# Patient Record
Sex: Male | Born: 1955 | Race: White | Hispanic: No | Marital: Single | Smoking: Never smoker
Health system: Southern US, Community
[De-identification: ages and names within clinical notes are randomized; demographics above are authoritative.]

## PROBLEM LIST (undated history)

## (undated) DIAGNOSIS — F39 Unspecified mood [affective] disorder: Secondary | ICD-10-CM

## (undated) DIAGNOSIS — F32A Depression, unspecified: Secondary | ICD-10-CM

## (undated) DIAGNOSIS — G809 Cerebral palsy, unspecified: Secondary | ICD-10-CM

## (undated) DIAGNOSIS — N319 Neuromuscular dysfunction of bladder, unspecified: Secondary | ICD-10-CM

## (undated) DIAGNOSIS — R32 Unspecified urinary incontinence: Secondary | ICD-10-CM

## (undated) DIAGNOSIS — Z86718 Personal history of other venous thrombosis and embolism: Secondary | ICD-10-CM

## (undated) DIAGNOSIS — F329 Major depressive disorder, single episode, unspecified: Secondary | ICD-10-CM

## (undated) DIAGNOSIS — K219 Gastro-esophageal reflux disease without esophagitis: Secondary | ICD-10-CM

## (undated) HISTORY — DX: Neuromuscular dysfunction of bladder, unspecified: N31.9

## (undated) HISTORY — DX: Depression, unspecified: F32.A

## (undated) HISTORY — DX: Unspecified urinary incontinence: R32

## (undated) HISTORY — DX: Major depressive disorder, single episode, unspecified: F32.9

## (undated) HISTORY — DX: Unspecified mood (affective) disorder: F39

## (undated) HISTORY — DX: Gastro-esophageal reflux disease without esophagitis: K21.9

## (undated) HISTORY — DX: Personal history of other venous thrombosis and embolism: Z86.718

---

## 2004-09-08 ENCOUNTER — Ambulatory Visit: Payer: Self-pay

## 2009-06-17 ENCOUNTER — Inpatient Hospital Stay: Payer: Self-pay | Admitting: Urology

## 2009-10-09 ENCOUNTER — Ambulatory Visit: Payer: Self-pay | Admitting: Urology

## 2010-02-03 ENCOUNTER — Ambulatory Visit: Payer: Self-pay | Admitting: Urology

## 2010-07-20 ENCOUNTER — Emergency Department: Payer: Self-pay | Admitting: Emergency Medicine

## 2010-08-06 ENCOUNTER — Observation Stay: Payer: Self-pay | Admitting: Internal Medicine

## 2010-08-17 ENCOUNTER — Inpatient Hospital Stay: Payer: Self-pay | Admitting: Orthopedic Surgery

## 2010-10-06 ENCOUNTER — Ambulatory Visit: Payer: Self-pay | Admitting: Vascular Surgery

## 2010-10-07 ENCOUNTER — Inpatient Hospital Stay: Payer: Self-pay | Admitting: Orthopedic Surgery

## 2011-05-06 ENCOUNTER — Ambulatory Visit: Payer: Self-pay | Admitting: Vascular Surgery

## 2011-05-20 ENCOUNTER — Emergency Department: Payer: Self-pay | Admitting: Emergency Medicine

## 2011-05-23 ENCOUNTER — Ambulatory Visit: Payer: Self-pay | Admitting: Urology

## 2011-05-23 ENCOUNTER — Inpatient Hospital Stay: Payer: Self-pay | Admitting: Internal Medicine

## 2011-05-26 HISTORY — PX: OTHER SURGICAL HISTORY: SHX169

## 2011-05-26 HISTORY — PX: HIP FRACTURE SURGERY: SHX118

## 2011-05-27 DIAGNOSIS — M542 Cervicalgia: Secondary | ICD-10-CM | POA: Diagnosis not present

## 2011-05-27 DIAGNOSIS — G809 Cerebral palsy, unspecified: Secondary | ICD-10-CM | POA: Diagnosis not present

## 2011-05-27 DIAGNOSIS — M47812 Spondylosis without myelopathy or radiculopathy, cervical region: Secondary | ICD-10-CM | POA: Diagnosis not present

## 2011-05-27 DIAGNOSIS — Z8672 Personal history of thrombophlebitis: Secondary | ICD-10-CM | POA: Diagnosis not present

## 2011-05-27 DIAGNOSIS — L8992 Pressure ulcer of unspecified site, stage 2: Secondary | ICD-10-CM | POA: Diagnosis not present

## 2011-05-27 DIAGNOSIS — L89309 Pressure ulcer of unspecified buttock, unspecified stage: Secondary | ICD-10-CM | POA: Diagnosis not present

## 2011-06-02 DIAGNOSIS — G809 Cerebral palsy, unspecified: Secondary | ICD-10-CM | POA: Diagnosis not present

## 2011-06-02 DIAGNOSIS — M62838 Other muscle spasm: Secondary | ICD-10-CM | POA: Diagnosis not present

## 2011-06-02 DIAGNOSIS — Z8672 Personal history of thrombophlebitis: Secondary | ICD-10-CM | POA: Diagnosis not present

## 2011-06-03 DIAGNOSIS — G809 Cerebral palsy, unspecified: Secondary | ICD-10-CM | POA: Diagnosis not present

## 2011-06-03 DIAGNOSIS — L8992 Pressure ulcer of unspecified site, stage 2: Secondary | ICD-10-CM | POA: Diagnosis not present

## 2011-06-03 DIAGNOSIS — Z8672 Personal history of thrombophlebitis: Secondary | ICD-10-CM | POA: Diagnosis not present

## 2011-06-03 DIAGNOSIS — M542 Cervicalgia: Secondary | ICD-10-CM | POA: Diagnosis not present

## 2011-06-03 DIAGNOSIS — M47812 Spondylosis without myelopathy or radiculopathy, cervical region: Secondary | ICD-10-CM | POA: Diagnosis not present

## 2011-06-03 DIAGNOSIS — L89309 Pressure ulcer of unspecified buttock, unspecified stage: Secondary | ICD-10-CM | POA: Diagnosis not present

## 2011-06-04 DIAGNOSIS — I82409 Acute embolism and thrombosis of unspecified deep veins of unspecified lower extremity: Secondary | ICD-10-CM | POA: Diagnosis not present

## 2011-06-04 DIAGNOSIS — M79609 Pain in unspecified limb: Secondary | ICD-10-CM | POA: Diagnosis not present

## 2011-06-05 DIAGNOSIS — L89309 Pressure ulcer of unspecified buttock, unspecified stage: Secondary | ICD-10-CM | POA: Diagnosis not present

## 2011-06-05 DIAGNOSIS — L8992 Pressure ulcer of unspecified site, stage 2: Secondary | ICD-10-CM | POA: Diagnosis not present

## 2011-06-05 DIAGNOSIS — M542 Cervicalgia: Secondary | ICD-10-CM | POA: Diagnosis not present

## 2011-06-05 DIAGNOSIS — G809 Cerebral palsy, unspecified: Secondary | ICD-10-CM | POA: Diagnosis not present

## 2011-06-05 DIAGNOSIS — M47812 Spondylosis without myelopathy or radiculopathy, cervical region: Secondary | ICD-10-CM | POA: Diagnosis not present

## 2011-06-05 DIAGNOSIS — Z8672 Personal history of thrombophlebitis: Secondary | ICD-10-CM | POA: Diagnosis not present

## 2011-06-09 DIAGNOSIS — M542 Cervicalgia: Secondary | ICD-10-CM | POA: Diagnosis not present

## 2011-06-09 DIAGNOSIS — L89309 Pressure ulcer of unspecified buttock, unspecified stage: Secondary | ICD-10-CM | POA: Diagnosis not present

## 2011-06-09 DIAGNOSIS — Z8672 Personal history of thrombophlebitis: Secondary | ICD-10-CM | POA: Diagnosis not present

## 2011-06-09 DIAGNOSIS — L8992 Pressure ulcer of unspecified site, stage 2: Secondary | ICD-10-CM | POA: Diagnosis not present

## 2011-06-09 DIAGNOSIS — G809 Cerebral palsy, unspecified: Secondary | ICD-10-CM | POA: Diagnosis not present

## 2011-06-09 DIAGNOSIS — M47812 Spondylosis without myelopathy or radiculopathy, cervical region: Secondary | ICD-10-CM | POA: Diagnosis not present

## 2011-06-10 ENCOUNTER — Ambulatory Visit: Payer: Self-pay | Admitting: Vascular Surgery

## 2011-06-10 DIAGNOSIS — M5030 Other cervical disc degeneration, unspecified cervical region: Secondary | ICD-10-CM | POA: Diagnosis not present

## 2011-06-11 DIAGNOSIS — L89309 Pressure ulcer of unspecified buttock, unspecified stage: Secondary | ICD-10-CM | POA: Diagnosis not present

## 2011-06-11 DIAGNOSIS — M542 Cervicalgia: Secondary | ICD-10-CM | POA: Diagnosis not present

## 2011-06-11 DIAGNOSIS — M47812 Spondylosis without myelopathy or radiculopathy, cervical region: Secondary | ICD-10-CM | POA: Diagnosis not present

## 2011-06-11 DIAGNOSIS — Z8672 Personal history of thrombophlebitis: Secondary | ICD-10-CM | POA: Diagnosis not present

## 2011-06-11 DIAGNOSIS — L8992 Pressure ulcer of unspecified site, stage 2: Secondary | ICD-10-CM | POA: Diagnosis not present

## 2011-06-11 DIAGNOSIS — G809 Cerebral palsy, unspecified: Secondary | ICD-10-CM | POA: Diagnosis not present

## 2011-06-15 DIAGNOSIS — M542 Cervicalgia: Secondary | ICD-10-CM | POA: Diagnosis not present

## 2011-06-15 DIAGNOSIS — L89309 Pressure ulcer of unspecified buttock, unspecified stage: Secondary | ICD-10-CM | POA: Diagnosis not present

## 2011-06-15 DIAGNOSIS — L8992 Pressure ulcer of unspecified site, stage 2: Secondary | ICD-10-CM | POA: Diagnosis not present

## 2011-06-15 DIAGNOSIS — M47812 Spondylosis without myelopathy or radiculopathy, cervical region: Secondary | ICD-10-CM | POA: Diagnosis not present

## 2011-06-15 DIAGNOSIS — Z8672 Personal history of thrombophlebitis: Secondary | ICD-10-CM | POA: Diagnosis not present

## 2011-06-15 DIAGNOSIS — G809 Cerebral palsy, unspecified: Secondary | ICD-10-CM | POA: Diagnosis not present

## 2011-06-17 DIAGNOSIS — Z8672 Personal history of thrombophlebitis: Secondary | ICD-10-CM | POA: Diagnosis not present

## 2011-06-17 DIAGNOSIS — M47812 Spondylosis without myelopathy or radiculopathy, cervical region: Secondary | ICD-10-CM | POA: Diagnosis not present

## 2011-06-17 DIAGNOSIS — M542 Cervicalgia: Secondary | ICD-10-CM | POA: Diagnosis not present

## 2011-06-17 DIAGNOSIS — G809 Cerebral palsy, unspecified: Secondary | ICD-10-CM | POA: Diagnosis not present

## 2011-06-17 DIAGNOSIS — L8992 Pressure ulcer of unspecified site, stage 2: Secondary | ICD-10-CM | POA: Diagnosis not present

## 2011-06-17 DIAGNOSIS — L89309 Pressure ulcer of unspecified buttock, unspecified stage: Secondary | ICD-10-CM | POA: Diagnosis not present

## 2011-06-19 DIAGNOSIS — M47812 Spondylosis without myelopathy or radiculopathy, cervical region: Secondary | ICD-10-CM | POA: Diagnosis not present

## 2011-06-19 DIAGNOSIS — G809 Cerebral palsy, unspecified: Secondary | ICD-10-CM | POA: Diagnosis not present

## 2011-06-19 DIAGNOSIS — L89309 Pressure ulcer of unspecified buttock, unspecified stage: Secondary | ICD-10-CM | POA: Diagnosis not present

## 2011-06-19 DIAGNOSIS — Z8672 Personal history of thrombophlebitis: Secondary | ICD-10-CM | POA: Diagnosis not present

## 2011-06-19 DIAGNOSIS — L8992 Pressure ulcer of unspecified site, stage 2: Secondary | ICD-10-CM | POA: Diagnosis not present

## 2011-06-19 DIAGNOSIS — M542 Cervicalgia: Secondary | ICD-10-CM | POA: Diagnosis not present

## 2011-06-23 DIAGNOSIS — Z8672 Personal history of thrombophlebitis: Secondary | ICD-10-CM | POA: Diagnosis not present

## 2011-06-23 DIAGNOSIS — M542 Cervicalgia: Secondary | ICD-10-CM | POA: Diagnosis not present

## 2011-06-23 DIAGNOSIS — L89309 Pressure ulcer of unspecified buttock, unspecified stage: Secondary | ICD-10-CM | POA: Diagnosis not present

## 2011-06-23 DIAGNOSIS — L8992 Pressure ulcer of unspecified site, stage 2: Secondary | ICD-10-CM | POA: Diagnosis not present

## 2011-06-23 DIAGNOSIS — G809 Cerebral palsy, unspecified: Secondary | ICD-10-CM | POA: Diagnosis not present

## 2011-06-23 DIAGNOSIS — M47812 Spondylosis without myelopathy or radiculopathy, cervical region: Secondary | ICD-10-CM | POA: Diagnosis not present

## 2011-06-24 DIAGNOSIS — G809 Cerebral palsy, unspecified: Secondary | ICD-10-CM | POA: Diagnosis not present

## 2011-06-24 DIAGNOSIS — L89309 Pressure ulcer of unspecified buttock, unspecified stage: Secondary | ICD-10-CM | POA: Diagnosis not present

## 2011-06-24 DIAGNOSIS — Z8672 Personal history of thrombophlebitis: Secondary | ICD-10-CM | POA: Diagnosis not present

## 2011-06-24 DIAGNOSIS — M47812 Spondylosis without myelopathy or radiculopathy, cervical region: Secondary | ICD-10-CM | POA: Diagnosis not present

## 2011-06-24 DIAGNOSIS — L8992 Pressure ulcer of unspecified site, stage 2: Secondary | ICD-10-CM | POA: Diagnosis not present

## 2011-06-24 DIAGNOSIS — M542 Cervicalgia: Secondary | ICD-10-CM | POA: Diagnosis not present

## 2011-06-26 DIAGNOSIS — Z8672 Personal history of thrombophlebitis: Secondary | ICD-10-CM | POA: Diagnosis not present

## 2011-06-26 DIAGNOSIS — L89309 Pressure ulcer of unspecified buttock, unspecified stage: Secondary | ICD-10-CM | POA: Diagnosis not present

## 2011-06-26 DIAGNOSIS — L8992 Pressure ulcer of unspecified site, stage 2: Secondary | ICD-10-CM | POA: Diagnosis not present

## 2011-06-26 DIAGNOSIS — G809 Cerebral palsy, unspecified: Secondary | ICD-10-CM | POA: Diagnosis not present

## 2011-06-26 DIAGNOSIS — M47812 Spondylosis without myelopathy or radiculopathy, cervical region: Secondary | ICD-10-CM | POA: Diagnosis not present

## 2011-06-26 DIAGNOSIS — M542 Cervicalgia: Secondary | ICD-10-CM | POA: Diagnosis not present

## 2011-06-29 DIAGNOSIS — Z8672 Personal history of thrombophlebitis: Secondary | ICD-10-CM | POA: Diagnosis not present

## 2011-06-29 DIAGNOSIS — M542 Cervicalgia: Secondary | ICD-10-CM | POA: Diagnosis not present

## 2011-06-29 DIAGNOSIS — M47812 Spondylosis without myelopathy or radiculopathy, cervical region: Secondary | ICD-10-CM | POA: Diagnosis not present

## 2011-06-29 DIAGNOSIS — L89309 Pressure ulcer of unspecified buttock, unspecified stage: Secondary | ICD-10-CM | POA: Diagnosis not present

## 2011-06-29 DIAGNOSIS — L8992 Pressure ulcer of unspecified site, stage 2: Secondary | ICD-10-CM | POA: Diagnosis not present

## 2011-06-29 DIAGNOSIS — G809 Cerebral palsy, unspecified: Secondary | ICD-10-CM | POA: Diagnosis not present

## 2011-06-30 DIAGNOSIS — L89309 Pressure ulcer of unspecified buttock, unspecified stage: Secondary | ICD-10-CM | POA: Diagnosis not present

## 2011-06-30 DIAGNOSIS — G809 Cerebral palsy, unspecified: Secondary | ICD-10-CM | POA: Diagnosis not present

## 2011-06-30 DIAGNOSIS — F39 Unspecified mood [affective] disorder: Secondary | ICD-10-CM | POA: Diagnosis not present

## 2011-06-30 DIAGNOSIS — K5900 Constipation, unspecified: Secondary | ICD-10-CM | POA: Diagnosis not present

## 2011-06-30 DIAGNOSIS — M25519 Pain in unspecified shoulder: Secondary | ICD-10-CM | POA: Diagnosis not present

## 2011-06-30 DIAGNOSIS — Z8672 Personal history of thrombophlebitis: Secondary | ICD-10-CM | POA: Diagnosis not present

## 2011-06-30 DIAGNOSIS — L8992 Pressure ulcer of unspecified site, stage 2: Secondary | ICD-10-CM | POA: Diagnosis not present

## 2011-06-30 DIAGNOSIS — M542 Cervicalgia: Secondary | ICD-10-CM | POA: Diagnosis not present

## 2011-06-30 DIAGNOSIS — M47812 Spondylosis without myelopathy or radiculopathy, cervical region: Secondary | ICD-10-CM | POA: Diagnosis not present

## 2011-07-01 DIAGNOSIS — Z8672 Personal history of thrombophlebitis: Secondary | ICD-10-CM | POA: Diagnosis not present

## 2011-07-01 DIAGNOSIS — L89309 Pressure ulcer of unspecified buttock, unspecified stage: Secondary | ICD-10-CM | POA: Diagnosis not present

## 2011-07-01 DIAGNOSIS — M47812 Spondylosis without myelopathy or radiculopathy, cervical region: Secondary | ICD-10-CM | POA: Diagnosis not present

## 2011-07-01 DIAGNOSIS — L8992 Pressure ulcer of unspecified site, stage 2: Secondary | ICD-10-CM | POA: Diagnosis not present

## 2011-07-01 DIAGNOSIS — G809 Cerebral palsy, unspecified: Secondary | ICD-10-CM | POA: Diagnosis not present

## 2011-07-01 DIAGNOSIS — M542 Cervicalgia: Secondary | ICD-10-CM | POA: Diagnosis not present

## 2011-07-16 DIAGNOSIS — N390 Urinary tract infection, site not specified: Secondary | ICD-10-CM | POA: Diagnosis not present

## 2011-09-29 DIAGNOSIS — K5900 Constipation, unspecified: Secondary | ICD-10-CM | POA: Diagnosis not present

## 2011-09-29 DIAGNOSIS — G809 Cerebral palsy, unspecified: Secondary | ICD-10-CM | POA: Diagnosis not present

## 2011-09-29 DIAGNOSIS — M62838 Other muscle spasm: Secondary | ICD-10-CM | POA: Diagnosis not present

## 2011-09-29 DIAGNOSIS — K029 Dental caries, unspecified: Secondary | ICD-10-CM | POA: Diagnosis not present

## 2011-11-04 DIAGNOSIS — N319 Neuromuscular dysfunction of bladder, unspecified: Secondary | ICD-10-CM | POA: Diagnosis not present

## 2012-01-11 ENCOUNTER — Emergency Department: Payer: Self-pay | Admitting: Emergency Medicine

## 2012-01-11 DIAGNOSIS — R509 Fever, unspecified: Secondary | ICD-10-CM | POA: Diagnosis not present

## 2012-01-11 DIAGNOSIS — N390 Urinary tract infection, site not specified: Secondary | ICD-10-CM | POA: Diagnosis not present

## 2012-01-11 DIAGNOSIS — R6889 Other general symptoms and signs: Secondary | ICD-10-CM | POA: Diagnosis not present

## 2012-01-11 DIAGNOSIS — R000 Tachycardia, unspecified: Secondary | ICD-10-CM | POA: Diagnosis not present

## 2012-01-11 LAB — CBC WITH DIFFERENTIAL/PLATELET
Basophil #: 0 10*3/uL (ref 0.0–0.1)
Basophil %: 0.4 %
Eosinophil #: 0 10*3/uL (ref 0.0–0.7)
HCT: 36.3 % — ABNORMAL LOW (ref 40.0–52.0)
Lymphocyte %: 7.3 %
MCHC: 33.6 g/dL (ref 32.0–36.0)
MCV: 88 fL (ref 80–100)
Monocyte #: 0.4 x10 3/mm (ref 0.2–1.0)
Monocyte %: 4 %
Neutrophil #: 8.9 10*3/uL — ABNORMAL HIGH (ref 1.4–6.5)
Neutrophil %: 88.2 %
RDW: 14 % (ref 11.5–14.5)

## 2012-01-11 LAB — COMPREHENSIVE METABOLIC PANEL
Albumin: 3.4 g/dL (ref 3.4–5.0)
Anion Gap: 8 (ref 7–16)
BUN: 20 mg/dL — ABNORMAL HIGH (ref 7–18)
Calcium, Total: 8.4 mg/dL — ABNORMAL LOW (ref 8.5–10.1)
Chloride: 106 mmol/L (ref 98–107)
Creatinine: 0.62 mg/dL (ref 0.60–1.30)
EGFR (African American): >60
Glucose: 120 mg/dL — ABNORMAL HIGH (ref 65–99)
Osmolality: 285 (ref 275–301)
Potassium: 3.9 mmol/L (ref 3.5–5.1)
Sodium: 141 mmol/L (ref 136–145)
Total Protein: 7 g/dL (ref 6.4–8.2)

## 2012-01-11 LAB — URINALYSIS, COMPLETE
Blood: NEGATIVE
Nitrite: POSITIVE
Ph: 5 (ref 4.5–8.0)
Protein: NEGATIVE
Specific Gravity: 1.017 (ref 1.003–1.030)
Squamous Epithelial: 4
WBC UR: 122 /HPF (ref 0–5)

## 2012-01-11 LAB — LIPASE, BLOOD: Lipase: 99 U/L (ref 73–393)

## 2012-01-13 DIAGNOSIS — Z8672 Personal history of thrombophlebitis: Secondary | ICD-10-CM | POA: Diagnosis not present

## 2012-01-13 DIAGNOSIS — G808 Other cerebral palsy: Secondary | ICD-10-CM | POA: Diagnosis not present

## 2012-01-13 DIAGNOSIS — I82509 Chronic embolism and thrombosis of unspecified deep veins of unspecified lower extremity: Secondary | ICD-10-CM | POA: Diagnosis not present

## 2012-01-13 DIAGNOSIS — F39 Unspecified mood [affective] disorder: Secondary | ICD-10-CM | POA: Diagnosis not present

## 2012-01-13 DIAGNOSIS — N390 Urinary tract infection, site not specified: Secondary | ICD-10-CM | POA: Diagnosis not present

## 2012-01-18 DIAGNOSIS — M24559 Contracture, unspecified hip: Secondary | ICD-10-CM | POA: Diagnosis not present

## 2012-01-18 DIAGNOSIS — M24569 Contracture, unspecified knee: Secondary | ICD-10-CM | POA: Diagnosis not present

## 2012-01-18 DIAGNOSIS — L89309 Pressure ulcer of unspecified buttock, unspecified stage: Secondary | ICD-10-CM | POA: Diagnosis not present

## 2012-01-18 DIAGNOSIS — N390 Urinary tract infection, site not specified: Secondary | ICD-10-CM | POA: Diagnosis not present

## 2012-01-18 DIAGNOSIS — A499 Bacterial infection, unspecified: Secondary | ICD-10-CM | POA: Diagnosis not present

## 2012-01-18 DIAGNOSIS — L8992 Pressure ulcer of unspecified site, stage 2: Secondary | ICD-10-CM | POA: Diagnosis not present

## 2012-01-22 DIAGNOSIS — L89309 Pressure ulcer of unspecified buttock, unspecified stage: Secondary | ICD-10-CM | POA: Diagnosis not present

## 2012-01-22 DIAGNOSIS — L8992 Pressure ulcer of unspecified site, stage 2: Secondary | ICD-10-CM | POA: Diagnosis not present

## 2012-01-22 DIAGNOSIS — N390 Urinary tract infection, site not specified: Secondary | ICD-10-CM | POA: Diagnosis not present

## 2012-01-22 DIAGNOSIS — M24569 Contracture, unspecified knee: Secondary | ICD-10-CM | POA: Diagnosis not present

## 2012-01-22 DIAGNOSIS — A499 Bacterial infection, unspecified: Secondary | ICD-10-CM | POA: Diagnosis not present

## 2012-01-22 DIAGNOSIS — M24559 Contracture, unspecified hip: Secondary | ICD-10-CM | POA: Diagnosis not present

## 2012-01-26 DIAGNOSIS — M24569 Contracture, unspecified knee: Secondary | ICD-10-CM | POA: Diagnosis not present

## 2012-01-26 DIAGNOSIS — L89309 Pressure ulcer of unspecified buttock, unspecified stage: Secondary | ICD-10-CM | POA: Diagnosis not present

## 2012-01-26 DIAGNOSIS — M24559 Contracture, unspecified hip: Secondary | ICD-10-CM | POA: Diagnosis not present

## 2012-01-26 DIAGNOSIS — A499 Bacterial infection, unspecified: Secondary | ICD-10-CM | POA: Diagnosis not present

## 2012-01-26 DIAGNOSIS — N390 Urinary tract infection, site not specified: Secondary | ICD-10-CM | POA: Diagnosis not present

## 2012-01-26 DIAGNOSIS — L8992 Pressure ulcer of unspecified site, stage 2: Secondary | ICD-10-CM | POA: Diagnosis not present

## 2012-02-01 DIAGNOSIS — A499 Bacterial infection, unspecified: Secondary | ICD-10-CM | POA: Diagnosis not present

## 2012-02-01 DIAGNOSIS — M24569 Contracture, unspecified knee: Secondary | ICD-10-CM | POA: Diagnosis not present

## 2012-02-01 DIAGNOSIS — L89309 Pressure ulcer of unspecified buttock, unspecified stage: Secondary | ICD-10-CM | POA: Diagnosis not present

## 2012-02-01 DIAGNOSIS — L8992 Pressure ulcer of unspecified site, stage 2: Secondary | ICD-10-CM | POA: Diagnosis not present

## 2012-02-01 DIAGNOSIS — N390 Urinary tract infection, site not specified: Secondary | ICD-10-CM | POA: Diagnosis not present

## 2012-02-01 DIAGNOSIS — M24559 Contracture, unspecified hip: Secondary | ICD-10-CM | POA: Diagnosis not present

## 2012-02-03 DIAGNOSIS — M24569 Contracture, unspecified knee: Secondary | ICD-10-CM | POA: Diagnosis not present

## 2012-02-03 DIAGNOSIS — L8992 Pressure ulcer of unspecified site, stage 2: Secondary | ICD-10-CM | POA: Diagnosis not present

## 2012-02-03 DIAGNOSIS — A499 Bacterial infection, unspecified: Secondary | ICD-10-CM | POA: Diagnosis not present

## 2012-02-03 DIAGNOSIS — M24559 Contracture, unspecified hip: Secondary | ICD-10-CM | POA: Diagnosis not present

## 2012-02-03 DIAGNOSIS — L89309 Pressure ulcer of unspecified buttock, unspecified stage: Secondary | ICD-10-CM | POA: Diagnosis not present

## 2012-02-03 DIAGNOSIS — N390 Urinary tract infection, site not specified: Secondary | ICD-10-CM | POA: Diagnosis not present

## 2012-02-05 DIAGNOSIS — L89309 Pressure ulcer of unspecified buttock, unspecified stage: Secondary | ICD-10-CM | POA: Diagnosis not present

## 2012-02-05 DIAGNOSIS — A499 Bacterial infection, unspecified: Secondary | ICD-10-CM | POA: Diagnosis not present

## 2012-02-05 DIAGNOSIS — M24559 Contracture, unspecified hip: Secondary | ICD-10-CM | POA: Diagnosis not present

## 2012-02-05 DIAGNOSIS — L8992 Pressure ulcer of unspecified site, stage 2: Secondary | ICD-10-CM | POA: Diagnosis not present

## 2012-02-05 DIAGNOSIS — M24569 Contracture, unspecified knee: Secondary | ICD-10-CM | POA: Diagnosis not present

## 2012-02-05 DIAGNOSIS — N390 Urinary tract infection, site not specified: Secondary | ICD-10-CM | POA: Diagnosis not present

## 2012-02-08 DIAGNOSIS — M24569 Contracture, unspecified knee: Secondary | ICD-10-CM | POA: Diagnosis not present

## 2012-02-08 DIAGNOSIS — A499 Bacterial infection, unspecified: Secondary | ICD-10-CM | POA: Diagnosis not present

## 2012-02-08 DIAGNOSIS — L89309 Pressure ulcer of unspecified buttock, unspecified stage: Secondary | ICD-10-CM | POA: Diagnosis not present

## 2012-02-08 DIAGNOSIS — L8992 Pressure ulcer of unspecified site, stage 2: Secondary | ICD-10-CM | POA: Diagnosis not present

## 2012-02-08 DIAGNOSIS — M24559 Contracture, unspecified hip: Secondary | ICD-10-CM | POA: Diagnosis not present

## 2012-02-08 DIAGNOSIS — N390 Urinary tract infection, site not specified: Secondary | ICD-10-CM | POA: Diagnosis not present

## 2012-02-10 DIAGNOSIS — M24559 Contracture, unspecified hip: Secondary | ICD-10-CM | POA: Diagnosis not present

## 2012-02-10 DIAGNOSIS — A499 Bacterial infection, unspecified: Secondary | ICD-10-CM | POA: Diagnosis not present

## 2012-02-10 DIAGNOSIS — L89309 Pressure ulcer of unspecified buttock, unspecified stage: Secondary | ICD-10-CM | POA: Diagnosis not present

## 2012-02-10 DIAGNOSIS — N390 Urinary tract infection, site not specified: Secondary | ICD-10-CM | POA: Diagnosis not present

## 2012-02-10 DIAGNOSIS — L8992 Pressure ulcer of unspecified site, stage 2: Secondary | ICD-10-CM | POA: Diagnosis not present

## 2012-02-10 DIAGNOSIS — M24569 Contracture, unspecified knee: Secondary | ICD-10-CM | POA: Diagnosis not present

## 2012-02-11 DIAGNOSIS — M24569 Contracture, unspecified knee: Secondary | ICD-10-CM | POA: Diagnosis not present

## 2012-02-11 DIAGNOSIS — L8992 Pressure ulcer of unspecified site, stage 2: Secondary | ICD-10-CM | POA: Diagnosis not present

## 2012-02-11 DIAGNOSIS — A499 Bacterial infection, unspecified: Secondary | ICD-10-CM | POA: Diagnosis not present

## 2012-02-11 DIAGNOSIS — L89309 Pressure ulcer of unspecified buttock, unspecified stage: Secondary | ICD-10-CM | POA: Diagnosis not present

## 2012-02-11 DIAGNOSIS — M24559 Contracture, unspecified hip: Secondary | ICD-10-CM | POA: Diagnosis not present

## 2012-02-11 DIAGNOSIS — N390 Urinary tract infection, site not specified: Secondary | ICD-10-CM | POA: Diagnosis not present

## 2012-02-15 DIAGNOSIS — A499 Bacterial infection, unspecified: Secondary | ICD-10-CM | POA: Diagnosis not present

## 2012-02-15 DIAGNOSIS — N390 Urinary tract infection, site not specified: Secondary | ICD-10-CM | POA: Diagnosis not present

## 2012-02-15 DIAGNOSIS — M24559 Contracture, unspecified hip: Secondary | ICD-10-CM | POA: Diagnosis not present

## 2012-02-15 DIAGNOSIS — M24569 Contracture, unspecified knee: Secondary | ICD-10-CM | POA: Diagnosis not present

## 2012-02-15 DIAGNOSIS — L89309 Pressure ulcer of unspecified buttock, unspecified stage: Secondary | ICD-10-CM | POA: Diagnosis not present

## 2012-02-15 DIAGNOSIS — L8992 Pressure ulcer of unspecified site, stage 2: Secondary | ICD-10-CM | POA: Diagnosis not present

## 2012-02-17 DIAGNOSIS — L8992 Pressure ulcer of unspecified site, stage 2: Secondary | ICD-10-CM | POA: Diagnosis not present

## 2012-02-17 DIAGNOSIS — M24569 Contracture, unspecified knee: Secondary | ICD-10-CM | POA: Diagnosis not present

## 2012-02-17 DIAGNOSIS — M24559 Contracture, unspecified hip: Secondary | ICD-10-CM | POA: Diagnosis not present

## 2012-02-17 DIAGNOSIS — A499 Bacterial infection, unspecified: Secondary | ICD-10-CM | POA: Diagnosis not present

## 2012-02-17 DIAGNOSIS — N390 Urinary tract infection, site not specified: Secondary | ICD-10-CM | POA: Diagnosis not present

## 2012-02-17 DIAGNOSIS — L89309 Pressure ulcer of unspecified buttock, unspecified stage: Secondary | ICD-10-CM | POA: Diagnosis not present

## 2012-02-19 DIAGNOSIS — N390 Urinary tract infection, site not specified: Secondary | ICD-10-CM | POA: Diagnosis not present

## 2012-02-19 DIAGNOSIS — L89309 Pressure ulcer of unspecified buttock, unspecified stage: Secondary | ICD-10-CM | POA: Diagnosis not present

## 2012-02-19 DIAGNOSIS — M24569 Contracture, unspecified knee: Secondary | ICD-10-CM | POA: Diagnosis not present

## 2012-02-19 DIAGNOSIS — A499 Bacterial infection, unspecified: Secondary | ICD-10-CM | POA: Diagnosis not present

## 2012-02-19 DIAGNOSIS — M24559 Contracture, unspecified hip: Secondary | ICD-10-CM | POA: Diagnosis not present

## 2012-02-19 DIAGNOSIS — L8992 Pressure ulcer of unspecified site, stage 2: Secondary | ICD-10-CM | POA: Diagnosis not present

## 2012-02-22 DIAGNOSIS — L89309 Pressure ulcer of unspecified buttock, unspecified stage: Secondary | ICD-10-CM | POA: Diagnosis not present

## 2012-02-22 DIAGNOSIS — M24569 Contracture, unspecified knee: Secondary | ICD-10-CM | POA: Diagnosis not present

## 2012-02-22 DIAGNOSIS — M24559 Contracture, unspecified hip: Secondary | ICD-10-CM | POA: Diagnosis not present

## 2012-02-22 DIAGNOSIS — N390 Urinary tract infection, site not specified: Secondary | ICD-10-CM | POA: Diagnosis not present

## 2012-02-22 DIAGNOSIS — L8992 Pressure ulcer of unspecified site, stage 2: Secondary | ICD-10-CM | POA: Diagnosis not present

## 2012-02-22 DIAGNOSIS — A499 Bacterial infection, unspecified: Secondary | ICD-10-CM | POA: Diagnosis not present

## 2012-02-23 DIAGNOSIS — L89309 Pressure ulcer of unspecified buttock, unspecified stage: Secondary | ICD-10-CM | POA: Diagnosis not present

## 2012-02-23 DIAGNOSIS — N390 Urinary tract infection, site not specified: Secondary | ICD-10-CM | POA: Diagnosis not present

## 2012-02-23 DIAGNOSIS — M24569 Contracture, unspecified knee: Secondary | ICD-10-CM | POA: Diagnosis not present

## 2012-02-23 DIAGNOSIS — M24559 Contracture, unspecified hip: Secondary | ICD-10-CM | POA: Diagnosis not present

## 2012-02-23 DIAGNOSIS — A499 Bacterial infection, unspecified: Secondary | ICD-10-CM | POA: Diagnosis not present

## 2012-02-23 DIAGNOSIS — L8992 Pressure ulcer of unspecified site, stage 2: Secondary | ICD-10-CM | POA: Diagnosis not present

## 2012-02-26 DIAGNOSIS — L8992 Pressure ulcer of unspecified site, stage 2: Secondary | ICD-10-CM | POA: Diagnosis not present

## 2012-02-26 DIAGNOSIS — A499 Bacterial infection, unspecified: Secondary | ICD-10-CM | POA: Diagnosis not present

## 2012-02-26 DIAGNOSIS — L89309 Pressure ulcer of unspecified buttock, unspecified stage: Secondary | ICD-10-CM | POA: Diagnosis not present

## 2012-02-26 DIAGNOSIS — M24569 Contracture, unspecified knee: Secondary | ICD-10-CM | POA: Diagnosis not present

## 2012-02-26 DIAGNOSIS — N390 Urinary tract infection, site not specified: Secondary | ICD-10-CM | POA: Diagnosis not present

## 2012-02-26 DIAGNOSIS — M24559 Contracture, unspecified hip: Secondary | ICD-10-CM | POA: Diagnosis not present

## 2012-04-05 DIAGNOSIS — Z8672 Personal history of thrombophlebitis: Secondary | ICD-10-CM | POA: Diagnosis not present

## 2012-04-05 DIAGNOSIS — Z742 Need for assistance at home and no other household member able to render care: Secondary | ICD-10-CM | POA: Diagnosis not present

## 2012-04-06 DIAGNOSIS — N390 Urinary tract infection, site not specified: Secondary | ICD-10-CM | POA: Diagnosis not present

## 2012-06-02 DIAGNOSIS — F39 Unspecified mood [affective] disorder: Secondary | ICD-10-CM | POA: Diagnosis not present

## 2012-06-02 DIAGNOSIS — Z8672 Personal history of thrombophlebitis: Secondary | ICD-10-CM | POA: Diagnosis not present

## 2012-06-02 DIAGNOSIS — Z7901 Long term (current) use of anticoagulants: Secondary | ICD-10-CM | POA: Diagnosis not present

## 2012-06-02 DIAGNOSIS — Z131 Encounter for screening for diabetes mellitus: Secondary | ICD-10-CM | POA: Diagnosis not present

## 2012-09-05 DIAGNOSIS — N319 Neuromuscular dysfunction of bladder, unspecified: Secondary | ICD-10-CM | POA: Diagnosis not present

## 2012-09-05 DIAGNOSIS — F39 Unspecified mood [affective] disorder: Secondary | ICD-10-CM | POA: Diagnosis not present

## 2012-10-05 DIAGNOSIS — H9210 Otorrhea, unspecified ear: Secondary | ICD-10-CM | POA: Diagnosis not present

## 2012-10-13 DIAGNOSIS — H6120 Impacted cerumen, unspecified ear: Secondary | ICD-10-CM | POA: Diagnosis not present

## 2012-10-13 DIAGNOSIS — H60509 Unspecified acute noninfective otitis externa, unspecified ear: Secondary | ICD-10-CM | POA: Diagnosis not present

## 2012-10-21 DIAGNOSIS — H60509 Unspecified acute noninfective otitis externa, unspecified ear: Secondary | ICD-10-CM | POA: Diagnosis not present

## 2012-10-21 DIAGNOSIS — H6120 Impacted cerumen, unspecified ear: Secondary | ICD-10-CM | POA: Diagnosis not present

## 2012-11-09 DIAGNOSIS — N319 Neuromuscular dysfunction of bladder, unspecified: Secondary | ICD-10-CM | POA: Diagnosis not present

## 2012-11-14 DIAGNOSIS — N390 Urinary tract infection, site not specified: Secondary | ICD-10-CM | POA: Diagnosis not present

## 2012-11-14 DIAGNOSIS — N319 Neuromuscular dysfunction of bladder, unspecified: Secondary | ICD-10-CM | POA: Diagnosis not present

## 2012-12-14 DIAGNOSIS — R51 Headache: Secondary | ICD-10-CM | POA: Diagnosis not present

## 2012-12-14 DIAGNOSIS — F39 Unspecified mood [affective] disorder: Secondary | ICD-10-CM | POA: Diagnosis not present

## 2012-12-14 DIAGNOSIS — G809 Cerebral palsy, unspecified: Secondary | ICD-10-CM | POA: Diagnosis not present

## 2013-03-22 DIAGNOSIS — G809 Cerebral palsy, unspecified: Secondary | ICD-10-CM | POA: Diagnosis not present

## 2013-03-22 DIAGNOSIS — Z8672 Personal history of thrombophlebitis: Secondary | ICD-10-CM | POA: Diagnosis not present

## 2013-03-22 DIAGNOSIS — Z23 Encounter for immunization: Secondary | ICD-10-CM | POA: Diagnosis not present

## 2013-03-22 DIAGNOSIS — F39 Unspecified mood [affective] disorder: Secondary | ICD-10-CM | POA: Diagnosis not present

## 2013-09-20 DIAGNOSIS — F39 Unspecified mood [affective] disorder: Secondary | ICD-10-CM | POA: Diagnosis not present

## 2013-12-27 DIAGNOSIS — R82998 Other abnormal findings in urine: Secondary | ICD-10-CM | POA: Diagnosis not present

## 2013-12-27 DIAGNOSIS — N319 Neuromuscular dysfunction of bladder, unspecified: Secondary | ICD-10-CM | POA: Diagnosis not present

## 2013-12-27 DIAGNOSIS — F39 Unspecified mood [affective] disorder: Secondary | ICD-10-CM | POA: Diagnosis not present

## 2013-12-29 DIAGNOSIS — R82998 Other abnormal findings in urine: Secondary | ICD-10-CM | POA: Diagnosis not present

## 2014-01-25 ENCOUNTER — Emergency Department: Payer: Self-pay | Admitting: Emergency Medicine

## 2014-01-25 DIAGNOSIS — F3289 Other specified depressive episodes: Secondary | ICD-10-CM | POA: Diagnosis not present

## 2014-01-25 DIAGNOSIS — R58 Hemorrhage, not elsewhere classified: Secondary | ICD-10-CM | POA: Diagnosis not present

## 2014-01-25 DIAGNOSIS — F411 Generalized anxiety disorder: Secondary | ICD-10-CM | POA: Diagnosis not present

## 2014-01-25 DIAGNOSIS — R079 Chest pain, unspecified: Secondary | ICD-10-CM | POA: Diagnosis not present

## 2014-01-25 DIAGNOSIS — H7290 Unspecified perforation of tympanic membrane, unspecified ear: Secondary | ICD-10-CM | POA: Diagnosis not present

## 2014-01-25 DIAGNOSIS — H9209 Otalgia, unspecified ear: Secondary | ICD-10-CM | POA: Diagnosis not present

## 2014-01-25 DIAGNOSIS — F329 Major depressive disorder, single episode, unspecified: Secondary | ICD-10-CM | POA: Diagnosis not present

## 2014-01-25 LAB — CBC
HCT: 40.6 % (ref 40.0–52.0)
HGB: 13.4 g/dL (ref 13.0–18.0)
MCH: 29.5 pg (ref 26.0–34.0)
MCHC: 33 g/dL (ref 32.0–36.0)
MCV: 89 fL (ref 80–100)
Platelet: 150 10*3/uL (ref 150–440)
RBC: 4.55 10*6/uL (ref 4.40–5.90)
RDW: 13.7 % (ref 11.5–14.5)
WBC: 7.8 10*3/uL (ref 3.8–10.6)

## 2014-01-25 LAB — COMPREHENSIVE METABOLIC PANEL
ALK PHOS: 92 U/L
ALT: 24 U/L
ANION GAP: 8 (ref 7–16)
Albumin: 3.6 g/dL (ref 3.4–5.0)
BUN: 15 mg/dL (ref 7–18)
Bilirubin,Total: 0.2 mg/dL (ref 0.2–1.0)
CHLORIDE: 109 mmol/L — AB (ref 98–107)
Calcium, Total: 8.1 mg/dL — ABNORMAL LOW (ref 8.5–10.1)
Co2: 25 mmol/L (ref 21–32)
Creatinine: 0.65 mg/dL (ref 0.60–1.30)
EGFR (African American): >60
Glucose: 123 mg/dL — ABNORMAL HIGH (ref 65–99)
OSMOLALITY: 285 (ref 275–301)
POTASSIUM: 3.7 mmol/L (ref 3.5–5.1)
SGOT(AST): 14 U/L — ABNORMAL LOW (ref 15–37)
SODIUM: 142 mmol/L (ref 136–145)
TOTAL PROTEIN: 6.9 g/dL (ref 6.4–8.2)

## 2014-01-25 LAB — CK TOTAL AND CKMB (NOT AT ARMC)
CK, Total: 48 U/L
CK-MB: 1.1 ng/mL (ref 0.5–3.6)

## 2014-01-25 LAB — TROPONIN I: Troponin-I: <0.02 ng/mL

## 2014-01-30 DIAGNOSIS — H6120 Impacted cerumen, unspecified ear: Secondary | ICD-10-CM | POA: Diagnosis not present

## 2014-01-30 DIAGNOSIS — H60509 Unspecified acute noninfective otitis externa, unspecified ear: Secondary | ICD-10-CM | POA: Diagnosis not present

## 2014-01-30 DIAGNOSIS — H9209 Otalgia, unspecified ear: Secondary | ICD-10-CM | POA: Diagnosis not present

## 2014-03-17 DIAGNOSIS — Z23 Encounter for immunization: Secondary | ICD-10-CM | POA: Diagnosis not present

## 2014-06-06 DIAGNOSIS — N390 Urinary tract infection, site not specified: Secondary | ICD-10-CM | POA: Diagnosis not present

## 2014-06-22 DIAGNOSIS — N390 Urinary tract infection, site not specified: Secondary | ICD-10-CM | POA: Diagnosis not present

## 2014-07-17 DIAGNOSIS — Z7901 Long term (current) use of anticoagulants: Secondary | ICD-10-CM | POA: Diagnosis not present

## 2014-08-24 DIAGNOSIS — Z7901 Long term (current) use of anticoagulants: Secondary | ICD-10-CM | POA: Diagnosis not present

## 2014-08-24 DIAGNOSIS — Z87440 Personal history of urinary (tract) infections: Secondary | ICD-10-CM | POA: Diagnosis not present

## 2014-09-16 NOTE — Op Note (Signed)
PATIENT NAME:  Gerald Montgomery, Gerald Montgomery MR#:  [id number] DATE OF BIRTH:  1955-06-07  DATE OF PROCEDURE:  05/06/2011  PREOPERATIVE DIAGNOSES:  1. Previous deep vein thrombosis and femur fracture.  2. Status post IVC filter placement.  3. Cerebral palsy.   POSTOPERATIVE DIAGNOSES:  1. Previous deep vein thrombosis and femur fracture.  2. Status post IVC filter placement.  3. Cerebral palsy.   PROCEDURES PERFORMED: 1. Ultrasound guidance for vascular access, right jugular vein.  2. Placement of catheter into inferior vena cava from right jugular approach.  3. Inferior venacavogram.  4. Retrieval of IVC filter.   SURGEON: Algernon Huxley, M.D.   ANESTHESIA: Local with moderate conscious sedation.   ESTIMATED BLOOD LOSS: Minimal.   FLUOROSCOPY TIME: Approximately one minute.   CONTRAST USED: 15 mL.  INDICATION FOR PROCEDURE: This is a 59 year old male with cerebral palsy who had an IVC filter placed for a femur fracture with previous deep vein thrombosis and high risk for thromboembolic complications. He is back to have this removed. The risks and benefits were discussed and informed consent was obtained.   DESCRIPTION OF PROCEDURE: The patient was brought to the vascular interventional radiology suite. The right neck and chest were sterilely prepped and draped and a sterile surgical field was created. The right jugular vein was visualized with ultrasound and found to widely patent. It was then accessed under direct ultrasound guidance without difficulty with a Seldinger needle. A three J-wire was placed. After skin nick and dilatation the sheath was placed into the inferior vena cava and inferior venacavogram was then performed. This demonstrated a patent vena cava with the filter in normal location. The delivery snare was then used to grasp the hook and stop the filter without difficulty and the filter was collapsed into the sheath and removed in its entirety. The sheath was removed, pressure was  held, and a sterile dressing was placed. The patient tolerated the procedure well. ____________________________ Algernon Huxley, MD jsd:slb D: 05/06/2011 12:09:17 ET T: 05/06/2011 12:50:27 ET JOB#: [id number]  cc: Algernon Huxley, MD, <Dictator> Algernon Huxley MD ELECTRONICALLY SIGNED 06/01/2011 8:57

## 2014-09-16 NOTE — Discharge Summary (Signed)
PATIENT NAME:  Gerald Montgomery, Gerald Montgomery MR#:  [id number] DATE OF BIRTH:  Jul 21, 1955  DATE OF ADMISSION:  05/23/2011 DATE OF DISCHARGE:  05/25/2011  ADMITTING PHYSICIAN: Wilfred Curtis, MD  DISCHARGING PHYSICIAN: Gladstone Lighter, MD  PRIMARY CARE PHYSICIAN: Audley Hose, MD  CONSULTANTS:  Delma Officer, MD - Urology  DISCHARGE DIAGNOSES:  1. Gross hematuria secondary to coagulopathy.  2. 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  3. Acute on chronic anemia, which is stable now. He did not require any transfusion this admission.  4. History of lower extremity deep vein thrombosis. 5. History of hip replacement twice. 6. Cerebral palsy and mental retardation.  7. History of recurrent urinary tract infections. 8. Headache and cervicalgia. 9. Elevated liver function tests during hospitalization, hepatitis panel negative, slowly improving now.   DISCHARGE MEDICATIONS:  1. Zofran 4 mg p.o. every six hours p.r.n.  2. Abilify 10 mg p.o. daily.  3. [name] 50 mg p.o. three times daily. 4. Skelaxin 400 to 800 mg p.o. every six hours p.r.n.  5. Norco 5/325 mg 1 to 2 tablets p.o. every 6 hours p.r.n.  6. Fluoxetine 40 mg p.o. daily.   DISCHARGE DIET: Regular diet.   DISCHARGE ACTIVITY: As tolerated.    FOLLOWUP INSTRUCTIONS:  1. Follow up with primary care physician in 1 to 2 weeks.  2. Resume home health.  3. Urology follow-up in two weeks.   LABS AND IMAGING STUDIES: WBC 8.1, hemoglobin 10.0, hematocrit 29.4, and platelet count 352.   INR prior to discharge 1.3.  Total bilirubin 0.3, alkaline phosphatase 180, ALT 96, and AST 34 as compared to alkaline phosphatase of 278 with ALT of 129 and AST of 51 on admission.  Urine culture is negative.   Urinalysis is negative for infection.   Hepatitis panel is negative.   Sodium 138, potassium 3.8, chloride 99, bicarbonate 27, BUN 16, creatinine 0.48, glucose 116, and calcium 8.6.   CT of the head without contrast done for headaches  is showing no acute intracranial process. 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 No evidence of space-occupying lesion, intracranial hemorrhage, or acute infarction.   Chest x-ray is showing no acute disease of the chest.   Ultrasound of the kidneys bilaterally is showing normal renal ultrasound. Foley catheter present in a decompressed bladder. Kidneys are normal without any hydronephrosis.   Ultrasound of the abdomen is showing no cholelithiasis or sonographic evidence of acute cholecystitis. There is no biliary sludge or intra or extrahepatic ductal dilatation. There is no gallbladder wall thickening.   BRIEF HOSPITAL COURSE: Mr. Snellgrove is a 59 year old male with history of cerebral palsy and mental retardation who lives at home being taken care of by his mom. He was brought to the hospital secondary to ongoing hematuria. He was in the emergency room a couple of days ago for gross hematuria and was discharged back home. He comes back with similar complaints. His INR was as high as 4.6 where as it was greater than 6 when he had his initial ER visit. Also his hemoglobin has dropped from 13 from two days ago during the ER visit to 11 on this admission so he was admitted for acute on chronic anemia and also gross hematuria.  1. Gross hematuria secondary to Coumadin toxicity secondary to being started on Bactrim recently for urinary tract infection: Bactrim has been stopped. He was given vitamin K and Coumadin was obviously stopped. His hematuria has cleared and his Foley was taken out on the day of discharge. The  patient voided after the Foley was taken out. His post void residual was less than 100 mL. He was seen by urologist, Dr. Maudie Mercury, who was initially planning on doing a cystoscopy, but since the patient's hematuria was clearing once the INR was normalized, he advised to followup with the patient's primary urologist, Dr. Maryan Puls, as an outpatient. The patient had a prior cystoscopy last year, in  January 2011, which was which was normal except for pyuria at the time. 2. Acute on chronic anemia: His hemoglobin dropped initially and then his hemoglobin stabilized at 10. He does have history of chronic anemia secondary to chronic illness and his baseline hemoglobin runs anywhere between 8.5 to 10 and his hematocrit stays around 28 to 29. So he did not require any blood transfusion this admission.  3. History of lower extremity deep vein thrombosis, likely from immobilization: He is mostly bedbound. His Coumadin has been off because of the gross hematuria and drop in hemoglobin. He was advised to check back with his PCP whether to restart the Coumadin or not as his mother is totally opposed to the plan of restarting Coumadin. He had a prior IVC filter placed in but that was retrieved earlier this year.  4. 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 CT of the head was negative for any bleed, especially with his high INR. He was started on Norco p.r.n. and Skelaxin which seemed to be helping him in the hospital.  5. Elevated liver function tests, probably acutely elevated with all the bleeding and high INR because ultrasound has remained negative and his hepatitis panel was negative, and they have trended down almost close to normal by the time of discharge.   CODE STATUS: FULL CODE. His overall course has been otherwise uneventful while in the hospital.   DISCHARGE CONDITION: Stable.        DISCHARGE DISPOSITION: Home with home health.   TIME SPENT ON DISCHARGE: 40 minutes.  ____________________________ Gladstone Lighter, MD rk:slb D: 05/25/2011 15:23:06 ET T: 05/28/2011 11:06:12 ET JOB#: [id number]  cc: Gladstone Lighter, MD, <Dictator> Doylene Bode, MD Gladstone Lighter MD ELECTRONICALLY SIGNED 05/31/2011 13:37

## 2014-09-16 NOTE — Consult Note (Signed)
PATIENT NAME:  Gerald Montgomery, Gerald Montgomery MR#:  [id number] DATE OF BIRTH:  05-19-1956  DATE OF CONSULTATION:  05/23/2011  REFERRING PHYSICIAN:  Wilfred Curtis, MD  CONSULTING PHYSICIAN:  Darcella Cheshire, MD  REASON FOR CONSULTATION: Gross hematuria.   The source of the history is from the patient's family and the patient's medical record as the patient has cerebral palsy with mental retardation.   HISTORY OF PRESENT ILLNESS: The patient is a 59 year old white male with a history of cerebral palsy and mental retardation. The patient was diagnosed with a urinary tract infection on 05/11/2011 and was placed on Septra DS by his primary care physician. The patient has a history of frequent urinary tract infections. Most recently, the symptoms of the urinary tract infection were dark and malodorous urine with a low-grade fever. There also apparently was some mild gross hematuria as well. Shortly after starting the Septra, the patient developed progressive gross hematuria culminating in a visit to the [facility] Emergency Department on 05/20/2011 with bleeding from his gums and gross hematuria. At that time, the patient's hematocrit was 39.9%. His INR was 4.6 and his APTT was also elevated at 129. The patient was changed from Septra to [facility] and was instructed to discontinue his Coumadin for two days and was discharged to home. The patient represented to the [facility] Emergency Department on 05/22/2011 with persistent gross hematuria. Evaluation in the Emergency Department revealed a hematocrit of 32.7 and an INR of 2. The patient also had elevated liver transaminases as well. In the Emergency Department, the patient underwent a head CT for complaints of a headache which was within normal limits without evidence for intracranial bleed. The patient also had an abdominal ultrasound which did not show any evidence for cholelithiasis or cholecystitis. The patient also had a renal  ultrasound which was also within normal limits without evidence for hydronephrosis, stones, or renal masses. A 16 French Foley catheter was placed in the Emergency Department which returned with dark grossly bloody urine. The patient was admitted for observation given the drop in the hematocrit with the persistent gross hematuria.   GENITOURINARY HISTORY: The patient is an established patient of Dr. Maryan Puls and review of the hospital records reveals a flexible cystoscopy under anesthesia on 02/12/2010 for gross hematuria which revealed a normal bladder with the exception of a pyocystis. The patient had also been admitted back in January of 2011 for intravenous antibiotics for the treatment of a right epididymal orchitis. The family reports that he is being followed by Dr. Yves Dill for functional urinary incontinence. The patient is on bethanechol. The family does not know if the patient empties his bladder efficiently. The family denies any urinary frequency but does endorse nocturia. The patient voids into a urinal in the daytime and wears a diaper at night.   REVIEW OF SYSTEMS: A 12 system review of systems was negative except for as outlined in the history of present illness and as documented in the patient's admission history and physical examination by Dr. Lenore Manner.   PAST MEDICAL HISTORY: 1. Cerebral palsy. 2. Mental retardation. 3. Deep vein thrombosis status post orthopedic surgery in May 2012. 4. Status post IVC filter placement with subsequent retrieval. 5. Status post left femoral shaft periprosthetic fracture May 2012. 6. History of recurrent urinary tract infections. 7. History of anxiety and depression. 8. History of multiple tendon release surgeries. 9. History of left open reduction and internal fixation.  HABITS: The patient does not smoke  or drink alcohol.   SOCIAL HISTORY: The patient lives at home and is under the care of his mother.   FAMILY HISTORY: Significant for  diabetes and coronary artery disease.   ALLERGIES: Phenergan causes hallucinations. Penicillin causes a skin rash. Sulfa causes a skin rash.  ADMISSION MEDICATIONS: 1. Abilify 10 mg p.o. daily. 2. [name] 50 mg p.o. t.i.d. 3. Fluoxetine 40 mg once a day. 4. Aspirin 81 mg once a day. 5. Coumadin 5 mg once a day; Coumadin held on 05/20/2011.  6. Ondansetron 4 mg p.o. q.6 hours p.r.n. 7. Nitrofurantoin p.o. b.i.d.    PHYSICAL EXAMINATION:  VITAL SIGNS: Temperature 98.2 degrees Fahrenheit, pulse 85 and regular, respirations 18 and unlabored, blood pressure 135/72, oxygen saturation 98% on room air.  GENERAL: The patient is a well developed white male in no apparent distress.  HEENT: Normocephalic, atraumatic. Anicteric.   NECK: No masses. No bruits.  CHEST: Clear to auscultation with normal respiratory effort.  CARDIAC: Regular rate and rhythm without murmurs, gallops, or rubs. 1+ radial and carotid pulses bilaterally.   ABDOMEN: Nontender, nondistended with normoactive bowel sounds. No costovertebral angle tenderness or suprapubic tenderness.   GENITOURINARY: Normal circumcised phallus with a 25 French Foley catheter in place. Bilaterally descended testes. The 20 French Foley catheter is draining clear amber urine. Nontender without masses.   RECTAL: Deferred.  SKIN: No rashes or lesions about the head, face, and neck.  PSYCHIATRIC: The patient is alert, pleasant, responsive, and oriented to person and place but is disoriented with regard to the year and the purpose of his hospitalization.   LABORATORY, DIAGNOSTIC, AND RADIOLOGICAL DATA: Glucose 116, BUN 16, creatinine 0.48, sodium 138, potassium 3.8, bilirubin 0.4, alkaline phosphatase 250, AST 170, ALT 197. Troponin less than 0.02. White blood cell count 7000, hematocrit 32.7, platelet count 386. PT 22. INR 2. Urinalysis revealed turbid red urine with 4220 red cells per high-power field and 6 white blood cells per high-power  field.   ASSESSMENT:  1. Gross hematuria with acute blood loss anemia. The gross hematuria has resolved with holding of the Coumadin. The gross hematuria appears to have developed with a urinary tract infection approximately two weeks ago and was exacerbated by an increase in the patient's INR while on Septra and Coumadin. Upper tract imaging with renal ultrasonography was within normal limits in the Emergency Department and lower tract evaluation with cystoscopy back in September of 2011 with Dr. Maryan Puls was within normal limits. Acutely, there is no indication for urologic intervention. We will defer the decision for more complete genitourinary evaluation for the gross hematuria to his established urologist, Dr. Maryan Puls as an outpatient.  2. Functional urinary incontinence. This is due to the patient's cerebral palsy and mental retardation and is managed by his established urologist, Dr. Maryan Puls. The patient has been on bethanechol chronically per the patient's family.   RECOMMENDATIONS: 1. Continue close monitoring of the patient's urine with Foley catheter. 2. Continue close monitoring of the patient's vital signs and hematocrit. If this remains stable, consider a voiding trial in the morning and confirm adequate voiding efficiency with a bladder scan postvoid residual.   ____________________________ Darcella Cheshire, MD jhk:drc D: 05/23/2011 12:07:00 ET T: 05/23/2011 12:21:32 ET JOB#: [id number]  cc: Darcella Cheshire, MD, <Dictator> Darcella Cheshire MD ELECTRONICALLY SIGNED 07/10/2011 15:55

## 2014-10-08 DIAGNOSIS — R32 Unspecified urinary incontinence: Secondary | ICD-10-CM | POA: Diagnosis not present

## 2014-10-08 DIAGNOSIS — K5900 Constipation, unspecified: Secondary | ICD-10-CM | POA: Diagnosis not present

## 2014-10-08 DIAGNOSIS — F329 Major depressive disorder, single episode, unspecified: Secondary | ICD-10-CM | POA: Diagnosis not present

## 2014-10-08 DIAGNOSIS — Z7901 Long term (current) use of anticoagulants: Secondary | ICD-10-CM | POA: Diagnosis not present

## 2014-10-26 ENCOUNTER — Telehealth: Payer: Self-pay | Admitting: Family Medicine

## 2014-10-26 NOTE — Telephone Encounter (Signed)
Cindy from [facility] is requesting an PRN order for PT/INR and for urine sample. Please return call [phone]

## 2014-10-26 NOTE — Telephone Encounter (Signed)
Spoke with Jenny Reichmann at [facility] and authorized PT/INR and Urinalysis plus urine culture.

## 2014-10-27 DIAGNOSIS — N390 Urinary tract infection, site not specified: Secondary | ICD-10-CM | POA: Diagnosis not present

## 2014-10-27 DIAGNOSIS — Z87440 Personal history of urinary (tract) infections: Secondary | ICD-10-CM | POA: Diagnosis not present

## 2014-11-05 ENCOUNTER — Telehealth: Payer: Self-pay | Admitting: Family Medicine

## 2014-11-05 NOTE — Telephone Encounter (Signed)
Called Gerald Montgomery at [facility] and instructed her to change the dosage of Coumadin back to 5 mg. Patient is to take 5 mg Coumadin every evening until Friday, June 17 and she is going to repeat a PT/INR on June 17.

## 2014-11-05 NOTE — Telephone Encounter (Signed)
Please advise 

## 2014-11-07 NOTE — Telephone Encounter (Signed)
Errengous °

## 2014-11-13 ENCOUNTER — Telehealth: Payer: Self-pay | Admitting: Family Medicine

## 2014-11-13 NOTE — Telephone Encounter (Signed)
Spoke with Gerald Montgomery from home health. Patient's INR was reviewed. It is 2.5 which is at therapeutic level. Patient is to continue on Coumadin 5 mg daily and repeat INR in one month.

## 2014-12-11 ENCOUNTER — Telehealth: Payer: Self-pay | Admitting: Family Medicine

## 2014-12-11 NOTE — Telephone Encounter (Signed)
Routed to Dr. Manuella Ghazi for advise, asking him to call patient.

## 2014-12-11 NOTE — Telephone Encounter (Signed)
PT PT/INR. PT -32 AND THE INR-2.7 AND IS ON 5MG  COUMADIN A DAY. PLEASE ADVISE

## 2014-12-11 NOTE — Telephone Encounter (Signed)
Spoke with patient's mother and explained that INR of 2.7 is at goal. Continue Coumadin 5 mg daily and recheck in 1 month

## 2014-12-25 ENCOUNTER — Telehealth: Payer: Self-pay | Admitting: Family Medicine

## 2014-12-25 NOTE — Telephone Encounter (Signed)
Spoke with staff from [facility] and patient's INR is 3.2 obtained this morning. Patient is on Coumadin 5 mg daily. I have instructed them to have patient hold off Coumadin for for tonight and tomorrow night (8/2 and 8/3) and recheck INR on Thursday, December 27 2014.

## 2014-12-25 NOTE — Telephone Encounter (Signed)
Gerald Montgomery from [facility] is requesting a verbal order for PT/INR. States family realized that patient has dark circles under his eyes

## 2014-12-28 ENCOUNTER — Telehealth: Payer: Self-pay | Admitting: Family Medicine

## 2014-12-28 NOTE — Telephone Encounter (Signed)
Lacretia from [facility] states she called yesterday to give PT/INR report and no one returned her call. His PT was 19.4 INR 1.6. Annie Sable was informed that Dr Manuella Ghazi is out of the office today, so she is requesting that you seek advise from another provider that will cover Dr Manuella Ghazi because this patient is not able to go the weekend without coumodin. please advise. [phone]

## 2014-12-28 NOTE — Telephone Encounter (Signed)
Spoke with Gerald Montgomery and informed her of the new coumadin regimen based upon Dr. Allie Dimmer message listed below. She repeated it back to me and said thanks.

## 2014-12-28 NOTE — Telephone Encounter (Signed)
PCP Dr. Norton Blizzard is out of the office. I reviewed the most readily available information regarding patient's INR and coumadin therapy which was the most recent telephone encounter note (see below). I recommend the following coumadin regimen with repeat INR testing in 2 weeks:  Coumadin 2.5mg  on Mon/Wed/Friday/Sun Coumadin 5mg  on Tu/Thurs/Sat  I called Lacretia from [facility] at [phone] and there was no pick up so I left a message for her to call us back.  Previous telephone note: Spoke with staff from [facility] and patient's INR is 3.2 obtained this morning. Patient is on Coumadin 5 mg daily. I have instructed them to have patient hold off Coumadin for for tonight and tomorrow night (8/2 and 8/3) and recheck INR on Thursday, December 27 2014.

## 2015-01-10 ENCOUNTER — Telehealth: Payer: Self-pay | Admitting: Family Medicine

## 2015-01-10 NOTE — Telephone Encounter (Signed)
Spoke with Annie Sable from [facility] for verbal order for PT/INR for this patient and she is rechecking and will call Dr. Manuella Ghazi with results, she has already spoken with on call provider for after hours on 01/09/2015

## 2015-01-10 NOTE — Telephone Encounter (Signed)
Routed this note to Dr. Manuella Ghazi to review results of PT/INR

## 2015-01-10 NOTE — Telephone Encounter (Signed)
Lacretia from [facility] calling to give PT/INR report:  PT 17.7 INR 1.5  Coumodin Dose: Alternating 5mg  on Tues, Thurs, and Satur and 2.5mg  the other days

## 2015-01-10 NOTE — Telephone Encounter (Signed)
PERSON CALLING FROM [facility] ABOUT NEEDING TO DO A PT/INR ON TODAY INSTEAD OF Friday. PLEASE GIVE HER A CALL AT [phone]

## 2015-01-11 NOTE — Telephone Encounter (Signed)
Spoke with Annie Sable from [facility] and recommended that patient should increase Coumadin to 5 mg daily, which was his previous usual dosage. Recheck INR on Monday, 01/14/2015

## 2015-01-11 NOTE — Telephone Encounter (Signed)
Dr. Manuella Ghazi has spoken with Gerald Montgomery from [facility] and Coumadin has been increased to 5 mg daily and will recheck PT/INR on Monday 01/14/2015

## 2015-01-14 ENCOUNTER — Telehealth: Payer: Self-pay | Admitting: Family Medicine

## 2015-01-14 NOTE — Telephone Encounter (Signed)
Routed to Dr. Shah °

## 2015-01-14 NOTE — Telephone Encounter (Signed)
Spoke with Gerald Montgomery and asked her to draw another INR on Wednesday, 01/16/2015. Patient is to continue on Coumadin 5 mg daily at bedtime.

## 2015-01-14 NOTE — Telephone Encounter (Signed)
Antonietta Breach from [facility] called to let you know that they did PT/INR on pt and the results are, PT 21.8 INR 1.8 and hes on 5mg  of coumadin per day. Call back # [phone]

## 2015-01-15 NOTE — Telephone Encounter (Signed)
Dr. Manuella Ghazi has spoken with Charlann Boxer from [facility] and asked her to draw another INR on Wednesday 01/16/2015, Patient is to continue on Coumadin 5 mg daily at bedtime.

## 2015-01-16 ENCOUNTER — Telehealth: Payer: Self-pay | Admitting: Family Medicine

## 2015-01-16 NOTE — Telephone Encounter (Signed)
[name] from [facility] calling with PT/INR results. Pt 27.7 INR 2.3.  Mg of coumaden per day.

## 2015-01-18 NOTE — Telephone Encounter (Signed)
Routed results to Dr. Manuella Ghazi

## 2015-01-18 NOTE — Telephone Encounter (Signed)
Spoke with Charlann Boxer from [facility].patient's INR is therapeutic and he is to continue on Coumadin 5 mg daily. Recheck INR in one month.

## 2015-01-21 ENCOUNTER — Telehealth: Payer: Self-pay | Admitting: Family Medicine

## 2015-01-21 NOTE — Telephone Encounter (Signed)
Anderson Malta from [facility] is checking status on paperwork that she had faxed on 8-19, 8-22, 8-24, and 8-26. Please return call [phone] Ext [phone]

## 2015-01-21 NOTE — Telephone Encounter (Signed)
Paperwork has been faxed on 01/21/2015

## 2015-01-25 ENCOUNTER — Telehealth: Payer: Self-pay | Admitting: Family Medicine

## 2015-01-25 NOTE — Telephone Encounter (Signed)
Spoke with Charlann Boxer and patient is having chills, and sweating. No fever. Patient has neurogenic bladder and is prone to UTIs. Approved obtaining a urine specimen for urinalysis and culture.

## 2015-01-25 NOTE — Telephone Encounter (Signed)
Routed to Dr. Shah for approval 

## 2015-01-25 NOTE — Telephone Encounter (Signed)
Gerald Montgomery from [facility] states patient is not running fever, however he does have chills and is sweating. Would like to get urine specimen either today or tomorrow. Please return call [phone]

## 2015-01-26 DIAGNOSIS — N390 Urinary tract infection, site not specified: Secondary | ICD-10-CM | POA: Diagnosis not present

## 2015-02-05 ENCOUNTER — Telehealth: Payer: Self-pay | Admitting: Family Medicine

## 2015-02-05 ENCOUNTER — Other Ambulatory Visit: Payer: Self-pay | Admitting: Family Medicine

## 2015-02-05 NOTE — Telephone Encounter (Signed)
Patient's urinalysis and culture report. Urinalysis shows 3+ WBC esterase and 1+ protein. Urine culture shows moderate bacteria identified as viridans streptococcus. According to the lab,  susceptibility studies are not normally performed on this organism in general. Reviewed with Charlann Boxer from [facility]. We will request the lab to identify the most appropriate antibiotic therapy for this organism.

## 2015-02-14 ENCOUNTER — Telehealth: Payer: Self-pay | Admitting: Family Medicine

## 2015-02-14 NOTE — Telephone Encounter (Signed)
Call report PT: 47 INR: 3.8 (a little over) Currently taking 5mg  coumadin daily

## 2015-02-14 NOTE — Telephone Encounter (Signed)
Routed to report results to Dr. Manuella Ghazi

## 2015-02-17 NOTE — Telephone Encounter (Signed)
Spoke with Charlann Boxer from [facility] and asked her to recheck INR in a.m. on 02/18/2015. Patient is to hold Coumadin until results from INR check are reviewed.

## 2015-02-18 ENCOUNTER — Telehealth: Payer: Self-pay | Admitting: Family Medicine

## 2015-02-18 ENCOUNTER — Encounter: Payer: Self-pay | Admitting: Family Medicine

## 2015-02-18 NOTE — Telephone Encounter (Signed)
Gerald Montgomery from [facility] giving call report and requesting a return call, she is only working a half day today [phone] PT: 54.4 INR: 4.5 Coumadin 5 mg per day

## 2015-02-20 NOTE — Telephone Encounter (Signed)
Dr. Manuella Ghazi has spoken with Gerald Montgomery from [facility] concerning these results

## 2015-02-20 NOTE — Telephone Encounter (Signed)
Dr. Manuella Ghazi has spoken with Antonietta Breach from [facility] and have given her instructions for this patient

## 2015-02-28 ENCOUNTER — Telehealth: Payer: Self-pay | Admitting: Family Medicine

## 2015-02-28 NOTE — Telephone Encounter (Signed)
Routed this report of INR Results to Dr. Manuella Ghazi for medication adjustment

## 2015-03-03 NOTE — Telephone Encounter (Signed)
Called and left a voice message for [facility] [employer]

## 2015-03-04 NOTE — Telephone Encounter (Signed)
Dr. Manuella Ghazi has called and left a voice message for [facility]

## 2015-03-05 ENCOUNTER — Telehealth: Payer: Self-pay | Admitting: Family Medicine

## 2015-03-05 NOTE — Telephone Encounter (Signed)
Routed to Dr. Manuella Ghazi for results

## 2015-03-06 NOTE — Telephone Encounter (Signed)
Spoke with Charlann Boxer from [employer]. Patient's INR is 1.1 and he has been restarted on Coumadin 5 mg daily since Tuesday, 03/05/2015. Recommended that he continue with daily Coumadin and recheck INR on Sunday, 03/10/2015.

## 2015-03-06 NOTE — Telephone Encounter (Signed)
Mom called this morning and states that patient seems to be having a hard time with the warfin prescription. She is very concerned and is requesting a return call

## 2015-03-11 ENCOUNTER — Telehealth: Payer: Self-pay | Admitting: Family Medicine

## 2015-03-11 NOTE — Telephone Encounter (Signed)
Routed results to Dr. Shah °

## 2015-03-11 NOTE — Telephone Encounter (Signed)
Returned call and left a voice message. INR is therapeutic at 2.1. Patient is to continue 5 mg Coumadin daily and recheck INR in one month.

## 2015-03-12 NOTE — Telephone Encounter (Signed)
Dr Manuella Ghazi returned call and left a voice message. INR is therapeutic at 2.1. Patient is to continue 5 mg Coumadin daily and recheck INR in one month. Gerald Montgomery returned Dr. Trena Platt call this morning 03/12/2015 and results were reported

## 2015-03-14 ENCOUNTER — Telehealth: Payer: Self-pay

## 2015-03-14 NOTE — Telephone Encounter (Signed)
This patient's social worker (Auden Linens) called wanting to know if there is anything that he could assist with in partnering in the care of this patient since he was making a home visit today. He did state that  His mother had mentioned about getting him started with physical therapy. He went on to say he feels this patient could do more but do to his current fear of falling he limits himself.   Wai, informed me that this patient currently has 26 in home aid hours per week through their CAP budget and that he will verify with his family that they are coming out and doing as they are supposed to based upon the CAP agreement. He asked for [place] to give him a call if there was anything that Dr. Manuella Ghazi wanted him to look into and I asked him to give [place] a call if he sees anything or if he has any concerns after his home visit today. I thanked him for helping [place] and he did the same.

## 2015-03-14 NOTE — Telephone Encounter (Signed)
Agreed with plan. Please have Mr. Topher contact our office if he needs assistance in any manner concerning the care of our mutual patient

## 2015-03-19 ENCOUNTER — Other Ambulatory Visit: Payer: Self-pay | Admitting: Family Medicine

## 2015-03-19 DIAGNOSIS — N319 Neuromuscular dysfunction of bladder, unspecified: Secondary | ICD-10-CM

## 2015-03-19 MED ORDER — BETHANECHOL CHLORIDE 50 MG PO TABS: 50.0000 mg | ORAL_TABLET | Freq: Three times a day (TID) | ORAL | Status: DC

## 2015-03-19 NOTE — Telephone Encounter (Signed)
Would like a refill and has a follow up 04/04/15. Could you please refill and send to [facility] in [place].

## 2015-03-19 NOTE — Telephone Encounter (Signed)
Patient is completely out of [name] 500 mg take one tablet by mouth 3 times daily. Please send to [facility], patient is completely out.

## 2015-03-21 DIAGNOSIS — Z23 Encounter for immunization: Secondary | ICD-10-CM | POA: Diagnosis not present

## 2015-04-10 ENCOUNTER — Telehealth: Payer: Self-pay | Admitting: Family Medicine

## 2015-04-10 NOTE — Telephone Encounter (Signed)
Spoke with Gerald Montgomery from home health. Patient's NR is 4.0. He is on Coumadin 5 mg daily. We will hold Coumadin from 11/16 through 11/18 and recheck on 11/19. Verbalized understanding.

## 2015-04-11 ENCOUNTER — Encounter: Payer: Self-pay | Admitting: Family Medicine

## 2015-04-11 ENCOUNTER — Ambulatory Visit (INDEPENDENT_AMBULATORY_CARE_PROVIDER_SITE_OTHER): Payer: Medicare Other | Admitting: Family Medicine

## 2015-04-11 VITALS — BP 130/82 | HR 76 | Temp 98.6°F | Resp 17 | Ht 65.0 in

## 2015-04-11 DIAGNOSIS — N319 Neuromuscular dysfunction of bladder, unspecified: Secondary | ICD-10-CM | POA: Diagnosis not present

## 2015-04-11 DIAGNOSIS — Z7901 Long term (current) use of anticoagulants: Secondary | ICD-10-CM

## 2015-04-11 DIAGNOSIS — F39 Unspecified mood [affective] disorder: Secondary | ICD-10-CM | POA: Insufficient documentation

## 2015-04-11 DIAGNOSIS — Z5181 Encounter for therapeutic drug level monitoring: Secondary | ICD-10-CM | POA: Diagnosis not present

## 2015-04-11 DIAGNOSIS — Z86718 Personal history of other venous thrombosis and embolism: Secondary | ICD-10-CM | POA: Diagnosis not present

## 2015-04-11 NOTE — Progress Notes (Signed)
Name: DELVONTA ROOTES   MRN: [id number]    DOB: [birth year]   Date:04/11/2015       Progress Note  Subjective  Chief Complaint  Chief Complaint  Patient presents with  . Follow-up    6 mo  . Depression    Depression        This is a chronic problem.  The onset quality is gradual. The problem is unchanged.  Associated symptoms include no fatigue, no hopelessness, does not have insomnia and not sad.  Past treatments include other medications (Abilify 15mg  once daily).  Compliance with treatment is good.  Previous treatment provided significant relief.  Past medical history includes chronic illness.    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 Coumadin has been temporarily discontinued until Friday, November 18 th. No episodes of bleeding.  Neurogenic Bladder Pt. Has history of urinary incontinence, started many years ago. Had work up for the same and was told that he had a lot of 'debris' in the bladder. It was believed that the incontinence is mainly from deconditioning and lack of activity. He is on [name] 50 mg daily which helps relieve his symptoms. No side effects reported.   Past Medical History  Diagnosis Date  . History of DVT of lower extremity   . Urinary incontinence   . Mood disorder ([place])   . Neurogenic bladder     Past Surgical History  Procedure Laterality Date  . Hip fracture surgery      Family History  Problem Relation Age of Onset  . Heart disease Mother   . Atrial fibrillation Mother   . Diabetes Mother     Borderline  . Heart disease Father     Passed away of heart attack    Social History   Social History  . Marital Status: Single    Spouse Name: N/A  . Number of Children: N/A  . Years of Education: N/A   Occupational History  . Not on file.   Social History Main Topics  . Smoking status: Never Smoker   .  Smokeless tobacco: Never Used  . Alcohol Use: No  . Drug Use: No  . Sexual Activity: No   Other Topics Concern  . Not on file   Social History Narrative  . No narrative on file     Current outpatient prescriptions:  .  ARIPiprazole (ABILIFY) 15 MG tablet, , Disp: , Rfl:  .  bethanechol (URECHOLINE) 50 MG tablet, Take 1 tablet (50 mg total) by mouth 3 (three) times daily., Disp: 90 tablet, Rfl: 0 .  warfarin (COUMADIN) 5 MG tablet, , Disp: , Rfl:   No Known Allergies   Review of Systems  Constitutional: Negative for fever, chills, weight loss and fatigue.  Respiratory: Negative for hemoptysis and shortness of breath.   Cardiovascular: Negative for chest pain and leg swelling.  Gastrointestinal: Negative for blood in stool.  Genitourinary: Negative for dysuria, frequency and hematuria.  Psychiatric/Behavioral: Positive for depression. The patient does not have insomnia.   All other systems reviewed and are negative.   Objective  Filed Vitals:   04/11/15 1017  BP: 130/82  Pulse: 76  Temp: 98.6 F (37 C)  TempSrc: Oral  Resp: 17  Height: 5\' 5"  (1.651 m)  SpO2: 99%    Physical Exam  Constitutional: He is well-developed, well-nourished, and in no distress.  Vital signs are normal. He appears not dehydrated. He does not have a sickly appearance. No distress.  Sitting in wheelchair  HENT:  Head: Normocephalic and atraumatic.  Mouth/Throat: No posterior oropharyngeal erythema.  Cardiovascular: Normal rate and regular rhythm.   Pulmonary/Chest: Breath sounds normal. He has no wheezes.  Abdominal: Soft. Normal appearance. There is no tenderness.  Musculoskeletal:  No swelling in Lower extremitites but has tightness/contractures.   Neurological: He is alert.  Psychiatric: Mood and affect normal.  Nursing note and vitals reviewed.    Assessment & Plan  1. History of DVT of lower extremity Coumadin on hold until Saturday, November 19 when his next INR check is due.  Last INR of 4.0, which was supratherapeutic.  2. Mood disorder (HCC) Symptoms stable on Abilify 15 mg daily  3. Neurogenic bladder Unchanged on [name]. We will obtain kidney function today. - Comprehensive Metabolic Panel (CMET)     Jejuan Scala [facility] 04/11/2015 11:33 AM

## 2015-04-12 LAB — COMPREHENSIVE METABOLIC PANEL
ALK PHOS: 93 IU/L (ref 39–117)
ALT: 22 IU/L (ref 0–44)
AST: 17 IU/L (ref 0–40)
Albumin/Globulin Ratio: 1.7 (ref 1.1–2.5)
Albumin: 4.3 g/dL (ref 3.5–5.5)
BILIRUBIN TOTAL: 0.5 mg/dL (ref 0.0–1.2)
BUN/Creatinine Ratio: 34 — ABNORMAL HIGH (ref 9–20)
BUN: 19 mg/dL (ref 6–24)
CHLORIDE: 103 mmol/L (ref 97–106)
CO2: 26 mmol/L (ref 18–29)
Calcium: 9.1 mg/dL (ref 8.7–10.2)
Creatinine, Ser: 0.56 mg/dL — ABNORMAL LOW (ref 0.76–1.27)
GFR calc Af Amer: 132 mL/min/{1.73_m2} (ref >59)
GFR calc non Af Amer: 114 mL/min/{1.73_m2} (ref >59)
GLUCOSE: 87 mg/dL (ref 65–99)
Globulin, Total: 2.6 g/dL (ref 1.5–4.5)
Potassium: 4.2 mmol/L (ref 3.5–5.2)
Sodium: 144 mmol/L (ref 136–144)
Total Protein: 6.9 g/dL (ref 6.0–8.5)

## 2015-04-22 ENCOUNTER — Telehealth: Payer: Self-pay | Admitting: Family Medicine

## 2015-04-22 NOTE — Telephone Encounter (Signed)
Routed PT/INR results from Nurse Antonietta Breach to Dr. Manuella Ghazi

## 2015-04-22 NOTE — Telephone Encounter (Signed)
INR of 2.0, patient to continue on present and usual dosage of Coumadin 5 mg daily. Recheck INR in one month. Plan discussed with Charlann Boxer and verbalized agreement.

## 2015-04-22 NOTE — Telephone Encounter (Signed)
Gerald Montgomery from [place] calling to give PT/INR report PT: 23.8 INR: 2.0 Patient is on 5 mg coumadin daily

## 2015-05-08 ENCOUNTER — Telehealth: Payer: Self-pay | Admitting: Family Medicine

## 2015-05-08 NOTE — Telephone Encounter (Signed)
[name] from [place] needs a call back on behalf of the patient.

## 2015-05-10 ENCOUNTER — Other Ambulatory Visit: Payer: Self-pay | Admitting: Family Medicine

## 2015-05-10 NOTE — Telephone Encounter (Signed)
Routed to Dr. Manuella Ghazi for advice and return call

## 2015-05-11 NOTE — Telephone Encounter (Signed)
Return call and recommend checking INR in one week. Last INR therapeutic and patient is on usual dosage of Coumadin

## 2015-05-14 ENCOUNTER — Telehealth: Payer: Self-pay | Admitting: Family Medicine

## 2015-05-14 DIAGNOSIS — N319 Neuromuscular dysfunction of bladder, unspecified: Secondary | ICD-10-CM

## 2015-05-14 MED ORDER — BETHANECHOL CHLORIDE 50 MG PO TABS: 50.0000 mg | ORAL_TABLET | Freq: Three times a day (TID) | ORAL | Status: DC

## 2015-05-14 NOTE — Telephone Encounter (Signed)
Medication has been refilled and sent to [facility] 

## 2015-05-14 NOTE — Telephone Encounter (Signed)
Pt needs refill on [name] to be called into [facility] in [place].

## 2015-05-15 ENCOUNTER — Telehealth: Payer: Self-pay

## 2015-05-15 NOTE — Telephone Encounter (Signed)
Got a call from Gerald Boxer, Gerald Montgomery, to inform us of his results.  INR: 3.3 PT: 39.6  This patient has 5mg  tablets. He takes one everyday since he has a history of DVT. His goal is 2-3 and he has not been seen in office since 12/2013.

## 2015-05-15 NOTE — Telephone Encounter (Signed)
Please instruct patient to change coumadin regimen to:  Coumadin 5 mg one by mouth every day EXCEPT for Wednesdays take 2.5mg .

## 2015-05-15 NOTE — Telephone Encounter (Signed)
Informed Charlann Boxer, RN of Dr. Allie Dimmer message below. She repeated the instructions back to me and said thanks.

## 2015-05-22 ENCOUNTER — Telehealth: Payer: Self-pay | Admitting: Family Medicine

## 2015-05-22 NOTE — Telephone Encounter (Signed)
[name] from [place]: did PT/INR this morning: PT 44.5 INR  3.7 (high) Right now as of 05/15/2015 patient took 2.5 mg and rest of days he took 5 mg

## 2015-05-22 NOTE — Telephone Encounter (Signed)
Routed to Dr. Shah for review  

## 2015-05-22 NOTE — Telephone Encounter (Signed)
Spoke with Charlann Boxer. Patient to DC Coumadin for 3 days 12/28, 12/29, and 12/30 and repeat INR on 12/31 in a.m.

## 2015-05-28 ENCOUNTER — Telehealth: Payer: Self-pay | Admitting: Family Medicine

## 2015-05-28 NOTE — Telephone Encounter (Signed)
FYIAntonietta Montgomery from [place] called with the PT/INR reading on pt, 14.6 PT, INR 1.2.Pt has been on 2.5 mg of coumadin since Saturday 05/25/15. [name]'s # is [phone]

## 2015-05-30 NOTE — Telephone Encounter (Signed)
Routed results to Dr. Shah °

## 2015-05-31 NOTE — Telephone Encounter (Signed)
Spoke with Charlann Boxer and iIncreased Coumadin to 5mg  at bedtime. Repeat INR on Tuesday 06/04/15.

## 2015-06-04 ENCOUNTER — Telehealth: Payer: Self-pay

## 2015-06-04 NOTE — Telephone Encounter (Signed)
Charlann Boxer called from home health.  Patients PT was 20.1 and INR was 1.7, he is currently on 5 mg per day

## 2015-06-04 NOTE — Telephone Encounter (Signed)
INR is subtherapeutic. Advised to increase Coumadin to 7.5 mg on Wednesday 1/11 and Thursday 1/12 and repeat INR on Friday 1/13. Verbalized understanding

## 2015-06-04 NOTE — Telephone Encounter (Signed)
Charlann Boxer called from home health patients PT :20.1 and INR:1.7 currently on 5mg  of coumadin per day  [facility] # [phone]

## 2015-06-07 ENCOUNTER — Telehealth: Payer: Self-pay | Admitting: Family Medicine

## 2015-06-07 NOTE — Telephone Encounter (Signed)
INR Therapeutic. Decrease Coumadin to 5mg  every night and recheck in 1 month.

## 2015-06-07 NOTE — Telephone Encounter (Signed)
Gerald Montgomery from [place] PT: 25.7 INR: 2.1 Taking 7.5 mg coumodin per day

## 2015-06-07 NOTE — Telephone Encounter (Signed)
Routed Results to Dr. Manuella Ghazi

## 2015-07-04 ENCOUNTER — Telehealth: Payer: Self-pay

## 2015-07-04 DIAGNOSIS — Z7901 Long term (current) use of anticoagulants: Secondary | ICD-10-CM | POA: Diagnosis not present

## 2015-07-04 DIAGNOSIS — Z5181 Encounter for therapeutic drug level monitoring: Secondary | ICD-10-CM | POA: Diagnosis not present

## 2015-07-04 LAB — PROTIME-INR: INR: 4.2 — AB (ref 0.9–1.1)

## 2015-07-04 NOTE — Telephone Encounter (Signed)
PT/INR results reporting from [facility]

## 2015-07-05 NOTE — Telephone Encounter (Signed)
Spoke with Gerald Montgomery, patient's INR was 4.6 on Thursday 07-04-15. Advised to hold Coumadin Thursday, Friday, and Saturday (2/9, 2/10, and 211) and recheck on Sunday 07/07/15.

## 2015-07-10 ENCOUNTER — Other Ambulatory Visit: Payer: Self-pay | Admitting: Family Medicine

## 2015-07-12 ENCOUNTER — Telehealth: Payer: Self-pay

## 2015-07-12 NOTE — Telephone Encounter (Signed)
INR is at goal at 2.2

## 2015-07-12 NOTE — Telephone Encounter (Signed)
[name] from [facility]  called# [phone].  Patients INR was 2.2 and PT 26.  Currently on 5mg  daily

## 2015-07-12 NOTE — Telephone Encounter (Signed)
Tried to call Francia Greaves no response from the other end. Patient may continue on current dose of Coumadin for now and recheck in one month.

## 2015-07-19 ENCOUNTER — Telehealth: Payer: Self-pay | Admitting: Family Medicine

## 2015-07-19 NOTE — Telephone Encounter (Signed)
Gerald Montgomery states patient has bladder infection: burn when urinating along with back pain that began yesterday. Patient has history of bladder infection. Requesting a prescription to be sent to [facility]

## 2015-07-23 NOTE — Telephone Encounter (Signed)
Routed to Dr. Shah for new prescription 

## 2015-07-23 NOTE — Telephone Encounter (Signed)
Please contact patient's nurse, advised her to obtain a urinalysis and contact us back with the results. Otherwise please schedule patient for an appointment to be seen

## 2015-07-30 ENCOUNTER — Other Ambulatory Visit: Payer: Self-pay | Admitting: Family Medicine

## 2015-07-30 ENCOUNTER — Telehealth: Payer: Self-pay

## 2015-07-30 MED ORDER — WARFARIN SODIUM 5 MG PO TABS: 5.0000 mg | ORAL_TABLET | Freq: Every day | ORAL | Status: DC

## 2015-07-30 NOTE — Telephone Encounter (Signed)
Medication has been refilled and sent to [facility] °

## 2015-07-30 NOTE — Telephone Encounter (Signed)
Medication has been refilled and sent to [facility] 

## 2015-08-09 ENCOUNTER — Telehealth: Payer: Self-pay | Admitting: Family Medicine

## 2015-08-09 NOTE — Telephone Encounter (Signed)
Routed results to Dr. Shah °

## 2015-08-09 NOTE — Telephone Encounter (Signed)
Charlann Boxer from [place] calling to give results: PT: 42.1 INR: 3.5 On 5mg  coumdin per day Patient is having a lot of odor in urine and would like to know if they can do an UA and if not able to void could they do a catherization? [phone]

## 2015-08-09 NOTE — Telephone Encounter (Signed)
Spoke with Charlann Boxer from [facility] INR is supratherapeutic, hold Coumadin for 3 days Friday, Saturday, Sunday and repeat on Monday, 08/12/2015. Okay to check urinalysis.

## 2015-08-12 DIAGNOSIS — N390 Urinary tract infection, site not specified: Secondary | ICD-10-CM | POA: Diagnosis not present

## 2015-08-15 ENCOUNTER — Telehealth: Payer: Self-pay | Admitting: Family Medicine

## 2015-08-15 NOTE — Telephone Encounter (Signed)
Gerald Montgomery from [place] giving report: PT: 24.7 INR: 2.1 Doing 5 MG of Coumodin

## 2015-08-15 NOTE — Telephone Encounter (Signed)
INR is now therapeutic, continue on present dose of Coumadin and recheck in 4 weeks

## 2015-08-16 ENCOUNTER — Telehealth: Payer: Self-pay | Admitting: Family Medicine

## 2015-08-16 DIAGNOSIS — N3000 Acute cystitis without hematuria: Secondary | ICD-10-CM

## 2015-08-16 DIAGNOSIS — N390 Urinary tract infection, site not specified: Secondary | ICD-10-CM | POA: Insufficient documentation

## 2015-08-16 MED ORDER — NITROFURANTOIN MONOHYD MACRO 100 MG PO CAPS: 100.0000 mg | ORAL_CAPSULE | Freq: Two times a day (BID) | ORAL | Status: DC

## 2015-08-16 NOTE — Telephone Encounter (Signed)
Urinalysis shows positive WBC esterase, 2+ protein, greater than 30 WBCs. Culture shows mixed urogenital flora greater than 100,000 CFU's per mL. This likely indicates acute cystitis. We'll start on Macrobid 100 mg twice daily 10 days. Prescription sent to pharmacy. Patient's mother verbalized understanding of instructions.

## 2015-08-27 ENCOUNTER — Other Ambulatory Visit: Payer: Self-pay | Admitting: Family Medicine

## 2015-08-27 ENCOUNTER — Telehealth: Payer: Self-pay | Admitting: Family Medicine

## 2015-08-27 MED ORDER — BETHANECHOL CHLORIDE 50 MG PO TABS: ORAL_TABLET | ORAL | Status: DC

## 2015-08-27 NOTE — Telephone Encounter (Signed)
Medication has been refilled and sent to [facility] 

## 2015-08-27 NOTE — Telephone Encounter (Signed)
Requesting refill on bethanechol. Only have one pill left. Please send to [facility]

## 2015-08-28 ENCOUNTER — Telehealth: Payer: Self-pay | Admitting: Family Medicine

## 2015-08-28 NOTE — Telephone Encounter (Signed)
Routed to Dr. Manuella Ghazi for new prescription approval

## 2015-08-28 NOTE — Telephone Encounter (Signed)
Called [name] and left a voice message

## 2015-08-28 NOTE — Telephone Encounter (Signed)
Pt having a lot of moisture in the groin area and wants to know if they can supply him with a cream that would be a help with the moisture. Please call sherry at [phone]

## 2015-08-29 ENCOUNTER — Encounter: Payer: Self-pay | Admitting: Family Medicine

## 2015-09-02 ENCOUNTER — Other Ambulatory Visit: Payer: Self-pay | Admitting: Family Medicine

## 2015-09-12 ENCOUNTER — Telehealth: Payer: Self-pay

## 2015-09-12 DIAGNOSIS — Z7901 Long term (current) use of anticoagulants: Secondary | ICD-10-CM | POA: Diagnosis not present

## 2015-09-12 DIAGNOSIS — Z5181 Encounter for therapeutic drug level monitoring: Secondary | ICD-10-CM | POA: Diagnosis not present

## 2015-09-12 NOTE — Telephone Encounter (Signed)
Home care called and stated that pt PT was 5.1. Manuella Ghazi instructed her to hold coumadin until Monday and then recheck on Monday

## 2015-09-16 ENCOUNTER — Telehealth: Payer: Self-pay | Admitting: Family Medicine

## 2015-09-16 NOTE — Telephone Encounter (Signed)
[name] OF [facility] SAYS THAT PT PT/INR IS  PT 25.2 AND INR 2.1. PT HAS NOT HAD HIS MEDS ON 09-12-15 FOR IT WAS TO HIGH. PLEASE ADVISE

## 2015-09-17 NOTE — Telephone Encounter (Signed)
INR is now therapeutic, patient may continue on his usual dose of Coumadin and recheck in one month.

## 2015-09-17 NOTE — Telephone Encounter (Signed)
Routed to Dr. Manuella Ghazi for advice and call back

## 2015-10-02 ENCOUNTER — Telehealth: Payer: Self-pay | Admitting: Family Medicine

## 2015-10-02 NOTE — Telephone Encounter (Signed)
Patient needs to be seen and evaluated for symptoms in office. Please schedule an appointment.

## 2015-10-02 NOTE — Telephone Encounter (Signed)
Tiffany (caregiver) requesting order because patient has a lot of blood in his urine. Please send order to [facility].

## 2015-10-02 NOTE — Telephone Encounter (Signed)
appt made for 10-07-15. Was offered something sooner but mom had to arrange transportation. Also would like to know is it okay for him to drink cranberry juice while taking warfin?

## 2015-10-03 NOTE — Telephone Encounter (Signed)
[facility] can collect urine and run urinalysis and order a urine culture, also order a CBC with differential and a Comprehensive metabolic panel, call the results to our office as soon as available.

## 2015-10-03 NOTE — Telephone Encounter (Signed)
Please review message

## 2015-10-03 NOTE — Telephone Encounter (Signed)
Tiffany Caregiver to pt called and states that patients urine is very cloudy and pt seems to be running a fever. Caregiver understands he has an appt and due to transportation they can not get him here before 10/07/2015. Pt needs for [facility] to come and collect his urine but they need order to do so. A order has to be called in before they can come. Please advise.

## 2015-10-07 ENCOUNTER — Encounter: Payer: Self-pay | Admitting: Family Medicine

## 2015-10-07 ENCOUNTER — Ambulatory Visit (INDEPENDENT_AMBULATORY_CARE_PROVIDER_SITE_OTHER): Payer: Medicare Other | Admitting: Family Medicine

## 2015-10-07 ENCOUNTER — Telehealth: Payer: Self-pay | Admitting: Family Medicine

## 2015-10-07 VITALS — BP 130/70 | HR 120 | Temp 98.7°F | Resp 16

## 2015-10-07 DIAGNOSIS — R310 Gross hematuria: Secondary | ICD-10-CM

## 2015-10-07 DIAGNOSIS — Z5181 Encounter for therapeutic drug level monitoring: Secondary | ICD-10-CM

## 2015-10-07 DIAGNOSIS — Z7901 Long term (current) use of anticoagulants: Secondary | ICD-10-CM

## 2015-10-07 MED ORDER — NITROFURANTOIN MONOHYD MACRO 100 MG PO CAPS: 100.0000 mg | ORAL_CAPSULE | Freq: Two times a day (BID) | ORAL | Status: DC

## 2015-10-07 NOTE — Progress Notes (Signed)
Name: Gerald Montgomery   MRN: [id number]    DOB: [birth year]   Date:10/07/2015       Progress Note  Subjective  Chief Complaint  Chief Complaint  Patient presents with  . Hematuria    Hematuria This is a new problem. The current episode started in the past 7 days. The problem has been gradually improving since onset. He describes the hematuria as gross hematuria. He is experiencing no pain. He describes his urine color as dark red. Irritative symptoms do not include frequency. Obstructive symptoms do not include dribbling or a weak stream. Associated symptoms include chills. Pertinent negatives include no abdominal pain, dysuria or flank pain.    Past Medical History  Diagnosis Date  . History of DVT of lower extremity   . Urinary incontinence   . Mood disorder ([place])   . Neurogenic bladder     Past Surgical History  Procedure Laterality Date  . Hip fracture surgery      Family History  Problem Relation Age of Onset  . Heart disease Mother   . Atrial fibrillation Mother   . Diabetes Mother     Borderline  . Heart disease Father     Passed away of heart attack    Social History   Social History  . Marital Status: Single    Spouse Name: N/A  . Number of Children: N/A  . Years of Education: N/A   Occupational History  . Not on file.   Social History Main Topics  . Smoking status: Never Smoker   . Smokeless tobacco: Never Used  . Alcohol Use: No  . Drug Use: No  . Sexual Activity: No   Other Topics Concern  . Not on file   Social History Narrative     Current outpatient prescriptions:  .  ARIPiprazole (ABILIFY) 15 MG tablet, Take 1 tablet (15 mg total) by mouth daily., Disp: 90 tablet, Rfl: 0 .  bethanechol (URECHOLINE) 50 MG tablet, TAKE 1 TABLET(50 MG) BY MOUTH THREE TIMES DAILY, Disp: 90 tablet, Rfl: 0 .  nitrofurantoin, macrocrystal-monohydrate, (MACROBID) 100 MG capsule, Take 1 capsule (100 mg total) by mouth 2 (two) times daily., Disp: 20 capsule, Rfl:  0 .  warfarin (COUMADIN) 5 MG tablet, TAKE 1 TABLET(5 MG) BY MOUTH DAILY, Disp: 30 tablet, Rfl: 11  No Known Allergies   Review of Systems  Constitutional: Positive for chills.  Gastrointestinal: Negative for abdominal pain.  Genitourinary: Positive for hematuria. Negative for dysuria, frequency and flank pain.    Objective  Filed Vitals:   10/07/15 1542  BP: 130/70  Pulse: 120  Temp: 98.7 F (37.1 C)  TempSrc: Oral  Resp: 16  SpO2: 94%    Physical Exam  Constitutional: He is well-developed, well-nourished, and in no distress.  Cardiovascular: Normal heart sounds.  Tachycardia present.   Pulmonary/Chest: Breath sounds normal.  Abdominal: Soft. There is no tenderness.  Nursing note and vitals reviewed.     Assessment & Plan  1. Anticoagulation goal of INR 2 to 3 Plan therapeutic INR, advised to hold Coumadin for 4 days (Monday through Thursday) and recheck on Friday. No active bleeding episodes, hematuria has improved.  2. Hematuria, gross Likely UTI given similar history in the past, however would like to rule out other possible etiologies, especially in the setting of supratherapeutic INR. Obtain urinalysis and culture and then start on antibiotic therapy.  - Urinalysis, Routine w reflex microscopic - Urine Culture - CBC with Differential - Comprehensive Metabolic Panel (CMET) -  nitrofurantoin, macrocrystal-monohydrate, (MACROBID) 100 MG capsule; Take 1 capsule (100 mg total) by mouth 2 (two) times daily.  Dispense: 14 capsule; Refill: 0   [facility] 10/07/2015 4:01 PM

## 2015-10-07 NOTE — Telephone Encounter (Signed)
Also nurse wanted to know if he could switch to something other than coumadin if possible

## 2015-10-07 NOTE — Telephone Encounter (Signed)
Gerald Montgomery from [place] calling to give results. Patient does have appointment this afternoon patient PT is 58  INR 4.8 over On 5mg  Coumodin daily

## 2015-10-07 NOTE — Telephone Encounter (Signed)
INR is supratherapeutic at 4.8, patient advised to temporarily discontinue Coumadin for 4 days (Monday, Tuesday, Wednesday, Thursday, 5/15-5/18) and will recheck INR on Friday 5/19. Informed his [facility] nurse Charlann Boxer.

## 2015-10-08 ENCOUNTER — Other Ambulatory Visit (INDEPENDENT_AMBULATORY_CARE_PROVIDER_SITE_OTHER): Payer: Medicare Other | Admitting: Family Medicine

## 2015-10-08 DIAGNOSIS — R310 Gross hematuria: Secondary | ICD-10-CM

## 2015-10-08 LAB — COMPREHENSIVE METABOLIC PANEL
A/G RATIO: 1.6 (ref 1.2–2.2)
ALT: 95 IU/L — ABNORMAL HIGH (ref 0–44)
AST: 72 IU/L — AB (ref 0–40)
Albumin: 4.2 g/dL (ref 3.5–5.5)
Alkaline Phosphatase: 121 IU/L — ABNORMAL HIGH (ref 39–117)
BILIRUBIN TOTAL: 0.2 mg/dL (ref 0.0–1.2)
BUN/Creatinine Ratio: 47 — ABNORMAL HIGH (ref 9–20)
BUN: 22 mg/dL (ref 6–24)
CALCIUM: 8.7 mg/dL (ref 8.7–10.2)
CO2: 21 mmol/L (ref 18–29)
Chloride: 107 mmol/L — ABNORMAL HIGH (ref 96–106)
Creatinine, Ser: 0.47 mg/dL — ABNORMAL LOW (ref 0.76–1.27)
GFR, EST AFRICAN AMERICAN: 142 mL/min/{1.73_m2} (ref >59)
GFR, EST NON AFRICAN AMERICAN: 123 mL/min/{1.73_m2} (ref >59)
GLOBULIN, TOTAL: 2.6 g/dL (ref 1.5–4.5)
Glucose: 91 mg/dL (ref 65–99)
Potassium: 4.1 mmol/L (ref 3.5–5.2)
SODIUM: 145 mmol/L — AB (ref 134–144)
TOTAL PROTEIN: 6.8 g/dL (ref 6.0–8.5)

## 2015-10-08 LAB — CBC WITH DIFFERENTIAL/PLATELET
BASOS: 0 %
Basophils Absolute: 0 10*3/uL (ref 0.0–0.2)
EOS (ABSOLUTE): 0.1 10*3/uL (ref 0.0–0.4)
EOS: 1 %
HEMATOCRIT: 40.9 % (ref 37.5–51.0)
Hemoglobin: 13.9 g/dL (ref 12.6–17.7)
IMMATURE GRANULOCYTES: 0 %
Immature Grans (Abs): 0 10*3/uL (ref 0.0–0.1)
LYMPHS ABS: 1.4 10*3/uL (ref 0.7–3.1)
Lymphs: 18 %
MCH: 29.4 pg (ref 26.6–33.0)
MCHC: 34 g/dL (ref 31.5–35.7)
MCV: 87 fL (ref 79–97)
MONOS ABS: 0.5 10*3/uL (ref 0.1–0.9)
Monocytes: 7 %
NEUTROS ABS: 5.6 10*3/uL (ref 1.4–7.0)
NEUTROS PCT: 74 %
Platelets: 180 10*3/uL (ref 150–379)
RBC: 4.72 x10E6/uL (ref 4.14–5.80)
RDW: 14.5 % (ref 12.3–15.4)
WBC: 7.6 10*3/uL (ref 3.4–10.8)

## 2015-10-08 LAB — POCT URINALYSIS DIPSTICK
BILIRUBIN UA: NEGATIVE
Glucose, UA: NEGATIVE
KETONES UA: POSITIVE
Nitrite, UA: NEGATIVE
PROTEIN UA: POSITIVE
Spec Grav, UA: 1.025
Urobilinogen, UA: NEGATIVE
pH, UA: 7.5

## 2015-10-10 LAB — URINE CULTURE

## 2015-10-11 ENCOUNTER — Telehealth: Payer: Self-pay | Admitting: Family Medicine

## 2015-10-11 DIAGNOSIS — R310 Gross hematuria: Secondary | ICD-10-CM

## 2015-10-11 NOTE — Telephone Encounter (Signed)
INR is subtherapeutic, we will increase Coumadin to 7.5 mg on Friday 5/19, and Saturday 5/20, resume usual dose of Coumadin on Sunday 5/21 and recheck on Monday 5/22. Patient also has persistent hematuria, Urinalysis shows large blood but no evidence of infection. He will be referred to urology for consideration of cystoscopy.

## 2015-10-11 NOTE — Telephone Encounter (Signed)
[name] from [place] called to give PT INR results for pt; PT 20.6 and INR 1.7. Please advise [name] about Warfarin at [phone].

## 2015-10-14 ENCOUNTER — Telehealth: Payer: Self-pay | Admitting: Family Medicine

## 2015-10-14 ENCOUNTER — Other Ambulatory Visit: Payer: Self-pay | Admitting: Family Medicine

## 2015-10-14 NOTE — Telephone Encounter (Signed)
[name] from [place] called with PT INR results.PT 29.8 INR 2.5. Yesterday he took 5mg  of coumadin.[phone]

## 2015-10-14 NOTE — Telephone Encounter (Signed)
Routed results to Dr. Shah °

## 2015-10-15 NOTE — Telephone Encounter (Signed)
INR is therapeutic at 2.5, patient to continue on Coumadin 5 mg daily and recheck INR in one month.

## 2015-10-24 ENCOUNTER — Ambulatory Visit: Payer: Self-pay | Admitting: Urology

## 2015-10-25 ENCOUNTER — Ambulatory Visit (INDEPENDENT_AMBULATORY_CARE_PROVIDER_SITE_OTHER): Payer: Medicare Other | Admitting: Urology

## 2015-10-25 ENCOUNTER — Encounter: Payer: Self-pay | Admitting: Urology

## 2015-10-25 ENCOUNTER — Other Ambulatory Visit: Payer: Self-pay | Admitting: Family Medicine

## 2015-10-25 VITALS — BP 116/80 | HR 90 | Ht 67.0 in | Wt 150.0 lb

## 2015-10-25 DIAGNOSIS — N401 Enlarged prostate with lower urinary tract symptoms: Secondary | ICD-10-CM | POA: Diagnosis not present

## 2015-10-25 DIAGNOSIS — R310 Gross hematuria: Secondary | ICD-10-CM

## 2015-10-25 DIAGNOSIS — N138 Other obstructive and reflux uropathy: Secondary | ICD-10-CM

## 2015-10-25 NOTE — Progress Notes (Signed)
10/25/2015 11:50 AM   Gerald Montgomery [birth year] [id number]  Referring provider: Roselee Nova, MD [address] [place], [place] [address]  Chief Complaint  Patient presents with  . Hematuria    (gross) referred by Dr. Brigitte Montgomery    HPI: Patient is a 60 -year-old Caucasian male who presents today as a referral from their PCP, Dr. Manuella Montgomery, for gross hematuria.  He is a poor historian due to his CP.  His mother and sister are present.  Patient was found to have gross hematuria one month ago.   Urine culture from 3 weeks ago was negative.  Patient does have a prior history of hematuria.    He does have a prior history of recurrent urinary tract infections in the remote past.  He has not had nephrolithiasis, trauma to the genitourinary tract, BPH or malignancies of the genitourinary tract.   He does not have a family medical history of nephrolithiasis, malignancies of the genitourinary tract or hematuria.   Today, he are not having symptoms of frequent urination, urgency, nocturia, incontinence, hesitancy, intermittency, straining to urinate or a weak urinary stream.  He has intermittent dysuria.  His UA today demonstrates 3-10 RBC's/hpf.  She is not experiencing any suprapubic pain, abdominal pain or flank pain. He denies any recent fevers, chills, nausea or vomiting.   He underwent a hematuria work up with Gerald Montgomery in 2011.  No GU malignancies were discovered, but his sister states that Gerald Montgomery went into his bladder and "washed it out."    He is not a smoker.   He is not exposed to secondhand smoke.  He has not worked with Sports [employer].    PMH: Past Medical History  Diagnosis Date  . History of DVT of lower extremity   . Urinary incontinence   . Mood disorder ([place])   . Neurogenic bladder   . Depression     Surgical History: Past Surgical History  Procedure Laterality Date  . Hip fracture surgery  2013  . Filter surgery for blood clot  2013    Home  Medications:    Medication List       This list is accurate as of: 10/25/15 11:50 AM.  Always use your most recent med list.               ARIPiprazole 15 MG tablet  Commonly known as:  ABILIFY  Take 1 tablet (15 mg total) by mouth daily.     bethanechol 50 MG tablet  Commonly known as:  URECHOLINE  TAKE 1 TABLET(50 MG) BY MOUTH THREE TIMES DAILY     nitrofurantoin (macrocrystal-monohydrate) 100 MG capsule  Commonly known as:  MACROBID  Take 1 capsule (100 mg total) by mouth 2 (two) times daily.     warfarin 5 MG tablet  Commonly known as:  COUMADIN  TAKE 1 TABLET(5 MG) BY MOUTH DAILY        Allergies:  Allergies  Allergen Reactions  . Penicillins     Sickness/sweating    Family History: Family History  Problem Relation Age of Onset  . Heart disease Mother   . Atrial fibrillation Mother   . Diabetes Mother     Borderline  . Heart disease Father     Passed away of heart attack  . Kidney disease Neg Hx   . Prostate cancer Neg Hx     Social History:  reports that he has never smoked. He has never used smokeless tobacco.  He reports that he does not drink alcohol or use illicit drugs.  ROS: UROLOGY Frequent Urination?: No Hard to postpone urination?: No Burning/pain with urination?: Yes Get up at night to urinate?: No Leakage of urine?: No Urine stream starts and stops?: No Trouble starting stream?: No Do you have to strain to urinate?: No Blood in urine?: Yes Urinary tract infection?: No Sexually transmitted disease?: No Injury to kidneys or bladder?: No Painful intercourse?: No Weak stream?: No Erection problems?: No Penile pain?: No  Gastrointestinal Nausea?: No Vomiting?: No Indigestion/heartburn?: No Diarrhea?: No Constipation?: No  Constitutional Fever: No Night sweats?: No Weight loss?: No Fatigue?: No  Skin Skin rash/lesions?: No Itching?: No  Eyes Blurred vision?: No Double vision?: No  Ears/Nose/Throat Sore throat?:  No Sinus problems?: No  Hematologic/Lymphatic Swollen glands?: No Easy bruising?: No  Cardiovascular Leg swelling?: No Chest pain?: No  Respiratory Cough?: No Shortness of breath?: No  Endocrine Excessive thirst?: No  Musculoskeletal Back pain?: No Joint pain?: No  Neurological Headaches?: No Dizziness?: No  Psychologic Depression?: No Anxiety?: No  Physical Exam: BP 116/80 mmHg  Montgomery 90  Ht 5\' 7"  (1.702 m)  Wt 150 lb (68.04 kg)  BMI 23.49 kg/m2  Constitutional: Well nourished. Alert and oriented, No acute distress. HEENT: [place] AT, moist mucus membranes. Trachea midline, no masses. Cardiovascular: No clubbing, cyanosis, or edema. Respiratory: Normal respiratory effort, no increased work of breathing. GI: Abdomen is soft, non tender, non distended, no abdominal masses. Liver and spleen not palpable.  No hernias appreciated.  Stool sample for occult testing is not indicated.   GU: No CVA tenderness.  No bladder fullness or masses.  Patient could not ambulate, so could not complete entire GU exam.   Rectal: Deferred.   Skin: No rashes, bruises or suspicious lesions. Lymph: No cervical or inguinal adenopathy. Neurologic: Grossly intact, no focal deficits, moving all 4 extremities. Psychiatric: Normal mood and affect.  Laboratory Data: Lab Results  Component Value Date   WBC 7.6 10/07/2015   HGB 13.4 01/25/2014   HCT 40.9 10/07/2015   MCV 87 10/07/2015   PLT 180 10/07/2015    Lab Results  Component Value Date   CREATININE 0.47* 10/07/2015    Lab Results  Component Value Date   AST 72* 10/07/2015   Lab Results  Component Value Date   ALT 95* 10/07/2015    Urinalysis Results for orders placed or performed in visit on 10/25/15  CULTURE, URINE COMPREHENSIVE  Result Value Ref Range   Urine Culture, Comprehensive Final report (A)    Result 1 Morganella morganii (A)    Result 2 Comment    ANTIMICROBIAL SUSCEPTIBILITY Comment   Microscopic  Examination  Result Value Ref Range   WBC, UA >30 (A) 0 -  5 /hpf   RBC, UA 3-10 (A) 0 -  2 /hpf   Epithelial Cells (non renal) 0-10 0 - 10 /hpf   Bacteria, UA Moderate (A) None seen/Few  Urinalysis, Complete  Result Value Ref Range   Specific Gravity, UA 1.025 1.005 - 1.030   pH, UA 6.5 5.0 - 7.5   Color, UA Yellow Yellow   Appearance Ur Cloudy (A) Clear   Leukocytes, UA 1+ (A) Negative   Protein, UA 1+ (A) Negative/Trace   Glucose, UA Negative Negative   Ketones, UA Negative Negative   RBC, UA Trace (A) Negative   Bilirubin, UA Negative Negative   Urobilinogen, Ur 0.2 0.2 - 1.0 mg/dL   Nitrite, UA Positive (A) Negative  Microscopic Examination See below:   PSA  Result Value Ref Range   Prostate Specific Ag, Serum 1.1 0.0 - 4.0 ng/mL  BUN+Creat  Result Value Ref Range   BUN 22 6 - 24 mg/dL   Creatinine, Ser 0.53 (L) 0.76 - 1.27 mg/dL   GFR calc non Af Amer 117 >59 mL/min/1.73   GFR calc Af Amer 135 >59 mL/min/1.73   BUN/Creatinine Ratio 42 (H) 9 - 20    Pertinent Imaging: PRIOR REPORT IMPORTED FROM THE [facility] WORKFLOW SYSTEM   REASON FOR EXAM: hematuria  COMMENTS:   PROCEDURE: CT - CT ABDOMEN / PELVIS W/WO - Oct 09 2009 4:39PM   RESULT: A triphasic CT scan was performed through the abdomen and  pelvis  at 3 mm intervals and slice thicknesses. Review of multiplanar  reconstructed  images was performed separately on the VIA monitor.   On the noncontrast images the kidneys are normal in density and contour  with  no evidence of obstruction nor of calcified stones. The partially  distended  urinary bladder exhibits wall thickening posteriorly which is demonstrated  to better advantage on post contrast and delayed images.   Following intravenous administration of 100 cc of Isovue 370 the kidneys  enhance well. No parenchymal masses are identified. On delayed images the  renal parenchyma continues to exhibit normal density. A  subcentimeter  hypodensity in the midpole medially on the right is most compatible with a  subcentimeter cyst. It is too small to accurately characterize with  Hounsfield measurements. The urinary bladder is partially distended on the  delayed images. Bilateral ureteral jets are demonstrated. Again  demonstrated  is irregular thickening of the posterior urinary bladder wall.   The liver, gallbladder, pancreas, spleen, and adrenal glands are normal in  appearance. There is a large hiatal hernia-partially intrathoracic  stomach.  The caliber of the abdominal aorta is normal. I see no periaortic or  pericaval lymphadenopathy. There is a moderate amount of stool present in  the rectosigmoid colon. I do not see evidence of bowel obstruction nor  inflammation. The lung bases exhibit mild the atelectasis posteriorly in  the  lower lobes. The lumbar vertebral bodies are preserved in height.   IMPRESSION:  1. There is thickening of the posterior wall of the urinary bladder. This  is  not producing obstruction of the ureterovesical junctions.  2. I do not see suspicious lesions within the kidneys. A subcentimeter  hypodensity medially in the midpole cortex of the right kidney most likely  reflects a cyst. No stones are identified within either kidney and no  perinephric inflammatory change is seen.  3. I do not see evidence of acute hepatobiliary abnormality nor acute  bowel  abnormality. I cannot exclude an element of constipation in the  appropriate  clinical setting however.  4. There is a large hiatal hernia-partially intrathoracic stomach.     Assessment & Plan:    1. Gross hematuria:   I explained to the patient and his family that there are a number of causes that can be associated with blood in the urine, such as stones,  BPH, UTI's, damage to the urinary tract and/or cancer.  At this time, I felt that the patient warranted further urologic  evaluation.   The AUA guidelines state that a CT urogram is the preferred imaging study to evaluate hematuria.  I explained to the patient and his family that a contrast material will be injected into a vein and that in rare instances,  an allergic reaction can result and may even life threatening   The patient denies any allergies to contrast, iodine and/or seafood and is not taking metformin.  The patient and family had the opportunity to ask questions which were answered. Based upon this discussion, the patient is willing to proceed. Therefore, I've ordered: a CT Urogram.  He will return following all of the above for discussion of the results.     - Urinalysis, Complete - CULTURE, URINE COMPREHENSIVE  2. BPH with LUTS:   PSA is drawn today.     Return for CT Urogram report.  These notes generated with voice recognition software. I apologize for typographical errors.  Zara Council, [facility] [address], [place], [place] [address] [phone]

## 2015-10-26 LAB — BUN+CREAT
BUN/Creatinine Ratio: 42 — ABNORMAL HIGH (ref 9–20)
BUN: 22 mg/dL (ref 6–24)
Creatinine, Ser: 0.53 mg/dL — ABNORMAL LOW (ref 0.76–1.27)
GFR calc Af Amer: 135 mL/min/{1.73_m2} (ref >59)
GFR calc non Af Amer: 117 mL/min/{1.73_m2} (ref >59)

## 2015-10-26 LAB — PSA: PROSTATE SPECIFIC AG, SERUM: 1.1 ng/mL (ref 0.0–4.0)

## 2015-10-28 ENCOUNTER — Telehealth: Payer: Self-pay

## 2015-10-28 ENCOUNTER — Telehealth: Payer: Self-pay | Admitting: Urology

## 2015-10-28 DIAGNOSIS — N390 Urinary tract infection, site not specified: Secondary | ICD-10-CM

## 2015-10-28 LAB — CULTURE, URINE COMPREHENSIVE

## 2015-10-28 MED ORDER — SULFAMETHOXAZOLE-TRIMETHOPRIM 800-160 MG PO TABS: 1.0000 | ORAL_TABLET | Freq: Two times a day (BID) | ORAL | Status: DC

## 2015-10-28 MED ORDER — CIPROFLOXACIN HCL 500 MG PO TABS: 500.0000 mg | ORAL_TABLET | Freq: Two times a day (BID) | ORAL | Status: DC

## 2015-10-28 NOTE — Telephone Encounter (Signed)
Patient's daughter called and said that he can not take the septra that you called in because he is allergic to sulfa. This was not documented in his chart. We need to document this and call him in something else. She also said that whatever you call in we need to make sure it doesn't interact with his warfin he is taking.   Thanks, michelle

## 2015-10-28 NOTE — Telephone Encounter (Signed)
Patient's sister was notified of this and Cipro was sent to patient's pharmacy

## 2015-10-28 NOTE — Telephone Encounter (Signed)
-----   Message from Nori Riis, PA-C sent at 10/28/2015 12:02 PM EDT ----- Patient has a +UCx.  They need to start Septra DS,  one tablet twice daily for seven days.

## 2015-10-28 NOTE — Telephone Encounter (Signed)
Spoke with pt mother in reference to +ucx. Mother voiced understanding. Abx were sent to pharmacy.

## 2015-10-28 NOTE — Telephone Encounter (Signed)
-----   Message from Nori Riis, PA-C sent at 10/27/2015  4:39 PM EDT ----- Patient's PSA is normal.  Urine culture is still pending.

## 2015-10-28 NOTE — Telephone Encounter (Signed)
Because of his resistance pattern and allergy to PCN, the only other antibiotics he can take interact with his warfarin.  I suggest contacting his PCP and tell them that we need to prescribe Cipro 500 mg twice daily for seven days and how can he manage his warfarin.

## 2015-10-29 ENCOUNTER — Telehealth: Payer: Self-pay | Admitting: Family Medicine

## 2015-10-29 DIAGNOSIS — Z86718 Personal history of other venous thrombosis and embolism: Secondary | ICD-10-CM

## 2015-10-29 MED ORDER — WARFARIN SODIUM 3 MG PO TABS: 3.0000 mg | ORAL_TABLET | Freq: Every day | ORAL | Status: DC

## 2015-10-29 NOTE — Telephone Encounter (Signed)
Pts mother would like a call back about getting a nurse to come out to monitor the pt while he is on Warfarin. Mom is worried about pts blood getting to thin.

## 2015-10-29 NOTE — Telephone Encounter (Signed)
Patient was prescribed ciprofloxacin for a UTI, mother concerned about ciprofloxacin interacting with Coumadin possibly raising INR. Confirmed that ciprofloxacin can increase his INR, which can consequently increase his risk of bleeding and recommended that he should lower the dose of Coumadin from 5 mg to 3 mg every night for the duration of his therapy with Ciprofloxacin (which is given for 7 days). At end of 7 days, will repeat INR and start on the usual (5 mg) of Coumadin. Patient's mother verbalized agreement

## 2015-11-04 ENCOUNTER — Telehealth: Payer: Self-pay | Admitting: Family Medicine

## 2015-11-04 NOTE — Telephone Encounter (Signed)
Pts mom called and states she needs for [facility] to come out and read pts Warfarin.

## 2015-11-04 NOTE — Telephone Encounter (Signed)
[facility] is scheduled to return on June 13 (7 days after completion of antibiotic therapy).

## 2015-11-07 ENCOUNTER — Telehealth: Payer: Self-pay

## 2015-11-07 ENCOUNTER — Telehealth: Payer: Self-pay | Admitting: Family Medicine

## 2015-11-07 NOTE — Telephone Encounter (Signed)
Antonietta Breach returned your call. Wanted to inform you that she is in [facility] and would like to know if you wanted the PT/INR done today? Her number is [phone]

## 2015-11-07 NOTE — Telephone Encounter (Signed)
Per Dr. Manuella Ghazi I called Antonietta Breach and left a voice message asking to please check patient's PT/INR today and to please contact us with results

## 2015-11-07 NOTE — Telephone Encounter (Signed)
CARE GIVER SAID THAT NO ONE CAME TO CHECK HIS PT/INR LAST WEEK AND STILL HAVE NOT SEEN ANYONE AND SHE WAS AFRAID TO GIVE HIM HIS COUMDIN WITHOUT IT BEING CHECKED. IT IS USUALLY [facility] FROM [facility] AND [facility] SAID THAT SHE WAS NOT DUE FOR 1 MONTH BUT THE PHARM TOLD THEM THAT HIS LEVELS WOULD NEED TO BE CHECKED AND FOLLOWED CLOSELY AND A MESSAGE WAS GIVEN.  DR [facility] CAME UP WHILE TAKING THIS MESSAGE AND WROTE A NOTE FOR HIS NURSE TO CALL [facility] AND GIVE AN ORDER FOR THEM TO GO OUT TODAY 11-07-15 AND DO A PT/ INR . THE CARE GIVER WAS TOLD THIS WHILE HOLDING ON THE PHONE.

## 2015-11-07 NOTE — Telephone Encounter (Signed)
Routed to Dr. Manuella Ghazi with PT/INR results

## 2015-11-07 NOTE — Telephone Encounter (Signed)
INR is therapeutic, patient to resume Coumadin 5 mg at bedtime, recheck INR in 4 weeks. Gerald Montgomery verbalized understanding

## 2015-11-22 ENCOUNTER — Ambulatory Visit: Payer: Self-pay

## 2015-11-22 ENCOUNTER — Ambulatory Visit
Admission: RE | Admit: 2015-11-22 | Discharge: 2015-11-22 | Disposition: A | Discharge status: Home / Self Care | Payer: Medicare Other | Source: Ambulatory Visit | Attending: Urology | Admitting: Urology

## 2015-11-22 DIAGNOSIS — N323 Diverticulum of bladder: Secondary | ICD-10-CM | POA: Diagnosis not present

## 2015-11-22 DIAGNOSIS — R310 Gross hematuria: Secondary | ICD-10-CM | POA: Insufficient documentation

## 2015-11-22 DIAGNOSIS — I700 Atherosclerosis of aorta: Secondary | ICD-10-CM | POA: Insufficient documentation

## 2015-11-22 DIAGNOSIS — N289 Disorder of kidney and ureter, unspecified: Secondary | ICD-10-CM | POA: Insufficient documentation

## 2015-11-22 DIAGNOSIS — K449 Diaphragmatic hernia without obstruction or gangrene: Secondary | ICD-10-CM | POA: Diagnosis not present

## 2015-11-22 MED ORDER — IOPAMIDOL (ISOVUE-370) INJECTION 76%
100.0000 mL | Freq: Once | INTRAVENOUS | Status: AC | PRN
  Administered 2015-11-22: 100 mL via INTRAVENOUS

## 2015-11-25 ENCOUNTER — Other Ambulatory Visit: Payer: Self-pay | Admitting: Family Medicine

## 2015-11-28 ENCOUNTER — Other Ambulatory Visit: Payer: Self-pay | Admitting: Family Medicine

## 2015-11-28 MED ORDER — BETHANECHOL CHLORIDE 50 MG PO TABS: 50.0000 mg | ORAL_TABLET | Freq: Three times a day (TID) | ORAL | Status: DC

## 2015-11-28 NOTE — Telephone Encounter (Signed)
Pt needs refill on his bladder medication.

## 2015-11-29 ENCOUNTER — Ambulatory Visit (INDEPENDENT_AMBULATORY_CARE_PROVIDER_SITE_OTHER): Payer: Medicare Other | Admitting: Urology

## 2015-11-29 ENCOUNTER — Encounter: Payer: Self-pay | Admitting: Urology

## 2015-11-29 VITALS — BP 123/83 | HR 79 | Ht 68.0 in | Wt 140.0 lb

## 2015-11-29 DIAGNOSIS — K5900 Constipation, unspecified: Secondary | ICD-10-CM

## 2015-11-29 DIAGNOSIS — R310 Gross hematuria: Secondary | ICD-10-CM | POA: Diagnosis not present

## 2015-11-29 DIAGNOSIS — N401 Enlarged prostate with lower urinary tract symptoms: Secondary | ICD-10-CM

## 2015-11-29 DIAGNOSIS — N138 Other obstructive and reflux uropathy: Secondary | ICD-10-CM

## 2015-11-29 LAB — MICROSCOPIC EXAMINATION

## 2015-11-29 LAB — URINALYSIS, COMPLETE
BILIRUBIN UA: NEGATIVE
GLUCOSE, UA: NEGATIVE
KETONES UA: NEGATIVE
NITRITE UA: POSITIVE — AB
Protein, UA: NEGATIVE
SPEC GRAV UA: 1.02 (ref 1.005–1.030)
Urobilinogen, Ur: 0.2 mg/dL (ref 0.2–1.0)
pH, UA: 7 (ref 5.0–7.5)

## 2015-11-29 NOTE — Progress Notes (Signed)
11:15 AM   Gerald Montgomery 07-05-1955 [id number]  Referring provider: Roselee Nova, MD [address] [place], [place] [address]  Chief Complaint  Patient presents with  . Results    CT Urogram    HPI: Patient is a 89 WM who presents to discuss CT Urogram results.  Background Patient who presented today as a referral from their PCP, Dr. Manuella Ghazi, for gross hematuria.  He is a poor historian due to his CP.  His mother and sister are present.  Patient was found to have gross hematuria one month ago.   Urine culture from 3 weeks ago was negative.  Patient does have a prior history of hematuria.  He does have a prior history of recurrent urinary tract infections in the remote past.  He has not had nephrolithiasis, trauma to the genitourinary tract, BPH or malignancies of the genitourinary tract.  He does not have a family medical history of nephrolithiasis, malignancies of the genitourinary tract or hematuria.   Today, he are not having symptoms of frequent urination, urgency, nocturia, incontinence, hesitancy, intermittency, straining to urinate or a weak urinary stream.  He has intermittent dysuria.  His UA today demonstrates 3-10 RBC's/hpf.  He is not experiencing any suprapubic pain, abdominal pain or flank pain. He denies any recent fevers, chills, nausea or vomiting.   He underwent a hematuria work up with Dr. Yves Dill in 2011.  No GU malignancies were discovered, but his sister states that Dr. Yves Dill went into his bladder and "washed it out."    He is not a smoker.   He is not exposed to secondhand smoke.  He has not worked with Sports [employer].    PMH: Past Medical History  Diagnosis Date  . History of DVT of lower extremity   . Urinary incontinence   . Mood disorder ([place])   . Neurogenic bladder   . Depression     Surgical History: Past Surgical History  Procedure Laterality Date  . Hip fracture surgery  2013  . Filter surgery for blood clot  2013     Home Medications:    Medication List       This list is accurate as of: 11/29/15 11:15 AM.  Always use your most recent med list.               ARIPiprazole 15 MG tablet  Commonly known as:  ABILIFY  TAKE 1 TABLET(15 MG) BY MOUTH DAILY     bethanechol 50 MG tablet  Commonly known as:  URECHOLINE  Take 1 tablet (50 mg total) by mouth 3 (three) times daily.     ciprofloxacin 500 MG tablet  Commonly known as:  CIPRO  Take 1 tablet (500 mg total) by mouth every 12 (twelve) hours.     nitrofurantoin (macrocrystal-monohydrate) 100 MG capsule  Commonly known as:  MACROBID  Take 1 capsule (100 mg total) by mouth 2 (two) times daily.     sulfamethoxazole-trimethoprim 800-160 MG tablet  Commonly known as:  BACTRIM DS,SEPTRA DS  Reported on 11/29/2015     warfarin 5 MG tablet  Commonly known as:  COUMADIN  TAKE 1 TABLET(5 MG) BY MOUTH DAILY     warfarin 3 MG tablet  Commonly known as:  COUMADIN  Take 1 tablet (3 mg total) by mouth daily at 6 PM.        Allergies:  Allergies  Allergen Reactions  . Penicillins     Sickness/sweating  . Sulfa  Antibiotics Itching    Family History: Family History  Problem Relation Age of Onset  . Heart disease Mother   . Atrial fibrillation Mother   . Diabetes Mother     Borderline  . Heart disease Father     Passed away of heart attack  . Kidney disease Neg Hx   . Prostate cancer Neg Hx     Social History:  reports that he has never smoked. He has never used smokeless tobacco. He reports that he does not drink alcohol or use illicit drugs.  ROS: UROLOGY Frequent Urination?: No Hard to postpone urination?: No Burning/pain with urination?: No Get up at night to urinate?: No Leakage of urine?: No Urine stream starts and stops?: No Trouble starting stream?: No Do you have to strain to urinate?: No Blood in urine?: No Urinary tract infection?: No Sexually transmitted disease?: No Injury to kidneys or bladder?: No Painful  intercourse?: No Weak stream?: No Erection problems?: No Penile pain?: No  Gastrointestinal Nausea?: No Vomiting?: No Indigestion/heartburn?: No Diarrhea?: No Constipation?: No  Constitutional Fever: No Night sweats?: No Weight loss?: No Fatigue?: No  Skin Skin rash/lesions?: No Itching?: No  Eyes Blurred vision?: No Double vision?: No  Ears/Nose/Throat Sore throat?: No Sinus problems?: No  Hematologic/Lymphatic Swollen glands?: No Easy bruising?: Yes  Cardiovascular Leg swelling?: No Chest pain?: No  Respiratory Cough?: No Shortness of breath?: No  Endocrine Excessive thirst?: No  Musculoskeletal Back pain?: No Joint pain?: No  Neurological Headaches?: No Dizziness?: No  Psychologic Depression?: No Anxiety?: Yes  Physical Exam: BP 123/83 mmHg  Pulse 79  Ht 5\' 8"  (1.727 m)  Wt 140 lb (63.504 kg)  BMI 21.29 kg/m2  Constitutional: Well nourished. Alert and oriented, No acute distress. HEENT: Walker AT, moist mucus membranes. Trachea midline, no masses. Cardiovascular: No clubbing, cyanosis, or edema. Respiratory: Normal respiratory effort, no increased work of breathing. GI: Abdomen is soft, non tender, non distended, no abdominal masses. Liver and spleen not palpable.  No hernias appreciated.  Stool sample for occult testing is not indicated.   GU: No CVA tenderness.  No bladder fullness or masses.  Patient could not ambulate, so could not complete entire GU exam.   Rectal: Deferred.   Skin: No rashes, bruises or suspicious lesions. Lymph: No cervical or inguinal adenopathy. Neurologic: Grossly intact, no focal deficits, moving all 4 extremities. Psychiatric: Normal mood and affect.  Laboratory Data: Lab Results  Component Value Date   WBC 7.6 10/07/2015   HGB 13.4 01/25/2014   HCT 40.9 10/07/2015   MCV 87 10/07/2015   PLT 180 10/07/2015    Lab Results  Component Value Date   CREATININE 0.53* 10/25/2015    Lab Results  Component  Value Date   AST 72* 10/07/2015   Lab Results  Component Value Date   ALT 95* 10/07/2015    Urinalysis Results for orders placed or performed in visit on 11/29/15  Microscopic Examination  Result Value Ref Range   WBC, UA >30 (A) 0 -  5 /hpf   RBC, UA 3-10 (A) 0 -  2 /hpf   Epithelial Cells (non renal) 0-10 0 - 10 /hpf   Bacteria, UA Few None seen/Few  Urinalysis, Complete  Result Value Ref Range   Specific Gravity, UA 1.020 1.005 - 1.030   pH, UA 7.0 5.0 - 7.5   Color, UA Yellow Yellow   Appearance Ur Cloudy (A) Clear   Leukocytes, UA 3+ (A) Negative   Protein, UA Negative  Negative/Trace   Glucose, UA Negative Negative   Ketones, UA Negative Negative   RBC, UA Trace (A) Negative   Bilirubin, UA Negative Negative   Urobilinogen, Ur 0.2 0.2 - 1.0 mg/dL   Nitrite, UA Positive (A) Negative   Microscopic Examination See below:     Pertinent Imaging: CLINICAL DATA: Gross hematuria. Frequent urinary tract infections. History of neurogenic bladder.  EXAM: CT ABDOMEN AND PELVIS WITHOUT AND WITH CONTRAST  TECHNIQUE: Multidetector CT imaging of the abdomen and pelvis was performed following the standard protocol before and following the bolus administration of intravenous contrast.  CONTRAST: 100 ml Isovue 370.  COMPARISON: CT 10/09/2009.  FINDINGS: Lower chest: Stable mild dependent atelectasis at both lung bases. There is a moderate size hiatal hernia. No significant pleural or pericardial effusion.  Hepatobiliary: The liver appears stable without suspicious findings. No evidence of gallstones, gallbladder wall thickening or biliary dilatation.  Pancreas: Unremarkable. No pancreatic ductal dilatation or surrounding inflammatory changes.  Spleen: Normal in size without focal abnormality.  Adrenals/Urinary Tract: Both adrenal glands appear normal. Pre-contrast images demonstrate no renal, ureteral or bladder calculi. Post-contrast, both kidneys  enhance normally. There is no evidence of enhancing renal mass. There are sub cm low-density lesions in both kidneys which are too small to optimally characterize, but similar to the prior study and likely all cysts. Delayed images result in segmental visualization of the ureters. No urothelial abnormalities are identified. The bladder is distended with mild wall thickening. There is a 1.3 cm bladder diverticulum on the left. No focal mucosal abnormalities are seen.  Stomach/Bowel: As above, there is a moderate size hiatal hernia. The stomach and small bowel otherwise appear normal. There is large amount of stool throughout the colon, especially within the rectum which is markedly distended. There is some perirectal soft tissue stranding without focal fluid collection or significant wall thickening.  Vascular/Lymphatic: There are no enlarged abdominal or pelvic lymph nodes. Aortic and branch vessel atherosclerosis noted.  Reproductive: The prostate gland and seminal vesicles demonstrate no significant findings.  Other: The anterior abdominal wall appears stable. No ascites.  Musculoskeletal: No acute or significant osseous findings. There is posttraumatic deformity of the proximal left femur status post interval dynamic screw and plate ORIF. There are bilateral L5 pars defects with mild biforaminal narrowing at L5-S1.  IMPRESSION: 1. No evidence of urinary tract calculus or hydronephrosis. 2. No evidence of complicated urinary tract infection. The bladder is distended with wall thickening and small diverticula, consistent with neurogenic bladder. 3. No suspicious renal findings. Stable small low density renal lesions bilaterally, likely cysts. 4. Moderate size hiatal hernia. Probable fecal impaction of the rectum with perirectal soft tissue stranding. 5. Aortic atherosclerosis. 6. Bilateral L5 pars defects.   Electronically Signed  By: Richardean Sale M.D.  On:  11/22/2015 12:48       Assessment & Plan:    1. Gross hematuria:   Patient has completed the first step of his hematuria work up with the CT Urogram.  There was no opacification of the left ureter in his entirety.  We did discuss undergoing cystoscopy with retrogrades in the OR vs a cystoscopy in the office.  His mother and sister understand that there is a small risk an urological tumor may be in the left ureter, but they would not like to risk the general anesthesia.  They want to pursue a cystoscopy in the office.   Following the imaging study,  I've recommended a cystoscopy. I described how this is performed,  typically in an office setting with a flexible cystoscope. We described the risks, benefits, and possible side effects, the most common of which is a minor amount of blood in the urine and/or burning which usually resolves in 24 to 48 hours.   Urine is sent for culture to assist in prophylactic choice of antibiotic prior to cystoscopy.    - Urinalysis, Complete - CULTURE, URINE COMPREHENSIVE  2. BPH with LUTS:   PSA was 1.1 ng/mL on 10/25/2015.    3. Constipation:   Advised patient and his family members to use [employer] everyday.  He should speak with his PCP for further management.      Return for cystoscopy for hematuria.  These notes generated with voice recognition software. I apologize for typographical errors.  Zara Council, [facility] [address] [place], [place] [address] [phone]

## 2015-11-29 NOTE — Patient Instructions (Signed)
Cystoscopy  Cystoscopy is a procedure that is used to help your caregiver diagnose and sometimes treat conditions that affect your lower urinary tract. Your lower urinary tract includes your bladder and the tube through which urine passes from your bladder out of your body (urethra). Cystoscopy is performed with a thin, tube-shaped instrument (cystoscope). The cystoscope has lenses and a light at the end so that your caregiver can see inside your bladder. The cystoscope is inserted at the entrance of your urethra. Your caregiver guides it through your urethra and into your bladder. There are two main types of cystoscopy:  · Flexible cystoscopy (with a flexible cystoscope).  · Rigid cystoscopy (with a rigid cystoscope).  Cystoscopy may be recommended for many conditions, including:  · Urinary tract infections.  · Blood in your urine (hematuria).  · Loss of bladder control (urinary incontinence) or overactive bladder.  · Unusual cells found in a urine sample.  · Urinary blockage.  · Painful urination.  Cystoscopy may also be done to remove a sample of your tissue to be checked under a microscope (biopsy). It may also be done to remove or destroy bladder stones.  LET YOUR CAREGIVER KNOW ABOUT:  · Allergies to food or medicine.  · Medicines taken, including vitamins, herbs, eyedrops, over-the-counter medicines, and creams.  · Use of steroids (by mouth or creams).  · Previous problems with anesthetics or numbing medicines.  · History of bleeding problems or blood clots.  · Previous surgery.  · Other health problems, including diabetes and kidney problems.  · Possibility of pregnancy, if this applies.  PROCEDURE  The area around the opening to your urethra will be cleaned. A medicine to numb your urethra (local anesthetic) is used. If a tissue sample or stone is removed during the procedure, you may be given a medicine to make you sleep (general anesthetic).  Your caregiver will gently insert the tip of the cystoscope  into your urethra. The cystoscope will be slowly glided through your urethra and into your bladder. Sterile fluid will flow through the cystoscope and into your bladder. The fluid will expand and stretch your bladder. This gives your caregiver a better view of your bladder walls. The procedure lasts about 15-20 minutes.  AFTER THE PROCEDURE  If a local anesthetic is used, you will be allowed to go home as soon as you are ready. If a general anesthetic is used, you will be taken to a recovery area until you are stable. You may have temporary bleeding and burning on urination.     This information is not intended to replace advice given to you by your health care provider. Make sure you discuss any questions you have with your health care provider.     Document Released: 05/08/2000 Document Revised: 06/01/2014 Document Reviewed: 11/02/2011  Elsevier Interactive Patient Education ©2016 [employer]

## 2015-12-01 LAB — CULTURE, URINE COMPREHENSIVE

## 2015-12-04 ENCOUNTER — Telehealth: Payer: Self-pay | Admitting: Family Medicine

## 2015-12-04 LAB — URINALYSIS, COMPLETE
Bilirubin, UA: NEGATIVE
Glucose, UA: NEGATIVE
Ketones, UA: NEGATIVE
Nitrite, UA: POSITIVE — AB
PH UA: 6.5 (ref 5.0–7.5)
Specific Gravity, UA: 1.025 (ref 1.005–1.030)
Urobilinogen, Ur: 0.2 mg/dL (ref 0.2–1.0)

## 2015-12-04 LAB — MICROSCOPIC EXAMINATION: WBC, UA: >30 /hpf — AB (ref 0–?)

## 2015-12-04 NOTE — Telephone Encounter (Signed)
INR is supratherapeutic, advised to hold Coumadin starting tonight through Saturday night (7/12-7/15) and recheck INR on Sunday, 716.

## 2015-12-04 NOTE — Telephone Encounter (Signed)
[facility] states she will not do a blood draw today patient did not want it.  PT: 51.8 INR: 4.3 (high) He takes 5 mg of coumodin daily.

## 2015-12-05 ENCOUNTER — Telehealth: Payer: Self-pay | Admitting: Family Medicine

## 2015-12-05 DIAGNOSIS — G809 Cerebral palsy, unspecified: Secondary | ICD-10-CM | POA: Insufficient documentation

## 2015-12-05 NOTE — Telephone Encounter (Signed)
Tiffany (caretaker) states caseworker has all the paperwork taken care of on his end for a wheelchair, but he is needing a actual prescription for the wheelchair. [place] mom can come by to pick it up.

## 2015-12-05 NOTE — Telephone Encounter (Signed)
Spoke with Bonnita Nasuti and she will pick it up tomorrow.

## 2015-12-05 NOTE — Telephone Encounter (Signed)
Prescription for wheelchair is ready for pickup 

## 2015-12-09 ENCOUNTER — Telehealth: Payer: Self-pay | Admitting: Urology

## 2015-12-09 NOTE — Telephone Encounter (Signed)
Please advise. 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

## 2015-12-09 NOTE — Telephone Encounter (Signed)
Unless the patient is having fevers, complains of pain with urination, but in the urine or is exhibiting unusual behavior, we do not recommend checking the urine or sending a urine for culture.  I would encourage the patient to crease his fluid intake which would also help with his constipation.

## 2015-12-09 NOTE — Telephone Encounter (Signed)
Pt's home health nurse called and stated pt's urine was cloudy yesterday.  They were wondering if he needs to bring urine in?  [phone] [facility]

## 2015-12-09 NOTE — Telephone Encounter (Signed)
Spoke with pt mother in reference to cloudy urine. Mother denied pt to have fever, dysuria, or mental status change. Encouraged mother to have pt drink more fluids. Made mother aware due to pt not having symptoms [facility] was not going to treat or send urine for cx. Mother voiced understanding.

## 2015-12-10 ENCOUNTER — Telehealth: Payer: Self-pay

## 2015-12-10 NOTE — Telephone Encounter (Signed)
-----   Message from Nori Riis, PA-C sent at 12/09/2015  5:25 AM EDT ----- Please notify the family that his urine culture is negative.  We will see him in August for his cystoscopy.

## 2015-12-10 NOTE — Telephone Encounter (Signed)
Spoke with pt mother in reference ucx results. Mother voiced understanding.

## 2015-12-19 ENCOUNTER — Telehealth: Payer: Self-pay | Admitting: Family Medicine

## 2015-12-19 NOTE — Telephone Encounter (Signed)
INR is therapeutic, patient to continue on Coumadin 5 mg daily. Recheck in one month

## 2016-01-01 ENCOUNTER — Telehealth: Payer: Self-pay | Admitting: Family Medicine

## 2016-01-01 ENCOUNTER — Ambulatory Visit (INDEPENDENT_AMBULATORY_CARE_PROVIDER_SITE_OTHER): Payer: Medicare Other | Admitting: Urology

## 2016-01-01 ENCOUNTER — Encounter: Payer: Self-pay | Admitting: Urology

## 2016-01-01 VITALS — BP 154/83 | HR 97 | Ht 66.0 in

## 2016-01-01 DIAGNOSIS — R310 Gross hematuria: Secondary | ICD-10-CM

## 2016-01-01 DIAGNOSIS — N319 Neuromuscular dysfunction of bladder, unspecified: Secondary | ICD-10-CM | POA: Diagnosis not present

## 2016-01-01 MED ORDER — CIPROFLOXACIN HCL 500 MG PO TABS
500.0000 mg | ORAL_TABLET | Freq: Once | ORAL | Status: AC
  Administered 2016-01-01: 500 mg via ORAL

## 2016-01-01 MED ORDER — LIDOCAINE HCL 2 % EX GEL
1.0000 "application " | Freq: Once | CUTANEOUS | Status: AC
  Administered 2016-01-01: 1 via URETHRAL

## 2016-01-01 NOTE — Addendum Note (Signed)
Addended by: Wilson Singer on: 01/01/2016 04:43 PM   Modules accepted: Orders

## 2016-01-01 NOTE — Progress Notes (Signed)
01/01/2016 9:49 AM   Gerald Montgomery [birth year] [id number]  Referring provider: Roselee Nova, MD [address] [place], [place] [address]  Chief Complaint  Patient presents with  . Cysto    gross hematuria    HPI: Patient who presented today as a referral from their PCP, Dr. Manuella Ghazi, for gross hematuria.  He is a poor historian due to his cerebral palsy.  His mother and sister are present.  Patient was found to have gross hematuria one month ago.   Urine culture from 3 weeks ago was negative.  Patient does have a prior history of hematuria.  He does have a prior history of recurrent urinary tract infections in the remote past.  He has not had nephrolithiasis, trauma to the genitourinary tract, BPH or malignancies of the genitourinary tract.  He does not have a family medical history of nephrolithiasis, malignancies of the genitourinary tract or hematuria.   Today, he are not having symptoms of frequent urination, urgency, nocturia, incontinence, hesitancy, intermittency, straining to urinate or a weak urinary stream.  He has intermittent dysuria.  His UA today demonstrates 3-10 RBC's/hpf.  He is not experiencing any suprapubic pain, abdominal pain or flank pain. He denies any recent fevers, chills, nausea or vomiting.   He underwent a hematuria work up with Dr. Yves Dill in 2011.  No GU malignancies were discovered, but his sister states that Dr. Yves Dill went into his bladder and "washed it out."    He is not a smoker.   He is not exposed to secondhand smoke.  He has not worked with Sports administrator    CT showed no source for gross hematuria.   PMH: Past Medical History:  Diagnosis Date  . Depression   . History of DVT of lower extremity   . Mood disorder ([place])   . Neurogenic bladder   . Urinary incontinence     Surgical History: Past Surgical History:  Procedure Laterality Date  . filter surgery for blood clot  2013  . HIP FRACTURE SURGERY  2013    Home  Medications:    Medication List       Accurate as of 01/01/16  9:49 AM. Always use your most recent med list.          ARIPiprazole 15 MG tablet Commonly known as:  ABILIFY TAKE 1 TABLET(15 MG) BY MOUTH DAILY   bethanechol 50 MG tablet Commonly known as:  URECHOLINE Take 1 tablet (50 mg total) by mouth 3 (three) times daily.   warfarin 5 MG tablet Commonly known as:  COUMADIN TAKE 1 TABLET(5 MG) BY MOUTH DAILY   warfarin 3 MG tablet Commonly known as:  COUMADIN Take 1 tablet (3 mg total) by mouth daily at 6 PM.       Allergies:  Allergies  Allergen Reactions  . Penicillins     Sickness/sweating  . Sulfa Antibiotics Itching    Family History: Family History  Problem Relation Age of Onset  . Heart disease Mother   . Atrial fibrillation Mother   . Diabetes Mother     Borderline  . Heart disease Father     Passed away of heart attack  . Kidney disease Neg Hx   . Prostate cancer Neg Hx     Social History:  reports that he has never smoked. He has never used smokeless tobacco. He reports that he does not drink alcohol or use drugs.  ROS:  Physical Exam: BP (!) 154/83   Pulse 97   Ht 5\' 6"  (1.676 m)   Constitutional:  Alert and oriented, No acute distress. HEENT: [place] AT, moist mucus membranes.  Trachea midline, no masses. Cardiovascular: No clubbing, cyanosis, or edema. Respiratory: Normal respiratory effort, no increased work of breathing. GI: Abdomen is soft, nontender, nondistended, no abdominal masses GU: No CVA tenderness.  Skin: No rashes, bruises or suspicious lesions. Lymph: No cervical or inguinal adenopathy. Neurologic: Grossly intact, no focal deficits, moving all 4 extremities. Psychiatric: Normal mood and affect.  Laboratory Data: Lab Results  Component Value Date   WBC 7.6 10/07/2015   HGB 13.4 01/25/2014   HCT 40.9 10/07/2015   MCV 87 10/07/2015   PLT 180 10/07/2015     Lab Results  Component Value Date   CREATININE 0.53 (L) 10/25/2015    No results found for: PSA  No results found for: TESTOSTERONE  No results found for: HGBA1C  Urinalysis    Component Value Date/Time   COLORURINE Yellow 01/11/2012 0208   APPEARANCEUR Cloudy (A) 11/29/2015 1019   LABSPEC 1.017 01/11/2012 0208   PHURINE 5.0 01/11/2012 0208   GLUCOSEU Negative 11/29/2015 1019   GLUCOSEU Negative 01/11/2012 0208   HGBUR Negative 01/11/2012 0208   BILIRUBINUR Negative 11/29/2015 1019   BILIRUBINUR Negative 01/11/2012 0208   KETONESUR Negative 01/11/2012 0208   PROTEINUR Negative 11/29/2015 1019   PROTEINUR Negative 01/11/2012 0208   UROBILINOGEN negative 10/08/2015 1009   NITRITE Positive (A) 11/29/2015 1019   NITRITE Positive 01/11/2012 0208   LEUKOCYTESUR 3+ (A) 11/29/2015 1019   LEUKOCYTESUR 3+ 01/11/2012 0208    Pertinent Imaging: CLINICAL DATA:  Gross hematuria. Frequent urinary tract infections. History of neurogenic bladder.  EXAM: CT ABDOMEN AND PELVIS WITHOUT AND WITH CONTRAST  TECHNIQUE: Multidetector CT imaging of the abdomen and pelvis was performed following the standard protocol before and following the bolus administration of intravenous contrast.  CONTRAST:  100 ml Isovue 370.  COMPARISON:  CT 10/09/2009.  FINDINGS: Lower chest: Stable mild dependent atelectasis at both lung bases. There is a moderate size hiatal hernia. No significant pleural or pericardial effusion.  Hepatobiliary: The liver appears stable without suspicious findings. No evidence of gallstones, gallbladder wall thickening or biliary dilatation.  Pancreas: Unremarkable. No pancreatic ductal dilatation or surrounding inflammatory changes.  Spleen: Normal in size without focal abnormality.  Adrenals/Urinary Tract: Both adrenal glands appear normal. Pre-contrast images demonstrate no renal, ureteral or bladder calculi. Post-contrast, both kidneys enhance  normally. There is no evidence of enhancing renal mass. There are sub cm low-density lesions in both kidneys which are too small to optimally characterize, but similar to the prior study and likely all cysts. Delayed images result in segmental visualization of the ureters. No urothelial abnormalities are identified. The bladder is distended with mild wall thickening. There is a 1.3 cm bladder diverticulum on the left. No focal mucosal abnormalities are seen.  Stomach/Bowel: As above, there is a moderate size hiatal hernia. The stomach and small bowel otherwise appear normal. There is large amount of stool throughout the colon, especially within the rectum which is markedly distended. There is some perirectal soft tissue stranding without focal fluid collection or significant wall thickening.  Vascular/Lymphatic: There are no enlarged abdominal or pelvic lymph nodes. Aortic and branch vessel atherosclerosis noted.  Reproductive: The prostate gland and seminal vesicles demonstrate no significant findings.  Other: The anterior abdominal wall appears stable.  No ascites.  Musculoskeletal: No acute or significant osseous findings. There  is posttraumatic deformity of the proximal left femur status post interval dynamic screw and plate ORIF. There are bilateral L5 pars defects with mild biforaminal narrowing at L5-S1.  IMPRESSION: 1. No evidence of urinary tract calculus or hydronephrosis. 2. No evidence of complicated urinary tract infection. The bladder is distended with wall thickening and small diverticula, consistent with neurogenic bladder. 3. No suspicious renal findings. Stable small low density renal lesions bilaterally, likely cysts. 4. Moderate size hiatal hernia. Probable fecal impaction of the rectum with perirectal soft tissue stranding. 5. Aortic atherosclerosis. 6. Bilateral L5 pars defects.    Cystoscopy Procedure Note  Patient identification was  confirmed, informed consent was obtained, and patient was prepped using Betadine solution.  Lidocaine jelly was administered per urethral meatus.    Preoperative abx where received prior to procedure.     Pre-Procedure: - Inspection reveals a normal caliber ureteral meatus.  Procedure: The flexible cystoscope was introduced without difficulty - No urethral strictures/lesions are present. - Normal prostate  - Normal bladder neck - Bilateral ureteral orifices identified - Bladder mucosa  reveals no ulcers, tumors, or lesions - No bladder stones - No trabeculation  Retroflexion shows no intravesical lobe   Post-Procedure: - Patient tolerated the procedure well    Assessment & Plan:    1. Gross hematuria -Negative work up. Follow up in one year for repeat urinalysis   Return for repeat urinalysis.  Nickie Retort, MD  [facility] [address], [place], [place] [address] [phone]

## 2016-01-05 ENCOUNTER — Other Ambulatory Visit: Payer: Self-pay | Admitting: Family Medicine

## 2016-01-07 NOTE — Telephone Encounter (Signed)
Completed.

## 2016-01-07 NOTE — Telephone Encounter (Signed)
COMPLETED

## 2016-01-16 ENCOUNTER — Telehealth: Payer: Self-pay | Admitting: Family Medicine

## 2016-01-16 NOTE — Telephone Encounter (Signed)
[name] @ [facility] advised.

## 2016-01-16 NOTE — Telephone Encounter (Signed)
INR is therapeutic, continue on present dose of Coumadin and recheck in 4 weeks

## 2016-01-23 ENCOUNTER — Other Ambulatory Visit: Payer: Self-pay | Admitting: Family Medicine

## 2016-02-04 ENCOUNTER — Telehealth: Payer: Self-pay | Admitting: Family Medicine

## 2016-02-04 NOTE — Telephone Encounter (Signed)
Pt. Had blood in his urine this AM and since then, he has had no more urine output. Advised to hold off on Coumadin tonight and [name] from [facility] will go to their house tomorrow and obtain INR and a urine specimen. Plan explained to pt.'s mother and Antonietta Breach from [employer].

## 2016-02-05 ENCOUNTER — Emergency Department: Payer: Medicare Other

## 2016-02-05 ENCOUNTER — Encounter: Payer: Self-pay | Admitting: *Deleted

## 2016-02-05 ENCOUNTER — Telehealth: Payer: Self-pay | Admitting: Family Medicine

## 2016-02-05 ENCOUNTER — Inpatient Hospital Stay
Admission: EM | Admit: 2016-02-05 | Discharge: 2016-02-07 | DRG: 871 | Disposition: A | Discharge status: Home Health | Payer: Medicare Other | Attending: Internal Medicine | Admitting: Internal Medicine

## 2016-02-05 DIAGNOSIS — Z79899 Other long term (current) drug therapy: Secondary | ICD-10-CM | POA: Diagnosis not present

## 2016-02-05 DIAGNOSIS — J189 Pneumonia, unspecified organism: Secondary | ICD-10-CM | POA: Diagnosis present

## 2016-02-05 DIAGNOSIS — R079 Chest pain, unspecified: Secondary | ICD-10-CM | POA: Diagnosis not present

## 2016-02-05 DIAGNOSIS — R109 Unspecified abdominal pain: Secondary | ICD-10-CM | POA: Diagnosis not present

## 2016-02-05 DIAGNOSIS — N319 Neuromuscular dysfunction of bladder, unspecified: Secondary | ICD-10-CM | POA: Diagnosis present

## 2016-02-05 DIAGNOSIS — R000 Tachycardia, unspecified: Secondary | ICD-10-CM | POA: Diagnosis not present

## 2016-02-05 DIAGNOSIS — A419 Sepsis, unspecified organism: Principal | ICD-10-CM | POA: Diagnosis present

## 2016-02-05 DIAGNOSIS — R112 Nausea with vomiting, unspecified: Secondary | ICD-10-CM | POA: Diagnosis present

## 2016-02-05 DIAGNOSIS — Z880 Allergy status to penicillin: Secondary | ICD-10-CM

## 2016-02-05 DIAGNOSIS — Z8249 Family history of ischemic heart disease and other diseases of the circulatory system: Secondary | ICD-10-CM

## 2016-02-05 DIAGNOSIS — K56609 Unspecified intestinal obstruction, unspecified as to partial versus complete obstruction: Secondary | ICD-10-CM

## 2016-02-05 DIAGNOSIS — F329 Major depressive disorder, single episode, unspecified: Secondary | ICD-10-CM | POA: Diagnosis present

## 2016-02-05 DIAGNOSIS — K449 Diaphragmatic hernia without obstruction or gangrene: Secondary | ICD-10-CM | POA: Diagnosis not present

## 2016-02-05 DIAGNOSIS — Z87440 Personal history of urinary (tract) infections: Secondary | ICD-10-CM | POA: Diagnosis not present

## 2016-02-05 DIAGNOSIS — R110 Nausea: Secondary | ICD-10-CM | POA: Diagnosis not present

## 2016-02-05 DIAGNOSIS — Z7401 Bed confinement status: Secondary | ICD-10-CM

## 2016-02-05 DIAGNOSIS — Z7901 Long term (current) use of anticoagulants: Secondary | ICD-10-CM | POA: Diagnosis not present

## 2016-02-05 DIAGNOSIS — R1110 Vomiting, unspecified: Secondary | ICD-10-CM | POA: Diagnosis not present

## 2016-02-05 DIAGNOSIS — J181 Lobar pneumonia, unspecified organism: Secondary | ICD-10-CM

## 2016-02-05 DIAGNOSIS — R339 Retention of urine, unspecified: Secondary | ICD-10-CM | POA: Diagnosis not present

## 2016-02-05 DIAGNOSIS — Z833 Family history of diabetes mellitus: Secondary | ICD-10-CM

## 2016-02-05 DIAGNOSIS — G809 Cerebral palsy, unspecified: Secondary | ICD-10-CM | POA: Diagnosis present

## 2016-02-05 DIAGNOSIS — Z86718 Personal history of other venous thrombosis and embolism: Secondary | ICD-10-CM | POA: Diagnosis not present

## 2016-02-05 LAB — URINALYSIS COMPLETE WITH MICROSCOPIC (ARMC ONLY)
BACTERIA UA: NONE SEEN
Bilirubin Urine: NEGATIVE
Glucose, UA: NEGATIVE mg/dL
Hgb urine dipstick: NEGATIVE
Nitrite: NEGATIVE
PH: 7 (ref 5.0–8.0)
PROTEIN: NEGATIVE mg/dL
Specific Gravity, Urine: 1.003 — ABNORMAL LOW (ref 1.005–1.030)

## 2016-02-05 LAB — COMPREHENSIVE METABOLIC PANEL
ALBUMIN: 4 g/dL (ref 3.5–5.0)
ALT: 33 U/L (ref 17–63)
AST: 26 U/L (ref 15–41)
Alkaline Phosphatase: 74 U/L (ref 38–126)
Anion gap: 7 (ref 5–15)
BUN: 25 mg/dL — AB (ref 6–20)
CHLORIDE: 104 mmol/L (ref 101–111)
CO2: 24 mmol/L (ref 22–32)
CREATININE: 0.54 mg/dL — AB (ref 0.61–1.24)
Calcium: 8.9 mg/dL (ref 8.9–10.3)
GFR calc Af Amer: >60 mL/min (ref >60)
GFR calc non Af Amer: >60 mL/min (ref >60)
GLUCOSE: 121 mg/dL — AB (ref 65–99)
POTASSIUM: 3.9 mmol/L (ref 3.5–5.1)
Sodium: 135 mmol/L (ref 135–145)
Total Bilirubin: 0.8 mg/dL (ref 0.3–1.2)
Total Protein: 6.8 g/dL (ref 6.5–8.1)

## 2016-02-05 LAB — CBC
HEMATOCRIT: 39 % — AB (ref 40.0–52.0)
Hemoglobin: 13.9 g/dL (ref 13.0–18.0)
MCH: 30.5 pg (ref 26.0–34.0)
MCHC: 35.7 g/dL (ref 32.0–36.0)
MCV: 85.6 fL (ref 80.0–100.0)
Platelets: 158 10*3/uL (ref 150–440)
RBC: 4.56 MIL/uL (ref 4.40–5.90)
RDW: 14 % (ref 11.5–14.5)
WBC: 9.7 10*3/uL (ref 3.8–10.6)

## 2016-02-05 LAB — LIPASE, BLOOD: Lipase: 24 U/L (ref 11–51)

## 2016-02-05 LAB — TROPONIN I: Troponin I: <0.03 ng/mL (ref <0.03)

## 2016-02-05 LAB — PROTIME-INR
INR: 2.73
PROTHROMBIN TIME: 29.5 s — AB (ref 11.4–15.2)

## 2016-02-05 LAB — GLUCOSE, CAPILLARY: Glucose-Capillary: 108 mg/dL — ABNORMAL HIGH (ref 65–99)

## 2016-02-05 LAB — LACTIC ACID, PLASMA: LACTIC ACID, VENOUS: 1.1 mmol/L (ref 0.5–1.9)

## 2016-02-05 MED ORDER — WARFARIN - PHYSICIAN DOSING INPATIENT: Freq: Every day | Status: DC

## 2016-02-05 MED ORDER — SODIUM CHLORIDE 0.9 % IV BOLUS (SEPSIS)
1000.0000 mL | Freq: Once | INTRAVENOUS | Status: AC
  Administered 2016-02-05: 1000 mL via INTRAVENOUS

## 2016-02-05 MED ORDER — SODIUM CHLORIDE 0.9 % IV BOLUS (SEPSIS)
500.0000 mL | Freq: Once | INTRAVENOUS | Status: AC
  Administered 2016-02-05: 500 mL via INTRAVENOUS

## 2016-02-05 MED ORDER — ONDANSETRON HCL 4 MG PO TABS
4.0000 mg | ORAL_TABLET | Freq: Four times a day (QID) | ORAL | Status: DC | PRN
  Administered 2016-02-06 – 2016-02-07 (×2): 4 mg via ORAL
  Filled 2016-02-05 (×2): qty 1

## 2016-02-05 MED ORDER — SODIUM CHLORIDE 0.9% FLUSH
3.0000 mL | Freq: Two times a day (BID) | INTRAVENOUS | Status: DC
  Administered 2016-02-05 – 2016-02-06 (×3): 3 mL via INTRAVENOUS

## 2016-02-05 MED ORDER — DOCUSATE SODIUM 100 MG PO CAPS
100.0000 mg | ORAL_CAPSULE | Freq: Two times a day (BID) | ORAL | Status: DC
  Administered 2016-02-05 – 2016-02-07 (×3): 100 mg via ORAL
  Filled 2016-02-05 (×4): qty 1

## 2016-02-05 MED ORDER — POLYETHYLENE GLYCOL 3350 17 G PO PACK: 17.0000 g | PACK | Freq: Every day | ORAL | Status: DC | PRN

## 2016-02-05 MED ORDER — ARIPIPRAZOLE 5 MG PO TABS
15.0000 mg | ORAL_TABLET | Freq: Every day | ORAL | Status: DC
  Administered 2016-02-05 – 2016-02-07 (×2): 15 mg via ORAL
  Filled 2016-02-05 (×4): qty 3

## 2016-02-05 MED ORDER — BISACODYL 10 MG RE SUPP: 10.0000 mg | Freq: Every day | RECTAL | Status: DC | PRN

## 2016-02-05 MED ORDER — SODIUM CHLORIDE 0.9 % IV SOLN
INTRAVENOUS | Status: DC
  Administered 2016-02-06 (×2): via INTRAVENOUS

## 2016-02-05 MED ORDER — ALUM & MAG HYDROXIDE-SIMETH 200-200-20 MG/5ML PO SUSP
30.0000 mL | Freq: Four times a day (QID) | ORAL | Status: DC | PRN
  Administered 2016-02-05: 30 mL via ORAL

## 2016-02-05 MED ORDER — ALBUTEROL SULFATE (2.5 MG/3ML) 0.083% IN NEBU: 2.5000 mg | INHALATION_SOLUTION | RESPIRATORY_TRACT | Status: DC | PRN

## 2016-02-05 MED ORDER — SODIUM CHLORIDE 0.9% FLUSH: 3.0000 mL | INTRAVENOUS | Status: DC | PRN

## 2016-02-05 MED ORDER — ACETAMINOPHEN 325 MG PO TABS: 650.0000 mg | ORAL_TABLET | Freq: Four times a day (QID) | ORAL | Status: DC | PRN

## 2016-02-05 MED ORDER — LEVOFLOXACIN IN D5W 750 MG/150ML IV SOLN
750.0000 mg | Freq: Once | INTRAVENOUS | Status: AC
  Administered 2016-02-05: 750 mg via INTRAVENOUS
  Filled 2016-02-05: qty 150

## 2016-02-05 MED ORDER — DEXTROSE 5 % IV SOLN
2.0000 g | Freq: Once | INTRAVENOUS | Status: AC
  Administered 2016-02-05: 2 g via INTRAVENOUS
  Filled 2016-02-05: qty 2

## 2016-02-05 MED ORDER — OXYCODONE HCL 5 MG PO TABS
5.0000 mg | ORAL_TABLET | ORAL | Status: DC | PRN
  Administered 2016-02-06 – 2016-02-07 (×2): 5 mg via ORAL
  Filled 2016-02-05 (×2): qty 1

## 2016-02-05 MED ORDER — LEVOFLOXACIN IN D5W 750 MG/150ML IV SOLN: 750.0000 mg | INTRAVENOUS | Status: DC

## 2016-02-05 MED ORDER — LEVOFLOXACIN IN D5W 750 MG/150ML IV SOLN
750.0000 mg | INTRAVENOUS | Status: DC
  Administered 2016-02-06: 750 mg via INTRAVENOUS
  Filled 2016-02-05 (×2): qty 150

## 2016-02-05 MED ORDER — WARFARIN SODIUM 5 MG PO TABS
5.0000 mg | ORAL_TABLET | Freq: Every day | ORAL | Status: DC
  Administered 2016-02-05: 5 mg via ORAL

## 2016-02-05 MED ORDER — BETHANECHOL CHLORIDE 25 MG PO TABS
50.0000 mg | ORAL_TABLET | Freq: Three times a day (TID) | ORAL | Status: DC
  Administered 2016-02-05 – 2016-02-07 (×3): 50 mg via ORAL
  Filled 2016-02-05 (×5): qty 2

## 2016-02-05 MED ORDER — ONDANSETRON HCL 4 MG/2ML IJ SOLN
4.0000 mg | Freq: Four times a day (QID) | INTRAMUSCULAR | Status: DC | PRN
  Administered 2016-02-06 (×2): 4 mg via INTRAVENOUS
  Filled 2016-02-05 (×2): qty 2

## 2016-02-05 MED ORDER — SODIUM CHLORIDE 0.9 % IV SOLN: 250.0000 mL | INTRAVENOUS | Status: DC | PRN

## 2016-02-05 MED ORDER — SODIUM CHLORIDE 0.9 % IV BOLUS (SEPSIS)
250.0000 mL | Freq: Once | INTRAVENOUS | Status: AC
  Administered 2016-02-05: 250 mL via INTRAVENOUS

## 2016-02-05 MED ORDER — ACETAMINOPHEN 650 MG RE SUPP: 650.0000 mg | Freq: Four times a day (QID) | RECTAL | Status: DC | PRN

## 2016-02-05 MED ORDER — ENOXAPARIN SODIUM 40 MG/0.4ML ~~LOC~~ SOLN
40.0000 mg | SUBCUTANEOUS | Status: DC
Start: 2016-02-05 — End: 2016-02-05

## 2016-02-05 MED ORDER — ALUM & MAG HYDROXIDE-SIMETH 200-200-20 MG/5ML PO SUSP
ORAL | Status: AC
  Administered 2016-02-05: 30 mL via ORAL
  Filled 2016-02-05: qty 30

## 2016-02-05 NOTE — ED Notes (Signed)
Family Member to contact with questions regarding patient: [phone]

## 2016-02-05 NOTE — H&P (Signed)
[facility] NAME: Gerald Montgomery    MR#:  [id number]  DATE OF BIRTH:  Mar 12, 1956  DATE OF ADMISSION:  02/05/2016  PRIMARY CARE PHYSICIAN: Keith Rake, MD   REQUESTING/REFERRING PHYSICIAN: Dr. Karma Greaser  CHIEF COMPLAINT:   Chief Complaint  Patient presents with  . Chest Pain    HISTORY OF PRESENT ILLNESS:  Gerald Montgomery  is a 60 y.o. male with a known history of Cerebral palsy, DVT on Coumadin and history of recurrent UTIs presents to the hospital due to vomiting and not feeling well. Patient is a poor historian, he has on and off chest pain and abdominal pain at this vomiting as per family. He did complain of this earlier but presently states he is not in pain. Initial concern was UTI for which the family brought him in a patient does not have urinary tract infection on urinalysis. Chest x-ray shows left lower lobe pneumonia with tachycardia into the 120s and is being admitted to the hospitalist service.  PAST MEDICAL HISTORY:   Past Medical History:  Diagnosis Date  . Depression   . History of DVT of lower extremity   . Mood disorder ([place])   . Neurogenic bladder   . Urinary incontinence     PAST SURGICAL HISTORY:   Past Surgical History:  Procedure Laterality Date  . filter surgery for blood clot  2013  . HIP FRACTURE SURGERY  2013    SOCIAL HISTORY:   Social History  Substance Use Topics  . Smoking status: Never Smoker  . Smokeless tobacco: Never Used  . Alcohol use No    FAMILY HISTORY:   Family History  Problem Relation Age of Onset  . Heart disease Mother   . Atrial fibrillation Mother   . Diabetes Mother     Borderline  . Heart disease Father     Passed away of heart attack  . Kidney disease Neg Hx   . Prostate cancer Neg Hx     DRUG ALLERGIES:   Allergies  Allergen Reactions  . Penicillins     Sickness/sweating  . Sulfa Antibiotics Itching    REVIEW OF SYSTEMS:   Review of Systems   Unable to perform ROS: Medical condition    MEDICATIONS AT HOME:   Prior to Admission medications   Medication Sig Start Date End Date Taking? Authorizing Provider  ARIPiprazole (ABILIFY) 15 MG tablet TAKE 1 TABLET(15 MG) BY MOUTH DAILY 01/23/16  Yes Roselee Nova, MD  bethanechol (URECHOLINE) 50 MG tablet TAKE 1 TABLET(50 MG) BY MOUTH THREE TIMES DAILY 01/06/16  Yes Roselee Nova, MD  warfarin (COUMADIN) 5 MG tablet TAKE 1 TABLET(5 MG) BY MOUTH DAILY 09/02/15  Yes Roselee Nova, MD     VITAL SIGNS:  Blood pressure 113/80, pulse (!) 121, temperature 97.5 F (36.4 C), temperature source Oral, resp. rate 16, weight 56.7 kg (125 lb), SpO2 98 %.  PHYSICAL EXAMINATION:  Physical Exam  GENERAL:  60 y.o.-year-old patient lying in the bed with no acute distress.  EYES: Pupils equal, round, reactive to light and accommodation. No scleral icterus. Extraocular muscles intact.  HEENT: Head atraumatic, normocephalic. Oropharynx and nasopharynx clear. No oropharyngeal erythema, moist oral mucosa  NECK:  Supple, no jugular venous distention. No thyroid enlargement, no tenderness.  LUNGS: Normal breath sounds bilaterally, no wheezing, rales, rhonchi. No use of accessory muscles of respiration.  CARDIOVASCULAR: S1, S2 normal. No murmurs, rubs, or gallops.  ABDOMEN: Soft, nontender, nondistended. Bowel sounds present. No organomegaly or mass.  EXTREMITIES: No pedal edema, cyanosis, or clubbing. + 2 pedal & radial pulses b/l.   NEUROLOGIC: Contractures and muscle wasting PSYCHIATRIC: The patient is alert and awake.Marland Kitchen   LABORATORY PANEL:   CBC  Recent Labs Lab 02/05/16 1450  WBC 9.7  HGB 13.9  HCT 39.0*  PLT 158   ------------------------------------------------------------------------------------------------------------------  Chemistries   Recent Labs Lab 02/05/16 1450  NA 135  K 3.9  CL 104  CO2 24  GLUCOSE 121*  BUN 25*  CREATININE 0.54*  CALCIUM 8.9  AST 26  ALT 33   ALKPHOS 74  BILITOT 0.8   ------------------------------------------------------------------------------------------------------------------  Cardiac Enzymes  Recent Labs Lab 02/05/16 1450  TROPONINI <0.03   ------------------------------------------------------------------------------------------------------------------  RADIOLOGY:  Dg Chest 2 View  Result Date: 02/05/2016 CLINICAL DATA:  Difficulty with urination.  Chest pain. EXAM: CHEST  2 VIEW COMPARISON:  01/25/2014 FINDINGS: The heart size and mediastinal contours are within normal limits. Moderate size hiatal hernia identified. Airspace opacity within the left posterior lung base is identified. IMPRESSION: 1. Left lower lobe pneumonia suspected. Electronically Signed   By: Kerby Moors M.D.   On: 02/05/2016 15:19     IMPRESSION AND PLAN:   * Left lower lobe pneumonia Start IV Levaquin. Send sputum cultures. IV fluids due to tachycardia. Lactic acid has returned normal Oxygen if needed. Nebulizers as needed.  * Vomiting Has improved. Zofran as needed  * History of DVT on Coumadin Pharmacy to dose Coumadin  All the records are reviewed and case discussed with ED provider. Management plans discussed with the patient, family and they are in agreement.  CODE STATUS: FULL CODE  TOTAL TIME TAKING CARE OF THIS PATIENT: 40 minutes.   Hillary Bow R M.D on 02/05/2016 at 6:31 PM  Between 7am to 6pm - Pager - [phone]  After 6pm go to [email or web address] - password [email or web address] [facility]  Office  [phone]  CC: Primary care physician; Keith Rake, MD  Note: This dictation was prepared with Dragon dictation along with smaller phrase technology. Any transcriptional errors that result from this process are unintentional.

## 2016-02-05 NOTE — Progress Notes (Signed)
[facility]  Patient Status: Active  [facility] is providing the following services: SN  If patient discharges after hours, please call [phone].   Gerald Montgomery 02/05/2016, 3:51 PM

## 2016-02-05 NOTE — ED Triage Notes (Signed)
Pt arrived to ED via EMS from home after pt has reportedly been unable to urinate since yesterday morning. Home health RN attempted to cath pt at home and family reports only "puss" came out of pts bladder. Pt also verbalized chest pain but is unable to elaborate on location or type. Pts family verbalized that pt usually verbalizes chest pain after vomiting and pt did vomit and gag throughout the day yesterday. Pt had hx of kidney infections. Hx of Cerebral Palsy, blood clots, and depression. Pt alert upon arrival.

## 2016-02-05 NOTE — Progress Notes (Signed)
Pharmacy Antibiotic Note  Gerald Montgomery is a 60 y.o. male admitted on 02/05/2016 with pneumonia.  Pharmacy has been consulted for Levofloxacin dosing.  Plan: Will continue the patient on Levofloxacin 750mg  daily.  Will continue monitor renal function and adjust dose as needed.   Weight: 125 lb (56.7 kg)  Temp (24hrs), Avg:97.5 F (36.4 C), Min:97.5 F (36.4 C), Max:97.5 F (36.4 C)   Recent Labs Lab 02/05/16 1450  WBC 9.7  CREATININE 0.54*  LATICACIDVEN 1.1    Estimated Creatinine Clearance: 79.7 mL/min (by C-G formula based on SCr of 0.54 mg/dL (L)).    Allergies  Allergen Reactions  . Penicillins     Sickness/sweating  . Sulfa Antibiotics Itching    Antimicrobials this admission: 9/13 levofloxacin >>    Microbiology results: 9/13 BCx: pending 9/13 UCx: pending 9/13 Sputum: pending    Thank you for allowing pharmacy to be a part of this patient's care.  Nancy Fetter, PharmD Clinical Pharmacist 02/05/2016 7:25 PM

## 2016-02-05 NOTE — Progress Notes (Signed)
ANTICOAGULATION CONSULT NOTE - Initial Consult  Pharmacy Consult for warfarin Indication: DVT  Allergies  Allergen Reactions  . Penicillins     Sickness/sweating  . Sulfa Antibiotics Itching    Patient Measurements: Weight: 125 lb (56.7 kg)  Vital Signs: Temp: 97.5 F (36.4 C) (09/13 1424) Temp Source: Oral (09/13 1424) BP: 113/80 (09/13 1812) Pulse Rate: 121 (09/13 1812)  Labs:  Recent Labs  02/05/16 1450  HGB 13.9  HCT 39.0*  PLT 158  LABPROT 29.5*  INR 2.73  CREATININE 0.54*  TROPONINI <0.03    Estimated Creatinine Clearance: 79.7 mL/min (by C-G formula based on SCr of 0.54 mg/dL (L)).   Medical History: Past Medical History:  Diagnosis Date  . Depression   . History of DVT of lower extremity   . Mood disorder ([place])   . Neurogenic bladder   . Urinary incontinence     Assessment: 60 yo male with hx of DVT admitted for treatment of pneumonia. Pharmacy consulted for dosing and monitoring of warfarin. Patient takes Warfarin 5mg  daily at home. Patient is receiving antibiotics (levofloxacin).  9/13INR: 2.73  Goal of Therapy:  INR 2-3 Monitor platelets by anticoagulation protocol: Yes   Plan:  Enoxaparin ordered- will D/Cd due to therapeutic INR.  Will continue patients home dose of warfarin 5mg  daily.  Will monitor INR daily while on antibiotics.    Nancy Fetter, PharmD Clinical Pharmacist 02/05/2016 7:22 PM

## 2016-02-05 NOTE — ED Notes (Signed)
Patient transported to X-ray 

## 2016-02-05 NOTE — ED Provider Notes (Addendum)
[facility] Emergency Department Provider Note  ____________________________________________   First MD Initiated Contact with Patient 02/05/16 1443     (approximate)  I have reviewed the triage vital signs and the nursing notes.   HISTORY  Chief Complaint Chest Pain  The patient's history is provided by family Limited by his cerebral palsy  HPI Gerald Montgomery is a 60 y.o. male with history of neurogenic bladder and multiple urinary tract infections in the past who presents with several days of "upset stomach", vomiting, and some discomfort in his lower abdomen that is consistent with prior urinary tract infections.Reportedly he normally urinates into a jug by himself but has been unable to do so so the home health nurse came out to catheterize him.  When she did so she said that the only thing that came out was "pus".  He was thus brought to the emergency department for further evaluation.  He shakes his head no when asked about fever/chills and difficult breathing.  He reports some pain in the middle of his lower part of his chest but his family reports that he always complains of that when he has been vomiting.  Overall his symptoms are moderate, they seem to be getting worse over time, and nothing is making them better.  He is on warfarin for anticoagulation.   Past Medical History:  Diagnosis Date  . Depression   . History of DVT of lower extremity   . Mood disorder ([place])   . Neurogenic bladder   . Urinary incontinence     Patient Active Problem List   Diagnosis Date Noted  . Cerebral palsy ([place]) 12/05/2015  . Hematuria, gross 10/07/2015  . History of DVT of lower extremity 04/11/2015  . Mood disorder ([place]) 04/11/2015  . Neurogenic bladder 04/11/2015  . Anticoagulation goal of INR 2 to 3 04/11/2015    Past Surgical History:  Procedure Laterality Date  . filter surgery for blood clot  2013  . HIP FRACTURE SURGERY  2013    Prior to  Admission medications   Medication Sig Start Date End Date Taking? Authorizing Provider  ARIPiprazole (ABILIFY) 15 MG tablet TAKE 1 TABLET(15 MG) BY MOUTH DAILY 01/23/16   Roselee Nova, MD  bethanechol (URECHOLINE) 50 MG tablet TAKE 1 TABLET(50 MG) BY MOUTH THREE TIMES DAILY 01/06/16   Roselee Nova, MD  warfarin (COUMADIN) 3 MG tablet Take 1 tablet (3 mg total) by mouth daily at 6 PM. 10/29/15   Roselee Nova, MD  warfarin (COUMADIN) 5 MG tablet TAKE 1 TABLET(5 MG) BY MOUTH DAILY 09/02/15   Roselee Nova, MD    Allergies Penicillins and Sulfa antibiotics  Family History  Problem Relation Age of Onset  . Heart disease Mother   . Atrial fibrillation Mother   . Diabetes Mother     Borderline  . Heart disease Father     Passed away of heart attack  . Kidney disease Neg Hx   . Prostate cancer Neg Hx     Social History Social History  Substance Use Topics  . Smoking status: Never Smoker  . Smokeless tobacco: Never Used  . Alcohol use No    Review of Systems Level V caveat - unable to obtain due to patient's minimal ability to respond at baseline ____________________________________________   PHYSICAL EXAM:  VITAL SIGNS: ED Triage Vitals  Enc Vitals Group     BP 02/05/16 1424 (!) 144/96     Pulse  Rate 02/05/16 1424 (!) 118     Resp 02/05/16 1424 (!) 28     Temp 02/05/16 1424 97.5 F (36.4 C)     Temp Source 02/05/16 1424 Oral     SpO2 02/05/16 1424 94 %     Weight 02/05/16 1425 125 lb (56.7 kg)     Height --      Head Circumference --      Peak Flow --      Pain Score --      Pain Loc --      Pain Edu? --      Excl. in [place]? --     Constitutional: Alert.  No acute distress.  Appears chronically ill. Eyes: Conjunctivae are normal. PERRL. EOMI. Head: Atraumatic. Nose: No congestion/rhinnorhea. Mouth/Throat: Mucous membranes are dry.  Very poor dentition. Neck: No stridor.  No meningeal signs.   Cardiovascular: Tachycardia, regular rhythm. Good peripheral  circulation. Grossly normal heart sounds. Respiratory: Normal respiratory effort.  No retractions. Lungs CTAB. Gastrointestinal: Soft and nontender. No distention.  Musculoskeletal: No lower extremity tenderness nor edema. No gross deformities of extremities. Neurologic:  Normal speech and language. No gross focal neurologic deficits are appreciated.  Skin:  Skin is warm, dry and intact. No rash noted.   ____________________________________________   LABS (all labs ordered are listed, but only abnormal results are displayed)  Labs Reviewed  COMPREHENSIVE METABOLIC PANEL - Abnormal; Notable for the following:       Result Value   Glucose, Bld 121 (*)    BUN 25 (*)    Creatinine, Ser 0.54 (*)    All other components within normal limits  URINALYSIS COMPLETEWITH MICROSCOPIC ([facility] ONLY) - Abnormal; Notable for the following:    Color, Urine YELLOW (*)    APPearance CLEAR (*)    Ketones, ur TRACE (*)    Specific Gravity, Urine 1.003 (*)    Leukocytes, UA 2+ (*)    Squamous Epithelial / LPF 0-5 (*)    All other components within normal limits  PROTIME-INR - Abnormal; Notable for the following:    Prothrombin Time 29.5 (*)    All other components within normal limits  CBC - Abnormal; Notable for the following:    HCT 39.0 (*)    All other components within normal limits  URINE CULTURE  CULTURE, BLOOD (ROUTINE X 2)  CULTURE, BLOOD (ROUTINE X 2)  LIPASE, BLOOD  LACTIC ACID, PLASMA  TROPONIN I   ____________________________________________  EKG  ED ECG REPORT I, Cherylee Rawlinson, the attending physician, personally viewed and interpreted this ECG.  Date: 02/05/2016 EKG Time: 14:19 Rate: 127 Rhythm: Sinus tachycardia QRS Axis: normal Intervals: normal ST/T Wave abnormalities: Inverted T waves in leads V2, V3, and V4, with approximately 1 mm of ST depression in the same leads. Conduction Disturbances: none Narrative Interpretation:  unremarkable  ____________________________________________  RADIOLOGY   Dg Chest 2 View  Result Date: 02/05/2016 CLINICAL DATA:  Difficulty with urination.  Chest pain. EXAM: CHEST  2 VIEW COMPARISON:  01/25/2014 FINDINGS: The heart size and mediastinal contours are within normal limits. Moderate size hiatal hernia identified. Airspace opacity within the left posterior lung base is identified. IMPRESSION: 1. Left lower lobe pneumonia suspected. Electronically Signed   By: Kerby Moors M.D.   On: 02/05/2016 15:19    ____________________________________________   PROCEDURES  Procedure(s) performed:   .Critical Care Performed by: Hinda Kehr Authorized by: Hinda Kehr   Critical care provider statement:    Critical care time (minutes):  45   Critical care time was exclusive of:  Separately billable procedures and treating other patients   Critical care was necessary to treat or prevent imminent or life-threatening deterioration of the following conditions:  Sepsis   Critical care was time spent personally by me on the following activities:  Development of treatment plan with patient or surrogate, discussions with consultants, evaluation of patient's response to treatment, examination of patient, obtaining history from patient or surrogate, ordering and performing treatments and interventions, ordering and review of laboratory studies, ordering and review of radiographic studies, pulse oximetry, re-evaluation of patient's condition and review of old charts       Critical Care performed: Yes, see critical care procedure note(s) ____________________________________________   INITIAL IMPRESSION / [place] / ED COURSE  Pertinent labs & imaging results that were available during my care of the patient were reviewed by me and considered in my medical decision making (see chart for details).  The patient meets sepsis criteria given the report of grossly purulent  material upon attempt at catheterization, I activated could sepsis including empiric antibiotics for penicillin allergy and 30 mL/kg of fluids.  His EKG is somewhat concerning and brings up the possibility of demand ischemia secondary to sepsis versus ACS.  He is hemodynamically stable at this time.   Clinical Course  Comment By Time  The patient is hemodynamically stable.  He is receiving his empiric antibiotics.  Urinalysis is pending.  Lactic acid is also pending.  INR within normal limits.  No leukocytosis. Hinda Kehr, MD 09/13 1619  The patient's urine is unremarkable, but he does have what appears to be a left lower lobe pneumonia.  After 1.5 L of fluid he is still tachycardic at 115.  His blood pressure is stable.  Though his lab workup is generally reassuring he still has unstable vital signs and a source of infection, meets sepsis criteria, and has comorbidities that put him at high risk of worsening before he gets better.  I discussed the case with Dr. Posey Pronto the hospitalist who will admit. Hinda Kehr, MD 09/13 1740    ____________________________________________  FINAL CLINICAL IMPRESSION(S) / ED DIAGNOSES  Final diagnoses:  Left lower lobe pneumonia  Sepsis, due to unspecified organism [facility])     MEDICATIONS GIVEN DURING THIS VISIT:  Medications  levofloxacin (LEVAQUIN) IVPB 750 mg (750 mg Intravenous New Bag/Given 02/05/16 1620)  aztreonam (AZACTAM) 2 g in dextrose 5 % 50 mL IVPB (not administered)  sodium chloride 0.9 % bolus 1,000 mL (0 mLs Intravenous Stopped 02/05/16 1645)    And  sodium chloride 0.9 % bolus 500 mL (500 mLs Intravenous New Bag/Given 02/05/16 1645)    And  sodium chloride 0.9 % bolus 250 mL (not administered)     NEW OUTPATIENT MEDICATIONS STARTED DURING THIS VISIT:  New Prescriptions   No medications on file    Modified Medications   No medications on file    Discontinued Medications   No medications on file     Note:  This document was  prepared using Dragon voice recognition software and may include unintentional dictation errors.    Hinda Kehr, MD 02/05/16 [id number]    Hinda Kehr, MD 02/05/16 [phone]

## 2016-02-05 NOTE — ED Notes (Signed)
Delay in antibiotics is due to two attemptes at in and out catheter and still no success on obtaining urine. Pt unable to urinate on command and urine bag has been  Been applied in attempt to catch urine sample.

## 2016-02-06 ENCOUNTER — Inpatient Hospital Stay: Payer: Medicare Other

## 2016-02-06 ENCOUNTER — Encounter: Payer: Self-pay | Admitting: Radiology

## 2016-02-06 LAB — PROTIME-INR
INR: 2.93
Prothrombin Time: 31.2 seconds — ABNORMAL HIGH (ref 11.4–15.2)

## 2016-02-06 LAB — GLUCOSE, CAPILLARY
GLUCOSE-CAPILLARY: 83 mg/dL (ref 65–99)
Glucose-Capillary: 94 mg/dL (ref 65–99)
Glucose-Capillary: 89 mg/dL (ref 65–99)

## 2016-02-06 LAB — CBC
HCT: 32 % — ABNORMAL LOW (ref 40.0–52.0)
Hemoglobin: 11.3 g/dL — ABNORMAL LOW (ref 13.0–18.0)
MCH: 30.5 pg (ref 26.0–34.0)
MCHC: 35.3 g/dL (ref 32.0–36.0)
MCV: 86.5 fL (ref 80.0–100.0)
Platelets: 109 10*3/uL — ABNORMAL LOW (ref 150–440)
RBC: 3.7 MIL/uL — ABNORMAL LOW (ref 4.40–5.90)
RDW: 13.7 % (ref 11.5–14.5)
WBC: 5.9 10*3/uL (ref 3.8–10.6)

## 2016-02-06 LAB — BASIC METABOLIC PANEL
ANION GAP: 2 — AB (ref 5–15)
BUN: 15 mg/dL (ref 6–20)
CHLORIDE: 114 mmol/L — AB (ref 101–111)
CO2: 23 mmol/L (ref 22–32)
CREATININE: 0.48 mg/dL — AB (ref 0.61–1.24)
Calcium: 7.7 mg/dL — ABNORMAL LOW (ref 8.9–10.3)
GFR calc non Af Amer: >60 mL/min (ref >60)
Glucose, Bld: 102 mg/dL — ABNORMAL HIGH (ref 65–99)
POTASSIUM: 3.8 mmol/L (ref 3.5–5.1)
Sodium: 139 mmol/L (ref 135–145)

## 2016-02-06 MED ORDER — POLYETHYLENE GLYCOL 3350 17 G PO PACK
17.0000 g | PACK | Freq: Every day | ORAL | Status: DC
  Administered 2016-02-06 – 2016-02-07 (×2): 17 g via ORAL
  Filled 2016-02-06 (×3): qty 1

## 2016-02-06 MED ORDER — WARFARIN - PHARMACIST DOSING INPATIENT
Freq: Every day | Status: DC
  Administered 2016-02-06: 18:00:00

## 2016-02-06 MED ORDER — WARFARIN SODIUM 2.5 MG PO TABS
4.5000 mg | ORAL_TABLET | Freq: Every day | ORAL | Status: DC
  Administered 2016-02-06: 4.5 mg via ORAL
  Filled 2016-02-06 (×2): qty 1

## 2016-02-06 MED ORDER — IOPAMIDOL (ISOVUE-300) INJECTION 61%
15.0000 mL | INTRAVENOUS | Status: AC
  Administered 2016-02-06 (×2): 15 mL via ORAL

## 2016-02-06 MED ORDER — IOPAMIDOL (ISOVUE-300) INJECTION 61%
100.0000 mL | Freq: Once | INTRAVENOUS | Status: AC | PRN
  Administered 2016-02-06: 100 mL via INTRAVENOUS

## 2016-02-06 NOTE — Telephone Encounter (Signed)
Patient is currently admitted to the hospital, he is being treated for pneumonia. please schedule for a hospital follow-up appointment after discharge.

## 2016-02-06 NOTE — Progress Notes (Signed)
Patient is A&O x3, HX: CP, total care patient. C/O nausea most of the day, gave Zofran x1. Was able to tolerate CT contrast without issues. Waiting for surgery consult and results of CT, until then pt NPO. Incont of urine. Decreased IV fluids. Tele in place.

## 2016-02-06 NOTE — Progress Notes (Signed)
ANTICOAGULATION CONSULT NOTE - follow up [place] for warfarin Indication: DVT  Allergies  Allergen Reactions  . Penicillins     Sickness/sweating  . Sulfa Antibiotics Itching    Patient Measurements: Weight: 124 lb 8 oz (56.5 kg)  Vital Signs: Temp: 97.7 F (36.5 C) (09/14 0409) Temp Source: Oral (09/14 0409) BP: 121/69 (09/14 0409) Pulse Rate: 77 (09/14 0409)  Labs:  Recent Labs  02/05/16 1450 02/06/16 0458  HGB 13.9 11.3*  HCT 39.0* 32.0*  PLT 158 109*  LABPROT 29.5* 31.2*  INR 2.73 2.93  CREATININE 0.54* 0.48*  TROPONINI <0.03  --     Estimated Creatinine Clearance: 79.5 mL/min (by C-G formula based on SCr of 0.48 mg/dL (L)).   Medical History: Past Medical History:  Diagnosis Date  . Depression   . History of DVT of lower extremity   . Mood disorder ([place])   . Neurogenic bladder   . Urinary incontinence     Assessment: 60 yo male with hx of DVT admitted for treatment of pneumonia. Pharmacy consulted for dosing and monitoring of warfarin. Patient takes Warfarin 5mg  daily at home. Patient is receiving antibiotics (levofloxacin).  9/13 INR: 2.73  Warfarin 5 mg 9/14 INR: 2.93   Goal of Therapy:  INR 2-3 Monitor platelets by anticoagulation protocol: Yes   Plan:  Will slightly decrease Warfarin to 4.5 mg daily (possible effect on INR from ABX) . Will monitor INR daily while on antibiotics. CBC at least weekly.   Chinita Greenland PharmD Clinical Pharmacist 02/06/2016  8:28 AM

## 2016-02-06 NOTE — Progress Notes (Signed)
[place] [facility] [place] NAME: Gerald Montgomery    MR#:  [id number]  DATE OF BIRTH:  Apr 05, 1956  SUBJECTIVE:   Patient is reporting that he is nauseous. He has also complaining of generalized abdominal pain. He reports he has had abdominal pain and nausea for several months now. He is unable to eat.  REVIEW OF SYSTEMS:    Review of Systems  Constitutional: Negative.  Negative for chills, fever and malaise/fatigue.  HENT: Negative.  Negative for ear discharge, ear pain, hearing loss, nosebleeds and sore throat.   Eyes: Negative.  Negative for blurred vision and pain.  Respiratory: Negative.  Negative for cough, hemoptysis, shortness of breath and wheezing.   Cardiovascular: Negative.  Negative for chest pain, palpitations and leg swelling.  Gastrointestinal: Positive for abdominal pain, nausea and vomiting. Negative for blood in stool and diarrhea.  Genitourinary: Negative.  Negative for dysuria.  Musculoskeletal: Negative.  Negative for back pain.  Skin: Negative.   Neurological: Negative for dizziness, tremors, speech change, focal weakness, seizures and headaches.       Cerebral palsy bedbound  Endo/Heme/Allergies: Negative.  Does not bruise/bleed easily.  Psychiatric/Behavioral: Negative.  Negative for depression, hallucinations and suicidal ideas.    Tolerating Diet:No      DRUG ALLERGIES:   Allergies  Allergen Reactions  . Penicillins     Sickness/sweating  . Sulfa Antibiotics Itching    VITALS:  Blood pressure 121/69, pulse 77, temperature 97.7 F (36.5 C), temperature source Oral, resp. rate 18, weight 56.5 kg (124 lb 8 oz), SpO2 94 %.  PHYSICAL EXAMINATION:   Physical Exam    LABORATORY PANEL:   CBC  Recent Labs Lab 02/06/16 0458  WBC 5.9  HGB 11.3*  HCT 32.0*  PLT 109*   ------------------------------------------------------------------------------------------------------------------  Chemistries   Recent  Labs Lab 02/05/16 1450 02/06/16 0458  NA 135 139  K 3.9 3.8  CL 104 114*  CO2 24 23  GLUCOSE 121* 102*  BUN 25* 15  CREATININE 0.54* 0.48*  CALCIUM 8.9 7.7*  AST 26  --   ALT 33  --   ALKPHOS 74  --   BILITOT 0.8  --    ------------------------------------------------------------------------------------------------------------------  Cardiac Enzymes  Recent Labs Lab 02/05/16 1450  TROPONINI <0.03   ------------------------------------------------------------------------------------------------------------------  RADIOLOGY:  Dg Chest 2 View  Result Date: 02/05/2016 CLINICAL DATA:  Difficulty with urination.  Chest pain. EXAM: CHEST  2 VIEW COMPARISON:  01/25/2014 FINDINGS: The heart size and mediastinal contours are within normal limits. Moderate size hiatal hernia identified. Airspace opacity within the left posterior lung base is identified. IMPRESSION: 1. Left lower lobe pneumonia suspected. Electronically Signed   By: Kerby Moors M.D.   On: 02/05/2016 15:19     ASSESSMENT AND PLAN:   60 year old male with cerebral palsy who presented with nausea and found to have community-acquired pneumonia.  1. Left lower lobe community-acquired pneumonia: Continue Levaquin. Follow up on sputum cultures.  2. Abdominal pain with nausea and vomiting: Order KUB and right upper quadrant abdominal ultrasound. Continue supportive care for now.  3. History of DVT and Coumadin: Continue management as per pharmacy.  4. Cervical palsy: Patient bedbound  5. History of depression/mood disorder: Continue Abilify  Management plans discussed with the patient and he is in agreement. Rounded with nursing staff today  CODE STATUS: full  TOTAL TIME TAKING CARE OF THIS PATIENT: 30 minutes.     POSSIBLE D/C tomorrow, DEPENDING ON CLINICAL CONDITION.  Carlito Bogert M.D on 02/06/2016 at 11:08 AM  Between 7am to 6pm - Pager - [phone] After 6pm go to [email or web address] - password [email or web address]  [facility]  Office  [phone]  CC: Primary care physician; Keith Rake, MD  Note: This dictation was prepared with Dragon dictation along with smaller phrase technology. Any transcriptional errors that result from this process are unintentional.

## 2016-02-06 NOTE — Progress Notes (Signed)
KUB Ileus?? Partial SBO CT ABD ordered and surg eval

## 2016-02-07 ENCOUNTER — Telehealth: Payer: Self-pay | Admitting: Family Medicine

## 2016-02-07 LAB — PROTIME-INR
INR: 2.9
PROTHROMBIN TIME: 30.9 s — AB (ref 11.4–15.2)

## 2016-02-07 LAB — GLUCOSE, CAPILLARY
GLUCOSE-CAPILLARY: 91 mg/dL (ref 65–99)
Glucose-Capillary: 90 mg/dL (ref 65–99)

## 2016-02-07 MED ORDER — LEVOFLOXACIN 750 MG PO TABS: 750.0000 mg | ORAL_TABLET | Freq: Every day | ORAL | 0 refills | Status: DC

## 2016-02-07 NOTE — Progress Notes (Signed)
ANTICOAGULATION CONSULT NOTE - follow up [place] for warfarin Indication: DVT  Allergies  Allergen Reactions  . Penicillins     Sickness/sweating  . Sulfa Antibiotics Itching    Patient Measurements: Weight: 127 lb 14.4 oz (58 kg)  Vital Signs: Temp: 98.2 F (36.8 C) (09/15 0732) Temp Source: Oral (09/15 0732) BP: 113/79 (09/15 0732) Pulse Rate: 83 (09/15 0732)  Labs:  Recent Labs  02/05/16 1450 02/06/16 0458 02/07/16 0452  HGB 13.9 11.3*  --   HCT 39.0* 32.0*  --   PLT 158 109*  --   LABPROT 29.5* 31.2* 30.9*  INR 2.73 2.93 2.90  CREATININE 0.54* 0.48*  --   TROPONINI <0.03  --   --     Estimated Creatinine Clearance: 81.6 mL/min (by C-G formula based on SCr of 0.48 mg/dL (L)).   Medical History: Past Medical History:  Diagnosis Date  . Depression   . History of DVT of lower extremity   . Mood disorder ([place])   . Neurogenic bladder   . Urinary incontinence     Assessment: 60 yo male with hx of DVT admitted for treatment of pneumonia. Pharmacy consulted for dosing and monitoring of warfarin. Patient takes Warfarin 5mg  daily at home. Patient is receiving antibiotics (levofloxacin).  9/13 INR: 2.73  Warfarin 5 mg 9/14 INR: 2.93  Warfarin 4.5 mg 9/15 INR: 2.90  Goal of Therapy:  INR 2-3 Monitor platelets by anticoagulation protocol: Yes   Plan:  Will continue Warfarin to 4.5 mg daily. Will monitor INR daily while on antibiotics. CBC at least every 3 days. Drug interactions: Levaquin.   Chinita Greenland PharmD Clinical Pharmacist 02/07/2016  7:38 AM

## 2016-02-07 NOTE — Telephone Encounter (Signed)
[facility] nurse called, family member called about interaction between Gerald Montgomery and coumadin Chart reviewed; just discharged from hospital with pneumonia Please call family, find out [facility] manages his coumadin He'll want to have his INR checked on Monday and report INR to his managing doctor

## 2016-02-07 NOTE — Care Management Important Message (Signed)
Important Message  Patient Details  Name: Gerald Montgomery MRN: [id number] Date of Birth: 14-Jun-1955   Medicare Important Message Given:  N/A - LOS <3 / Initial given by admissions    Alvie Heidelberg, RN 02/07/2016, 9:37 AM

## 2016-02-07 NOTE — Progress Notes (Signed)
Patient discharged home. DC instructions provided and explained. Medications reviewed. Rx given. All questions answered. Levaquin electronically sent to pharmacy. Pt stable at discharge

## 2016-02-07 NOTE — Progress Notes (Signed)
Pharmacy Antibiotic Note  Gerald Montgomery is a 60 y.o. male admitted on 02/05/2016 with pneumonia.  Pharmacy has been consulted for Levofloxacin dosing.  Plan: Will continue the patient on Levofloxacin 750mg  IV daily.  Will continue monitor renal function and adjust dose as needed.   Weight: 127 lb 14.4 oz (58 kg)  Temp (24hrs), Avg:98.1 F (36.7 C), Min:97.6 F (36.4 C), Max:99.1 F (37.3 C)   Recent Labs Lab 02/05/16 1450 02/06/16 0458  WBC 9.7 5.9  CREATININE 0.54* 0.48*  LATICACIDVEN 1.1  --     Estimated Creatinine Clearance: 81.6 mL/min (by C-G formula based on SCr of 0.48 mg/dL (L)).    Allergies  Allergen Reactions  . Penicillins     Sickness/sweating  . Sulfa Antibiotics Itching    Antimicrobials this admission: 9/13 levofloxacin >>    Microbiology results: 9/13 BCx: pending 9/13 UCx: pending 9/13 Sputum: pending    Thank you for allowing pharmacy to be a part of this patient's care.  Nancy Fetter, PharmD Clinical Pharmacist 02/07/2016 8:57 AM

## 2016-02-07 NOTE — Telephone Encounter (Signed)
Pt wife notified and advised

## 2016-02-07 NOTE — Care Management (Signed)
Patient will return home with family, resumption of home health orders placed and signed by attending. [facility] notified of discharge.

## 2016-02-07 NOTE — Discharge Summary (Signed)
[facility] [place] NAME: Gerald Montgomery    MR#:  [id number]  DATE OF BIRTH:  1955-12-09  DATE OF ADMISSION:  02/05/2016 ADMITTING PHYSICIAN: Hillary Bow, MD  DATE OF DISCHARGE: 02/07/16  PRIMARY CARE PHYSICIAN: Keith Rake, MD    ADMISSION DIAGNOSIS:  Left lower lobe pneumonia [J18.9] Sepsis, due to unspecified organism ([place]) [A41.9]  DISCHARGE DIAGNOSIS:  Active Problems:   Pneumonia sepsis present on admission   SECONDARY DIAGNOSIS:   Past Medical History:  Diagnosis Date  . Depression   . History of DVT of lower extremity   . Mood disorder ([place])   . Neurogenic bladder   . Urinary incontinence     HOSPITAL COURSE:  Gerald Montgomery  is a 60 y.o. male admitted 02/05/2016 with chief complaint Chest Pain . Please see H&P performed by Hillary Bow, MD for further information. Patient presented with the above symptoms, found to have pneumonia on imagining. Started on IV antibiotics with improvement. He underwent abdominal imaging for chronic complaints of nausea, no acute findings.  DISCHARGE CONDITIONS:   stable  CONSULTS OBTAINED:  Treatment Team:  Florene Glen, MD  DRUG ALLERGIES:   Allergies  Allergen Reactions  . Penicillins     Sickness/sweating  . Sulfa Antibiotics Itching    DISCHARGE MEDICATIONS:   Current Discharge Medication List    START taking these medications   Details  levofloxacin (LEVAQUIN) 750 MG tablet Take 1 tablet (750 mg total) by mouth daily. Qty: 4 tablet, Refills: 0      CONTINUE these medications which have NOT CHANGED   Details  ARIPiprazole (ABILIFY) 15 MG tablet TAKE 1 TABLET(15 MG) BY MOUTH DAILY Qty: 90 tablet, Refills: 0    bethanechol (URECHOLINE) 50 MG tablet TAKE 1 TABLET(50 MG) BY MOUTH THREE TIMES DAILY Qty: 90 tablet, Refills: 3    warfarin (COUMADIN) 5 MG tablet TAKE 1 TABLET(5 MG) BY MOUTH DAILY Qty: 30 tablet, Refills: 11         DISCHARGE INSTRUCTIONS:      DIET:  Regular diet  DISCHARGE CONDITION:  Stable  ACTIVITY:  Activity as tolerated  OXYGEN:  Home Oxygen: No.   Oxygen Delivery: room air  DISCHARGE LOCATION:  home   If you experience worsening of your admission symptoms, develop shortness of breath, life threatening emergency, suicidal or homicidal thoughts you must seek medical attention immediately by calling 911 or calling your MD immediately  if symptoms less severe.  You Must read complete instructions/literature along with all the possible adverse reactions/side effects for all the Medicines you take and that have been prescribed to you. Take any new Medicines after you have completely understood and accpet all the possible adverse reactions/side effects.   Please note  You were cared for by a hospitalist during your hospital stay. If you have any questions about your discharge medications or the care you received while you were in the hospital after you are discharged, you can call the unit and asked to speak with the hospitalist on call if the hospitalist that took care of you is not available. Once you are discharged, your primary care physician will handle any further medical issues. Please note that NO REFILLS for any discharge medications will be authorized once you are discharged, as it is imperative that you return to your primary care physician (or establish a relationship with a primary care physician if you do not have one) for your aftercare needs so that they  can reassess your need for medications and monitor your lab values.    On the day of Discharge:   VITAL SIGNS:  Blood pressure 113/79, pulse 83, temperature 98.2 F (36.8 C), temperature source Oral, resp. rate 16, weight 58 kg (127 lb 14.4 oz), SpO2 96 %.  I/O:   Intake/Output Summary (Last 24 hours) at 02/07/16 0929 Last data filed at 02/07/16 0857  Gross per 24 hour  Intake                0 ml  Output                0 ml  Net                 0 ml    PHYSICAL EXAMINATION:  GENERAL:  60 y.o.-year-old patient lying in the bed with no acute distress.  EYES: Pupils equal, round, reactive to light and accommodation. No scleral icterus. Extraocular muscles intact.  HEENT: Head atraumatic, normocephalic. Oropharynx and nasopharynx clear.  NECK:  Supple, no jugular venous distention. No thyroid enlargement, no tenderness.  LUNGS: Normal breath sounds bilaterally, no wheezing, rales,rhonchi or crepitation. No use of accessory muscles of respiration.  CARDIOVASCULAR: S1, S2 normal. No murmurs, rubs, or gallops.  ABDOMEN: Soft, non-tender, non-distended. Bowel sounds present. No organomegaly or mass.  EXTREMITIES: contractures and wasting, most prominent in lower extremities, No pedal edema, cyanosis, or clubbing.  NEUROLOGIC: Cranial nerves II through XII are intact.  Sensation intact. Gait not checked.  PSYCHIATRIC: The patient is alert and oriented x 3.  SKIN: No obvious rash, lesion, or ulcer.   DATA REVIEW:   CBC  Recent Labs Lab 02/06/16 0458  WBC 5.9  HGB 11.3*  HCT 32.0*  PLT 109*    Chemistries   Recent Labs Lab 02/05/16 1450 02/06/16 0458  NA 135 139  K 3.9 3.8  CL 104 114*  CO2 24 23  GLUCOSE 121* 102*  BUN 25* 15  CREATININE 0.54* 0.48*  CALCIUM 8.9 7.7*  AST 26  --   ALT 33  --   ALKPHOS 74  --   BILITOT 0.8  --     Cardiac Enzymes  Recent Labs Lab 02/05/16 1450  TROPONINI <0.03    Microbiology Results  Results for orders placed or performed during the hospital encounter of 02/05/16  Blood Culture (routine x 2)     Status: None (Preliminary result)   Collection Time: 02/05/16  3:40 PM  Result Value Ref Range Status   Specimen Description BLOOD RIGHT FORE ARM  Final   Special Requests   Final    BOTTLES DRAWN AEROBIC AND ANAEROBIC AER [place] ANA [place]   Culture NO GROWTH < 12 HOURS  Final   Report Status PENDING  Incomplete  Blood Culture (routine x 2)     Status: None (Preliminary result)    Collection Time: 02/05/16  3:43 PM  Result Value Ref Range Status   Specimen Description BLOOD LEFT FORE ARM  Final   Special Requests   Final    BOTTLES DRAWN AEROBIC AND ANAEROBIC AER [place] ANA 5CC   Culture NO GROWTH < 12 HOURS  Final   Report Status PENDING  Incomplete    RADIOLOGY:  Dg Chest 2 View  Result Date: 02/05/2016 CLINICAL DATA:  Difficulty with urination.  Chest pain. EXAM: CHEST  2 VIEW COMPARISON:  01/25/2014 FINDINGS: The heart size and mediastinal contours are within normal limits. Moderate size hiatal hernia identified. Airspace opacity  within the left posterior lung base is identified. IMPRESSION: 1. Left lower lobe pneumonia suspected. Electronically Signed   By: Kerby Moors M.D.   On: 02/05/2016 15:19   Dg Abd 1 View  Result Date: 02/06/2016 CLINICAL DATA:  Nausea, vomiting EXAM: ABDOMEN - 1 VIEW COMPARISON:  CT of the abdomen of 11/22/2015 FINDINGS: Large and small bowel gas is present without significant distension most likely representing ileus. An early developing small bowel partial obstruction cannot be excluded and clinical correlation is recommended. No opaque calculi are seen. Prior left hip pinning is noted. IMPRESSION: Probable ileus. Difficult to exclude early partial SBO. Consider followup with plain films or CT the abdomen and pelvis. Electronically Signed   By: Ivar Drape M.D.   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 There is no pain over the gallbladder with compression. Common bile duct: Diameter: The common bile duct is normal measuring 4.0 mm in diameter. Liver: The liver is normal in echogenicity. No focal hepatic abnormality is seen. IVC: The IVC is largely obscured by bowel gas.  Pancreas: The pancreas also is completely obscured by bowel gas and cannot be evaluated. Spleen: The spleen is normal measuring 5.6 cm. Right Kidney: Length: 10.6 cm.  No hydronephrosis is seen. Left Kidney: Length: 10.9 cm.  No hydronephrosis is noted. Abdominal aorta: The abdominal aorta is normal in caliber. Other findings: The study is somewhat limited due to patient motion. IMPRESSION: 1. No gallstones.  No ductal dilatation. 2. No hepatic abnormality. 3. The pancreas is completely obscured by bowel gas. 4. No hydronephrosis. Electronically Signed   By: Ivar Drape M.D.   On: 02/06/2016 11:58   Ct Abdomen Pelvis W Contrast  Result Date: 02/06/2016 CLINICAL DATA:  Small bowel obstruction. Nausea and generalized abdominal pain. EXAM: CT ABDOMEN AND PELVIS WITH CONTRAST TECHNIQUE: Multidetector CT imaging of the abdomen and pelvis was performed using the standard protocol following bolus administration of intravenous contrast. CONTRAST:  135mL ISOVUE-300 IOPAMIDOL (ISOVUE-300) INJECTION 61% COMPARISON:  Radiographs and ultrasound earlier this day. CT 11/22/2015 FINDINGS: Lower chest: Small bilateral pleural effusions, left greater than right. Associated airspace opacities in the lung bases are likely atelectasis, however difficult to exclude pneumonia particularly on the left. Hepatobiliary: No focal liver abnormality is seen. No gallstones, gallbladder wall thickening, or biliary dilatation. Pancreas: Mild parenchymal atrophy. No ductal dilatation or inflammation. Spleen: Normal in size without focal abnormality. Adrenals/Urinary Tract: No adrenal nodule. Small bilateral low-density renal lesions, larger representing cysts, all smaller too small to characterize, however unchanged from prior CT. Using decompressed. Bladder is distended and thick walled small bladder diverticulum on the left. Stomach/Bowel: Moderate hiatal hernia. No small bowel obstruction, enteric contrast reaches the transverse colon. There  is a transient jejunal jejunal intussusception in the left upper quadrant of the abdomen spanning 3.6 cm, no associated edema. Enteric contrast passes distal to this. No abnormal small bowel distention. The appendix is normal. Sigmoid colon is tortuous. Decreased rectal stool burden from prior. There is bowel wall thickening from the distal sigmoid colon through the rectum which appears chronic, however is nonspecific. Mild adjacent soft tissue stranding and edema. Vascular/Lymphatic: Aortic atherosclerosis. No enlarged abdominal or pelvic lymph nodes. Reproductive: Prostate is unremarkable. Other: No free air, free fluid, or intra-abdominal  fluid collection. Musculoskeletal: Bilateral L5 pars defects. There are no acute or suspicious osseous abnormalities. Postsurgical change in the left hip, partially included. IMPRESSION: 1. No small bowel obstruction, enteric contrast reaches the transverse colon. 2. Wall thickening from in the sigmoid colon with the rectum, decreased stool burden from prior CT. Mild edema and soft tissue stranding adjacent to wall thickening is likely chronic, however difficult to exclude mild acute colitis. 3. Transient jejunal-jejunal intussusception in the left upper quadrant of the abdomen. In a patient of this age this is considered incidental. 4. Chronic bladder wall thickening and urinary bladder diverticulum consistent with neurogenic bladder. There is mild perivesicular edema which can be seen with acute urinary tract infection. 5. Moderate hiatal hernia. Electronically Signed   By: Jeb Levering M.D.   On: 02/06/2016 19:00     Management plans discussed with the patient, family and they are in agreement.  CODE STATUS:     Code Status Orders        Start     Ordered   02/05/16 1829  Full code  Continuous     02/05/16 1829    Code Status History    Date Active Date Inactive Code Status Order ID Comments User Context   This patient has a current code status but no  historical code status.      TOTAL TIME TAKING CARE OF THIS PATIENT: 33 minutes.    Oline Belk,  Karenann Cai.D on 02/07/2016 at 9:29 AM  Between 7am to 6pm - Pager - [phone]  After 6pm go to [email or web address] - Proofreader  [facility]  Office  [phone]  CC: Primary care physician; Keith Rake, MD

## 2016-02-08 LAB — URINE CULTURE: SPECIAL REQUESTS: NORMAL

## 2016-02-10 LAB — CULTURE, BLOOD (ROUTINE X 2)
CULTURE: NO GROWTH
CULTURE: NO GROWTH

## 2016-02-10 NOTE — Telephone Encounter (Signed)
Patient has appointment for this week

## 2016-02-12 ENCOUNTER — Encounter: Payer: Self-pay | Admitting: Family Medicine

## 2016-02-12 ENCOUNTER — Ambulatory Visit (INDEPENDENT_AMBULATORY_CARE_PROVIDER_SITE_OTHER): Payer: Medicare Other | Admitting: Family Medicine

## 2016-02-12 DIAGNOSIS — H6122 Impacted cerumen, left ear: Secondary | ICD-10-CM

## 2016-02-12 DIAGNOSIS — J181 Lobar pneumonia, unspecified organism: Principal | ICD-10-CM

## 2016-02-12 DIAGNOSIS — H6120 Impacted cerumen, unspecified ear: Secondary | ICD-10-CM | POA: Insufficient documentation

## 2016-02-12 DIAGNOSIS — J189 Pneumonia, unspecified organism: Secondary | ICD-10-CM | POA: Diagnosis not present

## 2016-02-12 NOTE — Progress Notes (Signed)
Name: Gerald Montgomery   MRN: [id number]    DOB: [birth year]   Date:02/12/2016       Progress Note  Subjective  Chief Complaint  Chief Complaint  Patient presents with  . Follow-up    Otalgia   There is pain in the left ear. This is a new problem. The current episode started yesterday. There has been no fever. Pertinent negatives include no coughing, ear discharge, neck pain or sore throat.    Pt. Presents for hospital follow-up. He was admitted to [facility] for 4 days with left lower lobe pneumonia and sepsis. HE was started on IV ABx and transitioned to oral Levofloxacin (pt. has completed course). Blood Cultures were negative. Pt. Feels better, back to oral intake, no fevers, chills, cough, or dyspnea.     Past Medical History:  Diagnosis Date  . Depression   . History of DVT of lower extremity   . Mood disorder ([place])   . Neurogenic bladder   . Urinary incontinence     Past Surgical History:  Procedure Laterality Date  . filter surgery for blood clot  2013  . HIP FRACTURE SURGERY  2013    Family History  Problem Relation Age of Onset  . Heart disease Mother   . Atrial fibrillation Mother   . Diabetes Mother     Borderline  . Heart disease Father     Passed away of heart attack  . Kidney disease Neg Hx   . Prostate cancer Neg Hx     Social History   Social History  . Marital status: Single    Spouse name: N/A  . Number of children: N/A  . Years of education: N/A   Occupational History  . Not on file.   Social History Main Topics  . Smoking status: Never Smoker  . Smokeless tobacco: Never Used  . Alcohol use No  . Drug use: No  . Sexual activity: No   Other Topics Concern  . Not on file   Social History Narrative  . No narrative on file     Current Outpatient Prescriptions:  .  ARIPiprazole (ABILIFY) 15 MG tablet, TAKE 1 TABLET(15 MG) BY MOUTH DAILY, Disp: 90 tablet, Rfl: 0 .  bethanechol (URECHOLINE) 50 MG tablet, TAKE 1 TABLET(50 MG) BY MOUTH THREE  TIMES DAILY, Disp: 90 tablet, Rfl: 3 .  warfarin (COUMADIN) 5 MG tablet, TAKE 1 TABLET(5 MG) BY MOUTH DAILY, Disp: 30 tablet, Rfl: 11 .  levofloxacin (LEVAQUIN) 750 MG tablet, Take 1 tablet (750 mg total) by mouth daily. (Patient not taking: Reported on 02/12/2016), Disp: 4 tablet, Rfl: 0  Allergies  Allergen Reactions  . Penicillins     Sickness/sweating  . Sulfa Antibiotics Itching     Review of Systems  Constitutional: Positive for malaise/fatigue. Negative for chills and fever.  HENT: Positive for ear pain. Negative for ear discharge and sore throat.   Respiratory: Negative for cough and shortness of breath.   Musculoskeletal: Negative for neck pain.    Objective  Vitals:   02/12/16 1002  BP: 114/77  Pulse: 83  Resp: 17  Temp: 98 F (36.7 C)  TempSrc: Oral  SpO2: 96%    Physical Exam  Constitutional: He is well-developed, well-nourished, and in no distress.  HENT:  Right Ear: Tympanic membrane and ear canal normal.  Left ear canal cerumen impaction, unable to visualize TM.  Cardiovascular: Normal rate, regular rhythm, S1 normal, S2 normal and normal heart sounds.   No murmur heard.  Pulmonary/Chest: Effort normal. No respiratory distress. He has no decreased breath sounds. He has no wheezes. He has rales in the left lower field.  Abdominal: Soft. Bowel sounds are normal. There is no tenderness.  Neurological: He is alert.  Psychiatric: Mood and affect normal.  Nursing note and vitals reviewed.   Assessment & Plan  1. LLL pneumonia Improving, clinically looks much better. Finished the course of antibiotics. Obtain repeat chest x-ray to ensure radiographic resolution - DG Chest 2 View; Future  2. Cerumen impaction, left  - Ear Lavage    Vallory Oetken [facility] 02/12/2016 10:19 AM

## 2016-02-13 NOTE — Telephone Encounter (Signed)
Please close chart if this is complete.

## 2016-02-24 ENCOUNTER — Telehealth: Payer: Self-pay | Admitting: Family Medicine

## 2016-02-24 NOTE — Telephone Encounter (Signed)
Antonietta Breach, RN from [employer] called wanting to know if Dr. Manuella Ghazi wanted them to continue to do PT/INR monthly for patient and when does Dr. Manuella Ghazi want the next PT/INR done?  Please call [name] @ [phone]

## 2016-02-24 NOTE — Telephone Encounter (Signed)
Patient's last INR was therapeutic at 2.9, obtained on 02/12/2016, advised to repeat INR on 03/13/2016.

## 2016-03-04 ENCOUNTER — Ambulatory Visit (INDEPENDENT_AMBULATORY_CARE_PROVIDER_SITE_OTHER): Payer: Medicare Other | Admitting: Family Medicine

## 2016-03-04 ENCOUNTER — Ambulatory Visit
Admission: RE | Admit: 2016-03-04 | Discharge: 2016-03-04 | Disposition: A | Discharge status: Home / Self Care | Payer: Medicare Other | Source: Ambulatory Visit | Attending: Family Medicine | Admitting: Family Medicine

## 2016-03-04 ENCOUNTER — Encounter: Payer: Self-pay | Admitting: Family Medicine

## 2016-03-04 VITALS — BP 120/70 | HR 73 | Temp 97.9°F | Resp 16

## 2016-03-04 DIAGNOSIS — I700 Atherosclerosis of aorta: Secondary | ICD-10-CM | POA: Diagnosis not present

## 2016-03-04 DIAGNOSIS — Z8701 Personal history of pneumonia (recurrent): Secondary | ICD-10-CM | POA: Diagnosis not present

## 2016-03-04 DIAGNOSIS — Z13220 Encounter for screening for lipoid disorders: Secondary | ICD-10-CM

## 2016-03-04 DIAGNOSIS — J181 Lobar pneumonia, unspecified organism: Secondary | ICD-10-CM | POA: Diagnosis not present

## 2016-03-04 DIAGNOSIS — R739 Hyperglycemia, unspecified: Secondary | ICD-10-CM

## 2016-03-04 DIAGNOSIS — Z23 Encounter for immunization: Secondary | ICD-10-CM

## 2016-03-04 DIAGNOSIS — J189 Pneumonia, unspecified organism: Secondary | ICD-10-CM

## 2016-03-04 DIAGNOSIS — J449 Chronic obstructive pulmonary disease, unspecified: Secondary | ICD-10-CM | POA: Diagnosis not present

## 2016-03-04 LAB — LIPID PANEL
CHOLESTEROL: 200 mg/dL (ref 125–200)
HDL: 48 mg/dL (ref >=40)
LDL Cholesterol: 130 mg/dL — ABNORMAL HIGH (ref <130)
TRIGLYCERIDES: 109 mg/dL (ref <150)
Total CHOL/HDL Ratio: 4.2 Ratio (ref <=5.0)
VLDL: 22 mg/dL (ref <30)

## 2016-03-04 LAB — COMPLETE METABOLIC PANEL WITH GFR
ALT: 16 U/L (ref 9–46)
AST: 16 U/L (ref 10–35)
Albumin: 4.1 g/dL (ref 3.6–5.1)
Alkaline Phosphatase: 71 U/L (ref 40–115)
BUN: 18 mg/dL (ref 7–25)
CALCIUM: 8.6 mg/dL (ref 8.6–10.3)
CHLORIDE: 107 mmol/L (ref 98–110)
CO2: 24 mmol/L (ref 20–31)
CREATININE: 0.54 mg/dL — AB (ref 0.70–1.33)
GFR, Est African American: >89 mL/min (ref >=60)
GFR, Est Non African American: >89 mL/min (ref >=60)
Glucose, Bld: 85 mg/dL (ref 65–99)
Potassium: 3.8 mmol/L (ref 3.5–5.3)
Sodium: 142 mmol/L (ref 135–146)
Total Bilirubin: 0.4 mg/dL (ref 0.2–1.2)
Total Protein: 6.5 g/dL (ref 6.1–8.1)

## 2016-03-04 LAB — POCT GLYCOSYLATED HEMOGLOBIN (HGB A1C): HEMOGLOBIN A1C: 5.2

## 2016-03-04 NOTE — Progress Notes (Signed)
Name: Gerald Montgomery   MRN: [id number]    DOB: [birth year]   Date:03/04/2016       Progress Note  Subjective  Chief Complaint  Chief Complaint  Patient presents with  . Follow-up    HPI  Pt. Returns for follow up of Pneumonia, he had LLL pneumonia with sepsis, started on IV antibiotics, transitioned to oral antibiotics and just had a CXR this morning as part of his follow up. He has no symptoms of pneumonia including fevers, chills, cough, or dyspnea.    Past Medical History:  Diagnosis Date  . Depression   . History of DVT of lower extremity   . Mood disorder ([place])   . Neurogenic bladder   . Urinary incontinence     Past Surgical History:  Procedure Laterality Date  . filter surgery for blood clot  2013  . HIP FRACTURE SURGERY  2013    Family History  Problem Relation Age of Onset  . Heart disease Mother   . Atrial fibrillation Mother   . Diabetes Mother     Borderline  . Heart disease Father     Passed away of heart attack  . Kidney disease Neg Hx   . Prostate cancer Neg Hx     Social History   Social History  . Marital status: Single    Spouse name: N/A  . Number of children: N/A  . Years of education: N/A   Occupational History  . Not on file.   Social History Main Topics  . Smoking status: Never Smoker  . Smokeless tobacco: Never Used  . Alcohol use No  . Drug use: No  . Sexual activity: No   Other Topics Concern  . Not on file   Social History Narrative  . No narrative on file     Current Outpatient Prescriptions:  .  ARIPiprazole (ABILIFY) 15 MG tablet, TAKE 1 TABLET(15 MG) BY MOUTH DAILY, Disp: 90 tablet, Rfl: 0 .  bethanechol (URECHOLINE) 50 MG tablet, TAKE 1 TABLET(50 MG) BY MOUTH THREE TIMES DAILY, Disp: 90 tablet, Rfl: 3 .  warfarin (COUMADIN) 5 MG tablet, TAKE 1 TABLET(5 MG) BY MOUTH DAILY, Disp: 30 tablet, Rfl: 11  Allergies  Allergen Reactions  . Penicillins     Sickness/sweating  . Sulfa Antibiotics Itching     Review  of Systems  Constitutional: Negative for chills, fever, malaise/fatigue and weight loss.  Respiratory: Negative for cough, sputum production and shortness of breath.   Cardiovascular: Negative for chest pain.  Gastrointestinal: Negative for abdominal pain.  Neurological: Negative for headaches.      Objective  Vitals:   03/04/16 0928  BP: 120/70  Pulse: 73  Resp: 16  Temp: 97.9 F (36.6 C)  SpO2: 97%    Physical Exam  Constitutional: He is oriented to person, place, and time and well-developed, well-nourished, and in no distress.  HENT:  Head: Normocephalic and atraumatic.  Cardiovascular: Normal rate, regular rhythm, S1 normal and S2 normal.   No murmur heard. Pulmonary/Chest: Effort normal and breath sounds normal. He has no wheezes. He has no rhonchi.  Abdominal: Soft. Bowel sounds are normal. There is no tenderness.  Musculoskeletal:       Right ankle: He exhibits no swelling.       Left ankle: He exhibits no swelling.  contractures in lower extremities.  Neurological: He is alert and oriented to person, place, and time.  Psychiatric: Mood, affect and judgment normal.  Nursing note and vitals reviewed.  Assessment & Plan  1. Pneumonia of left lower lobe due to infectious organism [facility]) Chest x-ray shows interval clearing of left lower lobe pneumonia with underlying COPD.  2. Hyperglycemia Hemoglobin A1c is 5.2%, which is considered normal - POCT HgB A1C  3. Screening for lipid disorders Obtain lipid panel and calculate 10 year risk of cardiovascular disease. - COMPLETE METABOLIC PANEL WITH GFR - Lipid Profile  4. Need for influenza vaccination  - Flu Vaccine QUAD 36+ mos PF IM (Fluarix & Fluzone Quad PF)   Maisey Deandrade [facility] 03/04/2016 10:47 AM

## 2016-03-12 ENCOUNTER — Telehealth: Payer: Self-pay | Admitting: Family Medicine

## 2016-03-12 DIAGNOSIS — Z5181 Encounter for therapeutic drug level monitoring: Secondary | ICD-10-CM | POA: Diagnosis not present

## 2016-03-12 DIAGNOSIS — Z7901 Long term (current) use of anticoagulants: Secondary | ICD-10-CM | POA: Diagnosis not present

## 2016-03-12 LAB — PROTIME-INR: INR: 3.9 — AB (ref 0.9–1.1)

## 2016-03-12 NOTE — Telephone Encounter (Signed)
Patient should hold Coumadin for 4 days, October 19th, 20th, 21st, and 22nd and recheck on 03/16/2016.

## 2016-03-12 NOTE — Telephone Encounter (Signed)
Vaughan Basta from [facility] called to give stat lab results. PT: 37.3 INR: 3.9 (did this through blood draw) Would like to know if they should hold the coumodin, change the dosage and when should they recheck it. Please return call [phone]

## 2016-03-16 ENCOUNTER — Telehealth: Payer: Self-pay | Admitting: Emergency Medicine

## 2016-03-16 NOTE — Telephone Encounter (Signed)
Antonietta Breach from [facility] called and stated Gerald Montgomery PT /INR was 15.7 and 1.3 INR. Office had spoken to you on Friday since it was high and medication was stopped. Per Dr. Manuella Ghazi restart medication (Coumadin today at 7.5 mg MON and TUES) then 5 mg a day starting on Wednesday. Recheck on Friday. Homecare is aware. Dr. Manuella Ghazi notified them today by phone

## 2016-03-17 NOTE — Telephone Encounter (Signed)
Gerald Montgomery from [facility] called and stated Gerald Montgomery PT /INR was 15.7 and 1.3 INR. Office had spoken to you on Friday since it was high and medication was stopped. Per Dr. Manuella Ghazi restart medication (Coumadin today at 7.5 mg MON and TUES) then 5 mg a day starting on Wednesday. Recheck on Friday. Homecare is aware. Dr. Manuella Ghazi notified them today by phone

## 2016-03-20 ENCOUNTER — Encounter: Payer: Self-pay | Admitting: Family Medicine

## 2016-03-20 ENCOUNTER — Telehealth: Payer: Self-pay | Admitting: Family Medicine

## 2016-03-20 NOTE — Telephone Encounter (Signed)
Larena Glassman from [facility] called with PT INR #'s, INR [phone] if you need to contact them.

## 2016-03-24 NOTE — Telephone Encounter (Signed)
Returned call and left a voice message. INR is therapeutic and he may continue on Coumadin 5 mg daily and recheck INR in 4 weeks

## 2016-04-13 ENCOUNTER — Telehealth: Payer: Self-pay | Admitting: Family Medicine

## 2016-04-13 DIAGNOSIS — Z7901 Long term (current) use of anticoagulants: Secondary | ICD-10-CM | POA: Diagnosis not present

## 2016-04-13 DIAGNOSIS — Z5181 Encounter for therapeutic drug level monitoring: Secondary | ICD-10-CM | POA: Diagnosis not present

## 2016-04-13 LAB — PROTIME-INR: INR: 4.6 — AB (ref 0.9–1.1)

## 2016-04-13 NOTE — Telephone Encounter (Signed)
INR is 4.6 On 5 mg warfarin daily currently per staff I reviewed chart; had previously been on 7.5 mg two days a week, 5 mg daily; then was high and held, then down to 1.3; sounds difficult to manage I called Venida Jarvis, [phone] No bleeding per family of patient Instructions: NO coumadin today Will leave for Dr. Manuella Ghazi to review upon his return tomorrow to decide about further management -------------- Dr. Manuella Ghazi, please contact [name] on Tuesday Nov 21st with your additional orders regarding this patient's coumadin management Thank you

## 2016-04-14 NOTE — Telephone Encounter (Signed)
Return call and spoke with Charlann Boxer. Patient's INR was 4.6 on Monday, 1120. Asked to hold Coumadin for 3 days Monday, Tuesday, Wednesday from 11/20-11/22,  restart at 5 mg on Thursday 11/23 and check INR again on Friday 11/24. she verbalized understanding

## 2016-04-14 NOTE — Telephone Encounter (Signed)
Gerald Montgomery from [facility] is requesting that you please return her call today pertaining to patient INR. She can be reached at [phone]

## 2016-04-22 ENCOUNTER — Telehealth: Payer: Self-pay | Admitting: Family Medicine

## 2016-04-22 ENCOUNTER — Encounter: Payer: Self-pay | Admitting: Family Medicine

## 2016-04-22 NOTE — Telephone Encounter (Signed)
INR is therapeutic, advised to continue on Coumadin 5 mg daily and recheck INR in one month. Gerald Montgomery verbalized understanding

## 2016-04-22 NOTE — Telephone Encounter (Signed)
Gerald Montgomery with [facility] says that the patients pt/ inr was PT  was 32.6 and INR was 2.7. He is on 5 mg of coumidin a day. Please advise what to do.

## 2016-04-30 ENCOUNTER — Telehealth: Payer: Self-pay | Admitting: Emergency Medicine

## 2016-04-30 NOTE — Telephone Encounter (Signed)
FYIAntonietta Breach from [facility] called stating that she will send you a order for a gel overlay for patients bed to be signed. [name] stated that family called and stated patient had a ulcer on his back. Went out to the home to evaluate and it was contact dermatitis lower back and groin. Will use a cream and barrier. Will contact office after recheck in a couple days if anything is needed.

## 2016-04-30 NOTE — Telephone Encounter (Signed)
Okay, thank you. If patient needs to be seen for the rash on his back, please schedule an appointment

## 2016-05-06 ENCOUNTER — Telehealth: Payer: Self-pay | Admitting: Family Medicine

## 2016-05-06 NOTE — Telephone Encounter (Signed)
Caryl Pina from [facility] called on behalf of pt and is asking for a call back. [phone]

## 2016-05-12 NOTE — Telephone Encounter (Signed)
Spoke with Caryl Pina from [facility] and she will re fax order form for Dr. Manuella Ghazi approval and signature

## 2016-05-19 ENCOUNTER — Other Ambulatory Visit: Payer: Self-pay | Admitting: Family Medicine

## 2016-05-20 ENCOUNTER — Telehealth: Payer: Self-pay | Admitting: Family Medicine

## 2016-05-20 NOTE — Telephone Encounter (Signed)
Spoke with Charlann Boxer, patient has a supratherapeutic INR at 3.4, advised to hold off Coumadin for 3 days 12/27, 12/28, and 12/29 and recheck INR on 12/30. She verbalized agreement

## 2016-05-20 NOTE — Telephone Encounter (Signed)
Gerald Montgomery from [place] giving PT/INR report: PT: 40.5 INR: 3.4 On 5mg  of coumodin daily  [name] can be reached at [phone]

## 2016-05-23 ENCOUNTER — Other Ambulatory Visit: Payer: Self-pay | Admitting: Family Medicine

## 2016-05-27 NOTE — Telephone Encounter (Signed)
Glitch in the system; I was not getting electronic refill requests from Dec 13th until yesterday; addressed with IT; handling refill requests now --------------------- Patient's coumadin was adjusted, 5 mg on six days per week, 4 mg one day per week; Rx for 90 day supply sent as requested

## 2016-06-04 ENCOUNTER — Telehealth: Payer: Self-pay | Admitting: Family Medicine

## 2016-06-04 NOTE — Telephone Encounter (Signed)
Gerald Montgomery from [place] did PT/INR this morning PT: 40.5 INR: 3.4 Order reads for patient to take 4mg  of coumodin on wednesdays and 5mg  rest of days  (P) [phone]

## 2016-06-04 NOTE — Telephone Encounter (Signed)
INR is supratherapeutic at 3.4, advised to hold off on Coumadin for 3 days, 1/11, 1/12, and 1/13 and recheck on 114.

## 2016-06-11 ENCOUNTER — Other Ambulatory Visit: Payer: Self-pay | Admitting: Family Medicine

## 2016-06-12 ENCOUNTER — Telehealth: Payer: Self-pay | Admitting: Family Medicine

## 2016-06-12 NOTE — Telephone Encounter (Signed)
Gerald Montgomery W [facility] SAY PT JAN 17 DID PT.INR AND PT WAS 22.4 AND INR WAS 1.9 AND HE IS ON 5 MG COUMDIN A DAY. [facility] NEEDS A CALL BACK AT  [phone]

## 2016-06-12 NOTE — Telephone Encounter (Signed)
Routed to Dr. Manuella Ghazi for call back, [place] PT JAN 17 DID PT.INR AND PT WAS 22.4 AND INR WAS 1.9 AND HE IS ON 5 MG COUMDIN A DAY. [facility] NEEDS A CALL BACK AT  [phone]

## 2016-06-15 ENCOUNTER — Ambulatory Visit: Payer: Self-pay | Admitting: Family Medicine

## 2016-06-15 NOTE — Telephone Encounter (Signed)
INR is just below therapeutic range at 1.9, advised to check another reading tomorrow, 06/16/2016 and call back with results. [name] verbalized understanding

## 2016-06-16 ENCOUNTER — Telehealth: Payer: Self-pay | Admitting: Family Medicine

## 2016-06-16 NOTE — Telephone Encounter (Signed)
INR is therapeutic, he needs to continue on Coumadin 5 mg daily, recheck INR in 4 weeks

## 2016-06-16 NOTE — Telephone Encounter (Signed)
Gerald Montgomery from [place] giving PT/INR report PT: 33.2 INR: 2.8 He is on 5 mg of Coumadin per day. She can be reached at [phone]

## 2016-06-17 ENCOUNTER — Ambulatory Visit (INDEPENDENT_AMBULATORY_CARE_PROVIDER_SITE_OTHER): Payer: Medicare Other | Admitting: Family Medicine

## 2016-06-17 ENCOUNTER — Encounter: Payer: Self-pay | Admitting: Family Medicine

## 2016-06-17 VITALS — BP 120/66 | HR 92 | Temp 97.7°F | Resp 17

## 2016-06-17 DIAGNOSIS — F39 Unspecified mood [affective] disorder: Secondary | ICD-10-CM

## 2016-06-17 DIAGNOSIS — Z742 Need for assistance at home and no other household member able to render care: Secondary | ICD-10-CM | POA: Diagnosis not present

## 2016-06-17 DIAGNOSIS — N319 Neuromuscular dysfunction of bladder, unspecified: Secondary | ICD-10-CM

## 2016-06-17 MED ORDER — ARIPIPRAZOLE 15 MG PO TABS: ORAL_TABLET | ORAL | 0 refills | Status: DC

## 2016-06-17 NOTE — Progress Notes (Signed)
Name: Gerald Montgomery   MRN: [id number]    DOB: 02-Jul-1955   Date:06/17/2016       Progress Note  Subjective  Chief Complaint  Chief Complaint  Patient presents with  . Follow-up    4 mo    HPI  Pt. Presents for regular follow up, patient has history of Cerebral Palsy, urinary incontinence, history of DVT in legs, he is mainly confined to bed at home. His family would like to change home health agency that currently provides services for him at home.    Past Medical History:  Diagnosis Date  . Depression   . History of DVT of lower extremity   . Mood disorder ([place])   . Neurogenic bladder   . Urinary incontinence     Past Surgical History:  Procedure Laterality Date  . filter surgery for blood clot  2013  . HIP FRACTURE SURGERY  2013    Family History  Problem Relation Age of Onset  . Heart disease Mother   . Atrial fibrillation Mother   . Diabetes Mother     Borderline  . Heart disease Father     Passed away of heart attack  . Kidney disease Neg Hx   . Prostate cancer Neg Hx     Social History   Social History  . Marital status: Single    Spouse name: N/A  . Number of children: N/A  . Years of education: N/A   Occupational History  . Not on file.   Social History Main Topics  . Smoking status: Never Smoker  . Smokeless tobacco: Never Used  . Alcohol use No  . Drug use: No  . Sexual activity: No   Other Topics Concern  . Not on file   Social History Narrative  . No narrative on file     Current Outpatient Prescriptions:  .  ARIPiprazole (ABILIFY) 15 MG tablet, TAKE 1 TABLET(15 MG) BY MOUTH DAILY, Disp: 90 tablet, Rfl: 0 .  bethanechol (URECHOLINE) 50 MG tablet, TAKE 1 TABLET(50 MG) BY MOUTH THREE TIMES DAILY, Disp: 90 tablet, Rfl: 0 .  warfarin (COUMADIN) 4 MG tablet, TAKE 1 TABLET BY MOUTH ONE DAY PER WEEK OR AS DIRECTED., Disp: 15 tablet, Rfl: 0 .  warfarin (COUMADIN) 5 MG tablet, TAKE 1 TABLET(5 MG) BY MOUTH DAILY, Disp: 30 tablet, Rfl:  11  Allergies  Allergen Reactions  . Penicillins     Sickness/sweating  . Sulfa Antibiotics Itching     ROS  Refer to history of present illness for complete discussion of ROS  Objective  Vitals:   06/17/16 1021  BP: 120/66  Pulse: 92  Resp: 17  Temp: 97.7 F (36.5 C)  TempSrc: Oral  SpO2: 97%    Physical Exam  Constitutional: He is well-developed, well-nourished, and in no distress. Vital signs are normal.  Well appearing male in no distress, sitting on wheelchair, cooperative with exam.  Cardiovascular: Normal rate, regular rhythm, S1 normal, S2 normal and normal heart sounds.   No murmur heard. Pulmonary/Chest: Effort normal and breath sounds normal. No respiratory distress. He has no decreased breath sounds. He has no rhonchi.  Abdominal: Soft. Bowel sounds are normal. There is no tenderness.  Musculoskeletal:  Lower extremities with contractures, resists attempts to move his legs.      Assessment & Plan  1. Need for home health care Referral to obtain home health aide from a different agency is authorized. - Ambulatory referral to Home Health  2. Mood disorder (  [place]) Stable, continue on medications as prescribed  3. Neurogenic bladder  - ARIPiprazole (ABILIFY) 15 MG tablet; TAKE 1 TABLET(15 MG) BY MOUTH DAILY  Dispense: 90 tablet; Refill: 0   Kristine Chahal [facility] 06/17/2016 10:43 AM

## 2016-06-18 ENCOUNTER — Other Ambulatory Visit: Payer: Self-pay | Admitting: Family Medicine

## 2016-06-18 DIAGNOSIS — R296 Repeated falls: Secondary | ICD-10-CM

## 2016-06-20 DIAGNOSIS — G808 Other cerebral palsy: Secondary | ICD-10-CM | POA: Diagnosis not present

## 2016-06-20 DIAGNOSIS — F329 Major depressive disorder, single episode, unspecified: Secondary | ICD-10-CM | POA: Diagnosis not present

## 2016-06-20 DIAGNOSIS — N39498 Other specified urinary incontinence: Secondary | ICD-10-CM | POA: Diagnosis not present

## 2016-06-20 DIAGNOSIS — Z7901 Long term (current) use of anticoagulants: Secondary | ICD-10-CM | POA: Diagnosis not present

## 2016-06-20 DIAGNOSIS — N319 Neuromuscular dysfunction of bladder, unspecified: Secondary | ICD-10-CM | POA: Diagnosis not present

## 2016-06-20 DIAGNOSIS — Z86718 Personal history of other venous thrombosis and embolism: Secondary | ICD-10-CM | POA: Diagnosis not present

## 2016-06-24 ENCOUNTER — Telehealth: Payer: Self-pay | Admitting: Family Medicine

## 2016-06-24 DIAGNOSIS — Z7901 Long term (current) use of anticoagulants: Secondary | ICD-10-CM | POA: Diagnosis not present

## 2016-06-24 DIAGNOSIS — N319 Neuromuscular dysfunction of bladder, unspecified: Secondary | ICD-10-CM | POA: Diagnosis not present

## 2016-06-24 DIAGNOSIS — F329 Major depressive disorder, single episode, unspecified: Secondary | ICD-10-CM | POA: Diagnosis not present

## 2016-06-24 DIAGNOSIS — G808 Other cerebral palsy: Secondary | ICD-10-CM | POA: Diagnosis not present

## 2016-06-24 DIAGNOSIS — N39498 Other specified urinary incontinence: Secondary | ICD-10-CM | POA: Diagnosis not present

## 2016-06-24 DIAGNOSIS — Z86718 Personal history of other venous thrombosis and embolism: Secondary | ICD-10-CM | POA: Diagnosis not present

## 2016-06-24 NOTE — Telephone Encounter (Signed)
Gerald Montgomery from [facility] states patient was referred to them for nursing and PT. PT will be providing services however nursing will not. States that the patient does not need nursing and does not want it just need personal care services. A social working will be seeing patient today and they will help patient get the PCA services. Gerald Montgomery can be reached at [phone] if you have any questions

## 2016-06-25 DIAGNOSIS — N39498 Other specified urinary incontinence: Secondary | ICD-10-CM | POA: Diagnosis not present

## 2016-06-25 DIAGNOSIS — Z86718 Personal history of other venous thrombosis and embolism: Secondary | ICD-10-CM | POA: Diagnosis not present

## 2016-06-25 DIAGNOSIS — Z7901 Long term (current) use of anticoagulants: Secondary | ICD-10-CM | POA: Diagnosis not present

## 2016-06-25 DIAGNOSIS — N319 Neuromuscular dysfunction of bladder, unspecified: Secondary | ICD-10-CM | POA: Diagnosis not present

## 2016-06-25 DIAGNOSIS — G808 Other cerebral palsy: Secondary | ICD-10-CM | POA: Diagnosis not present

## 2016-06-25 DIAGNOSIS — F329 Major depressive disorder, single episode, unspecified: Secondary | ICD-10-CM | POA: Diagnosis not present

## 2016-06-25 NOTE — Telephone Encounter (Signed)
Referral was to change the in-home health provider for personal care services including dressing, bathing, grooming, and transferring from bed to chair. These were specifically written in my order and no skilled nursing was ever requested.

## 2016-06-25 NOTE — Telephone Encounter (Signed)
Routed note to Dr. Manuella Ghazi : [name] from [place] states patient was referred to them for nursing and PT. PT will be providing services however nursing will not. States that the patient does not need nursing and does not want it just need personal care services. A social working will be seeing patient today and they will help patient get the PCA services. [name] can be reached at [phone] if you have any questions

## 2016-06-26 ENCOUNTER — Telehealth: Payer: Self-pay | Admitting: Family Medicine

## 2016-06-26 NOTE — Telephone Encounter (Signed)
Gerald Montgomery from [facility] (PT) states patient is sick today. Wife called and asked him not to come. Elta Guadeloupe is requesting a change frequency for PT from 0 times this week due to patient being sick and will resume next week on regular schedule  [phone]

## 2016-06-29 ENCOUNTER — Telehealth: Payer: Self-pay | Admitting: Family Medicine

## 2016-06-29 NOTE — Telephone Encounter (Signed)
Mom states that she is needing someone to come out and do [place] PT/INR levels. [facility] was not able to do it due to her being a part of [place]. She stopped using [facility] but need to know how can she get [facility] to start coming out checking [place] levels. 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

## 2016-06-29 NOTE — Telephone Encounter (Signed)
Agree with above 

## 2016-06-29 NOTE — Telephone Encounter (Signed)
Left voice message informing Elta Guadeloupe

## 2016-06-30 DIAGNOSIS — N319 Neuromuscular dysfunction of bladder, unspecified: Secondary | ICD-10-CM | POA: Diagnosis not present

## 2016-06-30 DIAGNOSIS — Z7901 Long term (current) use of anticoagulants: Secondary | ICD-10-CM | POA: Diagnosis not present

## 2016-06-30 DIAGNOSIS — G808 Other cerebral palsy: Secondary | ICD-10-CM | POA: Diagnosis not present

## 2016-06-30 DIAGNOSIS — Z86718 Personal history of other venous thrombosis and embolism: Secondary | ICD-10-CM | POA: Diagnosis not present

## 2016-06-30 DIAGNOSIS — N39498 Other specified urinary incontinence: Secondary | ICD-10-CM | POA: Diagnosis not present

## 2016-06-30 DIAGNOSIS — F329 Major depressive disorder, single episode, unspecified: Secondary | ICD-10-CM | POA: Diagnosis not present

## 2016-07-01 DIAGNOSIS — G808 Other cerebral palsy: Secondary | ICD-10-CM | POA: Diagnosis not present

## 2016-07-01 DIAGNOSIS — N319 Neuromuscular dysfunction of bladder, unspecified: Secondary | ICD-10-CM | POA: Diagnosis not present

## 2016-07-01 DIAGNOSIS — Z7901 Long term (current) use of anticoagulants: Secondary | ICD-10-CM | POA: Diagnosis not present

## 2016-07-01 DIAGNOSIS — Z86718 Personal history of other venous thrombosis and embolism: Secondary | ICD-10-CM | POA: Diagnosis not present

## 2016-07-01 DIAGNOSIS — F329 Major depressive disorder, single episode, unspecified: Secondary | ICD-10-CM | POA: Diagnosis not present

## 2016-07-01 DIAGNOSIS — N39498 Other specified urinary incontinence: Secondary | ICD-10-CM | POA: Diagnosis not present

## 2016-07-02 DIAGNOSIS — Z86718 Personal history of other venous thrombosis and embolism: Secondary | ICD-10-CM | POA: Diagnosis not present

## 2016-07-02 DIAGNOSIS — N39498 Other specified urinary incontinence: Secondary | ICD-10-CM | POA: Diagnosis not present

## 2016-07-02 DIAGNOSIS — N319 Neuromuscular dysfunction of bladder, unspecified: Secondary | ICD-10-CM | POA: Diagnosis not present

## 2016-07-02 DIAGNOSIS — F329 Major depressive disorder, single episode, unspecified: Secondary | ICD-10-CM | POA: Diagnosis not present

## 2016-07-02 DIAGNOSIS — G808 Other cerebral palsy: Secondary | ICD-10-CM | POA: Diagnosis not present

## 2016-07-02 DIAGNOSIS — Z7901 Long term (current) use of anticoagulants: Secondary | ICD-10-CM | POA: Diagnosis not present

## 2016-07-06 ENCOUNTER — Telehealth: Payer: Self-pay | Admitting: Family Medicine

## 2016-07-06 ENCOUNTER — Encounter: Payer: Self-pay | Admitting: Family Medicine

## 2016-07-06 DIAGNOSIS — Z86718 Personal history of other venous thrombosis and embolism: Secondary | ICD-10-CM | POA: Diagnosis not present

## 2016-07-06 DIAGNOSIS — G808 Other cerebral palsy: Secondary | ICD-10-CM | POA: Diagnosis not present

## 2016-07-06 DIAGNOSIS — N319 Neuromuscular dysfunction of bladder, unspecified: Secondary | ICD-10-CM | POA: Diagnosis not present

## 2016-07-06 DIAGNOSIS — Z7901 Long term (current) use of anticoagulants: Secondary | ICD-10-CM | POA: Diagnosis not present

## 2016-07-06 DIAGNOSIS — N39498 Other specified urinary incontinence: Secondary | ICD-10-CM | POA: Diagnosis not present

## 2016-07-06 DIAGNOSIS — F329 Major depressive disorder, single episode, unspecified: Secondary | ICD-10-CM | POA: Diagnosis not present

## 2016-07-06 NOTE — Telephone Encounter (Signed)
Yves Dill from [facility] states pt has not had PT/INR since January (2nd week) from [place]. Mom is wanting someone to come for nursing to check [phone]

## 2016-07-08 DIAGNOSIS — Z7901 Long term (current) use of anticoagulants: Secondary | ICD-10-CM | POA: Diagnosis not present

## 2016-07-08 DIAGNOSIS — F329 Major depressive disorder, single episode, unspecified: Secondary | ICD-10-CM | POA: Diagnosis not present

## 2016-07-08 DIAGNOSIS — G808 Other cerebral palsy: Secondary | ICD-10-CM | POA: Diagnosis not present

## 2016-07-08 DIAGNOSIS — N39498 Other specified urinary incontinence: Secondary | ICD-10-CM | POA: Diagnosis not present

## 2016-07-08 DIAGNOSIS — Z86718 Personal history of other venous thrombosis and embolism: Secondary | ICD-10-CM | POA: Diagnosis not present

## 2016-07-08 DIAGNOSIS — N319 Neuromuscular dysfunction of bladder, unspecified: Secondary | ICD-10-CM | POA: Diagnosis not present

## 2016-07-09 ENCOUNTER — Telehealth: Payer: Self-pay | Admitting: Family Medicine

## 2016-07-09 NOTE — Telephone Encounter (Signed)
Spoke with patient's mom who reports that patient is overall doing well, sometimes he feels overly cold and that's when she thinks that his INR needs to be checked. Reviewed his chart and his last INR was 2.8 on 06/16/2016. Patient has an appointment scheduled next Thursday the 22nd to discuss changing over from warfarin to a novel anticoagulant. We'll recheck INR during his office visit. Mother was reassured

## 2016-07-09 NOTE — Telephone Encounter (Signed)
Spoke to [place] at [employer]. Will order nursing for PT check

## 2016-07-09 NOTE — Telephone Encounter (Signed)
Mom requesting that we get someone to come out and check his warfine levels. Have not been checked within the last month or so. [facility] comes out but they do not do that. Please call Bonnita Nasuti (mom) at [phone] or his sister Mickel Baas [phone]

## 2016-07-09 NOTE — Telephone Encounter (Signed)
Spoke to [place] at [employer]. Patient do not have nursing. Will go out on 07/10/2016 and check PT and call office back with results

## 2016-07-10 ENCOUNTER — Telehealth: Payer: Self-pay

## 2016-07-10 DIAGNOSIS — G808 Other cerebral palsy: Secondary | ICD-10-CM | POA: Diagnosis not present

## 2016-07-10 DIAGNOSIS — F329 Major depressive disorder, single episode, unspecified: Secondary | ICD-10-CM | POA: Diagnosis not present

## 2016-07-10 DIAGNOSIS — Z7901 Long term (current) use of anticoagulants: Secondary | ICD-10-CM | POA: Diagnosis not present

## 2016-07-10 DIAGNOSIS — N39498 Other specified urinary incontinence: Secondary | ICD-10-CM | POA: Diagnosis not present

## 2016-07-10 DIAGNOSIS — Z86718 Personal history of other venous thrombosis and embolism: Secondary | ICD-10-CM | POA: Diagnosis not present

## 2016-07-10 DIAGNOSIS — N319 Neuromuscular dysfunction of bladder, unspecified: Secondary | ICD-10-CM | POA: Diagnosis not present

## 2016-07-10 NOTE — Telephone Encounter (Signed)
INR is therapeutic, advised to continue Coumadin until patient's office visit appointment with me next week. She verbalizes agreement.

## 2016-07-10 NOTE — Telephone Encounter (Signed)
Routed note to Dr. Manuella Ghazi, results from [facility] PT: 34.1 INR: 2.6 Pt is currently taking 5 mg coumadin daily [name] is asking for return call concerning medication change from coumadin to Xarelto please return call to [facility] at [phone]

## 2016-07-13 DIAGNOSIS — F329 Major depressive disorder, single episode, unspecified: Secondary | ICD-10-CM | POA: Diagnosis not present

## 2016-07-13 DIAGNOSIS — Z7901 Long term (current) use of anticoagulants: Secondary | ICD-10-CM | POA: Diagnosis not present

## 2016-07-13 DIAGNOSIS — N39498 Other specified urinary incontinence: Secondary | ICD-10-CM | POA: Diagnosis not present

## 2016-07-13 DIAGNOSIS — G808 Other cerebral palsy: Secondary | ICD-10-CM | POA: Diagnosis not present

## 2016-07-13 DIAGNOSIS — Z86718 Personal history of other venous thrombosis and embolism: Secondary | ICD-10-CM | POA: Diagnosis not present

## 2016-07-13 DIAGNOSIS — N319 Neuromuscular dysfunction of bladder, unspecified: Secondary | ICD-10-CM | POA: Diagnosis not present

## 2016-07-14 DIAGNOSIS — Z7901 Long term (current) use of anticoagulants: Secondary | ICD-10-CM | POA: Diagnosis not present

## 2016-07-14 DIAGNOSIS — Z86718 Personal history of other venous thrombosis and embolism: Secondary | ICD-10-CM | POA: Diagnosis not present

## 2016-07-14 DIAGNOSIS — G808 Other cerebral palsy: Secondary | ICD-10-CM | POA: Diagnosis not present

## 2016-07-14 DIAGNOSIS — N39498 Other specified urinary incontinence: Secondary | ICD-10-CM | POA: Diagnosis not present

## 2016-07-14 DIAGNOSIS — F329 Major depressive disorder, single episode, unspecified: Secondary | ICD-10-CM | POA: Diagnosis not present

## 2016-07-14 DIAGNOSIS — N319 Neuromuscular dysfunction of bladder, unspecified: Secondary | ICD-10-CM | POA: Diagnosis not present

## 2016-07-15 DIAGNOSIS — Z86718 Personal history of other venous thrombosis and embolism: Secondary | ICD-10-CM | POA: Diagnosis not present

## 2016-07-15 DIAGNOSIS — F329 Major depressive disorder, single episode, unspecified: Secondary | ICD-10-CM | POA: Diagnosis not present

## 2016-07-15 DIAGNOSIS — N39498 Other specified urinary incontinence: Secondary | ICD-10-CM | POA: Diagnosis not present

## 2016-07-15 DIAGNOSIS — G808 Other cerebral palsy: Secondary | ICD-10-CM | POA: Diagnosis not present

## 2016-07-15 DIAGNOSIS — Z7901 Long term (current) use of anticoagulants: Secondary | ICD-10-CM | POA: Diagnosis not present

## 2016-07-15 DIAGNOSIS — N319 Neuromuscular dysfunction of bladder, unspecified: Secondary | ICD-10-CM | POA: Diagnosis not present

## 2016-07-16 ENCOUNTER — Ambulatory Visit (INDEPENDENT_AMBULATORY_CARE_PROVIDER_SITE_OTHER): Payer: Medicare Other | Admitting: Family Medicine

## 2016-07-16 ENCOUNTER — Other Ambulatory Visit: Payer: Self-pay | Admitting: Family Medicine

## 2016-07-16 ENCOUNTER — Encounter: Payer: Self-pay | Admitting: Family Medicine

## 2016-07-16 VITALS — BP 120/60 | HR 97 | Temp 98.2°F | Resp 17

## 2016-07-16 DIAGNOSIS — Z86718 Personal history of other venous thrombosis and embolism: Secondary | ICD-10-CM | POA: Diagnosis not present

## 2016-07-16 DIAGNOSIS — R8290 Unspecified abnormal findings in urine: Secondary | ICD-10-CM | POA: Insufficient documentation

## 2016-07-16 LAB — POCT INR: INR: 5.8

## 2016-07-16 MED ORDER — WARFARIN SODIUM 5 MG PO TABS
ORAL_TABLET | ORAL | 0 refills | Status: DC
Start: 2016-07-16 — End: 2016-07-28

## 2016-07-16 NOTE — Progress Notes (Signed)
Name: Gerald Montgomery   MRN: [id number]    DOB: Nov 29, 1955   Date:07/16/2016       Progress Note  Subjective  Chief Complaint  Chief Complaint  Patient presents with  . 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 He had his IVC filter placed which was subsequently removed. Patient gets monthly INR checks at home by his home health agency, he reports no episodes of bleeding. We have discussed changing over from Coumadin to one of the NOACs. In addition, patient's mother reports a strong urine odor and wishes to check for UTI  Past Medical History:  Diagnosis Date  . Depression   . History of DVT of lower extremity   . Mood disorder ([place])   . Neurogenic bladder   . Urinary incontinence     Past Surgical History:  Procedure Laterality Date  . filter surgery for blood clot  2013  . HIP FRACTURE SURGERY  2013    Family History  Problem Relation Age of Onset  . Heart disease Mother   . Atrial fibrillation Mother   . Diabetes Mother     Borderline  . Heart disease Father     Passed away of heart attack  . Kidney disease Neg Hx   . Prostate cancer Neg Hx     Social History   Social History  . Marital status: Single    Spouse name: N/A  . Number of children: N/A  . Years of education: N/A   Occupational History  . Not on file.   Social History Main Topics  . Smoking status: Never Smoker  . Smokeless tobacco: Never Used  . Alcohol use No  . Drug use: No  . Sexual activity: No   Other Topics Concern  . Not on file   Social History Narrative  . No narrative on file     Current Outpatient Prescriptions:  .  ARIPiprazole (ABILIFY) 15 MG tablet, TAKE 1 TABLET(15 MG) BY MOUTH DAILY, Disp: 90 tablet, Rfl: 0 .  bethanechol (URECHOLINE) 50 MG tablet, TAKE 1 TABLET(50 MG) BY MOUTH THREE TIMES DAILY, Disp: 90 tablet, Rfl: 0 .   warfarin (COUMADIN) 5 MG tablet, TAKE 1 TABLET(5 MG) BY MOUTH DAILY, Disp: 30 tablet, Rfl: 11  Allergies  Allergen Reactions  . Penicillins     Sickness/sweating  . Sulfa Antibiotics Itching     ROS  Please see history of present illness for complete discussion of ROS  Objective  Vitals:   07/16/16 1015  BP: 120/60  Pulse: 97  Resp: 17  Temp: 98.2 F (36.8 C)  TempSrc: Oral  SpO2: 98%    Physical Exam  Constitutional: He is well-developed, well-nourished, and in no distress.  HENT:  Head: Normocephalic and atraumatic.  Cardiovascular: Normal rate, regular rhythm and normal heart sounds.   No murmur heard. Pulmonary/Chest: Effort normal and breath sounds normal. He has no wheezes.  Abdominal: Soft. Bowel sounds are normal.  Neurological: He is alert.  Psychiatric: Mood, memory, affect and judgment normal.  Nursing note and vitals reviewed.       Assessment & Plan  1. History of recurrent deep vein thrombosis (DVT) INR is supratherapeutic at 5.8, therefore will not be making the change from Coumadin to NOAC at this visit. Advised to hold off on Coumadin for next 4  evenings, recheck INR on Monday, 07/20/2014. Patient will return in 10 days to repeat INR and transition over to NOAC - POCT INR - warfarin (COUMADIN) 5 MG tablet; TAKE 1 TABLET(5 MG) BY MOUTH DAILY  Dispense: 10 tablet; Refill: 0  2. Abnormal urine odor He will return with urine sample. - Urinalysis, Routine w reflex microscopic - Urine Culture  Janiaya Ryser [facility] 07/16/2016 10:23 AM

## 2016-07-17 ENCOUNTER — Ambulatory Visit: Payer: Self-pay | Admitting: Family Medicine

## 2016-07-17 DIAGNOSIS — N319 Neuromuscular dysfunction of bladder, unspecified: Secondary | ICD-10-CM | POA: Diagnosis not present

## 2016-07-17 DIAGNOSIS — Z86718 Personal history of other venous thrombosis and embolism: Secondary | ICD-10-CM | POA: Diagnosis not present

## 2016-07-17 DIAGNOSIS — G808 Other cerebral palsy: Secondary | ICD-10-CM | POA: Diagnosis not present

## 2016-07-17 DIAGNOSIS — F329 Major depressive disorder, single episode, unspecified: Secondary | ICD-10-CM | POA: Diagnosis not present

## 2016-07-17 DIAGNOSIS — N39498 Other specified urinary incontinence: Secondary | ICD-10-CM | POA: Diagnosis not present

## 2016-07-17 DIAGNOSIS — Z7901 Long term (current) use of anticoagulants: Secondary | ICD-10-CM | POA: Diagnosis not present

## 2016-07-17 DIAGNOSIS — R8290 Unspecified abnormal findings in urine: Secondary | ICD-10-CM | POA: Diagnosis not present

## 2016-07-18 LAB — URINALYSIS, ROUTINE W REFLEX MICROSCOPIC
Bilirubin Urine: NEGATIVE
Glucose, UA: NEGATIVE
Hgb urine dipstick: NEGATIVE
NITRITE: NEGATIVE
PH: 7.5 (ref 5.0–8.0)
SPECIFIC GRAVITY, URINE: 1.02 (ref 1.001–1.035)

## 2016-07-18 LAB — URINALYSIS, MICROSCOPIC ONLY
Casts: NONE SEEN [LPF]
Yeast: NONE SEEN [HPF]

## 2016-07-18 LAB — URINE CULTURE

## 2016-07-20 ENCOUNTER — Telehealth: Payer: Self-pay | Admitting: Family Medicine

## 2016-07-20 DIAGNOSIS — G808 Other cerebral palsy: Secondary | ICD-10-CM | POA: Diagnosis not present

## 2016-07-20 DIAGNOSIS — N319 Neuromuscular dysfunction of bladder, unspecified: Secondary | ICD-10-CM | POA: Diagnosis not present

## 2016-07-20 DIAGNOSIS — Z7901 Long term (current) use of anticoagulants: Secondary | ICD-10-CM | POA: Diagnosis not present

## 2016-07-20 DIAGNOSIS — Z86718 Personal history of other venous thrombosis and embolism: Secondary | ICD-10-CM | POA: Diagnosis not present

## 2016-07-20 DIAGNOSIS — F329 Major depressive disorder, single episode, unspecified: Secondary | ICD-10-CM | POA: Diagnosis not present

## 2016-07-20 DIAGNOSIS — N39498 Other specified urinary incontinence: Secondary | ICD-10-CM | POA: Diagnosis not present

## 2016-07-20 NOTE — Telephone Encounter (Signed)
Gerald Montgomery from [facility] calling to give results PT: 18.5  INR: 1.5 Coumodin been on hold all weekend because Dr Manuella Ghazi is trying to place him on xarelto. [name] states this is her last vist with the patient. (P) [phone]

## 2016-07-20 NOTE — Telephone Encounter (Signed)
Spoke to [place] and she stated a nurse had showed up to perform PT

## 2016-07-20 NOTE — Telephone Encounter (Signed)
Pt mom states someone was suppose to come out and read [place] PT/INR (warfin levels). So far no one has showed up and is asking what should she do. She states you took him of warfin [phone]

## 2016-07-21 DIAGNOSIS — G808 Other cerebral palsy: Secondary | ICD-10-CM | POA: Diagnosis not present

## 2016-07-21 DIAGNOSIS — Z7901 Long term (current) use of anticoagulants: Secondary | ICD-10-CM | POA: Diagnosis not present

## 2016-07-21 DIAGNOSIS — Z86718 Personal history of other venous thrombosis and embolism: Secondary | ICD-10-CM | POA: Diagnosis not present

## 2016-07-21 DIAGNOSIS — F329 Major depressive disorder, single episode, unspecified: Secondary | ICD-10-CM | POA: Diagnosis not present

## 2016-07-21 DIAGNOSIS — N39498 Other specified urinary incontinence: Secondary | ICD-10-CM | POA: Diagnosis not present

## 2016-07-21 DIAGNOSIS — N319 Neuromuscular dysfunction of bladder, unspecified: Secondary | ICD-10-CM | POA: Diagnosis not present

## 2016-07-21 NOTE — Telephone Encounter (Signed)
Advised patient's mom to restart him on Coumadin 5 mg every day and will recheck INR at the time of his office visit appointment on March 5th.

## 2016-07-21 NOTE — Telephone Encounter (Signed)
Mom is checking status to see if you would like for her to start [place] back on warfin. Please advise

## 2016-07-27 ENCOUNTER — Other Ambulatory Visit: Payer: Self-pay | Admitting: Family Medicine

## 2016-07-27 ENCOUNTER — Ambulatory Visit: Payer: Self-pay | Admitting: Family Medicine

## 2016-07-28 ENCOUNTER — Telehealth: Payer: Self-pay | Admitting: Family Medicine

## 2016-07-28 DIAGNOSIS — Z86718 Personal history of other venous thrombosis and embolism: Secondary | ICD-10-CM

## 2016-07-28 MED ORDER — WARFARIN SODIUM 5 MG PO TABS
ORAL_TABLET | ORAL | 0 refills | Status: DC
Start: 2016-07-28 — End: 2016-08-27

## 2016-07-28 NOTE — Telephone Encounter (Signed)
Spoke with patient's mother and advised that Coumadin 5 mg every evening should be continued until his appointment on Friday, 07/31/2016. We'll check INR at that time. Prescription for Coumadin 5 mg is sent to his pharmacy. She verbalizes agreement.

## 2016-07-28 NOTE — Telephone Encounter (Signed)
Pt had appointment for yesterday but it was cancelled due to mom getting sick with vertigo. The appointment is rescheduled for this coming Friday. [name] (sister) states that he only have one warfin left and would like to know if you want him to stay off until appointment or if you want to call it in to [place] (sister) [phone]

## 2016-07-31 ENCOUNTER — Ambulatory Visit (INDEPENDENT_AMBULATORY_CARE_PROVIDER_SITE_OTHER): Payer: Medicare Other | Admitting: Family Medicine

## 2016-07-31 ENCOUNTER — Encounter: Payer: Self-pay | Admitting: Family Medicine

## 2016-07-31 VITALS — BP 120/72 | HR 71 | Temp 98.1°F | Resp 17

## 2016-07-31 DIAGNOSIS — Z7901 Long term (current) use of anticoagulants: Secondary | ICD-10-CM

## 2016-07-31 DIAGNOSIS — Z5181 Encounter for therapeutic drug level monitoring: Secondary | ICD-10-CM

## 2016-07-31 LAB — POCT INR: INR: 3.9

## 2016-07-31 NOTE — Progress Notes (Signed)
This encounter was created in error - please disregard.

## 2016-08-04 ENCOUNTER — Telehealth: Payer: Self-pay | Admitting: Family Medicine

## 2016-08-04 DIAGNOSIS — Z86718 Personal history of other venous thrombosis and embolism: Secondary | ICD-10-CM | POA: Diagnosis not present

## 2016-08-04 DIAGNOSIS — L89112 Pressure ulcer of right upper back, stage 2: Secondary | ICD-10-CM | POA: Diagnosis not present

## 2016-08-04 DIAGNOSIS — N319 Neuromuscular dysfunction of bladder, unspecified: Secondary | ICD-10-CM | POA: Diagnosis not present

## 2016-08-04 DIAGNOSIS — F329 Major depressive disorder, single episode, unspecified: Secondary | ICD-10-CM | POA: Diagnosis not present

## 2016-08-04 DIAGNOSIS — G808 Other cerebral palsy: Secondary | ICD-10-CM | POA: Diagnosis not present

## 2016-08-04 DIAGNOSIS — Z7901 Long term (current) use of anticoagulants: Secondary | ICD-10-CM | POA: Diagnosis not present

## 2016-08-04 NOTE — Telephone Encounter (Signed)
Spoke with [name] and advised to restart patient back on Coumadin 5 mg, recheck INR on Friday, August 07, 2016.

## 2016-08-04 NOTE — Telephone Encounter (Signed)
[name] with [facility] giving results INR: 1.8 PT: 21.5 Mom want to know what dosage does he need to be on for warfin? It has been on hold for 1 week. [phone]

## 2016-08-07 ENCOUNTER — Telehealth: Payer: Self-pay | Admitting: Family Medicine

## 2016-08-07 DIAGNOSIS — N319 Neuromuscular dysfunction of bladder, unspecified: Secondary | ICD-10-CM | POA: Diagnosis not present

## 2016-08-07 DIAGNOSIS — F329 Major depressive disorder, single episode, unspecified: Secondary | ICD-10-CM | POA: Diagnosis not present

## 2016-08-07 DIAGNOSIS — Z86718 Personal history of other venous thrombosis and embolism: Secondary | ICD-10-CM | POA: Diagnosis not present

## 2016-08-07 DIAGNOSIS — L89112 Pressure ulcer of right upper back, stage 2: Secondary | ICD-10-CM | POA: Diagnosis not present

## 2016-08-07 DIAGNOSIS — Z7901 Long term (current) use of anticoagulants: Secondary | ICD-10-CM | POA: Diagnosis not present

## 2016-08-07 DIAGNOSIS — G808 Other cerebral palsy: Secondary | ICD-10-CM | POA: Diagnosis not present

## 2016-08-07 NOTE — Telephone Encounter (Signed)
Gerald Montgomery from [facility] giving results PT: 29 INR: 2.4 Currently on 5mg  of warfin [phone]

## 2016-08-07 NOTE — Telephone Encounter (Signed)
Reviewed chart; note about switching him over to Xarelto, but then back on coumadin I called and left message with home health nurse Continue current regimen, 5 mg daily I will leave for Dr. Manuella Ghazi to review on Monday to give further orders As a default, I said to check INR in one week unless they get other instructions from Dr. Manuella Ghazi

## 2016-08-10 NOTE — Telephone Encounter (Signed)
Agree with checking INR on Friday, March 23rd, if its therapeutic, we will switch to Xarelto, please inform patient to schedule an appointment on Friday, 08/14/2016

## 2016-08-10 NOTE — Telephone Encounter (Signed)
Patient will have levels checked on Friday, March 23 in office and if therapeutic, will switch to Xarelto.

## 2016-08-10 NOTE — Telephone Encounter (Signed)
Gerald Montgomery was informed of what dr Manuella Ghazi said. I scheduled him an appointment to see Dr Manuella Ghazi for this Friday but was not certain if it was to see Manuella Ghazi or the nurse. Also, Gerald Montgomery stated that due to insurance she is not able to check his coumdin levels on the same day that Dr Manuella Ghazi does but he Dr Manuella Ghazi would like she can check it on Thursday. Please advise.

## 2016-08-13 ENCOUNTER — Telehealth: Payer: Self-pay | Admitting: Emergency Medicine

## 2016-08-13 DIAGNOSIS — G808 Other cerebral palsy: Secondary | ICD-10-CM | POA: Diagnosis not present

## 2016-08-13 DIAGNOSIS — N319 Neuromuscular dysfunction of bladder, unspecified: Secondary | ICD-10-CM | POA: Diagnosis not present

## 2016-08-13 DIAGNOSIS — Z7901 Long term (current) use of anticoagulants: Secondary | ICD-10-CM | POA: Diagnosis not present

## 2016-08-13 DIAGNOSIS — F329 Major depressive disorder, single episode, unspecified: Secondary | ICD-10-CM | POA: Diagnosis not present

## 2016-08-13 DIAGNOSIS — L89112 Pressure ulcer of right upper back, stage 2: Secondary | ICD-10-CM | POA: Diagnosis not present

## 2016-08-13 DIAGNOSIS — Z86718 Personal history of other venous thrombosis and embolism: Secondary | ICD-10-CM | POA: Diagnosis not present

## 2016-08-13 NOTE — Telephone Encounter (Signed)
[name] from [facility] care called and stated that [name] INR was 6.2. Per Dr. Manuella Ghazi who spoke to [name]  patient is to go to ER , hold coumadin for 4 days and recheck on Monday.

## 2016-08-14 ENCOUNTER — Ambulatory Visit: Payer: Medicare Other | Admitting: Family Medicine

## 2016-08-17 ENCOUNTER — Telehealth: Payer: Self-pay | Admitting: Family Medicine

## 2016-08-17 DIAGNOSIS — Z86718 Personal history of other venous thrombosis and embolism: Secondary | ICD-10-CM | POA: Diagnosis not present

## 2016-08-17 DIAGNOSIS — F329 Major depressive disorder, single episode, unspecified: Secondary | ICD-10-CM | POA: Diagnosis not present

## 2016-08-17 DIAGNOSIS — L89112 Pressure ulcer of right upper back, stage 2: Secondary | ICD-10-CM | POA: Diagnosis not present

## 2016-08-17 DIAGNOSIS — N319 Neuromuscular dysfunction of bladder, unspecified: Secondary | ICD-10-CM | POA: Diagnosis not present

## 2016-08-17 DIAGNOSIS — Z7901 Long term (current) use of anticoagulants: Secondary | ICD-10-CM | POA: Diagnosis not present

## 2016-08-17 DIAGNOSIS — G808 Other cerebral palsy: Secondary | ICD-10-CM | POA: Diagnosis not present

## 2016-08-17 NOTE — Telephone Encounter (Signed)
INR: 1.5 PT: 18.2 coumodin was held all weekend. Please return call [phone]

## 2016-08-18 NOTE — Telephone Encounter (Signed)
Spoke with Rea College from [facility] and informed her that Dr. Manuella Ghazi is out of the office today and she reported patient's INR; 1.5 and PT: 18.2  Coumadin was held all weekend and she was notified today to resume to 5 mg daily and recheck in 2 days per Dr. Ancil Boozer

## 2016-08-20 ENCOUNTER — Telehealth: Payer: Self-pay | Admitting: Emergency Medicine

## 2016-08-20 DIAGNOSIS — F329 Major depressive disorder, single episode, unspecified: Secondary | ICD-10-CM | POA: Diagnosis not present

## 2016-08-20 DIAGNOSIS — G808 Other cerebral palsy: Secondary | ICD-10-CM | POA: Diagnosis not present

## 2016-08-20 DIAGNOSIS — Z86718 Personal history of other venous thrombosis and embolism: Secondary | ICD-10-CM | POA: Diagnosis not present

## 2016-08-20 DIAGNOSIS — N319 Neuromuscular dysfunction of bladder, unspecified: Secondary | ICD-10-CM | POA: Diagnosis not present

## 2016-08-20 DIAGNOSIS — Z7901 Long term (current) use of anticoagulants: Secondary | ICD-10-CM | POA: Diagnosis not present

## 2016-08-20 DIAGNOSIS — L89112 Pressure ulcer of right upper back, stage 2: Secondary | ICD-10-CM | POA: Diagnosis not present

## 2016-08-20 NOTE — Telephone Encounter (Signed)
Lynann Bologna, RN for [facility] called regarding Mr. Rion PT/INR  PT 18. 1 INR 1.5  Taking 5 mg qd

## 2016-08-21 NOTE — Telephone Encounter (Signed)
Patient is to continue on Coumadin 5 mg daily and recheck INR on Monday, 08/24/2016, Sherry from [facility] has already been informed of the plan.

## 2016-08-24 ENCOUNTER — Telehealth: Payer: Self-pay

## 2016-08-24 DIAGNOSIS — G808 Other cerebral palsy: Secondary | ICD-10-CM | POA: Diagnosis not present

## 2016-08-24 DIAGNOSIS — Z86718 Personal history of other venous thrombosis and embolism: Secondary | ICD-10-CM | POA: Diagnosis not present

## 2016-08-24 DIAGNOSIS — N319 Neuromuscular dysfunction of bladder, unspecified: Secondary | ICD-10-CM | POA: Diagnosis not present

## 2016-08-24 DIAGNOSIS — F329 Major depressive disorder, single episode, unspecified: Secondary | ICD-10-CM | POA: Diagnosis not present

## 2016-08-24 DIAGNOSIS — Z7901 Long term (current) use of anticoagulants: Secondary | ICD-10-CM | POA: Diagnosis not present

## 2016-08-24 DIAGNOSIS — L89112 Pressure ulcer of right upper back, stage 2: Secondary | ICD-10-CM | POA: Diagnosis not present

## 2016-08-24 NOTE — Telephone Encounter (Signed)
[facility] called with patient results: INR 3.2 PT 38.8 with taking 5 mg daily of Warfarin and trying to change to Xarelto.  Please call [name] back at [phone]

## 2016-08-25 NOTE — Telephone Encounter (Signed)
Dr. Manuella Ghazi spoke to nurse at [facility] on 08/24/16. Nurse was notified to advise patient no coumadin on Tuesday and Wednesday and make appointment to come to office for visit.

## 2016-08-27 ENCOUNTER — Encounter: Payer: Self-pay | Admitting: Family Medicine

## 2016-08-27 ENCOUNTER — Ambulatory Visit (INDEPENDENT_AMBULATORY_CARE_PROVIDER_SITE_OTHER): Payer: Medicare Other | Admitting: Family Medicine

## 2016-08-27 VITALS — BP 124/78 | HR 84 | Temp 97.9°F | Resp 16

## 2016-08-27 DIAGNOSIS — Z86718 Personal history of other venous thrombosis and embolism: Secondary | ICD-10-CM

## 2016-08-27 DIAGNOSIS — L89111 Pressure ulcer of right upper back, stage 1: Secondary | ICD-10-CM

## 2016-08-27 LAB — COMPLETE METABOLIC PANEL WITH GFR
ALK PHOS: 93 U/L (ref 40–115)
ALT: 24 U/L (ref 9–46)
AST: 24 U/L (ref 10–35)
Albumin: 4.3 g/dL (ref 3.6–5.1)
BILIRUBIN TOTAL: 0.5 mg/dL (ref 0.2–1.2)
BUN: 21 mg/dL (ref 7–25)
CO2: 23 mmol/L (ref 20–31)
Calcium: 8.9 mg/dL (ref 8.6–10.3)
Chloride: 106 mmol/L (ref 98–110)
Creat: 0.55 mg/dL — ABNORMAL LOW (ref 0.70–1.33)
Glucose, Bld: 99 mg/dL (ref 65–99)
POTASSIUM: 3.9 mmol/L (ref 3.5–5.3)
SODIUM: 140 mmol/L (ref 135–146)
TOTAL PROTEIN: 6.6 g/dL (ref 6.1–8.1)

## 2016-08-27 LAB — POCT INR: INR: 2.2

## 2016-08-27 MED ORDER — RIVAROXABAN 10 MG PO TABS: 10.0000 mg | ORAL_TABLET | Freq: Every day | ORAL | 0 refills | Status: DC

## 2016-08-27 NOTE — Progress Notes (Signed)
Name: Gerald Montgomery   MRN: [id number]    DOB: 1956/04/07   Date:08/27/2016       Progress Note  Subjective  Chief Complaint  Chief Complaint  Patient presents with  . Follow-up    discuss coumadin changes    HPI  Patient presents for follow up on anticoagulation, has history of DVT of left lower extremity, has been on Warfarin 5 mg daily, INR today is therapeutic at 2.2, will be switched to Xarelto 10 mg daily.  Patient also has a reported stage II ulcer on his right upper back, has been managed by home health with  dressing changes.  Past Medical History:  Diagnosis Date  . Depression   . History of DVT of lower extremity   . Mood disorder ([place])   . Neurogenic bladder   . Urinary incontinence     Past Surgical History:  Procedure Laterality Date  . filter surgery for blood clot  2013  . HIP FRACTURE SURGERY  2013    Family History  Problem Relation Age of Onset  . Heart disease Mother   . Atrial fibrillation Mother   . Diabetes Mother     Borderline  . Heart disease Father     Passed away of heart attack  . Kidney disease Neg Hx   . Prostate cancer Neg Hx     Social History   Social History  . Marital status: Single    Spouse name: N/A  . Number of children: N/A  . Years of education: N/A   Occupational History  . Not on file.   Social History Main Topics  . Smoking status: Never Smoker  . Smokeless tobacco: Never Used  . Alcohol use No  . Drug use: No  . Sexual activity: No   Other Topics Concern  . Not on file   Social History Narrative  . No narrative on file     Current Outpatient Prescriptions:  .  ARIPiprazole (ABILIFY) 15 MG tablet, TAKE 1 TABLET(15 MG) BY MOUTH DAILY, Disp: 90 tablet, Rfl: 0 .  bethanechol (URECHOLINE) 50 MG tablet, TAKE 1 TABLET(50 MG) BY MOUTH THREE TIMES DAILY, Disp: 90 tablet, Rfl: 0 .  warfarin (COUMADIN) 5 MG tablet, TAKE 1 TABLET(5 MG) BY MOUTH DAILY, Disp: 10 tablet, Rfl: 0  Allergies  Allergen Reactions  .  Penicillins     Sickness/sweating  . Sulfa Antibiotics Itching     ROS  Please see history of present illness for complete discussion of ROS  Objective  Vitals:   08/27/16 1419  BP: 124/78  Pulse: 84  Resp: 16  Temp: 97.9 F (36.6 C)  TempSrc: Oral  SpO2: 98%    Physical Exam  Constitutional: He is well-developed, well-nourished, and in no distress.  HENT:  Head: Normocephalic and atraumatic.  Cardiovascular: Normal rate, regular rhythm and normal heart sounds.   No murmur heard. Pulmonary/Chest: Effort normal and breath sounds normal. He has no wheezes.  Abdominal: Soft. Bowel sounds are normal.  Musculoskeletal:       Back:  He is also on the right upper lateral back, now considered a stage I ulcer, mild erythema, no tenderness to palpation.  Neurological: He is alert.  Psychiatric: Mood, memory, affect and judgment normal.  Nursing note and vitals reviewed.      Recent Results (from the past 2160 hour(s))  POCT INR     Status: None   Collection Time: 07/16/16 10:43 AM  Result Value Ref Range   INR  5.8     Comment: PT 69.1  Urinalysis, Routine w reflex microscopic     Status: Abnormal   Collection Time: 07/16/16 11:09 AM  Result Value Ref Range   Color, Urine YELLOW YELLOW   APPearance CLOUDY (A) CLEAR   Specific Gravity, Urine 1.020 1.001 - 1.035   pH 7.5 5.0 - 8.0   Glucose, UA NEGATIVE NEGATIVE   Bilirubin Urine NEGATIVE NEGATIVE   Ketones, ur TRACE (A) NEGATIVE   Hgb urine dipstick NEGATIVE NEGATIVE   Protein, ur 2+ (A) NEGATIVE   Nitrite NEGATIVE NEGATIVE   Leukocytes, UA 2+ (A) NEGATIVE  Urine Microscopic     Status: Abnormal   Collection Time: 07/16/16 11:09 AM  Result Value Ref Range   WBC, UA 40-60 (A) <=5 WBC/HPF   RBC / HPF 0-2 <=2 RBC/HPF   Squamous Epithelial / LPF 0-5 <=5 HPF   Bacteria, UA MANY (A) NONE SEEN HPF   Crystals See Below (A) NONE SEEN HPF    Comment: Triple Phosphate Crystals            FEW       ABN   NONE SEEN      HPF Amorphous Sediment                   MODERATE  ABN   NONE SEEN     HPF    Casts NONE SEEN NONE SEEN LPF   Yeast NONE SEEN NONE SEEN HPF  Urine Culture     Status: None   Collection Time: 07/17/16  8:59 AM  Result Value Ref Range   Organism ID, Bacteria      Multiple organisms present,each less than 10,000 CFU/mL. These organisms,commonly found on external and internal genitalia,are considered colonizers. No further testing performed.   POCT INR     Status: Abnormal   Collection Time: 07/31/16 11:12 AM  Result Value Ref Range   INR 3.9     Comment: no coumadin FRI, Sat, Sun [facility] to check on Monday  POCT INR     Status: None   Collection Time: 08/27/16  3:15 PM  Result Value Ref Range   INR 2.2     Comment: PT 26.7     Assessment & Plan  1. History of DVT of lower extremity Point-of-care INR is 2.2 considered therapeutic. We will DC Coumadin and start on Zetia well to 10 mg daily for DVT prophylaxis. Advised on possible side effects including bleeding.  - POCT INR - rivaroxaban (XARELTO) 10 MG TABS tablet; Take 1 tablet (10 mg total) by mouth daily.  Dispense: 90 tablet; Refill: 0 - COMPLETE METABOLIC PANEL WITH GFR  2. 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 08/27/2016 3:29 PM

## 2016-09-09 DIAGNOSIS — F329 Major depressive disorder, single episode, unspecified: Secondary | ICD-10-CM | POA: Diagnosis not present

## 2016-09-09 DIAGNOSIS — G808 Other cerebral palsy: Secondary | ICD-10-CM | POA: Diagnosis not present

## 2016-09-09 DIAGNOSIS — Z86718 Personal history of other venous thrombosis and embolism: Secondary | ICD-10-CM | POA: Diagnosis not present

## 2016-09-09 DIAGNOSIS — Z7901 Long term (current) use of anticoagulants: Secondary | ICD-10-CM | POA: Diagnosis not present

## 2016-09-09 DIAGNOSIS — N319 Neuromuscular dysfunction of bladder, unspecified: Secondary | ICD-10-CM | POA: Diagnosis not present

## 2016-09-09 DIAGNOSIS — L89112 Pressure ulcer of right upper back, stage 2: Secondary | ICD-10-CM | POA: Diagnosis not present

## 2016-10-12 ENCOUNTER — Other Ambulatory Visit: Payer: Self-pay | Admitting: Family Medicine

## 2016-10-13 ENCOUNTER — Telehealth: Payer: Self-pay | Admitting: Family Medicine

## 2016-10-13 NOTE — Telephone Encounter (Signed)
Mickel Baas (mom) states pt urine is really dark (like grape jelly), is not really eating. Would like an antibiotic sent to [facility]. She realizes that you may want her to bring him in but it will take her 3 days due to transportation.

## 2016-10-13 NOTE — Telephone Encounter (Signed)
Mickel Baas (mom) states pt urine is really dark (like grape jelly), is not really eating. Would like an antibiotic sent to [facility]. She realizes that you may want her to bring him in but it will take her 3 days due to transportation.

## 2016-10-13 NOTE — Telephone Encounter (Signed)
I cannot start an antibiotic until an infection is confirmed with lab testing, please recommend patient's mother to bring him in as soon as possible and we will evaluate patient's symptoms

## 2016-10-14 ENCOUNTER — Telehealth: Payer: Self-pay | Admitting: Urology

## 2016-10-14 NOTE — Telephone Encounter (Signed)
Pt's mom called and states pt has a bladder infection.  She is unable to have transportation bring him in and wants to know if we will call in an antibiotic.  Please give her a call, Mickel Baas, [phone]

## 2016-10-14 NOTE — Telephone Encounter (Signed)
Spoke with pt mother in reference to pt having UTI s/s. Reinforced with pt mom, pt would need to be seen for a nurse visit prior to abx given. Mother voiced understanding. Pt was made a nurse visit on Friday morning.

## 2016-10-15 NOTE — Telephone Encounter (Signed)
Spoke to Gerald Montgomery. Patient was seen at [facility]

## 2016-10-16 ENCOUNTER — Ambulatory Visit (INDEPENDENT_AMBULATORY_CARE_PROVIDER_SITE_OTHER): Payer: Medicare Other

## 2016-10-16 VITALS — BP 121/81 | HR 106 | Resp 16 | Ht 69.0 in

## 2016-10-16 DIAGNOSIS — N390 Urinary tract infection, site not specified: Secondary | ICD-10-CM | POA: Diagnosis not present

## 2016-10-16 LAB — URINALYSIS, COMPLETE
Bilirubin, UA: NEGATIVE
Glucose, UA: NEGATIVE
Ketones, UA: NEGATIVE
NITRITE UA: POSITIVE — AB
PH UA: 7 (ref 5.0–7.5)
Protein, UA: NEGATIVE
Specific Gravity, UA: 1.01 (ref 1.005–1.030)
UUROB: 1 mg/dL (ref 0.2–1.0)

## 2016-10-16 LAB — MICROSCOPIC EXAMINATION
Epithelial Cells (non renal): >10 /hpf — ABNORMAL HIGH (ref 0–10)
RBC MICROSCOPIC, UA: NONE SEEN /HPF (ref 0–?)

## 2016-10-16 NOTE — Progress Notes (Signed)
Pt presents today with c/o urinary frequency, hard to postpone urination, dark colored urine, lower abd pain, night sweats, and chills. A CATH specimen was obtained for u/a and cx.  Blood pressure 121/81, pulse (!) 106, resp. rate 16, height 5\' 9"  (1.753 m).

## 2016-10-18 LAB — CULTURE, URINE COMPREHENSIVE

## 2016-10-21 ENCOUNTER — Telehealth: Payer: Self-pay

## 2016-10-21 MED ORDER — NITROFURANTOIN MONOHYD MACRO 100 MG PO CAPS: 100.0000 mg | ORAL_CAPSULE | Freq: Two times a day (BID) | ORAL | 0 refills | Status: DC

## 2016-10-21 NOTE — Telephone Encounter (Signed)
Spoke to patient's mother. Gave med instructions. Mother verbalized understanding.

## 2016-11-12 ENCOUNTER — Other Ambulatory Visit: Payer: Self-pay | Admitting: Family Medicine

## 2016-11-19 ENCOUNTER — Other Ambulatory Visit: Payer: Self-pay | Admitting: Family Medicine

## 2016-11-19 DIAGNOSIS — Z86718 Personal history of other venous thrombosis and embolism: Secondary | ICD-10-CM

## 2016-11-20 NOTE — Telephone Encounter (Signed)
Pt is asking that you please refill xarelto. The pharmacy gave him a 3 day supply. Thank you

## 2016-11-28 ENCOUNTER — Other Ambulatory Visit: Payer: Self-pay | Admitting: Family Medicine

## 2016-11-28 DIAGNOSIS — N319 Neuromuscular dysfunction of bladder, unspecified: Secondary | ICD-10-CM

## 2016-11-30 ENCOUNTER — Other Ambulatory Visit: Payer: Self-pay | Admitting: Family Medicine

## 2016-11-30 DIAGNOSIS — N319 Neuromuscular dysfunction of bladder, unspecified: Secondary | ICD-10-CM

## 2016-11-30 NOTE — Telephone Encounter (Signed)
Last cmp and A1c reviewed; Rx approved in colleague's absence

## 2016-12-21 ENCOUNTER — Other Ambulatory Visit: Payer: Self-pay | Admitting: Family Medicine

## 2016-12-22 ENCOUNTER — Telehealth: Payer: Self-pay | Admitting: Family Medicine

## 2016-12-22 ENCOUNTER — Other Ambulatory Visit: Payer: Self-pay | Admitting: Family Medicine

## 2016-12-22 DIAGNOSIS — N319 Neuromuscular dysfunction of bladder, unspecified: Secondary | ICD-10-CM

## 2016-12-22 MED ORDER — BETHANECHOL CHLORIDE 50 MG PO TABS: ORAL_TABLET | ORAL | 0 refills | Status: DC

## 2016-12-22 NOTE — Telephone Encounter (Signed)
Pt has an appt on Thursday. Pt is out.

## 2016-12-22 NOTE — Telephone Encounter (Signed)
[name] (sister) is requesting return call. States someone told her that Gerald Montgomery had a referral placed for neurology doctor therefore his medication for his bladder was denied and he is needing this medication. Has been without this medication going on day 3.  [phone]

## 2016-12-22 NOTE — Telephone Encounter (Signed)
Dr. Manuella Ghazi please return patient call concerning referral and medication: [name] (sister) is requesting return call. States someone told her that kam had a referral placed for neurology doctor therefore his medication for his bladder was denied and he is needing this medication. Has been without this medication going on day 3.  [phone]

## 2016-12-22 NOTE — Telephone Encounter (Signed)
Spoke with patient's mother and authorized a refill for bethanechol 50 mg 3 times a day for neurogenic bladder, prescription sent to pharmacy

## 2016-12-24 ENCOUNTER — Ambulatory Visit: Payer: Medicare Other | Admitting: Family Medicine

## 2016-12-30 ENCOUNTER — Telehealth: Payer: Self-pay | Admitting: Family Medicine

## 2016-12-30 DIAGNOSIS — N319 Neuromuscular dysfunction of bladder, unspecified: Secondary | ICD-10-CM

## 2016-12-31 ENCOUNTER — Encounter: Payer: Self-pay | Admitting: Urology

## 2016-12-31 ENCOUNTER — Ambulatory Visit: Payer: Self-pay | Admitting: Urology

## 2016-12-31 NOTE — Telephone Encounter (Signed)
Pt informed and he already had appt for next week. I did ask the mom to keep that appt and she verbalized understanding

## 2017-01-05 ENCOUNTER — Encounter: Payer: Self-pay | Admitting: Family Medicine

## 2017-01-05 ENCOUNTER — Ambulatory Visit (INDEPENDENT_AMBULATORY_CARE_PROVIDER_SITE_OTHER): Payer: Medicare Other | Admitting: Family Medicine

## 2017-01-05 VITALS — BP 123/73 | HR 101 | Temp 97.9°F | Resp 17

## 2017-01-05 DIAGNOSIS — N319 Neuromuscular dysfunction of bladder, unspecified: Secondary | ICD-10-CM | POA: Diagnosis not present

## 2017-01-05 DIAGNOSIS — Z86718 Personal history of other venous thrombosis and embolism: Secondary | ICD-10-CM | POA: Diagnosis not present

## 2017-01-05 DIAGNOSIS — F39 Unspecified mood [affective] disorder: Secondary | ICD-10-CM | POA: Diagnosis not present

## 2017-01-05 MED ORDER — ARIPIPRAZOLE 15 MG PO TABS: ORAL_TABLET | ORAL | 1 refills | Status: DC

## 2017-01-05 MED ORDER — RIVAROXABAN 10 MG PO TABS: ORAL_TABLET | ORAL | 1 refills | Status: DC

## 2017-01-05 MED ORDER — BETHANECHOL CHLORIDE 50 MG PO TABS: ORAL_TABLET | ORAL | 1 refills | Status: DC

## 2017-01-05 NOTE — Progress Notes (Signed)
Name: Gerald Montgomery   MRN: [id number]    DOB: August 02, 1955   Date:01/05/2017       Progress Note  Subjective  Chief Complaint  Chief Complaint  Patient presents with  . Follow-up    HPI  Mood disorder: Patient has long-standing history of mood symptoms, specifically irritability, anxiety, mother reports that he occasionally cries, especially when not on Abilify. He is otherwise at baseline as well as neuropsychiatric history is concerned. Continues to take Abilify as prescribed   DVT of lower extremity: Patient is now successfully transitioned to Xarelto for history of DVT in the lower extremity, taking Xarelto 10 mg daily, no bleeding episodes reported   Neurogenic bladder:  Patient has symptoms consistent with neurogenic bladder, this includes urinary incontinence, he takes bethanechol for symptomatic relief and treatment, requesting refills  Past Medical History:  Diagnosis Date  . Depression   . History of DVT of lower extremity   . Mood disorder ([place])   . Neurogenic bladder   . Urinary incontinence     Past Surgical History:  Procedure Laterality Date  . filter surgery for blood clot  2013  . HIP FRACTURE SURGERY  2013    Family History  Problem Relation Age of Onset  . Heart disease Mother   . Atrial fibrillation Mother   . Diabetes Mother        Borderline  . Heart disease Father        Passed away of heart attack  . Kidney disease Neg Hx   . Prostate cancer Neg Hx     Social History   Social History  . Marital status: Single    Spouse name: N/A  . Number of children: N/A  . Years of education: N/A   Occupational History  . Not on file.   Social History Main Topics  . Smoking status: Never Smoker  . Smokeless tobacco: Never Used  . Alcohol use No  . Drug use: No  . Sexual activity: No   Other Topics Concern  . Not on file   Social History Narrative  . No narrative on file     Current Outpatient Prescriptions:  .  ARIPiprazole  (ABILIFY) 15 MG tablet, TAKE 1 TABLET(15 MG) BY MOUTH DAILY, Disp: 30 tablet, Rfl: 0 .  bethanechol (URECHOLINE) 50 MG tablet, TAKE 1 TABLET(50 MG) BY MOUTH THREE TIMES DAILY, Disp: 270 tablet, Rfl: 0 .  nitrofurantoin, macrocrystal-monohydrate, (MACROBID) 100 MG capsule, Take 1 capsule (100 mg total) by mouth 2 (two) times daily., Disp: 14 capsule, Rfl: 0 .  XARELTO 10 MG TABS tablet, TAKE 1 TABLET(10 MG) BY MOUTH DAILY, Disp: 90 tablet, Rfl: 0  Allergies  Allergen Reactions  . Penicillins     Sickness/sweating  . Sulfa Antibiotics Itching     ROS  Please see history of present illness for complete discussion of ROS  Objective  Vitals:   01/05/17 1118  BP: 123/73  Pulse: (!) 101  Resp: 17  Temp: 97.9 F (36.6 C)  TempSrc: Oral  SpO2: 99%    Physical Exam  Constitutional: He is well-developed, well-nourished, and in no distress.  Cardiovascular: Normal rate, regular rhythm and normal heart sounds.   No murmur heard. Pulmonary/Chest: Effort normal and breath sounds normal. He has no wheezes.  Musculoskeletal: He exhibits no edema.  Left lower extremity contractures.  Psychiatric: Mood, memory, affect and judgment normal.  Nursing note and vitals reviewed.    Assessment & Plan  1. History of DVT of  lower extremity Continue on anticoagulant, refills provided - rivaroxaban (XARELTO) 10 MG TABS tablet; TAKE 1 TABLET(10 MG) BY MOUTH DAILY  Dispense: 90 tablet; Refill: 1  2. Neurogenic bladder Stable, continue on bethanechol - bethanechol (URECHOLINE) 50 MG tablet; TAKE 1 TABLET(50 MG) BY MOUTH THREE TIMES DAILY  Dispense: 270 tablet; Refill: 1  3. Mood disorder (HCC)  - ARIPiprazole (ABILIFY) 15 MG tablet; TAKE 1 TABLET(15 MG) BY MOUTH DAILY  Dispense: 90 tablet; Refill: 1   [name] Asad A. [facility] 01/05/2017 11:26 AM

## 2017-02-04 ENCOUNTER — Telehealth: Payer: Self-pay | Admitting: Family Medicine

## 2017-02-04 NOTE — Telephone Encounter (Signed)
Pt needs refill on Abilify.

## 2017-02-04 NOTE — Telephone Encounter (Signed)
A prescription for Abilify was sent in August 2018 for 90 days with one refill. Patient should contact his pharmacy for refills.

## 2017-02-04 NOTE — Telephone Encounter (Signed)
LMOM to inform

## 2017-02-05 ENCOUNTER — Encounter: Payer: Self-pay | Admitting: Emergency Medicine

## 2017-02-05 ENCOUNTER — Inpatient Hospital Stay
Admission: EM | Admit: 2017-02-05 | Discharge: 2017-02-08 | DRG: 872 | Disposition: A | Discharge status: Home / Self Care | Payer: Medicare Other | Attending: Internal Medicine | Admitting: Internal Medicine

## 2017-02-05 DIAGNOSIS — Z880 Allergy status to penicillin: Secondary | ICD-10-CM | POA: Diagnosis not present

## 2017-02-05 DIAGNOSIS — A419 Sepsis, unspecified organism: Principal | ICD-10-CM | POA: Diagnosis present

## 2017-02-05 DIAGNOSIS — Z993 Dependence on wheelchair: Secondary | ICD-10-CM

## 2017-02-05 DIAGNOSIS — K210 Gastro-esophageal reflux disease with esophagitis: Secondary | ICD-10-CM | POA: Diagnosis present

## 2017-02-05 DIAGNOSIS — D649 Anemia, unspecified: Secondary | ICD-10-CM | POA: Diagnosis not present

## 2017-02-05 DIAGNOSIS — Z7401 Bed confinement status: Secondary | ICD-10-CM | POA: Diagnosis not present

## 2017-02-05 DIAGNOSIS — Z7901 Long term (current) use of anticoagulants: Secondary | ICD-10-CM | POA: Diagnosis not present

## 2017-02-05 DIAGNOSIS — D500 Iron deficiency anemia secondary to blood loss (chronic): Secondary | ICD-10-CM | POA: Diagnosis present

## 2017-02-05 DIAGNOSIS — Z86718 Personal history of other venous thrombosis and embolism: Secondary | ICD-10-CM

## 2017-02-05 DIAGNOSIS — Z23 Encounter for immunization: Secondary | ICD-10-CM

## 2017-02-05 DIAGNOSIS — I82409 Acute embolism and thrombosis of unspecified deep veins of unspecified lower extremity: Secondary | ICD-10-CM | POA: Diagnosis not present

## 2017-02-05 DIAGNOSIS — K920 Hematemesis: Secondary | ICD-10-CM | POA: Diagnosis present

## 2017-02-05 DIAGNOSIS — Z882 Allergy status to sulfonamides status: Secondary | ICD-10-CM

## 2017-02-05 DIAGNOSIS — F329 Major depressive disorder, single episode, unspecified: Secondary | ICD-10-CM | POA: Diagnosis present

## 2017-02-05 DIAGNOSIS — B3781 Candidal esophagitis: Secondary | ICD-10-CM | POA: Diagnosis not present

## 2017-02-05 DIAGNOSIS — R944 Abnormal results of kidney function studies: Secondary | ICD-10-CM | POA: Diagnosis present

## 2017-02-05 DIAGNOSIS — G809 Cerebral palsy, unspecified: Secondary | ICD-10-CM | POA: Diagnosis present

## 2017-02-05 DIAGNOSIS — N390 Urinary tract infection, site not specified: Secondary | ICD-10-CM | POA: Diagnosis present

## 2017-02-05 DIAGNOSIS — Z79899 Other long term (current) drug therapy: Secondary | ICD-10-CM | POA: Diagnosis not present

## 2017-02-05 DIAGNOSIS — K910 Vomiting following gastrointestinal surgery: Secondary | ICD-10-CM | POA: Diagnosis not present

## 2017-02-05 DIAGNOSIS — R350 Frequency of micturition: Secondary | ICD-10-CM | POA: Diagnosis not present

## 2017-02-05 DIAGNOSIS — N319 Neuromuscular dysfunction of bladder, unspecified: Secondary | ICD-10-CM | POA: Diagnosis present

## 2017-02-05 DIAGNOSIS — K922 Gastrointestinal hemorrhage, unspecified: Secondary | ICD-10-CM | POA: Diagnosis not present

## 2017-02-05 DIAGNOSIS — K209 Esophagitis, unspecified: Secondary | ICD-10-CM | POA: Diagnosis not present

## 2017-02-05 DIAGNOSIS — L8990 Pressure ulcer of unspecified site, unspecified stage: Secondary | ICD-10-CM | POA: Insufficient documentation

## 2017-02-05 DIAGNOSIS — N399 Disorder of urinary system, unspecified: Secondary | ICD-10-CM | POA: Diagnosis not present

## 2017-02-05 LAB — CBC WITH DIFFERENTIAL/PLATELET
Basophils Absolute: 0.1 10*3/uL (ref 0–0.1)
Basophils Relative: 0 %
EOS ABS: 0 10*3/uL (ref 0–0.7)
EOS PCT: 0 %
HCT: 36.6 % — ABNORMAL LOW (ref 40.0–52.0)
HEMOGLOBIN: 12.2 g/dL — AB (ref 13.0–18.0)
LYMPHS PCT: 1 %
Lymphs Abs: 0.2 10*3/uL — ABNORMAL LOW (ref 1.0–3.6)
MCH: 27.9 pg (ref 26.0–34.0)
MCHC: 33.3 g/dL (ref 32.0–36.0)
MCV: 83.8 fL (ref 80.0–100.0)
MONOS PCT: 4 %
Monocytes Absolute: 0.6 10*3/uL (ref 0.2–1.0)
Neutro Abs: 16.1 10*3/uL — ABNORMAL HIGH (ref 1.4–6.5)
Neutrophils Relative %: 95 %
PLATELETS: 332 10*3/uL (ref 150–440)
RBC: 4.37 MIL/uL — ABNORMAL LOW (ref 4.40–5.90)
RDW: 14.8 % — ABNORMAL HIGH (ref 11.5–14.5)
WBC: 17 10*3/uL — ABNORMAL HIGH (ref 3.8–10.6)

## 2017-02-05 LAB — COMPREHENSIVE METABOLIC PANEL
ALK PHOS: 68 U/L (ref 38–126)
ALT: 32 U/L (ref 17–63)
ANION GAP: 11 (ref 5–15)
AST: 53 U/L — ABNORMAL HIGH (ref 15–41)
Albumin: 3.3 g/dL — ABNORMAL LOW (ref 3.5–5.0)
BUN: 36 mg/dL — ABNORMAL HIGH (ref 6–20)
CALCIUM: 7.8 mg/dL — AB (ref 8.9–10.3)
CO2: 23 mmol/L (ref 22–32)
CREATININE: 0.64 mg/dL (ref 0.61–1.24)
Chloride: 111 mmol/L (ref 101–111)
Glucose, Bld: 151 mg/dL — ABNORMAL HIGH (ref 65–99)
Potassium: 4.1 mmol/L (ref 3.5–5.1)
SODIUM: 145 mmol/L (ref 135–145)
Total Bilirubin: 0.6 mg/dL (ref 0.3–1.2)
Total Protein: 6.4 g/dL — ABNORMAL LOW (ref 6.5–8.1)

## 2017-02-05 LAB — LACTIC ACID, PLASMA
Lactic Acid, Venous: 4.4 mmol/L (ref 0.5–1.9)
Lactic Acid, Venous: 2.5 mmol/L (ref 0.5–1.9)

## 2017-02-05 LAB — URINALYSIS, COMPLETE (UACMP) WITH MICROSCOPIC
Bilirubin Urine: NEGATIVE
GLUCOSE, UA: NEGATIVE mg/dL
Ketones, ur: 20 mg/dL — AB
NITRITE: NEGATIVE
PH: 5 (ref 5.0–8.0)
PROTEIN: 30 mg/dL — AB
SPECIFIC GRAVITY, URINE: 1.021 (ref 1.005–1.030)

## 2017-02-05 LAB — HEMOGLOBIN AND HEMATOCRIT, BLOOD
HCT: 33.7 % — ABNORMAL LOW (ref 40.0–52.0)
HEMATOCRIT: 31 % — AB (ref 40.0–52.0)
HEMOGLOBIN: 10.4 g/dL — AB (ref 13.0–18.0)
Hemoglobin: 11.4 g/dL — ABNORMAL LOW (ref 13.0–18.0)

## 2017-02-05 LAB — LIPASE, BLOOD: LIPASE: 15 U/L (ref 11–51)

## 2017-02-05 LAB — CK: CK TOTAL: 31 U/L — AB (ref 49–397)

## 2017-02-05 MED ORDER — SODIUM CHLORIDE 0.9 % IV BOLUS (SEPSIS)
500.0000 mL | Freq: Once | INTRAVENOUS | Status: AC
  Administered 2017-02-05: 500 mL via INTRAVENOUS

## 2017-02-05 MED ORDER — METOCLOPRAMIDE HCL 5 MG/ML IJ SOLN
5.0000 mg | Freq: Once | INTRAMUSCULAR | Status: AC
  Administered 2017-02-05: 21:00:00 5 mg via INTRAVENOUS
  Filled 2017-02-05: qty 2

## 2017-02-05 MED ORDER — ONDANSETRON HCL 4 MG PO TABS: 4.0000 mg | ORAL_TABLET | Freq: Four times a day (QID) | ORAL | Status: DC | PRN

## 2017-02-05 MED ORDER — PANTOPRAZOLE SODIUM 40 MG IV SOLR
40.0000 mg | Freq: Two times a day (BID) | INTRAVENOUS | Status: DC
  Administered 2017-02-05 – 2017-02-08 (×5): 40 mg via INTRAVENOUS
  Filled 2017-02-05 (×5): qty 40

## 2017-02-05 MED ORDER — SODIUM CHLORIDE 0.9 % IV BOLUS (SEPSIS)
1000.0000 mL | Freq: Once | INTRAVENOUS | Status: AC
  Administered 2017-02-05: 1000 mL via INTRAVENOUS

## 2017-02-05 MED ORDER — ORAL CARE MOUTH RINSE
15.0000 mL | Freq: Two times a day (BID) | OROMUCOSAL | Status: DC
  Administered 2017-02-05 – 2017-02-08 (×6): 15 mL via OROMUCOSAL

## 2017-02-05 MED ORDER — RIVAROXABAN 10 MG PO TABS
10.0000 mg | ORAL_TABLET | Freq: Every day | ORAL | Status: DC
  Filled 2017-02-05: qty 1

## 2017-02-05 MED ORDER — ARIPIPRAZOLE 5 MG PO TABS
15.0000 mg | ORAL_TABLET | Freq: Every day | ORAL | Status: DC
  Administered 2017-02-08: 15 mg via ORAL
  Filled 2017-02-05 (×4): qty 3

## 2017-02-05 MED ORDER — LEVOFLOXACIN IN D5W 750 MG/150ML IV SOLN
750.0000 mg | Freq: Once | INTRAVENOUS | Status: AC
  Administered 2017-02-05: 750 mg via INTRAVENOUS
  Filled 2017-02-05: qty 150

## 2017-02-05 MED ORDER — SODIUM CHLORIDE 0.9 % IV BOLUS (SEPSIS)
250.0000 mL | Freq: Once | INTRAVENOUS | Status: AC
  Administered 2017-02-05: 250 mL via INTRAVENOUS

## 2017-02-05 MED ORDER — DEXTROSE 5 % IV SOLN
2.0000 g | Freq: Once | INTRAVENOUS | Status: AC
  Administered 2017-02-05: 2 g via INTRAVENOUS
  Filled 2017-02-05: qty 2

## 2017-02-05 MED ORDER — ONDANSETRON HCL 4 MG/2ML IJ SOLN
4.0000 mg | Freq: Four times a day (QID) | INTRAMUSCULAR | Status: DC | PRN
  Administered 2017-02-06 – 2017-02-07 (×2): 4 mg via INTRAVENOUS
  Filled 2017-02-05 (×2): qty 2

## 2017-02-05 MED ORDER — ACETAMINOPHEN 650 MG RE SUPP
650.0000 mg | Freq: Four times a day (QID) | RECTAL | Status: DC | PRN
  Administered 2017-02-06: 02:00:00 650 mg via RECTAL
  Filled 2017-02-05: qty 1

## 2017-02-05 MED ORDER — SODIUM CHLORIDE 0.9 % IV SOLN
1.0000 g | Freq: Three times a day (TID) | INTRAVENOUS | Status: DC
  Administered 2017-02-05 – 2017-02-08 (×8): 1 g via INTRAVENOUS
  Filled 2017-02-05 (×10): qty 1

## 2017-02-05 MED ORDER — INFLUENZA VAC SPLIT QUAD 0.5 ML IM SUSY
0.5000 mL | PREFILLED_SYRINGE | INTRAMUSCULAR | Status: AC
  Administered 2017-02-06: 0.5 mL via INTRAMUSCULAR
  Filled 2017-02-05: qty 0.5

## 2017-02-05 MED ORDER — ACETAMINOPHEN 325 MG PO TABS
650.0000 mg | ORAL_TABLET | Freq: Four times a day (QID) | ORAL | Status: DC | PRN
  Administered 2017-02-06 – 2017-02-07 (×2): 650 mg via ORAL
  Filled 2017-02-05 (×2): qty 2

## 2017-02-05 NOTE — H&P (Addendum)
[place] [facility] [name] NAME: Gerald Montgomery    MR#:  [id number]  DATE OF BIRTH:  05/19/1956  DATE OF ADMISSION:  02/05/2017  PRIMARY CARE PHYSICIAN: Roselee Nova, MD   REQUESTING/REFERRING PHYSICIAN: Orbie Pyo, MD  CHIEF COMPLAINT:  Frequent urination  HISTORY OF PRESENT ILLNESS:  Gerald Montgomery  is a 61 y.o. male with a known history of cerebral palsy, neurogenic bladder and chronic lower extremity DVTs presenting to the ED with a chief complaint of frequent urination. Patient has abnormal urinalysis and tachycardic. White count is elevated. Patient is started on broad-spectrum IV antibiotics and hospitalist team is called to admit the patient. Mom is at bedside. RN has noticed small amount of hematemesis.  PAST MEDICAL HISTORY:   Past Medical History:  Diagnosis Date  . Depression   . History of DVT of lower extremity   . Mood disorder ([place])   . Neurogenic bladder   . Urinary incontinence     PAST SURGICAL HISTOIRY:   Past Surgical History:  Procedure Laterality Date  . filter surgery for blood clot  2013  . HIP FRACTURE SURGERY  2013    SOCIAL HISTORY:   Social History  Substance Use Topics  . Smoking status: Never Smoker  . Smokeless tobacco: Never Used  . Alcohol use No    FAMILY HISTORY:   Family History  Problem Relation Age of Onset  . Heart disease Mother   . Atrial fibrillation Mother   . Diabetes Mother        Borderline  . Heart disease Father        Passed away of heart attack  . Kidney disease Neg Hx   . Prostate cancer Neg Hx     DRUG ALLERGIES:   Allergies  Allergen Reactions  . Penicillins     Sickness/sweating  . Sulfa Antibiotics Itching    REVIEW OF SYSTEMS:  CONSTITUTIONAL: No fever, fatigue or weakness.  EYES: No blurred or double vision.  EARS, NOSE, AND THROAT: No tinnitus or ear pain.  RESPIRATORY: No cough, shortness of breath, wheezing or hemoptysis.   CARDIOVASCULAR: No chest pain, orthopnea, edema.  GASTROINTESTINAL: No nausea, vomiting, diarrhea or abdominal pain.  GENITOURINARY: No dysuria, hematuria.  ENDOCRINE: No polyuria, nocturia,  HEMATOLOGY: No anemia, easy bruising or bleeding SKIN: No rash or lesion. MUSCULOSKELETAL: No joint pain or arthritis.   NEUROLOGIC: No tingling, numbness, has Chronic weakness of the extremities for cerebral palsy PSYCHIATRY: No anxiety or depression.   MEDICATIONS AT HOME:   Prior to Admission medications   Medication Sig Start Date End Date Taking? Authorizing Provider  ARIPiprazole (ABILIFY) 15 MG tablet TAKE 1 TABLET(15 MG) BY MOUTH DAILY 01/05/17  Yes Roselee Nova, MD  rivaroxaban (XARELTO) 10 MG TABS tablet TAKE 1 TABLET(10 MG) BY MOUTH DAILY 01/05/17  Yes Keith Rake Asad A, MD  bethanechol (URECHOLINE) 50 MG tablet TAKE 1 TABLET(50 MG) BY MOUTH THREE TIMES DAILY Patient not taking: Reported on 02/05/2017 01/05/17   Roselee Nova, MD  nitrofurantoin, macrocrystal-monohydrate, (MACROBID) 100 MG capsule Take 1 capsule (100 mg total) by mouth 2 (two) times daily. Patient not taking: Reported on 02/05/2017 10/21/16   Nickie Retort, MD      VITAL SIGNS:  Blood pressure 118/83, pulse (!) 121, temperature 98.6 F (37 C), temperature source Oral, resp. rate (!) 27, height 5\' 8"  (1.727 m), weight 54.4 kg (120 lb), SpO2 98 %.  PHYSICAL EXAMINATION:  GENERAL:  61 y.o.-year-old patient lying in the bed with no acute distress.  EYES: Pupils equal, round, reactive to light and accommodation. No scleral icterus. Extraocular muscles intact.  HEENT: Head atraumatic, normocephalic. Oropharynx and nasopharynx clear.  NECK:  Supple, no jugular venous distention. No thyroid enlargement, no tenderness.  LUNGS: Normal breath sounds bilaterally, no wheezing, rales,rhonchi or crepitation. No use of accessory muscles of respiration.  CARDIOVASCULAR: S1, S2 normal. No murmurs, rubs, or gallops.   ABDOMEN: Soft, nontender, nondistended. Bowel sounds present. No organomegaly or mass.  EXTREMITIES: No pedal edema, cyanosis, or clubbing.  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  SKIN: No obvious rash, lesion, or ulcer.   LABORATORY PANEL:   CBC  Recent Labs Lab 02/05/17 1114  WBC 17.0*  HGB 12.2*  HCT 36.6*  PLT 332   ------------------------------------------------------------------------------------------------------------------  Chemistries   Recent Labs Lab 02/05/17 1114  NA 145  K 4.1  CL 111  CO2 23  GLUCOSE 151*  BUN 36*  CREATININE 0.64  CALCIUM 7.8*  AST 53*  ALT 32  ALKPHOS 68  BILITOT 0.6   ------------------------------------------------------------------------------------------------------------------  Cardiac Enzymes No results for input(s): TROPONINI in the last 168 hours. ------------------------------------------------------------------------------------------------------------------  RADIOLOGY:  No results found.  EKG:   Orders placed or performed during the hospital encounter of 02/05/17  . ED EKG 12-Lead  . ED EKG 12-Lead    IMPRESSION AND PLAN:   Gerald Montgomery  is a 61 y.o. male with a known history of cerebral palsy, neurogenic bladder and chronic lower extremity DVTs presenting to the ED with a chief complaint of frequent urination. Patient has abnormal urinalysis and tachycardic.  # Sepsis - secondary to UTI Admit to MedSurg unit Patient meets septic criteria with leukocytosis, elevated lactic acid and tachycardia Blood cultures and urine cultures were ordered Patient being llergic to penicillin started him on aztreonam Hydrate with IV fluids  # Hematemesis Hold xarelto  Nothing by mouth, IV fluids, PPI, GI consult NG tube as needed Monitor hemoglobin and hematocrit closely and transfuse as  needed  #Chronic history of lower extremity DVT  Holding Xarelto in view of hematemesis  #Chronic history of urinary incontinence and neurogenic bladder Continue [name]   #chronic history of cerebral palsy Patient is wheelchair bound and needs  feeding assistance at his baseline    All the records are reviewed and case discussed with ED provider. Management plans discussed with the patient, family and they are in agreement.  CODE STATUS: fc/mom HCPOA  TOTAL TIME TAKING CARE OF THIS PATIENT: 45  minutes.   Note: This dictation was prepared with Dragon dictation along with smaller phrase technology. Any transcriptional errors that result from this process are unintentional.  Nicholes Mango M.D on 02/05/2017 at 1:39 PM  Between 7am to 6pm - Pager - [phone]  After 6pm go to [email or web address] - password [email or web address] [facility]  [facility]  Office  [phone]  CC: Primary care physician; Roselee Nova, MD

## 2017-02-05 NOTE — ED Triage Notes (Signed)
Pt to ED by EMS with c/o of urinary retention. Per caregiver and pt's mother pt has not voided since yesterday after noon between 12-1 pm. Pt has recurrent UTI's.

## 2017-02-05 NOTE — ED Provider Notes (Signed)
[facility] Emergency Department Provider Note  ____________________________________________   First MD Initiated Contact with Patient 02/05/17 1036     (approximate)  I have reviewed the triage vital signs and the nursing notes.   HISTORY  Chief Complaint Recurrent UTI   HPI Gerald Montgomery is a 61 y.o. male with a history of depression, DVT, urinary incontinence as well as cerebral palsy who is presenting to the emergency department today with decreased urine output. His last no known output was yesterday at about noon. He has been eating and drinking as normal. He is reporting mild abdominal pain. Tachycardic for EMS to the 130s.   Past Medical History:  Diagnosis Date  . Depression   . History of DVT of lower extremity   . Mood disorder ([place])   . Neurogenic bladder   . Urinary incontinence     Patient Active Problem List   Diagnosis Date Noted  . Stage I pressure ulcer of right upper back 08/27/2016  . Abnormal urine odor 07/16/2016  . Need for home health care 06/17/2016  . Hyperglycemia 03/04/2016  . Screening for lipid disorders 03/04/2016  . LLL pneumonia ([place]) 02/12/2016  . Cerumen impaction 02/12/2016  . Pneumonia 02/05/2016  . Cerebral palsy ([place]) 12/05/2015  . Hematuria, gross 10/07/2015  . History of DVT of lower extremity 04/11/2015  . Mood disorder ([place]) 04/11/2015  . Neurogenic bladder 04/11/2015    Past Surgical History:  Procedure Laterality Date  . filter surgery for blood clot  2013  . HIP FRACTURE SURGERY  2013    Prior to Admission medications   Medication Sig Start Date End Date Taking? Authorizing Provider  ARIPiprazole (ABILIFY) 15 MG tablet TAKE 1 TABLET(15 MG) BY MOUTH DAILY 01/05/17   Roselee Nova, MD  bethanechol (URECHOLINE) 50 MG tablet TAKE 1 TABLET(50 MG) BY MOUTH THREE TIMES DAILY 01/05/17   Roselee Nova, MD  nitrofurantoin, macrocrystal-monohydrate, (MACROBID) 100 MG capsule Take 1 capsule  (100 mg total) by mouth 2 (two) times daily. 10/21/16   Nickie Retort, MD  rivaroxaban (XARELTO) 10 MG TABS tablet TAKE 1 TABLET(10 MG) BY MOUTH DAILY 01/05/17   Roselee Nova, MD    Allergies Penicillins and Sulfa antibiotics  Family History  Problem Relation Age of Onset  . Heart disease Mother   . Atrial fibrillation Mother   . Diabetes Mother        Borderline  . Heart disease Father        Passed away of heart attack  . Kidney disease Neg Hx   . Prostate cancer Neg Hx     Social History Social History  Substance Use Topics  . Smoking status: Never Smoker  . Smokeless tobacco: Never Used  . Alcohol use No    Review of Systems  Constitutional: chills Eyes: No visual changes. ENT: No sore throat. Cardiovascular: Denies chest pain. Respiratory: Denies shortness of breath. Gastrointestinal:  No nausea, no vomiting.  No diarrhea.  No constipation. Genitourinary: Negative for dysuria. Musculoskeletal: Negative for back pain. Skin: Negative for rash. Neurological: Negative for headaches, focal weakness or numbness.   ____________________________________________   PHYSICAL EXAM:  VITAL SIGNS: ED Triage Vitals  Enc Vitals Group     BP 02/05/17 1101 (!) 121/94     Pulse Rate 02/05/17 1101 (!) 124     Resp 02/05/17 1101 20     Temp 02/05/17 1101 98.6 F (37 C)     Temp Source  02/05/17 1101 Oral     SpO2 02/05/17 1101 99 %     Weight 02/05/17 1102 120 lb (54.4 kg)     Height 02/05/17 1102 5\' 8"  (1.727 m)     Head Circumference --      Peak Flow --      Pain Score --      Pain Loc --      Pain Edu? --      Excl. in [place]? --     Constitutional: Alert and oriented. Well appearing and in no acute distress. Eyes: Conjunctivae are normal.  Head: Atraumatic. Nose: No congestion/rhinnorhea. Mouth/Throat: Mucous membranes are moist.  Neck: No stridor.   Cardiovascular: tachycardic, regular rhythm. Grossly normal heart sounds.  Respiratory: Normal  respiratory effort.  No retractions. Lungs CTAB. Gastrointestinal: Soft with minimal periumbilical ttp. No distention.  Musculoskeletal: No lower extremity tenderness nor edema.  No joint effusions. Neurologic:  Normal speech and language. Contractures to the exremities Skin:  Skin is warm, dry and intact. No rash noted. Psychiatric: Mood and affect are normal. Speech and behavior are normal.  ____________________________________________   LABS (all labs ordered are listed, but only abnormal results are displayed)  Labs Reviewed  CBC WITH DIFFERENTIAL/PLATELET - Abnormal; Notable for the following:       Result Value   WBC 17.0 (*)    RBC 4.37 (*)    Hemoglobin 12.2 (*)    HCT 36.6 (*)    RDW 14.8 (*)    Neutro Abs 16.1 (*)    Lymphs Abs 0.2 (*)    All other components within normal limits  COMPREHENSIVE METABOLIC PANEL - Abnormal; Notable for the following:    Glucose, Bld 151 (*)    BUN 36 (*)    Calcium 7.8 (*)    Total Protein 6.4 (*)    Albumin 3.3 (*)    AST 53 (*)    All other components within normal limits  URINALYSIS, COMPLETE (UACMP) WITH MICROSCOPIC - Abnormal; Notable for the following:    Color, Urine AMBER (*)    APPearance CLOUDY (*)    Hgb urine dipstick SMALL (*)    Ketones, ur 20 (*)    Protein, ur 30 (*)    Leukocytes, UA MODERATE (*)    Bacteria, UA MANY (*)    Squamous Epithelial / LPF 6-30 (*)    All other components within normal limits  LACTIC ACID, PLASMA - Abnormal; Notable for the following:    Lactic Acid, Venous 4.4 (*)    All other components within normal limits  CULTURE, BLOOD (ROUTINE X 2)  CULTURE, BLOOD (ROUTINE X 2)  URINE CULTURE  LIPASE, BLOOD  LACTIC ACID, PLASMA  CK   ____________________________________________  EKG   ____________________________________________  RADIOLOGY   ____________________________________________   PROCEDURES  Procedure(s) performed:   Procedures  Critical Care performed:    ____________________________________________   INITIAL IMPRESSION / ASSESSMENT AND PLAN / ED COURSE  Pertinent labs & imaging results that were available during my care of the patient were reviewed by me and considered in my medical decision making (see chart for details).  ----------------------------------------- 12:37 PM on 02/05/2017 -----------------------------------------  Patient at this time showing signs of sepsis with an elevated lactic of 4.4.patient admitted to the hospital. Source is likely UTI. Family at bedside and say that this is exactly the patient presents with previous UTIs. Unknown penicillin allergy. We will give panel allergic antibiotic protocol. Signed out to Dr. [name]. Sepsis alert initiated.  ____________________________________________   FINAL CLINICAL IMPRESSION(S) / ED DIAGNOSES  Sepsis. Urinary tract infection.    NEW MEDICATIONS STARTED DURING THIS VISIT:  New Prescriptions   No medications on file     Note:  This document was prepared using Dragon voice recognition software and may include unintentional dictation errors.     Orbie Pyo, MD 02/05/17 1240

## 2017-02-05 NOTE — Consult Note (Signed)
Reason for Consult: Hematemesis Referring Physician: Dr. Hendricks Montgomery is an 61 y.o. male.  HPI: Seen in consultation at the request of Dr. Margaretmary Montgomery. The history is obtained through the patient, his bedside nurse, and review of his electronic medical record. No family was present at the time of my evaluation. He has a history of lower extremity DVT treated with Xarelto, chronic urinary incontinence and neurogenic bladder, and cerebral palsy.    Admitted through the Emergency Room earlier today for symptomatic UTI complicated by sepsis. His nurse noted dark borwn/coffee ground emesis. He had a small brown BM earlier today. He reports some ongoing nausea and diffuse abdominal pain. GI ROS is otherwise normal. The patient is on Xarelto with his last dose this morning. He denies any NSAIDs. He has no prior history of GI bleeding. He has never had an EGD, colonoscopy, or any colon cancer screening.   Past Medical History:  Diagnosis Date  . Depression   . History of DVT of lower extremity   . Mood disorder ([place])   . Neurogenic bladder   . Urinary incontinence     Past Surgical History:  Procedure Laterality Date  . filter surgery for blood clot  2013  . HIP FRACTURE SURGERY  2013    Family History  Problem Relation Age of Onset  . Heart disease Mother   . Atrial fibrillation Mother   . Diabetes Mother        Borderline  . Heart disease Father        Passed away of heart attack  . Kidney disease Neg Hx   . Prostate cancer Neg Hx     Social History:  reports that he has never smoked. He has never used smokeless tobacco. He reports that he does not drink alcohol or use drugs.  Allergies:  Allergies  Allergen Reactions  . Penicillins     Sickness/sweating  . Sulfa Antibiotics Itching    Medications:  I have reviewed the patient's current medications. Prior to Admission:  Prescriptions Prior to Admission  Medication Sig Dispense Refill Last Dose  . ARIPiprazole (ABILIFY)  15 MG tablet TAKE 1 TABLET(15 MG) BY MOUTH DAILY 90 tablet 1 Past Week at Unknown time  . rivaroxaban (XARELTO) 10 MG TABS tablet TAKE 1 TABLET(10 MG) BY MOUTH DAILY 90 tablet 1 02/04/2017 at 1800  . bethanechol (URECHOLINE) 50 MG tablet TAKE 1 TABLET(50 MG) BY MOUTH THREE TIMES DAILY (Patient not taking: Reported on 02/05/2017) 270 tablet 1 Completed Course at Unknown time  . nitrofurantoin, macrocrystal-monohydrate, (MACROBID) 100 MG capsule Take 1 capsule (100 mg total) by mouth 2 (two) times daily. (Patient not taking: Reported on 02/05/2017) 14 capsule 0 Completed Course at Unknown time   Scheduled: . ARIPiprazole  15 mg Oral Daily  . [START ON 02/06/2017] Influenza vac split quadrivalent PF  0.5 mL Intramuscular Tomorrow-1000  . mouth rinse  15 mL Mouth Rinse BID  . metoCLOPramide (REGLAN) injection  5 mg Intravenous Once  . pantoprazole (PROTONIX) IV  40 mg Intravenous Q12H   Continuous: . meropenem (MERREM) IV 1 g (02/05/17 2047)   [id number] **OR** acetaminophen, ondansetron **OR** ondansetron (ZOFRAN) IV  Results for orders placed or performed during the hospital encounter of 02/05/17 (from the past 48 hour(s))  CBC with Differential     Status: Abnormal   Collection Time: 02/05/17 11:14 AM  Result Value Ref Range   WBC 17.0 (H) 3.8 - 10.6 K/uL   RBC 4.37 (L) 4.40 -  5.90 MIL/uL   Hemoglobin 12.2 (L) 13.0 - 18.0 g/dL   HCT 36.6 (L) 40.0 - 52.0 %   MCV 83.8 80.0 - 100.0 fL   MCH 27.9 26.0 - 34.0 pg   MCHC 33.3 32.0 - 36.0 g/dL   RDW 14.8 (H) 11.5 - 14.5 %   Platelets 332 150 - 440 K/uL   Neutrophils Relative % 95 %   Neutro Abs 16.1 (H) 1.4 - 6.5 K/uL   Lymphocytes Relative 1 %   Lymphs Abs 0.2 (L) 1.0 - 3.6 K/uL   Monocytes Relative 4 %   Monocytes Absolute 0.6 0.2 - 1.0 K/uL   Eosinophils Relative 0 %   Eosinophils Absolute 0.0 0 - 0.7 K/uL   Basophils Relative 0 %   Basophils Absolute 0.1 0 - 0.1 K/uL  Comprehensive metabolic panel     Status: Abnormal    Collection Time: 02/05/17 11:14 AM  Result Value Ref Range   Sodium 145 135 - 145 mmol/L   Potassium 4.1 3.5 - 5.1 mmol/L   Chloride 111 101 - 111 mmol/L   CO2 23 22 - 32 mmol/L   Glucose, Bld 151 (H) 65 - 99 mg/dL   BUN 36 (H) 6 - 20 mg/dL   Creatinine, Ser 0.64 0.61 - 1.24 mg/dL   Calcium 7.8 (L) 8.9 - 10.3 mg/dL   Total Protein 6.4 (L) 6.5 - 8.1 g/dL   Albumin 3.3 (L) 3.5 - 5.0 g/dL   AST 53 (H) 15 - 41 U/L   ALT 32 17 - 63 U/L   Alkaline Phosphatase 68 38 - 126 U/L   Total Bilirubin 0.6 0.3 - 1.2 mg/dL   GFR calc non Af Amer >60 >60 mL/min   GFR calc Af Amer >60 >60 mL/min    Comment: (NOTE) The eGFR has been calculated using the CKD EPI equation. This calculation has not been validated in all clinical situations. eGFR's persistently <60 mL/min signify possible Chronic Kidney Disease.    Anion gap 11 5 - 15  Lipase, blood     Status: None   Collection Time: 02/05/17 11:14 AM  Result Value Ref Range   Lipase 15 11 - 51 U/L  Urinalysis, Complete w Microscopic     Status: Abnormal   Collection Time: 02/05/17 11:14 AM  Result Value Ref Range   Color, Urine AMBER (A) YELLOW    Comment: BIOCHEMICALS MAY BE AFFECTED BY COLOR   APPearance CLOUDY (A) CLEAR   Specific Gravity, Urine 1.021 1.005 - 1.030   pH 5.0 5.0 - 8.0   Glucose, UA NEGATIVE NEGATIVE mg/dL   Hgb urine dipstick SMALL (A) NEGATIVE   Bilirubin Urine NEGATIVE NEGATIVE   Ketones, ur 20 (A) NEGATIVE mg/dL   Protein, ur 30 (A) NEGATIVE mg/dL   Nitrite NEGATIVE NEGATIVE   Leukocytes, UA MODERATE (A) NEGATIVE   RBC / HPF 6-30 0 - 5 RBC/hpf   WBC, UA TOO NUMEROUS TO COUNT 0 - 5 WBC/hpf   Bacteria, UA MANY (A) NONE SEEN   Squamous Epithelial / LPF 6-30 (A) NONE SEEN   Mucus PRESENT   CK     Status: Abnormal   Collection Time: 02/05/17 11:14 AM  Result Value Ref Range   Total CK 31 (L) 49 - 397 U/L  Lactic acid, plasma     Status: Abnormal   Collection Time: 02/05/17 11:19 AM  Result Value Ref Range   Lactic  Acid, Venous 4.4 (HH) 0.5 - 1.9 mmol/L    Comment:  CRITICAL RESULT CALLED TO, READ BACK BY AND VERIFIED WITH KIM MAIN AT 1224 02/05/17 DAS   Lactic acid, plasma     Status: Abnormal   Collection Time: 02/05/17  2:46 PM  Result Value Ref Range   Lactic Acid, Venous 2.5 (HH) 0.5 - 1.9 mmol/L    Comment: CRITICAL RESULT CALLED TO, READ BACK BY AND VERIFIED WITH CHRIS BENNETT AT 1542 ON 02/05/2017 JJB   Hemoglobin and hematocrit, blood     Status: Abnormal   Collection Time: 02/05/17  3:18 PM  Result Value Ref Range   Hemoglobin 11.4 (L) 13.0 - 18.0 g/dL   HCT 33.7 (L) 40.0 - 52.0 %    No results found.  Review of Systems  Constitutional: Negative for chills and fever.  HENT: Negative for ear pain and nosebleeds.   Eyes: Negative for blurred vision and photophobia.  Respiratory: Negative for cough and hemoptysis.   Cardiovascular: Negative for chest pain and palpitations.  Gastrointestinal: Positive for nausea and vomiting.  Genitourinary: Positive for frequency. Negative for dysuria.  Musculoskeletal: Negative for myalgias and neck pain.  Skin: Negative for itching and rash.  Neurological: Negative for dizziness and headaches.  Endo/Heme/Allergies: Negative for polydipsia. Does not bruise/bleed easily.  Psychiatric/Behavioral: Negative for hallucinations and substance abuse.   Blood pressure 112/65, pulse (!) 118, temperature 97.8 F (36.6 C), resp. rate 18, height 5' 8"  (1.727 m), weight 120 lb (54.4 kg), SpO2 98 %. Physical Exam  Constitutional: He is oriented to person, place, and time. He appears well-nourished. No distress.  Chronically ill appearing. Sitting with an emesis bag. There is brown, thin emesis in the bag and in his mouth.  HENT:  Head: Normocephalic and atraumatic.  Mouth/Throat: No oropharyngeal exudate.  Eyes: Conjunctivae are normal. No scleral icterus.  Neck: Normal range of motion. Neck supple.  Cardiovascular: Normal rate and regular rhythm.    Respiratory: Effort normal and breath sounds normal.  GI: Soft. Bowel sounds are normal. He exhibits no distension. There is no tenderness. There is no rebound and no guarding.  Musculoskeletal: He exhibits no edema.  Contractions of his extremities  Neurological: He is alert and oriented to person, place, and time.  Skin: Skin is dry. No rash noted. No erythema.  Psychiatric: He has a normal mood and affect. His behavior is normal.    Assessment/Plan: Coffee ground emesis UTI with associated sepsis including tachycardia Elevated BUN at 36 Anemia - hgb 11.4 (Hgb 12.2 02/05/17) Xarelto for history of DVT, last dose 02/05/17 No prior endoscopy or GI bleeding No prior colon cancer screening  Suspected slow upper GI bleed occurring in the setting of Xarelto. He has anemia but his hgb was 11.3 in 2017. A recent baseline is unknown. However, his BUN is elevated.  He has tachycardia, but this is also occurring in the setting of sepsis from a UTI.  His emesis is not black or bright red. There is no obvious blood, but, it does have the appearance of coffee grounds.   I am recommending an EGD after a Xarelto washout. In the meantime, clear liquid diet (no red liquids), serial hgb/hct with transfusion as indicated, and empiric Protonix as has already been ordered. I discussed the risks, benefits, and alternatives to endoscopy with the patient. Will confirm consent with the patient and his family tomorrow.  Thank you for allowing me to participate in Gerald Montgomery care. Please call with any questions or concerns.   Thornton Park 02/05/2017, 8:35 PM

## 2017-02-05 NOTE — ED Notes (Signed)
Bladder scan performed. Pt has 200 ml present.

## 2017-02-06 DIAGNOSIS — L8990 Pressure ulcer of unspecified site, unspecified stage: Secondary | ICD-10-CM | POA: Insufficient documentation

## 2017-02-06 LAB — BLOOD CULTURE ID PANEL (REFLEXED)
Acinetobacter baumannii: NOT DETECTED
CANDIDA ALBICANS: NOT DETECTED
CANDIDA GLABRATA: NOT DETECTED
CANDIDA PARAPSILOSIS: NOT DETECTED
CANDIDA TROPICALIS: NOT DETECTED
Candida krusei: NOT DETECTED
ENTEROBACTERIACEAE SPECIES: NOT DETECTED
Enterobacter cloacae complex: NOT DETECTED
Enterococcus species: NOT DETECTED
Escherichia coli: NOT DETECTED
HAEMOPHILUS INFLUENZAE: NOT DETECTED
KLEBSIELLA OXYTOCA: NOT DETECTED
KLEBSIELLA PNEUMONIAE: NOT DETECTED
Listeria monocytogenes: NOT DETECTED
METHICILLIN RESISTANCE: DETECTED — AB
NEISSERIA MENINGITIDIS: NOT DETECTED
Proteus species: NOT DETECTED
Pseudomonas aeruginosa: NOT DETECTED
SERRATIA MARCESCENS: NOT DETECTED
STAPHYLOCOCCUS AUREUS BCID: NOT DETECTED
Staphylococcus species: DETECTED — AB
Streptococcus agalactiae: NOT DETECTED
Streptococcus pneumoniae: NOT DETECTED
Streptococcus pyogenes: NOT DETECTED
Streptococcus species: NOT DETECTED

## 2017-02-06 LAB — HIV ANTIBODY (ROUTINE TESTING W REFLEX): HIV SCREEN 4TH GENERATION: NONREACTIVE

## 2017-02-06 MED ORDER — ZOLPIDEM TARTRATE 5 MG PO TABS
5.0000 mg | ORAL_TABLET | Freq: Every evening | ORAL | Status: DC | PRN
  Administered 2017-02-06: 5 mg via ORAL
  Filled 2017-02-06: qty 1

## 2017-02-06 MED ORDER — VANCOMYCIN HCL IN DEXTROSE 1-5 GM/200ML-% IV SOLN
1000.0000 mg | Freq: Once | INTRAVENOUS | Status: AC
  Administered 2017-02-06: 1000 mg via INTRAVENOUS
  Filled 2017-02-06: qty 200

## 2017-02-06 MED ORDER — MORPHINE SULFATE (PF) 2 MG/ML IV SOLN: 2.0000 mg | INTRAVENOUS | Status: DC | PRN

## 2017-02-06 MED ORDER — BETHANECHOL CHLORIDE 10 MG PO TABS
50.0000 mg | ORAL_TABLET | Freq: Three times a day (TID) | ORAL | Status: DC
  Administered 2017-02-06 – 2017-02-08 (×4): 50 mg via ORAL
  Filled 2017-02-06 (×7): qty 5
  Filled 2017-02-06: qty 2

## 2017-02-06 MED ORDER — VANCOMYCIN HCL IN DEXTROSE 750-5 MG/150ML-% IV SOLN
750.0000 mg | Freq: Two times a day (BID) | INTRAVENOUS | Status: DC
  Administered 2017-02-06 – 2017-02-07 (×2): 750 mg via INTRAVENOUS
  Filled 2017-02-06 (×4): qty 150

## 2017-02-06 NOTE — Progress Notes (Signed)
Called Pharmacy patients face reddened after vancomycin per pharmacist rate should be slowed

## 2017-02-06 NOTE — Progress Notes (Signed)
[place] [facility] [place] NAME: Gerald Montgomery    MR#:  [id number]  DATE OF BIRTH:  April 16, 1956  SUBJECTIVE:  CHIEF COMPLAINT:   Chief Complaint  Patient presents with  . Recurrent UTI   -patient denies any complaints. Has cerebral palsy and bed bound at baseline. Family is at bedside. -No further coffee-ground emesis since admission to Hospital.  REVIEW OF SYSTEMS:  Review of Systems  Constitutional: Negative for chills, fever and malaise/fatigue.  HENT: Negative for ear discharge and hearing loss.   Eyes: Negative for blurred vision and double vision.  Respiratory: Negative for cough, shortness of breath and wheezing.   Cardiovascular: Negative for chest pain and palpitations.  Gastrointestinal: Negative for abdominal pain, constipation, diarrhea, nausea and vomiting.  Genitourinary: Negative for dysuria.  Neurological: Negative for dizziness, seizures and headaches.  Psychiatric/Behavioral: Negative for depression.    DRUG ALLERGIES:   Allergies  Allergen Reactions  . Penicillins     Sickness/sweating  . Sulfa Antibiotics Itching    VITALS:  Blood pressure 108/68, pulse (!) 103, temperature 98.7 F (37.1 C), temperature source Oral, resp. rate 20, height 5\' 8"  (1.727 m), weight 54.4 kg (120 lb), SpO2 96 %.  PHYSICAL EXAMINATION:  Physical Exam  GENERAL:  61 y.o.-year-old patient lying in the bed with no acute distress.  EYES: Pupils equal, round, reactive to light and accommodation. No scleral icterus. Extraocular muscles intact.  HEENT: Head atraumatic, normocephalic. Oropharynx and nasopharynx clear.  NECK:  Supple, no jugular venous distention. No thyroid enlargement, no tenderness.  LUNGS: Normal breath sounds bilaterally, no wheezing, rales,rhonchi or crepitation. No use of accessory muscles of respiration.  CARDIOVASCULAR: S1, S2 normal. No murmurs, rubs, or gallops.  ABDOMEN: Soft, nontender, nondistended. Bowel sounds  present. No organomegaly or mass.  EXTREMITIES: No pedal edema, cyanosis, or clubbing. Spastic lower extremities NEUROLOGIC: Cranial nerves II through XII are intact. Muscle strength 3/5 in both upper extremities, spastic lower extremities. Sensation intact. Gait not checked.  PSYCHIATRIC: The patient is alert, answering simple questions and following commands.  SKIN: No obvious rash, lesion, or ulcer.    LABORATORY PANEL:   CBC  Recent Labs Lab 02/05/17 1114  02/05/17 2105  WBC 17.0*  --   --   HGB 12.2*  < > 10.4*  HCT 36.6*  < > 31.0*  PLT 332  --   --   < > = values in this interval not displayed. ------------------------------------------------------------------------------------------------------------------  Chemistries   Recent Labs Lab 02/05/17 1114  NA 145  K 4.1  CL 111  CO2 23  GLUCOSE 151*  BUN 36*  CREATININE 0.64  CALCIUM 7.8*  AST 53*  ALT 32  ALKPHOS 68  BILITOT 0.6   ------------------------------------------------------------------------------------------------------------------  Cardiac Enzymes No results for input(s): TROPONINI in the last 168 hours. ------------------------------------------------------------------------------------------------------------------  RADIOLOGY:  No results found.  EKG:   Orders placed or performed during the hospital encounter of 02/05/17  . ED EKG 12-Lead  . 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  #1 sepsis-secondary to urinary tract infection. -Heart rate is much controlled now. WBC was elevated on admission. -Follow up urine cultures. Currently on meropenem. -2 out of 4 blood cultures are positive for staphylococcus at this time. Vancomycin started  until final cultures and studies are available  #2 hematemesis-coffee-ground emesis while on Xarelto. Xarelto is on hold. -Drop in hemoglobin noted. No indication for transfusion at this time. No further coffee ground emesis noted. -Appreciate GI consult. Started on clear liquid diet. If stable, endoscopy on Monday.  -Discussed with family at bedside. Started on Protonix  #3 cerebral palsy with chronic spasms-monitor  #4 depression-on Abilify  #5 neurogenic bladder-on bethanechol  #6 DVT prophylaxis-hold off blood thinners due to GI bleed     All the records are reviewed and case discussed with Care Management/Social Workerr. Management plans discussed with the patient, family and they are in agreement.  CODE STATUS: full code  TOTAL TIME TAKING CARE OF THIS PATIENT: 38 minutes.   POSSIBLE D/C IN 2-3 DAYS, DEPENDING ON CLINICAL CONDITION.   Gladstone Lighter M.D on 02/06/2017 at 11:37 AM  Between 7am to 6pm - Pager - [phone]  After 6pm go to [email or web address] - password [email or web address] [facility]  Office  [phone]  CC: Primary care physician; Roselee Nova, MD

## 2017-02-06 NOTE — Progress Notes (Signed)
Pharmacy Antibiotic Note  Gerald Montgomery is a 61 y.o. male admitted on 02/05/2017 with bacteremia.  Pharmacy has been consulted for Vancomycin dosing.  Plan: BCID resulted as 2 of 4 bottles (aerobic and anaerobic) with staph species that was mecA positive. Results were discussed with Dr. Tressia Miners, and pharmacy was consulted to initiate vancomycin dosing. Patient received one dose of vancomycin 1000mg  in the ED and had Redman Syndrome. Pharmacy advised nursing to give over slower rate. Patient is also on meropenem for suspected UTI.   ke = 0.067  T1/2 = 10.34 hours Vd = 38 Cmin = 16.65 Stack dose ~7 hours  9/14 Received one dose of Vancomycin 1000 mg once. Will start vancomycin 1250 mg Q12H this evening. Pharmacy will continue to monitor and adjust as needed.  Vancomycin trough ordered prior to fourth dose.   Height: 5\' 8"  (172.7 cm) Weight: 120 lb (54.4 kg) IBW/kg (Calculated) : 68.4  Temp (24hrs), Avg:98.1 F (36.7 C), Min:97.5 F (36.4 C), Max:98.7 F (37.1 C)   Recent Labs Lab 02/05/17 1114 02/05/17 1119 02/05/17 1446  WBC 17.0*  --   --   CREATININE 0.64  --   --   LATICACIDVEN  --  4.4* 2.5*    Estimated Creatinine Clearance: 75.6 mL/min (by C-G formula based on SCr of 0.64 mg/dL).    Allergies  Allergen Reactions  . Penicillins     Sickness/sweating  . Sulfa Antibiotics Itching   Results for orders placed or performed during the hospital encounter of 02/05/17  Blood Culture (routine x 2)     Status: None (Preliminary result)   Collection Time: 02/05/17 12:40 PM  Result Value Ref Range Status   Specimen Description BLOOD BLOOD RIGHT HAND  Final   Special Requests   Final    BOTTLES DRAWN AEROBIC AND ANAEROBIC Blood Culture adequate volume   Culture NO GROWTH < 24 HOURS  Final   Report Status PENDING  Incomplete  Blood Culture (routine x 2)     Status: None (Preliminary result)   Collection Time: 02/05/17 12:40 PM  Result Value Ref Range Status   Specimen  Description BLOOD LEFT ANTECUBITAL  Final   Special Requests   Final    BOTTLES DRAWN AEROBIC AND ANAEROBIC Blood Culture adequate volume   Culture  Setup Time   Final    GRAM POSITIVE COCCI IN BOTH AEROBIC AND ANAEROBIC BOTTLES CRITICAL RESULT CALLED TO, READ BACK BY AND VERIFIED WITH: Azhar Yogi ON 02/06/17 AT 1040 QSD    Culture GRAM POSITIVE COCCI  Final   Report Status PENDING  Incomplete  Blood Culture ID Panel (Reflexed)     Status: Abnormal   Collection Time: 02/05/17 12:40 PM  Result Value Ref Range Status   Enterococcus species NOT DETECTED NOT DETECTED Final   Listeria monocytogenes NOT DETECTED NOT DETECTED Final   Staphylococcus species DETECTED (A) NOT DETECTED Final    Comment: Methicillin (oxacillin) resistant coagulase negative staphylococcus. Possible blood culture contaminant (unless isolated from more than one blood culture draw or clinical case suggests pathogenicity). No antibiotic treatment is indicated for blood  culture contaminants. CRITICAL RESULT CALLED TO, READ BACK BY AND VERIFIED WITH: Yana Schorr ON 02/06/17 AT 1040 QSD    Staphylococcus aureus NOT DETECTED NOT DETECTED Final   Methicillin resistance DETECTED (A) NOT DETECTED Final    Comment: CRITICAL RESULT CALLED TO, READ BACK BY AND VERIFIED WITH: Antar Milks ON 02/06/17 AT 1040 QSD    Streptococcus species NOT DETECTED NOT DETECTED  Final   Streptococcus agalactiae NOT DETECTED NOT DETECTED Final   Streptococcus pneumoniae NOT DETECTED NOT DETECTED Final   Streptococcus pyogenes NOT DETECTED NOT DETECTED Final   Acinetobacter baumannii NOT DETECTED NOT DETECTED Final   Enterobacteriaceae species NOT DETECTED NOT DETECTED Final   Enterobacter cloacae complex NOT DETECTED NOT DETECTED Final   Escherichia coli NOT DETECTED NOT DETECTED Final   Klebsiella oxytoca NOT DETECTED NOT DETECTED Final   Klebsiella pneumoniae NOT DETECTED NOT DETECTED Final   Proteus species NOT DETECTED NOT DETECTED  Final   Serratia marcescens NOT DETECTED NOT DETECTED Final   Haemophilus influenzae NOT DETECTED NOT DETECTED Final   Neisseria meningitidis NOT DETECTED NOT DETECTED Final   Pseudomonas aeruginosa NOT DETECTED NOT DETECTED Final   Candida albicans NOT DETECTED NOT DETECTED Final   Candida glabrata NOT DETECTED NOT DETECTED Final   Candida krusei NOT DETECTED NOT DETECTED Final   Candida parapsilosis NOT DETECTED NOT DETECTED Final   Candida tropicalis NOT DETECTED NOT DETECTED Final    Antimicrobials this admission: Meropenem 9/14>>  Vancomycin 9/15>>   Levofloxacin x 1 dose  Dose adjustments this admission:   Microbiology results: BCx: 9/15>> staph species, mecA positive UCx: 9/14>> Sputum: 9/14>>   Thank you for allowing pharmacy to be a part of this patient's care.  Lendon Ka, PharmD Pharmacy Resident 02/06/2017 2:01 PM

## 2017-02-06 NOTE — Progress Notes (Signed)
Subjective: Since I last evaluated the patient he has had no further hematemesis. No significant BM since hospitalized.  GI ROS is negative. His mother and Dr. Tressia Miners were present at the bedside at the time of my evaluation.   Objective: Vital signs in last 24 hours: Temp:  [97.5 F (36.4 C)-98.6 F (37 C)] 97.5 F (36.4 C) (09/15 0543) Pulse Rate:  [103-128] 103 (09/15 0543) Resp:  [18-27] 20 (09/15 0543) BP: (111-126)/(65-94) 114/65 (09/15 0543) SpO2:  [97 %-100 %] 97 % (09/15 0543) Weight:  [120 lb (54.4 kg)] 120 lb (54.4 kg) (09/14 1102) Last BM Date: 02/05/17  Intake/Output from previous day: 09/14 0701 - 09/15 0700 In: 100 [IV Piggyback:100] Out: 700 [Urine:700] Intake/Output this shift: No intake/output data recorded.  Constitutional: He is oriented to person, place, and time. He appears well-nourished. No distress.  Chronically ill appearing. Sitting with an emesis bag. There is brown, thin emesis in the bag and in his mouth.   Cardiovascular: Normal rate and regular rhythm.   Respiratory: Effort normal and breath sounds normal.  GI: Soft. Bowel sounds are normal. He exhibits no distension. There is no tenderness. There is no rebound and no guarding. .  Skin: Skin is dry. No rash noted. No erythema.  Psychiatric: He has a normal mood and affect. His behavior is normal.   Lab Results:  Recent Labs  02/05/17 1114 02/05/17 1518 02/05/17 2105  WBC 17.0*  --   --   HGB 12.2* 11.4* 10.4*  HCT 36.6* 33.7* 31.0*  PLT 332  --   --    BMET  Recent Labs  02/05/17 1114  NA 145  K 4.1  CL 111  CO2 23  GLUCOSE 151*  BUN 36*  CREATININE 0.64  CALCIUM 7.8*   LFT  Recent Labs  02/05/17 1114  PROT 6.4*  ALBUMIN 3.3*  AST 53*  ALT 32  ALKPHOS 68  BILITOT 0.6   PT/INR No results for input(s): LABPROT, INR in the last 72 hours. Hepatitis Panel No results for input(s): HEPBSAG, HCVAB, HEPAIGM, HEPBIGM in the last 72 hours. C-Diff No results for input(s):  CDIFFTOX in the last 72 hours. No results for input(s): CDIFFPCR in the last 72 hours. Fecal Lactopherrin No results for input(s): FECLLACTOFRN in the last 72 hours.  Studies/Results: No results found.  Medications:  I have reviewed the patient's current medications. Prior to Admission:  Prescriptions Prior to Admission  Medication Sig Dispense Refill Last Dose  . ARIPiprazole (ABILIFY) 15 MG tablet TAKE 1 TABLET(15 MG) BY MOUTH DAILY 90 tablet 1 Past Week at Unknown time  . rivaroxaban (XARELTO) 10 MG TABS tablet TAKE 1 TABLET(10 MG) BY MOUTH DAILY 90 tablet 1 02/04/2017 at 1800  . bethanechol (URECHOLINE) 50 MG tablet TAKE 1 TABLET(50 MG) BY MOUTH THREE TIMES DAILY (Patient not taking: Reported on 02/05/2017) 270 tablet 1 Completed Course at Unknown time  . nitrofurantoin, macrocrystal-monohydrate, (MACROBID) 100 MG capsule Take 1 capsule (100 mg total) by mouth 2 (two) times daily. (Patient not taking: Reported on 02/05/2017) 14 capsule 0 Completed Course at Unknown time   Scheduled: . ARIPiprazole  15 mg Oral Daily  . bethanechol  50 mg Oral TID  . Influenza vac split quadrivalent PF  0.5 mL Intramuscular Tomorrow-1000  . mouth rinse  15 mL Mouth Rinse BID  . pantoprazole (PROTONIX) IV  40 mg Intravenous Q12H   Continuous: . meropenem (MERREM) IV Stopped (02/06/17 1211)  . vancomycin 1,000 mg (02/06/17 1209)  [id number] **OR** acetaminophen, morphine injection, ondansetron **OR** ondansetron (ZOFRAN) IV  Assessment/Plan: Coffee ground emesis UTI with associated sepsis including tachycardia Elevated BUN at 36 Anemia - hgb 11.4 (Hgb 12.2 02/05/17) Xarelto for history of DVT, last dose 02/05/17 No prior endoscopy or GI bleeding No prior colon cancer screening Cerebral palsy with chronic spasms Neurogenic bladder Depression  Slight decline in hemoglobin overnight but a transfusion is not indicated. No overt GI blood loss.  Remains slightly tachycardic. Continue PPI BID.  Serial hgb/hct with transfusion if indicated.  Clear liquid diet today (no reds). If he remains stable, endoscopy on Monday.   Outpatient screening colonoscopy was recommended to the patient and his family.   LOS: 1 day   Thornton Park 02/06/2017, 9:31 AM

## 2017-02-07 ENCOUNTER — Encounter: Payer: Self-pay | Admitting: *Deleted

## 2017-02-07 ENCOUNTER — Inpatient Hospital Stay: Payer: Medicare Other | Admitting: Anesthesiology

## 2017-02-07 ENCOUNTER — Encounter
Admission: EM | Disposition: A | Discharge status: Home / Self Care | Payer: Self-pay | Source: Home / Self Care | Attending: Internal Medicine

## 2017-02-07 HISTORY — PX: ESOPHAGOGASTRODUODENOSCOPY: SHX5428

## 2017-02-07 LAB — BASIC METABOLIC PANEL
ANION GAP: 4 — AB (ref 5–15)
BUN: 10 mg/dL (ref 6–20)
CALCIUM: 7.8 mg/dL — AB (ref 8.9–10.3)
CO2: 27 mmol/L (ref 22–32)
Chloride: 109 mmol/L (ref 101–111)
Creatinine, Ser: 0.46 mg/dL — ABNORMAL LOW (ref 0.61–1.24)
GFR calc Af Amer: >60 mL/min (ref >60)
Glucose, Bld: 84 mg/dL (ref 65–99)
POTASSIUM: 3.6 mmol/L (ref 3.5–5.1)
Sodium: 140 mmol/L (ref 135–145)

## 2017-02-07 LAB — CBC
HEMATOCRIT: 25.4 % — AB (ref 40.0–52.0)
HEMOGLOBIN: 8.7 g/dL — AB (ref 13.0–18.0)
MCH: 28.2 pg (ref 26.0–34.0)
MCHC: 34.2 g/dL (ref 32.0–36.0)
MCV: 82.6 fL (ref 80.0–100.0)
PLATELETS: 197 10*3/uL (ref 150–440)
RBC: 3.08 MIL/uL — AB (ref 4.40–5.90)
RDW: 14.3 % (ref 11.5–14.5)
WBC: 4.5 10*3/uL (ref 3.8–10.6)

## 2017-02-07 LAB — VANCOMYCIN, TROUGH: VANCOMYCIN TR: 13 ug/mL — AB (ref 15–20)

## 2017-02-07 SURGERY — EGD (ESOPHAGOGASTRODUODENOSCOPY)
Anesthesia: General | Laterality: Left

## 2017-02-07 MED ORDER — FENTANYL CITRATE (PF) 100 MCG/2ML IJ SOLN
INTRAMUSCULAR | Status: DC | PRN
  Administered 2017-02-07: 25 ug via INTRAVENOUS

## 2017-02-07 MED ORDER — VANCOMYCIN HCL IN DEXTROSE 1-5 GM/200ML-% IV SOLN
1000.0000 mg | Freq: Two times a day (BID) | INTRAVENOUS | Status: DC
  Administered 2017-02-07 – 2017-02-08 (×2): 1000 mg via INTRAVENOUS
  Filled 2017-02-07 (×3): qty 200

## 2017-02-07 MED ORDER — GLYCOPYRROLATE 0.2 MG/ML IJ SOLN
INTRAMUSCULAR | Status: AC
  Filled 2017-02-07: qty 1

## 2017-02-07 MED ORDER — PROPOFOL 500 MG/50ML IV EMUL
INTRAVENOUS | Status: AC
  Filled 2017-02-07: qty 50

## 2017-02-07 MED ORDER — PROPOFOL 500 MG/50ML IV EMUL
INTRAVENOUS | Status: DC | PRN
  Administered 2017-02-07: 140 ug/kg/min via INTRAVENOUS

## 2017-02-07 MED ORDER — FENTANYL CITRATE (PF) 100 MCG/2ML IJ SOLN
INTRAMUSCULAR | Status: AC
  Filled 2017-02-07: qty 2

## 2017-02-07 MED ORDER — LIDOCAINE HCL (CARDIAC) 20 MG/ML IV SOLN
INTRAVENOUS | Status: DC | PRN
  Administered 2017-02-07: 40 mg via INTRAVENOUS

## 2017-02-07 MED ORDER — GLYCOPYRROLATE 0.2 MG/ML IJ SOLN
INTRAMUSCULAR | Status: DC | PRN
  Administered 2017-02-07: .2 mg via INTRAVENOUS

## 2017-02-07 MED ORDER — SODIUM CHLORIDE 0.9% FLUSH
3.0000 mL | INTRAVENOUS | Status: DC | PRN
  Administered 2017-02-07: 3 mL via INTRAVENOUS

## 2017-02-07 MED ORDER — PROPOFOL 10 MG/ML IV BOLUS
INTRAVENOUS | Status: DC | PRN
  Administered 2017-02-07: 60 mg via INTRAVENOUS

## 2017-02-07 MED ORDER — LIDOCAINE HCL (PF) 2 % IJ SOLN
INTRAMUSCULAR | Status: AC
  Filled 2017-02-07: qty 2

## 2017-02-07 MED ORDER — SODIUM CHLORIDE 0.9 % IV SOLN
INTRAVENOUS | Status: DC
  Administered 2017-02-07: 10:00:00 via INTRAVENOUS

## 2017-02-07 NOTE — Progress Notes (Signed)
Pharmacy Antibiotic Note  Gerald Montgomery is a 61 y.o. male admitted on 02/05/2017 with bacteremia.  Pharmacy has been consulted for Vancomycin dosing.  Plan: BCID resulted as 2 of 4 bottles (aerobic and anaerobic) with staph species that was mecA positive. Results were discussed with Dr. Tressia Miners, and pharmacy was consulted to initiate vancomycin dosing. Patient received one dose of vancomycin 1000mg  in the ED and had Redman Syndrome. Pharmacy advised nursing to give over slower rate. Patient is also on meropenem for suspected UTI.   9/14 Received one dose of Vancomycin 1000 mg once. Will start vancomycin 750 mg Q12H this evening. Pharmacy will continue to monitor and adjust as needed.  Vancomycin trough ordered prior to fourth dose.   9/16 Trough level  13. Will increase infusion to Vancomycin 1000mg  IV every 12 hours. Infusion ordered to be given over 120 minutes. Will order trough level prior to 4th dose. Calculated trough at Css is 17.2   Ke: 0.082    T1/2: 8.6     VD: 37.8   Height: 5\' 8"  (172.7 cm) Weight: 120 lb (54.4 kg) IBW/kg (Calculated) : 68.4  Temp (24hrs), Avg:98.1 F (36.7 C), Min:97.5 F (36.4 C), Max:98.9 F (37.2 C)   Recent Labs Lab 02/05/17 1114 02/05/17 1119 02/05/17 1446 02/07/17 0549 02/07/17 1817  WBC 17.0*  --   --  4.5  --   CREATININE 0.64  --   --  0.46*  --   LATICACIDVEN  --  4.4* 2.5*  --   --   VANCOTROUGH  --   --   --   --  13*    Estimated Creatinine Clearance: 75.6 mL/min (A) (by C-G formula based on SCr of 0.46 mg/dL (L)).    Allergies  Allergen Reactions  . Penicillins     Sickness/sweating  . Sulfa Antibiotics Itching   Results for orders placed or performed during the hospital encounter of 02/05/17  Urine culture     Status: Abnormal (Preliminary result)   Collection Time: 02/05/17 11:14 AM  Result Value Ref Range Status   Specimen Description URINE, RANDOM  Final   Special Requests NONE  Final   Culture (A)  Final     >=100,000 COLONIES/mL ESCHERICHIA COLI CULTURE REINCUBATED FOR BETTER GROWTH Performed at [facility], [address] [place], [place] [address]    Report Status PENDING  Incomplete   Organism ID, Bacteria ESCHERICHIA COLI (A)  Final      Susceptibility   Escherichia coli - MIC*    AMPICILLIN 4 SENSITIVE Sensitive     CEFAZOLIN <=4 SENSITIVE Sensitive     CEFTRIAXONE <=1 SENSITIVE Sensitive     CIPROFLOXACIN <=0.25 SENSITIVE Sensitive     GENTAMICIN <=1 SENSITIVE Sensitive     IMIPENEM <=0.25 SENSITIVE Sensitive     NITROFURANTOIN <=16 SENSITIVE Sensitive     TRIMETH/SULFA <=20 SENSITIVE Sensitive     AMPICILLIN/SULBACTAM <=2 SENSITIVE Sensitive     PIP/TAZO <=4 SENSITIVE Sensitive     Extended ESBL NEGATIVE Sensitive     * >=100,000 COLONIES/mL ESCHERICHIA COLI  Blood Culture (routine x 2)     Status: None (Preliminary result)   Collection Time: 02/05/17 12:40 PM  Result Value Ref Range Status   Specimen Description BLOOD BLOOD RIGHT HAND  Final   Special Requests   Final    BOTTLES DRAWN AEROBIC AND ANAEROBIC Blood Culture adequate volume   Culture NO GROWTH 2 DAYS  Final   Report Status PENDING  Incomplete  Blood Culture (routine x 2)     Status: Abnormal (Preliminary result)   Collection Time: 02/05/17 12:40 PM  Result Value Ref Range Status   Specimen Description BLOOD LEFT ANTECUBITAL  Final   Special Requests   Final    BOTTLES DRAWN AEROBIC AND ANAEROBIC Blood Culture adequate volume   Culture  Setup Time   Final    GRAM POSITIVE COCCI IN BOTH AEROBIC AND ANAEROBIC BOTTLES CRITICAL RESULT CALLED TO, READ BACK BY AND VERIFIED WITH: HANNAH LIFSEY ON 02/06/17 AT 1040 QSD    Culture (A)  Final    STAPHYLOCOCCUS SPECIES (COAGULASE NEGATIVE) THE SIGNIFICANCE OF ISOLATING THIS ORGANISM FROM A SINGLE SET OF BLOOD CULTURES WHEN MULTIPLE SETS ARE DRAWN IS UNCERTAIN. PLEASE NOTIFY THE MICROBIOLOGY DEPARTMENT WITHIN ONE WEEK IF SPECIATION AND SENSITIVITIES ARE  REQUIRED. Performed at [facility], [place] [address], [place], [place] [address]    Report Status PENDING  Incomplete  Blood Culture ID Panel (Reflexed)     Status: Abnormal   Collection Time: 02/05/17 12:40 PM  Result Value Ref Range Status   Enterococcus species NOT DETECTED NOT DETECTED Final   Listeria monocytogenes NOT DETECTED NOT DETECTED Final   Staphylococcus species DETECTED (A) NOT DETECTED Final    Comment: Methicillin (oxacillin) resistant coagulase negative staphylococcus. Possible blood culture contaminant (unless isolated from more than one blood culture draw or clinical case suggests pathogenicity). No antibiotic treatment is indicated for blood  culture contaminants. CRITICAL RESULT CALLED TO, READ BACK BY AND VERIFIED WITH: HANNAH LIFSEY ON 02/06/17 AT 1040 QSD    Staphylococcus aureus NOT DETECTED NOT DETECTED Final   Methicillin resistance DETECTED (A) NOT DETECTED Final    Comment: CRITICAL RESULT CALLED TO, READ BACK BY AND VERIFIED WITH: HANNAH LIFSEY ON 02/06/17 AT 1040 QSD    Streptococcus species NOT DETECTED NOT DETECTED Final   Streptococcus agalactiae NOT DETECTED NOT DETECTED Final   Streptococcus pneumoniae NOT DETECTED NOT DETECTED Final   Streptococcus pyogenes NOT DETECTED NOT DETECTED Final   Acinetobacter baumannii NOT DETECTED NOT DETECTED Final   Enterobacteriaceae species NOT DETECTED NOT DETECTED Final   Enterobacter cloacae complex NOT DETECTED NOT DETECTED Final   Escherichia coli NOT DETECTED NOT DETECTED Final   Klebsiella oxytoca NOT DETECTED NOT DETECTED Final   Klebsiella pneumoniae NOT DETECTED NOT DETECTED Final   Proteus species NOT DETECTED NOT DETECTED Final   Serratia marcescens NOT DETECTED NOT DETECTED Final   Haemophilus influenzae NOT DETECTED NOT DETECTED Final   Neisseria meningitidis NOT DETECTED NOT DETECTED Final   Pseudomonas aeruginosa NOT DETECTED NOT DETECTED Final   Candida albicans NOT DETECTED NOT DETECTED  Final   Candida glabrata NOT DETECTED NOT DETECTED Final   Candida krusei NOT DETECTED NOT DETECTED Final   Candida parapsilosis NOT DETECTED NOT DETECTED Final   Candida tropicalis NOT DETECTED NOT DETECTED Final    Antimicrobials this admission: Meropenem 9/14>>  Vancomycin 9/15>>   Levofloxacin x 1 dose  Dose adjustments this admission:   Microbiology results: BCx: 9/15>> staph species, mecA positive UCx: 9/14>> Sputum: 9/14>>   Thank you for allowing pharmacy to be a part of this patient's care.  Pernell Dupre, PharmD, BCPS Clinical Pharmacist 02/07/2017 7:20 PM

## 2017-02-07 NOTE — Progress Notes (Signed)
Subjective: Since I last evaluated the patient he has had progressive anemia. Tolerated clear liquids yesterday.  No overt or occult GI blood loss.  No stool. No family present at the bedside today. He reports being hungry. GI ROS is otherwise negative.  Objective: Vital signs in last 24 hours: Temp:  [97.8 F (36.6 C)-98.9 F (37.2 C)] 98.9 F (37.2 C) (09/16 0441) Pulse Rate:  [86-103] 88 (09/16 0443) Resp:  [18-19] 19 (09/16 0441) BP: (85-110)/(54-68) 90/58 (09/16 0443) SpO2:  [96 %-99 %] 99 % (09/16 0441) Last BM Date: 02/05/17  Intake/Output from previous day: 09/15 0701 - 09/16 0700 In: 1036 [P.O.:780; IV Piggyback:256] Out: -  Intake/Output this shift: Total I/O In: 256 [IV Piggyback:256] Out: -   Constitutional: Well-nourished. No distress.  Chronically ill appearing. No blood in the oropharynx this morning. Cardiovascular: Normal rateand regular rhythm.  Respiratory: Effort normaland breath sounds normal.  GI: Soft. Bowel sounds are normal. He exhibits no distension. There is no tenderness. There is no reboundand no guarding. .  Skin: Skin is dry. No rashnoted. No erythema.  Psychiatric: He has a normal mood and affect. His behavior is normal.   Lab Results:  Recent Labs  02/05/17 1114 02/05/17 1518 02/05/17 2105 02/07/17 0549  WBC 17.0*  --   --  4.5  HGB 12.2* 11.4* 10.4* 8.7*  HCT 36.6* 33.7* 31.0* 25.4*  PLT 332  --   --  197   BMET  Recent Labs  02/05/17 1114  NA 145  K 4.1  CL 111  CO2 23  GLUCOSE 151*  BUN 36*  CREATININE 0.64  CALCIUM 7.8*   LFT  Recent Labs  02/05/17 1114  PROT 6.4*  ALBUMIN 3.3*  AST 53*  ALT 32  ALKPHOS 68  BILITOT 0.6   PT/INR No results for input(s): LABPROT, INR in the last 72 hours. Hepatitis Panel No results for input(s): HEPBSAG, HCVAB, HEPAIGM, HEPBIGM in the last 72 hours. C-Diff No results for input(s): CDIFFTOX in the last 72 hours. No results for input(s): CDIFFPCR in the last 72  hours. Fecal Lactopherrin No results for input(s): FECLLACTOFRN in the last 72 hours.  Studies/Results: No results found.  Medications:  I have reviewed the patient's current medications. Prior to Admission:  Prescriptions Prior to Admission  Medication Sig Dispense Refill Last Dose  . ARIPiprazole (ABILIFY) 15 MG tablet TAKE 1 TABLET(15 MG) BY MOUTH DAILY 90 tablet 1 Past Week at Unknown time  . rivaroxaban (XARELTO) 10 MG TABS tablet TAKE 1 TABLET(10 MG) BY MOUTH DAILY 90 tablet 1 02/04/2017 at 1800  . bethanechol (URECHOLINE) 50 MG tablet TAKE 1 TABLET(50 MG) BY MOUTH THREE TIMES DAILY (Patient not taking: Reported on 02/05/2017) 270 tablet 1 Completed Course at Unknown time  . nitrofurantoin, macrocrystal-monohydrate, (MACROBID) 100 MG capsule Take 1 capsule (100 mg total) by mouth 2 (two) times daily. (Patient not taking: Reported on 02/05/2017) 14 capsule 0 Completed Course at Unknown time   Scheduled: . ARIPiprazole  15 mg Oral Daily  . bethanechol  50 mg Oral TID  . mouth rinse  15 mL Mouth Rinse BID  . pantoprazole (PROTONIX) IV  40 mg Intravenous Q12H   Continuous: . sodium chloride    . meropenem (MERREM) IV Stopped (02/07/17 0603)  . vancomycin 750 mg (02/07/17 0800)   SAY:[id number] **OR** acetaminophen, morphine injection, ondansetron **OR** ondansetron (ZOFRAN) IV, zolpidem  Assessment/Plan: Coffee ground emesis UTI with associated sepsis Persistently elevated BUN at 36 Progressive anemia - hgb now  8.7 Xarelto for history of DVT, last dose 02/05/17 No prior endoscopy or GI bleeding No prior colon cancer screening    - needs to be scheduled as routine study as outpatient Cerebral palsy with chronic spasms Neurogenic bladder Depression  Progressive anemia. No overt GI blood loss. No longer tachycardic. Continue Protonix Serial hgb/hct with transfusion if indicated.    Will proceed with EGD today. He has been off Xarelto since 02/05/17. His mother and sister  were consented at the bedside yesterday after we discussed the risks, benefits, and alternatives to endoscopic evaluation. .    LOS: 2 days   Thornton Park 02/07/2017, 6:47 AM

## 2017-02-07 NOTE — Transfer of Care (Signed)
Immediate Anesthesia Transfer of Care Note  Patient: Gerald Montgomery  Procedure(s) Performed: Procedure(s): ESOPHAGOGASTRODUODENOSCOPY (EGD) (Left)  Patient Location: PACU  Anesthesia Type:General  Level of Consciousness: awake, alert  and oriented  Airway & Oxygen Therapy: Patient Spontanous Breathing and Patient connected to nasal cannula oxygen  Post-op Assessment: Report given to RN and Post -op Vital signs reviewed and stable  Post vital signs: Reviewed and stable  Last Vitals:  Vitals:   02/07/17 0443 02/07/17 1033  BP: (!) 90/58 110/75  Pulse: 88 (!) 110  Resp:  12  Temp:  (!) 36.4 C  SpO2:  99%    Last Pain:  Vitals:   02/07/17 1033  TempSrc: Temporal  PainSc:       Patients Stated Pain Goal: 0 (15/83/09 4076)  Complications: No apparent anesthesia complications

## 2017-02-07 NOTE — Progress Notes (Signed)
Pt back from EGD- only showed gastritis. VSS. Changed & repositioned in bed. Clear liquids tray ordered (will advance as tolerated per order). Will continue to monitor.

## 2017-02-07 NOTE — Anesthesia Preprocedure Evaluation (Signed)
Anesthesia Evaluation  Patient identified by MRN, date of birth, ID band Patient awake    Reviewed: Allergy & Precautions, H&P , NPO status , Patient's Chart, lab work & pertinent test results  History of Anesthesia Complications Negative for: history of anesthetic complications  Airway Mallampati: III  TM Distance: >3 FB Neck ROM: full    Dental  (+) Poor Dentition, Chipped, Missing   Pulmonary neg shortness of breath, pneumonia,           Cardiovascular Exercise Tolerance: Poor      Neuro/Psych PSYCHIATRIC DISORDERS Depression negative neurological ROS  negative psych ROS   GI/Hepatic negative GI ROS, Neg liver ROS, neg GERD  ,  Endo/Other  negative endocrine ROS  Renal/GU negative Renal ROS  negative genitourinary   Musculoskeletal   Abdominal   Peds  Hematology negative hematology ROS (+)   Anesthesia Other Findings 61 y.o. male with a known history of cerebral palsy, neurogenic bladder and chronic lower extremity DVTs   Past Medical History: No date: Depression No date: History of DVT of lower extremity No date: Mood disorder (HCC) No date: Neurogenic bladder No date: Urinary incontinence  Past Surgical History: 2013: filter surgery for blood clot 2013: HIP FRACTURE SURGERY  BMI    Body Mass Index:  18.25 kg/m      Reproductive/Obstetrics negative OB ROS                             Anesthesia Physical Anesthesia Plan  ASA: III  Anesthesia Plan: General   Post-op Pain Management:    Induction: Intravenous  PONV Risk Score and Plan: Propofol infusion  Airway Management Planned: Natural Airway and Nasal Cannula  Additional Equipment:   Intra-op Plan:   Post-operative Plan:   Informed Consent: I have reviewed the patients History and Physical, chart, labs and discussed the procedure including the risks, benefits and alternatives for the proposed anesthesia  with the patient or authorized representative who has indicated his/her understanding and acceptance.   Dental Advisory Given  Plan Discussed with: Anesthesiologist, CRNA and Surgeon  Anesthesia Plan Comments: (Consent via patient and sister  They were consented for risks of anesthesia including but not limited to:  - adverse reactions to medications - risk of intubation if required - damage to teeth, lips or other oral mucosa - sore throat or hoarseness - Damage to heart, brain, lungs or loss of life  They voiced understanding.)        Anesthesia Quick Evaluation

## 2017-02-07 NOTE — Progress Notes (Signed)
Brief EGD procedure note  EGD performed 02/07/17 at 10:30 Endoscopist: Thornton Park, MD Assistant: None  Procedure note and photographs recorded in Provation however I am unable to sign the note this after. Awaiting assistance from IT.  Indications: Hematemesis, GI blood loss anemia Posteroperative Findings: LA Class D reflux esophagitis, otherwise normal exam. No blood present.  Medications: Monitored anesthesia Complications: None Blood loss: <5cc Recommendations: PPI BID, resume diet, reflux lifestyle modifications, resume Xarelto tomorrow

## 2017-02-07 NOTE — Anesthesia Postprocedure Evaluation (Signed)
Anesthesia Post Note  Patient: Gerald Montgomery  Procedure(s) Performed: Procedure(s) (LRB): ESOPHAGOGASTRODUODENOSCOPY (EGD) (Left)  Patient location during evaluation: PACU Anesthesia Type: General Level of consciousness: awake and alert Pain management: pain level controlled Vital Signs Assessment: post-procedure vital signs reviewed and stable Respiratory status: spontaneous breathing, nonlabored ventilation, respiratory function stable and patient connected to nasal cannula oxygen Cardiovascular status: blood pressure returned to baseline and stable Postop Assessment: no apparent nausea or vomiting Anesthetic complications: no     Last Vitals:  Vitals:   02/07/17 1103 02/07/17 1115  BP: 116/77 120/82  Pulse: 97 (!) 104  Resp: 18 20  Temp:    SpO2: 98% 98%    Last Pain:  Vitals:   02/07/17 1033  TempSrc: Temporal  PainSc:                  Precious Haws Piscitello

## 2017-02-07 NOTE — Anesthesia Post-op Follow-up Note (Signed)
Anesthesia QCDR form completed.        

## 2017-02-07 NOTE — Op Note (Addendum)
[facility] Gastroenterology Patient Name: Gerald Montgomery Procedure Date: 02/07/2017 9:36 AM MRN: [id number] Account #: [id number] Date of Birth: Sep 25, 1955 Admit Type: Inpatient Age: 61 Room: [facility] ENDO ROOM 2 Gender: Male Note Status: Finalized Procedure:            Upper GI endoscopy Indications:          Recent gastrointestinal bleeding. Coffee ground emesis                        02/05/17. Progressive GI blood loss anemia since that                        time. No localizing symptoms. Providers:            Thornton Park MD, MD Referring MD:         Inpatient hospitalist team Medicines:            Monitored Anesthesia Care Complications:        No immediate complications. Procedure:            Pre-Anesthesia Assessment:                       - Prior to the procedure, a History and Physical was                        performed, and patient medications and allergies were                        reviewed. The patient is unable to give consent                        secondary to the patient's altered mental status. The                        risks and benefits of the procedure and the sedation                        options and risks were discussed with the patient's                        mother and sister. All questions were answered and                        informed consent was obtained. Patient identification                        and proposed procedure were verified by the physician                        and the nurse in the pre-procedure area. Mental Status                        Examination: alert but confused. Airway Examination:                        Mallampati Class II (the uvula but not tonsillar                        pillars visualized). Respiratory Examination: clear to  auscultation. CV Examination: normal. Prophylactic                        Antibiotics: The patient does not require prophylactic   antibiotics. Prior Anticoagulants: The patient has                        taken Xarelto (rivaroxaban), last dose was 2 days prior                        to procedure. ASA Grade Assessment: III - A patient                        with severe systemic disease. After reviewing the risks                        and benefits, the patient was deemed in satisfactory                        condition to undergo the procedure. The anesthesia plan                        was to use monitored anesthesia care (MAC). Immediately                        prior to administration of medications, the patient was                        re-assessed for adequacy to receive sedatives. The                        heart rate, respiratory rate, oxygen saturations, blood                        pressure, adequacy of pulmonary ventilation, and                        response to care were monitored throughout the                        procedure. The physical status of the patient was                        re-assessed after the procedure.                       After obtaining informed consent, the endoscope was                        passed under direct vision. Throughout the procedure,                        the patient's blood pressure, pulse, and oxygen                        saturations were monitored continuously. The                        Colonoscope was introduced through the mouth, and  advanced to the second part of duodenum. The upper GI                        endoscopy was accomplished without difficulty. The                        patient tolerated the procedure well. Findings:      LA Grade D (one or more mucosal breaks involving at least 75% of       esophageal circumference) esophagitis with no bleeding was found.       Biopsies were taken with a cold forceps for histology.      The entire examined stomach was normal. No mucosal abnormalities. No       blood seen.      The examined  duodenum was normal. No mucosal abnormalities. No blood       seen. Impression:           - LA Grade D reflux esophagitis. Biopsied.                       - Normal stomach.                       - Normal examined duodenum. Recommendation:       - Advance diet as tolerated.                       - Await pathology results.                       - Follow an antireflux regimen and lifestyle                        modifications. Keep the head of the bed elevated.                       - Use Protonix (pantoprazole) 40 mg PO BID for 8 weeks.                       - Perform a colonoscopy (date not yet determined) as an                        outpatient for routine colon cancer screening. He has                        not had any colon cancer screening.                       - Resume Xarelto (rivaroxaban) at prior dose tomorrow. Procedure Code(s):    --- Professional ---                       [phone], Esophagogastroduodenoscopy, flexible, transoral;                        with biopsy, single or multiple CPT copyright 2016 American [employer]. All rights reserved. The codes documented in this report are preliminary and upon coder review may  be revised to meet current compliance requirements. Thornton Park, MD Thornton Park MD, MD 02/07/2017 2:03:35 PM This report has been signed electronically. Number of Addenda: 0 Note Initiated On: 02/07/2017 9:36  AM Estimated Blood Loss: Estimated blood loss was minimal.      [facility]

## 2017-02-07 NOTE — Progress Notes (Signed)
[facility] [place] NAME: Gerald Montgomery    MR#:  [id number]  DATE OF BIRTH:  02-27-1956  SUBJECTIVE:  CHIEF COMPLAINT:   Chief Complaint  Patient presents with  . Recurrent UTI   -admitted for sepsis and also hematemesis. -No further active bleeding here in the hospital, however drop in hemoglobin noted. Going for EGD today. -Developed "red man" syndrome with IV vancomycin in the emergency room, slow infusion of vancomycin advised  REVIEW OF SYSTEMS:  Review of Systems  Constitutional: Negative for chills, fever and malaise/fatigue.  HENT: Negative for ear discharge and hearing loss.   Eyes: Negative for blurred vision and double vision.  Respiratory: Negative for cough, shortness of breath and wheezing.   Cardiovascular: Negative for chest pain and palpitations.  Gastrointestinal: Negative for abdominal pain, constipation, diarrhea, nausea and vomiting.  Genitourinary: Negative for dysuria.  Neurological: Negative for dizziness, seizures and headaches.  Psychiatric/Behavioral: Negative for depression.    DRUG ALLERGIES:   Allergies  Allergen Reactions  . Penicillins     Sickness/sweating  . Sulfa Antibiotics Itching    VITALS:  Blood pressure (!) 90/58, pulse 88, temperature 98.9 F (37.2 C), resp. rate 19, height 5\' 8"  (1.727 m), weight 54.4 kg (120 lb), SpO2 99 %.  PHYSICAL EXAMINATION:  Physical Exam  GENERAL:  61 y.o.-year-old patient lying in the bed with no acute distress.  EYES: Pupils equal, round, reactive to light and accommodation. No scleral icterus. Extraocular muscles intact.  HEENT: Head atraumatic, normocephalic. Oropharynx and nasopharynx clear.  NECK:  Supple, no jugular venous distention. No thyroid enlargement, no tenderness.  LUNGS: Normal breath sounds bilaterally, no wheezing, rales,rhonchi or crepitation. No use of accessory muscles of respiration.  CARDIOVASCULAR: S1, S2 normal. No murmurs, rubs,  or gallops.  ABDOMEN: Soft, nontender, nondistended. Bowel sounds present. No organomegaly or mass.  EXTREMITIES: No pedal edema, cyanosis, or clubbing. Spastic lower extremities NEUROLOGIC: Cranial nerves II through XII are intact. Muscle strength 3/5 in both upper extremities, spastic left hand, spastic lower extremities. Sensation intact. Gait not checked.  PSYCHIATRIC: The patient is alert, answering simple questions and following commands.  SKIN: No obvious rash, lesion, or ulcer.    LABORATORY PANEL:   CBC  Recent Labs Lab 02/07/17 0549  WBC 4.5  HGB 8.7*  HCT 25.4*  PLT 197   ------------------------------------------------------------------------------------------------------------------  Chemistries   Recent Labs Lab 02/05/17 1114 02/07/17 0549  NA 145 140  K 4.1 3.6  CL 111 109  CO2 23 27  GLUCOSE 151* 84  BUN 36* 10  CREATININE 0.64 0.46*  CALCIUM 7.8* 7.8*  AST 53*  --   ALT 32  --   ALKPHOS 68  --   BILITOT 0.6  --    ------------------------------------------------------------------------------------------------------------------  Cardiac Enzymes No results for input(s): TROPONINI in the last 168 hours. ------------------------------------------------------------------------------------------------------------------  RADIOLOGY:  No results found.  EKG:   Orders placed or performed during the hospital encounter of 02/05/17  . ED EKG 12-Lead  . 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  #1 sepsis-secondary to urinary tract infection. - urine cultures are pending, blood cultures growing gram-positive organisms at this time. -Currently on meropenem and vancomycin has been added. -Heart rate is much  controlled now.  WBC was elevated on admission.  #2 hematemesis-coffee-ground emesis while on Xarelto. Xarelto is on hold. -Drop in hemoglobin noted today to 8.7 from 10.4. No indication for transfusion at this time. But monitor if hb drops or hemodynamically unstable- transfuse  -No further coffee ground emesis noted.No dark stools since admission to [facility] consult. On IV Protonix. -Tolerated clear liquids yesterday, however due to drop in hemoglobin, going for upper GI endoscopy today  #3 cerebral palsy with chronic spasms-monitor, continue home meds  #4 depression-on Abilify  #5 neurogenic bladder-on bethanechol  #6 DVT prophylaxis-hold off blood thinners due to GI bleed  Sister at bedside and updated    All the records are reviewed and case discussed with Care Management/Social Workerr. Management plans discussed with the patient, family and they are in agreement.  CODE STATUS: full code  TOTAL TIME TAKING CARE OF THIS PATIENT: 36 minutes.   POSSIBLE D/C IN 1-2  DAYS, DEPENDING ON CLINICAL CONDITION.Gladstone Lighter M.D on 02/07/2017 at 9:32 AM  Between 7am to 6pm - Pager - [phone]  After 6pm go to [email or web address] - password [email or web address] [facility]  Office  [phone]  CC: Primary care physician; Roselee Nova, MD

## 2017-02-07 NOTE — Progress Notes (Signed)
Pt and family (sister at bedside & called pt's mother) updated EGD now scheduled for today. Consent signed by pt's sister. Pt has been NPO since 0700am.

## 2017-02-08 LAB — CBC
HCT: 27.5 % — ABNORMAL LOW (ref 40.0–52.0)
Hemoglobin: 9.6 g/dL — ABNORMAL LOW (ref 13.0–18.0)
MCH: 28.6 pg (ref 26.0–34.0)
MCHC: 34.8 g/dL (ref 32.0–36.0)
MCV: 82.1 fL (ref 80.0–100.0)
PLATELETS: 188 10*3/uL (ref 150–440)
RBC: 3.35 MIL/uL — ABNORMAL LOW (ref 4.40–5.90)
RDW: 14.1 % (ref 11.5–14.5)
WBC: 4.2 10*3/uL (ref 3.8–10.6)

## 2017-02-08 LAB — CULTURE, BLOOD (ROUTINE X 2): Special Requests: ADEQUATE

## 2017-02-08 LAB — URINE CULTURE: Culture: >=100000 — AB

## 2017-02-08 MED ORDER — ACETAMINOPHEN 325 MG PO TABS: 325.0000 mg | ORAL_TABLET | Freq: Four times a day (QID) | ORAL | Status: DC | PRN

## 2017-02-08 MED ORDER — PANTOPRAZOLE SODIUM 40 MG PO TBEC: 40.0000 mg | DELAYED_RELEASE_TABLET | Freq: Two times a day (BID) | ORAL | 0 refills | Status: DC

## 2017-02-08 MED ORDER — LEVOFLOXACIN 500 MG PO TABS: 500.0000 mg | ORAL_TABLET | Freq: Every day | ORAL | 0 refills | Status: DC

## 2017-02-08 NOTE — Care Management Important Message (Signed)
Important Message  Patient Details  Name: Gerald Montgomery MRN: [id number] Date of Birth: 04-06-1956   Medicare Important Message Given:  Yes    Shelbie Ammons, RN 02/08/2017, 8:45 AM

## 2017-02-08 NOTE — Progress Notes (Signed)
Discharge paperwork reviewed with patient's mother who cares for patient- verbalized understanding of new medications and follow up appointments. Pt is stable and ready for discharge. Pt placed in transfer pack, IVs removed, pink foam on sacrum for redness. Pt to be transported by EMS Home. EMS called.

## 2017-02-08 NOTE — Discharge Summary (Signed)
[facility] at [place] NAME: Gerald Montgomery    MR#:  [id number]  DATE OF BIRTH:  [birth year]  DATE OF ADMISSION:  02/05/2017 ADMITTING PHYSICIAN: Nicholes Mango, MD  DATE OF DISCHARGE: 01/1717 PRIMARY CARE PHYSICIAN: Roselee Nova, MD    ADMISSION DIAGNOSIS:  Sepsis, due to unspecified organism ([facility]) [A41.9] Urinary tract infection without hematuria, site unspecified [N39.0]  DISCHARGE DIAGNOSIS:  Active Problems:   Sepsis ([facility])   Pressure injury of skin urinary tract infection Reflux esophagitis Chronic urinary incontinence  SECONDARY DIAGNOSIS:   Past Medical History:  Diagnosis Date  . Depression   . History of DVT of lower extremity   . Mood disorder ([place])   . Neurogenic bladder   . Urinary incontinence     HOSPITAL COURSE:   HPI  Gerald Montgomery  is a 61 y.o. male with a known history of cerebral palsy, neurogenic bladder and chronic lower extremity DVTs presenting to the ED with a chief complaint of frequent urination. Patient has abnormal urinalysis and tachycardic. White count is elevated. Patient is started on broad-spectrum IV antibiotics and hospitalist team is called to admit the patient. Mom is at bedside. RN has noticed small amount of hematemesis.  #1 sepsis-secondary to urinary tract infection. - urine cultures -E coli -pansensitive , blood cultures growing gram-positive organism coag-negative Staphylococcus-from a single set of blood cultures-probably a contaminant -treated with meropenem and vancomycin during the hospital course will discharge patient withby mouth levofloxacin -Heart rate is much controlled now.  -leukocytosis resolved  #2 hematemesis-coffee-ground emesis while on Xarelto.  -Hematemesis resolved -Drop in hemoglobin noted  8.7--9.6 today from 10.4. No indication for transfusion at this time.  -No further coffee ground emesis noted.No dark stools since admission to [facility]  consult. On IV Protonix.recommending PPI twice a day outpatient GI follow-up, Resume Xarelto -Advance diet as tolerated patient is tolerating advanced diet -discharge patient to mom's care via EMS  #3 cerebral palsy with chronic spasms-monitor, continue home meds  #4 depression-on Abilify  #5 neurogenic bladder-on bethanechol  #6 DVT prophylaxis-resume  Xarelto  DISCHARGE CONDITIONS:   fair  CONSULTS OBTAINED:     PROCEDURES EGD has revealed reflux esophagitis-biopsies were taken  DRUG ALLERGIES:   Allergies  Allergen Reactions  . Penicillins     Sickness/sweating  . Sulfa Antibiotics Itching    DISCHARGE MEDICATIONS:   Current Discharge Medication List    START taking these medications   Details  acetaminophen (TYLENOL) 325 MG tablet Take 1 tablet (325 mg total) by mouth every 6 (six) hours as needed for mild pain (or Fever >/= 101).    levofloxacin (LEVAQUIN) 500 MG tablet Take 1 tablet (500 mg total) by mouth daily. Qty: 7 tablet, Refills: 0    pantoprazole (PROTONIX) 40 MG tablet Take 1 tablet (40 mg total) by mouth 2 (two) times daily. Qty: 60 tablet, Refills: 0      CONTINUE these medications which have NOT CHANGED   Details  ARIPiprazole (ABILIFY) 15 MG tablet TAKE 1 TABLET(15 MG) BY MOUTH DAILY Qty: 90 tablet, Refills: 1   Associated Diagnoses: Mood disorder (HCC)    rivaroxaban (XARELTO) 10 MG TABS tablet TAKE 1 TABLET(10 MG) BY MOUTH DAILY Qty: 90 tablet, Refills: 1   Associated Diagnoses: History of DVT of lower extremity    bethanechol (URECHOLINE) 50 MG tablet TAKE 1 TABLET(50 MG) BY MOUTH THREE TIMES DAILY Qty: 270 tablet, Refills: 1   Associated Diagnoses: Neurogenic  bladder      STOP taking these medications     nitrofurantoin, macrocrystal-monohydrate, (MACROBID) 100 MG capsule          DISCHARGE INSTRUCTIONS:   follow-up with primary care physician in a week Follow-up with gastroenterology in the urology in a week  DIET:   Cardiac diet  DISCHARGE CONDITION:  Fair  ACTIVITY:  Wheel chair bound   OXYGEN:  Home Oxygen: No.   Oxygen Delivery: room air  DISCHARGE LOCATION:  home   If you experience worsening of your admission symptoms, develop shortness of breath, life threatening emergency, suicidal or homicidal thoughts you must seek medical attention immediately by calling 911 or calling your MD immediately  if symptoms less severe.  You Must read complete instructions/literature along with all the possible adverse reactions/side effects for all the Medicines you take and that have been prescribed to you. Take any new Medicines after you have completely understood and accpet all the possible adverse reactions/side effects.   Please note  You were cared for by a hospitalist during your hospital stay. If you have any questions about your discharge medications or the care you received while you were in the hospital after you are discharged, you can call the unit and asked to speak with the hospitalist on call if the hospitalist that took care of you is not available. Once you are discharged, your primary care physician will handle any further medical issues. Please note that NO REFILLS for any discharge medications will be authorized once you are discharged, as it is imperative that you return to your primary care physician (or establish a relationship with a primary care physician if you do not have one) for your aftercare needs so that they can reassess your need for medications and monitor your lab values.     Today  Chief Complaint  Patient presents with  . Recurrent UTI   Patient is feeling much better. Denies any complaints. Tolerating diet. No other episodes of hematemesis. Wants to go home. Mom and daughter are at bedside ROS:  CONSTITUTIONAL: Denies fevers, chills. Denies any fatigue, weakness.  EYES: Denies blurry vision, double vision, eye pain. EARS, NOSE, THROAT: Denies tinnitus, ear pain,  hearing loss. RESPIRATORY: Denies cough, wheeze, shortness of breath.  CARDIOVASCULAR: Denies chest pain, palpitations, edema.  GASTROINTESTINAL: Denies nausea, vomiting, diarrhea, abdominal pain. Denies bright red blood per rectum. GENITOURINARY: Denies dysuria, hematuria. ENDOCRINE: Denies nocturia or thyroid problems. HEMATOLOGIC AND LYMPHATIC: Denies easy bruising or bleeding. SKIN: Denies rash or lesion. MUSCULOSKELETAL: congenital deformities from cerebral palsy NEUROLOGIC: congenital deformities from cerebral palsy PSYCHIATRIC: Denies anxiety or depressive symptoms.   VITAL SIGNS:  Blood pressure 99/60, pulse 88, temperature 97.9 F (36.6 C), temperature source Oral, resp. rate 15, height 5\' 8"  (1.727 m), weight 54.4 kg (120 lb), SpO2 96 %.  I/O:    Intake/Output Summary (Last 24 hours) at 02/08/17 1129 Last data filed at 02/08/17 0800  Gross per 24 hour  Intake              540 ml  Output                0 ml  Net              540 ml    PHYSICAL EXAMINATION:  GENERAL:  61 y.o.-year-old patient lying in the bed with no acute distress.  EYES: Pupils equal, round, reactive to light and accommodation. No scleral icterus. Extraocular muscles intact.  HEENT: Head atraumatic,  normocephalic. Oropharynx and nasopharynx clear.  NECK:  Supple, no jugular venous distention. No thyroid enlargement, no tenderness.  LUNGS: Normal breath sounds bilaterally, no wheezing, rales,rhonchi or crepitation. No use of accessory muscles of respiration.  CARDIOVASCULAR: S1, S2 normal. No murmurs, rubs, or gallops.  ABDOMEN: Soft, non-tender, non-distended. Bowel sounds present. No organomegaly or mass.  EXTREMITIES: No pedal edema, cyanosis, or clubbing.  NEUROLOGIC: patient is awake and alert and oriented 3. Spastic left upper extremity and bilateral lower extremities.  PSYCHIATRIC: The patient is alert and oriented x 3.  SKIN: No obvious rash, lesion, or ulcer.   DATA REVIEW:    CBC  Recent Labs Lab 02/08/17 0429  WBC 4.2  HGB 9.6*  HCT 27.5*  PLT 188    Chemistries   Recent Labs Lab 02/05/17 1114 02/07/17 0549  NA 145 140  K 4.1 3.6  CL 111 109  CO2 23 27  GLUCOSE 151* 84  BUN 36* 10  CREATININE 0.64 0.46*  CALCIUM 7.8* 7.8*  AST 53*  --   ALT 32  --   ALKPHOS 68  --   BILITOT 0.6  --     Cardiac Enzymes No results for input(s): TROPONINI in the last 168 hours.  Microbiology Results  Results for orders placed or performed during the hospital encounter of 02/05/17  Urine culture     Status: Abnormal   Collection Time: 02/05/17 11:14 AM  Result Value Ref Range Status   Specimen Description URINE, RANDOM  Final   Special Requests NONE  Final   Culture (A)  Final    >=100,000 COLONIES/mL ESCHERICHIA COLI >=100,000 COLONIES/mL AEROCOCCUS SPECIES Standardized susceptibility testing for this organism is not available. Performed at [facility], [place] [address], [place], [place] [address]    Report Status 02/08/2017 FINAL  Final   Organism ID, Bacteria ESCHERICHIA COLI (A)  Final      Susceptibility   Escherichia coli - MIC*    AMPICILLIN 4 SENSITIVE Sensitive     CEFAZOLIN <=4 SENSITIVE Sensitive     CEFTRIAXONE <=1 SENSITIVE Sensitive     CIPROFLOXACIN <=0.25 SENSITIVE Sensitive     GENTAMICIN <=1 SENSITIVE Sensitive     IMIPENEM <=0.25 SENSITIVE Sensitive     NITROFURANTOIN <=16 SENSITIVE Sensitive     TRIMETH/SULFA <=20 SENSITIVE Sensitive     AMPICILLIN/SULBACTAM <=2 SENSITIVE Sensitive     PIP/TAZO <=4 SENSITIVE Sensitive     Extended ESBL NEGATIVE Sensitive     * >=100,000 COLONIES/mL ESCHERICHIA COLI  Blood Culture (routine x 2)     Status: None (Preliminary result)   Collection Time: 02/05/17 12:40 PM  Result Value Ref Range Status   Specimen Description BLOOD BLOOD RIGHT HAND  Final   Special Requests   Final    BOTTLES DRAWN AEROBIC AND ANAEROBIC Blood Culture adequate volume   Culture NO GROWTH 3 DAYS  Final    Report Status PENDING  Incomplete  Blood Culture (routine x 2)     Status: Abnormal   Collection Time: 02/05/17 12:40 PM  Result Value Ref Range Status   Specimen Description BLOOD LEFT ANTECUBITAL  Final   Special Requests   Final    BOTTLES DRAWN AEROBIC AND ANAEROBIC Blood Culture adequate volume   Culture  Setup Time   Final    GRAM POSITIVE COCCI IN BOTH AEROBIC AND ANAEROBIC BOTTLES CRITICAL RESULT CALLED TO, READ BACK BY AND VERIFIED WITH: HANNAH LIFSEY ON 02/06/17 AT 1040 QSD    Culture (A)  Final    STAPHYLOCOCCUS SPECIES (COAGULASE NEGATIVE) THE SIGNIFICANCE OF ISOLATING THIS ORGANISM FROM A SINGLE SET OF BLOOD CULTURES WHEN MULTIPLE SETS ARE DRAWN IS UNCERTAIN. PLEASE NOTIFY THE MICROBIOLOGY DEPARTMENT WITHIN ONE WEEK IF SPECIATION AND SENSITIVITIES ARE REQUIRED. Performed at [facility], [place] [address] [place], [place] [address]    Report Status 02/08/2017 FINAL  Final  Blood Culture ID Panel (Reflexed)     Status: Abnormal   Collection Time: 02/05/17 12:40 PM  Result Value Ref Range Status   Enterococcus species NOT DETECTED NOT DETECTED Final   Listeria monocytogenes NOT DETECTED NOT DETECTED Final   Staphylococcus species DETECTED (A) NOT DETECTED Final    Comment: Methicillin (oxacillin) resistant coagulase negative staphylococcus. Possible blood culture contaminant (unless isolated from more than one blood culture draw or clinical case suggests pathogenicity). No antibiotic treatment is indicated for blood  culture contaminants. CRITICAL RESULT CALLED TO, READ BACK BY AND VERIFIED WITH: HANNAH LIFSEY ON 02/06/17 AT 1040 QSD    Staphylococcus aureus NOT DETECTED NOT DETECTED Final   Methicillin resistance DETECTED (A) NOT DETECTED Final    Comment: CRITICAL RESULT CALLED TO, READ BACK BY AND VERIFIED WITH: HANNAH LIFSEY ON 02/06/17 AT 1040 QSD    Streptococcus species NOT DETECTED NOT DETECTED Final   Streptococcus agalactiae NOT DETECTED NOT DETECTED Final    Streptococcus pneumoniae NOT DETECTED NOT DETECTED Final   Streptococcus pyogenes NOT DETECTED NOT DETECTED Final   Acinetobacter baumannii NOT DETECTED NOT DETECTED Final   Enterobacteriaceae species NOT DETECTED NOT DETECTED Final   Enterobacter cloacae complex NOT DETECTED NOT DETECTED Final   Escherichia coli NOT DETECTED NOT DETECTED Final   Klebsiella oxytoca NOT DETECTED NOT DETECTED Final   Klebsiella pneumoniae NOT DETECTED NOT DETECTED Final   Proteus species NOT DETECTED NOT DETECTED Final   Serratia marcescens NOT DETECTED NOT DETECTED Final   Haemophilus influenzae NOT DETECTED NOT DETECTED Final   Neisseria meningitidis NOT DETECTED NOT DETECTED Final   Pseudomonas aeruginosa NOT DETECTED NOT DETECTED Final   Candida albicans NOT DETECTED NOT DETECTED Final   Candida glabrata NOT DETECTED NOT DETECTED Final   Candida krusei NOT DETECTED NOT DETECTED Final   Candida parapsilosis NOT DETECTED NOT DETECTED Final   Candida tropicalis NOT DETECTED NOT DETECTED Final    RADIOLOGY:  No results found.  EKG:   Orders placed or performed during the hospital encounter of 02/05/17  . ED EKG 12-Lead  . ED EKG 12-Lead      Management plans discussed with the patient, family and they are in agreement.  CODE STATUS:     Code Status Orders        Start     Ordered   02/05/17 1407  Full code  Continuous     02/05/17 1406    Code Status History    Date Active Date Inactive Code Status Order ID Comments User Context   02/05/2016  6:29 PM 02/07/2016  4:04 PM Full Code [id number]  Hillary Bow, MD ED    Advance Directive Documentation     Most Recent Value  Type of Advance Directive  Healthcare Power of Attorney  Pre-existing out of facility DNR order (yellow form or pink MOST form)  -  "MOST" Form in Place?  -      TOTAL TIME TAKING CARE OF THIS PATIENT: 45  minutes.   Note: This dictation was prepared with Dragon dictation along with smaller phrase technology.  Any transcriptional errors that result  from this process are unintentional.   @[facility] @  on 02/08/2017 at 11:29 AM  Between 7am to 6pm - Pager - [phone]  After 6pm go to [email or web address] - password [email or web address]  [facility]  Office  [phone]  CC: Primary care physician; Roselee Nova, MD

## 2017-02-08 NOTE — Discharge Instructions (Signed)
follow-up with primary care physician in a week Follow-up with gastroenterology in the urology in a week

## 2017-02-09 ENCOUNTER — Telehealth: Payer: Self-pay

## 2017-02-09 LAB — SURGICAL PATHOLOGY

## 2017-02-10 ENCOUNTER — Ambulatory Visit: Payer: Medicare Other

## 2017-02-10 LAB — CULTURE, BLOOD (ROUTINE X 2)
CULTURE: NO GROWTH
Special Requests: ADEQUATE

## 2017-02-15 NOTE — Telephone Encounter (Signed)
Unable to reach patient for third time now. Pt needs hospital follow up scheduled. FYI to PCP. -MM

## 2017-02-16 ENCOUNTER — Encounter: Payer: Self-pay | Admitting: Gastroenterology

## 2017-02-18 ENCOUNTER — Other Ambulatory Visit: Payer: Self-pay | Admitting: Family Medicine

## 2017-02-18 DIAGNOSIS — Z86718 Personal history of other venous thrombosis and embolism: Secondary | ICD-10-CM

## 2017-03-04 ENCOUNTER — Ambulatory Visit: Payer: Medicare Other

## 2017-03-15 ENCOUNTER — Ambulatory Visit: Payer: Medicare Other

## 2017-03-16 ENCOUNTER — Ambulatory Visit: Payer: Medicare Other

## 2017-03-18 ENCOUNTER — Ambulatory Visit: Payer: Medicare Other | Admitting: Urology

## 2017-03-22 ENCOUNTER — Telehealth: Payer: Self-pay | Admitting: *Deleted

## 2017-03-22 ENCOUNTER — Telehealth: Payer: Self-pay | Admitting: Urology

## 2017-03-22 NOTE — Telephone Encounter (Signed)
Patient sister called to say patient had appointment last week but was cancelled because of mother having a stroke. CNA that takes care of patient concerned patient has a uti. Also the mother per sister states that's the way he is when he has an uti. Patient has symptoms of frequency, night sweats chills. Patient is totally bedridden and they have to have transportation wanted to come by and pick up a cup. Per Kallie Locks after speaking with Zara Council because patient has not been seen by provider this year we can only offer a nurse visit or patient can go to the ER. Patient Sister states she will talk it over with her mother and what she decides is the way they will go and she will call back if they do not decide the ER route and they get transportation.

## 2017-03-22 NOTE — Telephone Encounter (Signed)
Pt's sister called and stated they will try to give pt more water, but if need be they will go to ER.  If we need to reach her, cell# is [phone].

## 2017-03-25 ENCOUNTER — Ambulatory Visit (INDEPENDENT_AMBULATORY_CARE_PROVIDER_SITE_OTHER): Payer: Medicare Other | Admitting: Family Medicine

## 2017-03-25 VITALS — BP 124/76 | HR 90 | Temp 97.8°F | Resp 14

## 2017-03-25 DIAGNOSIS — R8290 Unspecified abnormal findings in urine: Secondary | ICD-10-CM | POA: Diagnosis not present

## 2017-03-25 NOTE — Progress Notes (Signed)
Name: Gerald Montgomery   MRN: [id number]    DOB: 1955-11-01   Date:03/25/2017       Progress Note  Subjective  Chief Complaint  Chief Complaint  Patient presents with  . Cystitis    smell and cold/ hot sweats     Urinary Tract Infection   This is a new problem. The quality of the pain is described as burning. There has been no fever. He is not sexually active. There is no history of pyelonephritis. Associated symptoms include chills and sweats. Pertinent negatives include no flank pain, hematuria or nausea. He has tried increased fluids for the symptoms. His past medical history is significant for urinary stasis. history of neurogenic bladder.     Past Medical History:  Diagnosis Date  . Depression   . History of DVT of lower extremity   . Mood disorder ([place])   . Neurogenic bladder   . Urinary incontinence     Past Surgical History:  Procedure Laterality Date  . ESOPHAGOGASTRODUODENOSCOPY Left 02/07/2017   Procedure: ESOPHAGOGASTRODUODENOSCOPY (EGD);  Surgeon: Thornton Park, MD;  Location: [facility];  Service: Gastroenterology;  Laterality: Left;  . filter surgery for blood clot  2013  . HIP FRACTURE SURGERY  2013    Family History  Problem Relation Age of Onset  . Heart disease Mother   . Atrial fibrillation Mother   . Diabetes Mother        Borderline  . Heart disease Father        Passed away of heart attack  . Kidney disease Neg Hx   . Prostate cancer Neg Hx     Social History   Social History  . Marital status: Single    Spouse name: N/A  . Number of children: N/A  . Years of education: N/A   Occupational History  . Not on file.   Social History Main Topics  . Smoking status: Never Smoker  . Smokeless tobacco: Never Used  . Alcohol use No  . Drug use: No  . Sexual activity: No   Other Topics Concern  . Not on file   Social History Narrative  . No narrative on file     Current Outpatient Prescriptions:  .  acetaminophen (TYLENOL) 325  MG tablet, Take 1 tablet (325 mg total) by mouth every 6 (six) hours as needed for mild pain (or Fever >/= 101)., Disp: , Rfl:  .  ARIPiprazole (ABILIFY) 15 MG tablet, TAKE 1 TABLET(15 MG) BY MOUTH DAILY, Disp: 90 tablet, Rfl: 1 .  bethanechol (URECHOLINE) 50 MG tablet, TAKE 1 TABLET(50 MG) BY MOUTH THREE TIMES DAILY, Disp: 270 tablet, Rfl: 1 .  levofloxacin (LEVAQUIN) 500 MG tablet, Take 1 tablet (500 mg total) by mouth daily., Disp: 7 tablet, Rfl: 0 .  pantoprazole (PROTONIX) 40 MG tablet, Take 1 tablet (40 mg total) by mouth 2 (two) times daily., Disp: 60 tablet, Rfl: 0 .  rivaroxaban (XARELTO) 10 MG TABS tablet, TAKE 1 TABLET(10 MG) BY MOUTH DAILY, Disp: 90 tablet, Rfl: 1 .  XARELTO 10 MG TABS tablet, TAKE 1 TABLET(10 MG) BY MOUTH DAILY, Disp: 90 tablet, Rfl: 0  Allergies  Allergen Reactions  . Penicillins     Sickness/sweating  . Sulfa Antibiotics Itching     Review of Systems  Constitutional: Positive for chills.  Gastrointestinal: Negative for nausea.  Genitourinary: Negative for flank pain and hematuria.      Objective  Vitals:   03/25/17 1149  BP: 124/76  Pulse: 90  Resp: 14  Temp: 97.8 F (36.6 C)  TempSrc: Oral  SpO2: 98%    Physical Exam  Constitutional: He is well-developed, well-nourished, and in no distress. Vital signs are normal.  Sitting comfortably in wheelchair,   Cardiovascular: Normal rate, regular rhythm, S1 normal, S2 normal and normal heart sounds.   No murmur heard. Pulmonary/Chest: Effort normal and breath sounds normal. He has no wheezes.  Abdominal: Soft. Bowel sounds are normal. There is no tenderness.  Nursing note and vitals reviewed.     Assessment & Plan  1. Foul smelling urine Suspect a UTI, would like to test before starting on treatment and patient was advised to bring a sample of urine. If his symptoms get worse, he was asked to give [place] a call right away. - Urine Culture - Urinalysis, Routine w reflex microscopic - CBC with  Differential/Platelet - COMPLETE METABOLIC PANEL WITH GFR   Renlee Floor [facility] 03/25/2017 11:52 AM

## 2017-03-26 DIAGNOSIS — R8290 Unspecified abnormal findings in urine: Secondary | ICD-10-CM | POA: Diagnosis not present

## 2017-03-27 LAB — URINALYSIS, ROUTINE W REFLEX MICROSCOPIC
BILIRUBIN URINE: NEGATIVE
GLUCOSE, UA: NEGATIVE
Hgb urine dipstick: NEGATIVE
Ketones, ur: NEGATIVE
NITRITE: NEGATIVE
PH: 7 (ref 5.0–8.0)
RBC / HPF: NONE SEEN /HPF (ref 0–2)
SPECIFIC GRAVITY, URINE: 1.015 (ref 1.001–1.03)
WBC, UA: >=60 /HPF — AB (ref 0–5)

## 2017-03-29 ENCOUNTER — Other Ambulatory Visit: Payer: Self-pay | Admitting: Family Medicine

## 2017-03-29 DIAGNOSIS — N3000 Acute cystitis without hematuria: Secondary | ICD-10-CM

## 2017-03-29 DIAGNOSIS — N3001 Acute cystitis with hematuria: Secondary | ICD-10-CM

## 2017-03-29 LAB — URINE CULTURE
MICRO NUMBER: 81234343
SPECIMEN QUALITY:: ADEQUATE

## 2017-03-29 MED ORDER — CIPROFLOXACIN HCL 500 MG PO TABS: 500.0000 mg | ORAL_TABLET | Freq: Two times a day (BID) | ORAL | 0 refills | Status: AC

## 2017-03-30 NOTE — Progress Notes (Signed)
Pt. has been started on Ciprofloxacin 500 mg BID x 7 days for UTI associated with pseudomonas aeruginosa. Please inform pt.  (Routing comment   Tried reaching out to the pt caregiver but not able to leave a message. Received a message stating the  Caller not able to received your call.

## 2017-04-02 ENCOUNTER — Encounter: Payer: Self-pay | Admitting: Urology

## 2017-04-02 ENCOUNTER — Ambulatory Visit (INDEPENDENT_AMBULATORY_CARE_PROVIDER_SITE_OTHER): Payer: Medicare Other | Admitting: Urology

## 2017-04-02 VITALS — BP 128/81 | HR 113 | Ht 68.0 in

## 2017-04-02 DIAGNOSIS — R310 Gross hematuria: Secondary | ICD-10-CM | POA: Diagnosis not present

## 2017-04-02 DIAGNOSIS — N390 Urinary tract infection, site not specified: Secondary | ICD-10-CM

## 2017-04-02 LAB — MICROSCOPIC EXAMINATION: RBC, UA: NONE SEEN /hpf (ref 0–?)

## 2017-04-02 LAB — URINALYSIS, COMPLETE
Bilirubin, UA: NEGATIVE
Glucose, UA: NEGATIVE
KETONES UA: NEGATIVE
NITRITE UA: NEGATIVE
Protein, UA: NEGATIVE
RBC, UA: NEGATIVE
Specific Gravity, UA: 1.01 (ref 1.005–1.030)
Urobilinogen, Ur: 0.2 mg/dL (ref 0.2–1.0)
pH, UA: 6 (ref 5.0–7.5)

## 2017-04-02 LAB — BLADDER SCAN AMB NON-IMAGING: Scan Result: 589

## 2017-04-02 NOTE — Progress Notes (Signed)
Cath Change/ Replacement  Patient is present today for a catheter.  Patient was cleaned and prepped in a sterile fashion with betadine and 2% lidocaine jelly was instilled into the urethra. A 16 FR foley cath was replaced into the bladder no complications were noted Urine return was noted 477ml and urine was yellow in color. The balloon was filled with 85ml of sterile water. A night bag was attached for drainage.  Patient was given proper instruction on catheter care.    Preformed by: Elberta Leatherwood, CMA

## 2017-04-02 NOTE — Progress Notes (Signed)
04/02/2017 10:18 AM   Gerald Montgomery [birth year] [id number]  Referring provider: Roselee Nova, MD [address] [place], [place] [address]  Chief Complaint  Patient presents with  . Hematuria    HPI: The patient is a 61 year old gentleman who presents today for one-year follow-up after undergoing a negative gross hematuria workup. He is a poor historian due to his cerebral palsy. Patient was intially found to have gross hematuria in the Summer of 2017. Urine culture was negative. Patient does have a prior history of hematuria.    He has not had nephrolithiasis, trauma to the genitourinary tract, BPH or malignancies of the genitourinary tract. He does not have a family medical history of nephrolithiasis, malignancies of the genitourinary tract or hematuria.   He also underwent a hematuria work up with Dr. Yves Dill in 2011. No GU malignancies were discovered, but his sister states that Dr. Yves Dill went into his bladder and "washed it out."   He is not a smoker. He is not exposed to secondhand smoke. He has not worked with Sports administrator    CT showed no source for gross hematuria in June 2017 was negative. Cysto in August 2017 was also negative.    In early November 2018, he was treated for Pseudomonas urinary tract infection.  He was admitted to the hospital with an E. Coli UTI in September 2018.  He also had an E. coli culture that was positive in May 2018. All cultures were pan-sensitive.  During these episodes, he has symptoms which include changes in mental status, fatigue, fevers, and suprapubic discomfort.  He has been getting these quite frequently over the last year.  He is currently on a course of ciprofloxacin.  PVR today was 589.    PMH: Past Medical History:  Diagnosis Date  . Depression   . History of DVT of lower extremity   . Mood disorder ([place])   . Neurogenic bladder   . Urinary incontinence     Surgical History: Past Surgical  History:  Procedure Laterality Date  . filter surgery for blood clot  2013  . HIP FRACTURE SURGERY  2013    Home Medications:  Allergies as of 04/02/2017      Reactions   Penicillins    Sickness/sweating   Sulfa Antibiotics Itching      Medication List        Accurate as of 04/02/17 10:18 AM. Always use your most recent med list.          acetaminophen 325 MG tablet Commonly known as:  TYLENOL Take 1 tablet (325 mg total) by mouth every 6 (six) hours as needed for mild pain (or Fever >/= 101).   ARIPiprazole 15 MG tablet Commonly known as:  ABILIFY TAKE 1 TABLET(15 MG) BY MOUTH DAILY   bethanechol 50 MG tablet Commonly known as:  URECHOLINE TAKE 1 TABLET(50 MG) BY MOUTH THREE TIMES DAILY   ciprofloxacin 500 MG tablet Commonly known as:  CIPRO Take 1 tablet (500 mg total) 2 (two) times daily for 7 days by mouth.   pantoprazole 40 MG tablet Commonly known as:  PROTONIX Take 1 tablet (40 mg total) by mouth 2 (two) times daily.   rivaroxaban 10 MG Tabs tablet Commonly known as:  XARELTO TAKE 1 TABLET(10 MG) BY MOUTH DAILY   XARELTO 10 MG Tabs tablet Generic drug:  rivaroxaban TAKE 1 TABLET(10 MG) BY MOUTH DAILY       Allergies:  Allergies  Allergen Reactions  .  Penicillins     Sickness/sweating  . Sulfa Antibiotics Itching    Family History: Family History  Problem Relation Age of Onset  . Heart disease Mother   . Atrial fibrillation Mother   . Diabetes Mother        Borderline  . Heart disease Father        Passed away of heart attack  . Kidney disease Neg Hx   . Prostate cancer Neg Hx     Social History:  reports that  has never smoked. he has never used smokeless tobacco. He reports that he does not drink alcohol or use drugs.  ROS: UROLOGY Frequent Urination?: No Hard to postpone urination?: No Burning/pain with urination?: No Get up at night to urinate?: No Leakage of urine?: No Urine stream starts and stops?: No Trouble starting  stream?: No Do you have to strain to urinate?: No Blood in urine?: No Urinary tract infection?: No Sexually transmitted disease?: No Injury to kidneys or bladder?: No Painful intercourse?: No Weak stream?: No Erection problems?: No Penile pain?: No  Gastrointestinal Nausea?: No Vomiting?: No Indigestion/heartburn?: No Diarrhea?: No Constipation?: No  Constitutional Fever: No Night sweats?: No Weight loss?: No Fatigue?: No  Skin Skin rash/lesions?: No Itching?: No  Eyes Blurred vision?: No Double vision?: No  Ears/Nose/Throat Sore throat?: No Sinus problems?: Yes  Hematologic/Lymphatic Swollen glands?: No Easy bruising?: No  Cardiovascular Leg swelling?: No Chest pain?: No  Respiratory Cough?: No Shortness of breath?: No  Endocrine Excessive thirst?: No  Musculoskeletal Back pain?: No Joint pain?: No  Neurological Headaches?: No Dizziness?: No  Psychologic Depression?: No Anxiety?: No  Physical Exam: BP 128/81 (BP Location: Left Arm, Patient Position: Sitting, Cuff Size: Normal)   Pulse (!) 113   Ht 5\' 8"  (1.727 m)   BMI 18.25 kg/m   Constitutional:  Alert and oriented, No acute distress. HEENT: [place] AT, moist mucus membranes.  Trachea midline, no masses. Cardiovascular: No clubbing, cyanosis, or edema. Respiratory: Normal respiratory effort, no increased work of breathing. GI: Abdomen is soft, nontender, nondistended, no abdominal masses GU: No CVA tenderness.  Skin: No rashes, bruises or suspicious lesions. Lymph: No cervical or inguinal adenopathy. Neurologic: Grossly intact, no focal deficits, moving all 4 extremities. Psychiatric: Normal mood and affect.  Laboratory Data: Lab Results  Component Value Date   WBC 4.2 02/08/2017   HGB 9.6 (L) 02/08/2017   HCT 27.5 (L) 02/08/2017   MCV 82.1 02/08/2017   PLT 188 02/08/2017    Lab Results  Component Value Date   CREATININE 0.46 (L) 02/07/2017    No results found for: PSA  No  results found for: TESTOSTERONE  Lab Results  Component Value Date   HGBA1C 5.2 03/04/2016    Urinalysis    Component Value Date/Time   COLORURINE YELLOW 03/26/2017 1442   APPEARANCEUR CLEAR 03/26/2017 1442   APPEARANCEUR Cloudy (A) 10/16/2016 1034   LABSPEC 1.015 03/26/2017 1442   LABSPEC 1.017 01/11/2012 0208   PHURINE 7.0 03/26/2017 1442   GLUCOSEU NEGATIVE 03/26/2017 1442   GLUCOSEU Negative 01/11/2012 0208   HGBUR NEGATIVE 03/26/2017 1442   BILIRUBINUR NEGATIVE 02/05/2017 1114   BILIRUBINUR Negative 10/16/2016 1034   BILIRUBINUR Negative 01/11/2012 0208   KETONESUR NEGATIVE 03/26/2017 1442   PROTEINUR TRACE (A) 03/26/2017 1442   UROBILINOGEN negative 10/08/2015 1009   NITRITE NEGATIVE 03/26/2017 1442   LEUKOCYTESUR 3+ (A) 03/26/2017 1442   LEUKOCYTESUR 3+ (A) 10/16/2016 1034   LEUKOCYTESUR 3+ 01/11/2012 0208    Assessment &  Plan:    1.  Recurrent UTI I discussed with the patient's family that his recurrent UTIs are from his urinary retention until proven otherwise.  His PVR today is almost 600 cc.  We will place a Foley catheter at this time.  He will finish his current course of ciprofloxacin.  He will follow-up in 1 month to see how is doing.  He will need a Foley catheter exchange at that time.  I did discuss long-term ways to manage this which include Foley versus CIC versus SP tube.  The patient's sister expressed significant interest in having an SP tube placed.  If he is doing well in 1 month without any further urinary tract infections, we will arrange for an SP tube to be placed.  We did discuss that having an indwelling catheter will undoubtedly cause asymptomatic bacteria.  The family was warned not to allow anyone to treat the patient for urinary tract infection unless he has symptoms of a urinary tract infection and not just a positive culture.  2. Gross hematuria Negative work up in 2017.   Return in about 4 weeks (around 04/30/2017).  Nickie Retort,  MD  [facility] [address] [place], [place] [address] [phone]

## 2017-04-13 ENCOUNTER — Other Ambulatory Visit: Payer: Self-pay | Admitting: Family Medicine

## 2017-04-13 DIAGNOSIS — N319 Neuromuscular dysfunction of bladder, unspecified: Secondary | ICD-10-CM

## 2017-04-14 ENCOUNTER — Encounter: Payer: Self-pay | Admitting: Urology

## 2017-04-14 ENCOUNTER — Other Ambulatory Visit: Payer: Self-pay | Admitting: Family Medicine

## 2017-04-14 ENCOUNTER — Ambulatory Visit (INDEPENDENT_AMBULATORY_CARE_PROVIDER_SITE_OTHER): Payer: Medicare Other | Admitting: Urology

## 2017-04-14 ENCOUNTER — Telehealth: Payer: Self-pay

## 2017-04-14 ENCOUNTER — Telehealth: Payer: Self-pay | Admitting: Family Medicine

## 2017-04-14 VITALS — BP 113/82 | HR 109 | Ht 68.0 in

## 2017-04-14 DIAGNOSIS — N319 Neuromuscular dysfunction of bladder, unspecified: Secondary | ICD-10-CM

## 2017-04-14 DIAGNOSIS — N029 Recurrent and persistent hematuria with unspecified morphologic changes: Secondary | ICD-10-CM | POA: Diagnosis not present

## 2017-04-14 DIAGNOSIS — N3001 Acute cystitis with hematuria: Secondary | ICD-10-CM

## 2017-04-14 DIAGNOSIS — Z87448 Personal history of other diseases of urinary system: Secondary | ICD-10-CM | POA: Diagnosis not present

## 2017-04-14 LAB — MICROSCOPIC EXAMINATION
Epithelial Cells (non renal): NONE SEEN /hpf (ref 0–10)
RBC, UA: NONE SEEN /hpf (ref 0–?)

## 2017-04-14 LAB — URINALYSIS, COMPLETE
Bilirubin, UA: NEGATIVE
GLUCOSE, UA: NEGATIVE
Ketones, UA: NEGATIVE
NITRITE UA: POSITIVE — AB
PH UA: 5.5 (ref 5.0–7.5)
Specific Gravity, UA: 1.025 (ref 1.005–1.030)
UUROB: 1 mg/dL (ref 0.2–1.0)

## 2017-04-14 MED ORDER — NITROFURANTOIN MONOHYD MACRO 100 MG PO CAPS: 100.0000 mg | ORAL_CAPSULE | Freq: Two times a day (BID) | ORAL | 0 refills | Status: DC

## 2017-04-14 NOTE — Telephone Encounter (Signed)
Pt's sister, [name] called voicing concerns about the pt vomiting a large amount of black colored emesis. Pt's sister is not currently with the pt, but was told this information by her niece. Pt was seen today in the office for hematuria. Pt's sister states that the pt has complained of abdominal pain and states he does not feel well.Hassan Rowan states that the pt is bedridden and has to call transportation to get around. Advised pt's sister to call EMS to have pt evaluated. Pt's sister states that the pt was sleep now and would see how he felt when he got up. Pt's sister, Hassan Rowan strongly encouraged to have the pt evaluated at the ER and explained to her that having black colored emesis is a cause for concern. Pt's sister verbalized understanding.

## 2017-04-14 NOTE — Progress Notes (Signed)
04/14/2017 7:08 PM   Gerald Montgomery Sep 29, 1955 [id number]  Referring provider: Roselee Nova, MD [address] [place], [place] [address]  Chief Complaint  Patient presents with  . Urinary Tract Infection    HPI: 60 yo WM with cerebral palsy who presents today with his sister and mother who the complaint of possible UTI.  He has been complaining of suprapubic pain during the night and decreased urine output.  Family states that he only had a minimal output during the night but when the aide repositioned him, the urine started to flow.  They said there was blood in the urine at that time.  They could not be specific of the amount of urine that was returned.    Family also states that he has been having chills, nausea and vomiting.   Foley was plugged for specimen.  His UA today is positive for 11-30 WBC's, many bacteria and nitrite positive.       Background history He is a poor historian due to his cerebral palsy. Patient was Initially found to have gross hematuria in the Summer of 2017. Urine culture was negative. Patient does have a prior history of hematuria.  He has not had nephrolithiasis, trauma to the genitourinary tract, BPH or malignancies of the genitourinary tract. He does not have a family medical history of nephrolithiasis, malignancies of the genitourinary tract or hematuria.   He also underwent a hematuria work up with Dr. Yves Dill in 2011. No GU malignancies were discovered, but his sister states that Dr. Yves Dill went into his bladder and "washed it out."  He is not a smoker. He is not exposed to secondhand smoke. He has not worked with Sports administrator   CT showed no source for gross hematuria in June 2017 was negative. Cysto in August 2017 was also negative.    In early November 2018, he was treated for Pseudomonas urinary tract infection.  He was admitted to the hospital with an E. Coli UTI in September 2018.  He also had an E. coli culture that  was positive in May 2018. All cultures were pan-sensitive.  During these episodes, he has symptoms which include changes in mental status, fatigue, fevers, and suprapubic discomfort.  He has been getting these quite frequently over the last year.    PMH: Past Medical History:  Diagnosis Date  . Depression   . History of DVT of lower extremity   . Mood disorder ([place])   . Neurogenic bladder   . Urinary incontinence     Surgical History: Past Surgical History:  Procedure Laterality Date  . ESOPHAGOGASTRODUODENOSCOPY Left 02/07/2017   Procedure: ESOPHAGOGASTRODUODENOSCOPY (EGD);  Surgeon: Thornton Park, MD;  Location: [facility];  Service: Gastroenterology;  Laterality: Left;  . filter surgery for blood clot  2013  . HIP FRACTURE SURGERY  2013    Home Medications:  Allergies as of 04/14/2017      Reactions   Penicillins    Sickness/sweating   Sulfa Antibiotics Itching      Medication List        Accurate as of 04/14/17 11:59 PM. Always use your most recent med list.          acetaminophen 325 MG tablet Commonly known as:  TYLENOL Take 1 tablet (325 mg total) by mouth every 6 (six) hours as needed for mild pain (or Fever >/= 101).   ARIPiprazole 15 MG tablet Commonly known as:  ABILIFY TAKE 1 TABLET(15 MG) BY MOUTH DAILY  bethanechol 50 MG tablet Commonly known as:  URECHOLINE TAKE 1 TABLET(50 MG) BY MOUTH THREE TIMES DAILY   nitrofurantoin (macrocrystal-monohydrate) 100 MG capsule Commonly known as:  MACROBID Take 1 capsule (100 mg total) by mouth every 12 (twelve) hours.   pantoprazole 40 MG tablet Commonly known as:  PROTONIX Take 1 tablet (40 mg total) by mouth 2 (two) times daily.   XARELTO 10 MG Tabs tablet Generic drug:  rivaroxaban TAKE 1 TABLET(10 MG) BY MOUTH DAILY       Allergies:  Allergies  Allergen Reactions  . Penicillins     Sickness/sweating  . Sulfa Antibiotics Itching    Family History: Family History  Problem Relation  Age of Onset  . Heart disease Mother   . Atrial fibrillation Mother   . Diabetes Mother        Borderline  . Heart disease Father        Passed away of heart attack  . Kidney disease Neg Hx   . Prostate cancer Neg Hx     Social History:  reports that  has never smoked. he has never used smokeless tobacco. He reports that he does not drink alcohol or use drugs.  ROS: UROLOGY Frequent Urination?: No Hard to postpone urination?: Yes Burning/pain with urination?: No Get up at night to urinate?: No Leakage of urine?: No Urine stream starts and stops?: No Trouble starting stream?: No Do you have to strain to urinate?: No Blood in urine?: Yes Urinary tract infection?: No Sexually transmitted disease?: No Injury to kidneys or bladder?: No Painful intercourse?: No Weak stream?: No Erection problems?: No Penile pain?: Yes  Gastrointestinal Nausea?: Yes Vomiting?: Yes Indigestion/heartburn?: No Diarrhea?: No Constipation?: No  Constitutional Fever: No Night sweats?: Yes Weight loss?: No Fatigue?: No  Skin Skin rash/lesions?: No Itching?: No  Eyes Blurred vision?: No Double vision?: No  Ears/Nose/Throat Sore throat?: No Sinus problems?: Yes  Hematologic/Lymphatic Swollen glands?: No Easy bruising?: No  Cardiovascular Leg swelling?: No Chest pain?: No  Respiratory Cough?: No Shortness of breath?: No  Endocrine Excessive thirst?: No  Musculoskeletal Back pain?: No Joint pain?: No  Neurological Headaches?: No Dizziness?: No  Psychologic Depression?: No Anxiety?: No  Physical Exam: BP 113/82   Pulse (!) 109   Ht 5\' 8"  (1.727 m)   BMI 18.25 kg/m   Constitutional:  Alert and oriented, No acute distress. HEENT: [place] AT, moist mucus membranes.  Trachea midline, no masses. Cardiovascular: No clubbing, cyanosis, or edema. Respiratory: Normal respiratory effort, no increased work of breathing. GI: Abdomen is soft, nontender, nondistended, no  abdominal masses GU: No CVA tenderness.  Skin: No rashes, bruises or suspicious lesions. Lymph: No cervical or inguinal adenopathy. Neurologic: Grossly intact, no focal deficits, moving all 4 extremities. Psychiatric: Normal mood and affect.  Laboratory Data: Lab Results  Component Value Date   WBC 4.2 02/08/2017   HGB 9.6 (L) 02/08/2017   HCT 27.5 (L) 02/08/2017   MCV 82.1 02/08/2017   PLT 188 02/08/2017    Lab Results  Component Value Date   CREATININE 0.46 (L) 02/07/2017    Urinalysis 11-30 WBC's, many bacteria and nitrite positive.  See Epic.   Assessment & Plan:    1.  Recurrent UTI  - UA is suspicious for infection - will send for culture  -Will start nitrofurantoin empirically -will adjust if necessary once urine culture and sensitivities are available  - Advised to contact our office or seek treatment in the ED if becomes febrile or pain/  vomiting are difficult control in order to arrange for emergent/urgent intervention  2. History of hematuria  - Negative work up in 2017.   - no hematuria on today's UA  Return for pending urine culture resutls.  Zara Council, [facility] [address], [place], [place] [address] [phone]

## 2017-04-14 NOTE — Telephone Encounter (Signed)
Pt sister, Morey Hummingbird, called stating pt is having suprapubic pain and gross hematuria. Morey Hummingbird stated pt got cold last night and that is not his norm. Morey Hummingbird stated that pt only had 300cc of output last night in night time bag. Morey Hummingbird also stated it is really hard for pt to get to office. Offered pt a nurse visit today or can see Larene Beach next week. Morey Hummingbird and pt mother stated they wanted to see a provider today. An appt was made with [facility] for today.

## 2017-04-14 NOTE — Telephone Encounter (Signed)
Copied from [place] [id number]. Topic: Quick Communication - See Telephone Encounter >> Apr 14, 2017 10:48 AM Cleaster Corin, NT wrote: CRM for notification. See Telephone encounter for:   04/14/17. Pt. Sister (carrie)called to see if pt. [name] Can be refilled   [facility] [phone] - Gerald [address], [address] AT [place] [address] Phone: [phone] Fax: [phone] Not a 24 hour pharmacy; exact hours not known

## 2017-04-17 LAB — CULTURE, URINE COMPREHENSIVE

## 2017-04-19 ENCOUNTER — Telehealth: Payer: Self-pay

## 2017-04-19 NOTE — Telephone Encounter (Signed)
It is all right to take [name] with Macrobid. Thank you

## 2017-04-19 NOTE — Telephone Encounter (Signed)
Copied from [place]. Topic: Inquiry >> Apr 14, 2017  4:16 PM Oliver Pila B wrote: Reason for CRM: PT called and asked about his Rx of bethanechol, wants to know if its okay to take this medicine along with his macrobid that he is already taking

## 2017-04-19 NOTE — Telephone Encounter (Signed)
-----   Message from Nori Riis, PA-C sent at 04/17/2017  5:10 PM EST ----- Please let Mr. Lile sister know that his urine culture was negative.

## 2017-04-19 NOTE — Telephone Encounter (Signed)
Spoke with pt sister in reference to ucx results. Sister voiced understanding.

## 2017-04-19 NOTE — Telephone Encounter (Signed)
Pt mother states he is doing a lot better. Pt also informed

## 2017-04-27 ENCOUNTER — Telehealth: Payer: Self-pay

## 2017-04-27 NOTE — Telephone Encounter (Signed)
Copied from [place] [id number]. Topic: General - Other >> Apr 26, 2017 12:15 PM Yvette Rack wrote: Reason for CRM: patient need a Gel Mattress for his bed and railings which is broke the PT came over there and told his sister that patient need these items please call Marty Heck at [phone]

## 2017-04-30 ENCOUNTER — Encounter: Payer: Self-pay | Admitting: Urology

## 2017-04-30 ENCOUNTER — Other Ambulatory Visit: Payer: Self-pay | Admitting: Radiology

## 2017-04-30 ENCOUNTER — Ambulatory Visit (INDEPENDENT_AMBULATORY_CARE_PROVIDER_SITE_OTHER): Payer: Medicare Other | Admitting: Urology

## 2017-04-30 VITALS — BP 149/87 | HR 95

## 2017-04-30 DIAGNOSIS — R339 Retention of urine, unspecified: Secondary | ICD-10-CM

## 2017-04-30 NOTE — Telephone Encounter (Signed)
Left message on machine to call back to discuss Dr. Trena Platt previous message below

## 2017-04-30 NOTE — Progress Notes (Signed)
04/30/2017 10:23 AM   Gerald Montgomery 1956/02/15 [id number]  Referring provider: Roselee Nova, MD [address] [place], [place] [address]  Chief Complaint  Patient presents with  . Hematuria    HPI: The patient is a 61 year old gentleman who presents today for one-year follow-up. He is a poor historian due to hiscerebral palsy. Patient was intially found to have gross hematuria in the Summer of 2017. Urine culture was negative. Patient does have a prior history of hematuria.   He also underwent a hematuria work up with Dr. Yves Dill in 2011. No GU malignancies were discovered, but his sister states that Dr. Yves Dill went into his bladder and "washed it out."   He is not a smoker. He is not exposed to secondhand smoke. He has not worked with Sports administrator  CT showed no source for gross hematuria in June 2017 was negative. Cysto in August 2017 was also negative.    In early November 2018, he was treated for Pseudomonas urinary tract infection.  He was admitted to the hospital with an E. Coli UTI in September 2018.  He also had an E. coli culture that was positive in May 2018. All cultures were pan-sensitive.  During these episodes, he has symptoms which include changes in mental status, fatigue, fevers, and suprapubic discomfort.  He has been getting these quite frequently over the last year.  He was found to be in urinary retention with a PVR of 589 in November 2018.   A Foley catheter was placed the patient finished a course of ciprofloxacin for a UTI.  He did have a history of neurogenic bladder but was not known to be  and retention until at that time.  Since his catheter was placed, he has had no recurrent urinary tract infections.  He is tolerating it well.  The family is very happy with his improvement.  He has been on no antibiotics since he was last seen.  The family is interested in changing to a suprapubic catheter.     PMH: Past Medical  History:  Diagnosis Date  . Depression   . History of DVT of lower extremity   . Mood disorder ([place])   . Neurogenic bladder   . Urinary incontinence     Surgical History: Past Surgical History:  Procedure Laterality Date  . ESOPHAGOGASTRODUODENOSCOPY Left 02/07/2017   Procedure: ESOPHAGOGASTRODUODENOSCOPY (EGD);  Surgeon: Thornton Park, MD;  Location: [facility];  Service: Gastroenterology;  Laterality: Left;  . filter surgery for blood clot  2013  . HIP FRACTURE SURGERY  2013    Home Medications:  Allergies as of 04/30/2017      Reactions   Penicillins    Sickness/sweating   Sulfa Antibiotics Itching      Medication List        Accurate as of 04/30/17 10:23 AM. Always use your most recent med list.          acetaminophen 325 MG tablet Commonly known as:  TYLENOL Take 1 tablet (325 mg total) by mouth every 6 (six) hours as needed for mild pain (or Fever >/= 101).   ARIPiprazole 15 MG tablet Commonly known as:  ABILIFY TAKE 1 TABLET(15 MG) BY MOUTH DAILY   bethanechol 50 MG tablet Commonly known as:  URECHOLINE TAKE 1 TABLET(50 MG) BY MOUTH THREE TIMES DAILY   pantoprazole 40 MG tablet Commonly known as:  PROTONIX Take 1 tablet (40 mg total) by mouth 2 (two) times daily.   Alveda Reasons  10 MG Tabs tablet Generic drug:  rivaroxaban TAKE 1 TABLET(10 MG) BY MOUTH DAILY       Allergies:  Allergies  Allergen Reactions  . Penicillins     Sickness/sweating  . Sulfa Antibiotics Itching    Family History: Family History  Problem Relation Age of Onset  . Heart disease Mother   . Atrial fibrillation Mother   . Diabetes Mother        Borderline  . Heart disease Father        Passed away of heart attack  . Kidney disease Neg Hx   . Prostate cancer Neg Hx     Social History:  reports that  has never smoked. he has never used smokeless tobacco. He reports that he does not drink alcohol or use drugs.  ROS: UROLOGY Frequent Urination?: No Hard to  postpone urination?: No Burning/pain with urination?: No Get up at night to urinate?: No Leakage of urine?: No Urine stream starts and stops?: No Trouble starting stream?: No Do you have to strain to urinate?: No Blood in urine?: No Urinary tract infection?: No Sexually transmitted disease?: No Injury to kidneys or bladder?: No Painful intercourse?: No Weak stream?: No Erection problems?: No Penile pain?: No  Gastrointestinal Nausea?: No Vomiting?: No Indigestion/heartburn?: No Diarrhea?: No Constipation?: No  Constitutional Fever: No Night sweats?: No Weight loss?: No Fatigue?: No  Skin Skin rash/lesions?: No Itching?: No  Eyes Blurred vision?: No Double vision?: No  Ears/Nose/Throat Sore throat?: No Sinus problems?: No  Hematologic/Lymphatic Swollen glands?: No Easy bruising?: No  Cardiovascular Leg swelling?: No Chest pain?: No  Respiratory Cough?: No Shortness of breath?: No  Endocrine Excessive thirst?: No  Musculoskeletal Back pain?: No Joint pain?: No  Neurological Headaches?: No Dizziness?: No  Psychologic Depression?: No Anxiety?: Yes  Physical Exam: BP (!) 149/87 (BP Location: Left Arm, Patient Position: Sitting, Cuff Size: Normal)   Pulse 95   Constitutional:  Alert and oriented, No acute distress. HEENT: [place] AT, moist mucus membranes.  Trachea midline, no masses. Cardiovascular: No clubbing, cyanosis, or edema. Respiratory: Normal respiratory effort, no increased work of breathing. GI: Abdomen is soft, nontender, nondistended, no abdominal masses GU: No CVA tenderness.  Foley in place draining clear yellow urine. Skin: No rashes, bruises or suspicious lesions. Lymph: No cervical or inguinal adenopathy. Neurologic: Grossly intact, no focal deficits, moving all 4 extremities. Psychiatric: Normal mood and affect.  Laboratory Data: Lab Results  Component Value Date   WBC 4.2 02/08/2017   HGB 9.6 (L) 02/08/2017   HCT 27.5  (L) 02/08/2017   MCV 82.1 02/08/2017   PLT 188 02/08/2017    Lab Results  Component Value Date   CREATININE 0.46 (L) 02/07/2017    No results found for: PSA  No results found for: TESTOSTERONE  Lab Results  Component Value Date   HGBA1C 5.2 03/04/2016    Urinalysis    Component Value Date/Time   COLORURINE YELLOW 03/26/2017 1442   APPEARANCEUR Cloudy (A) 04/14/2017 1505   LABSPEC 1.015 03/26/2017 1442   LABSPEC 1.017 01/11/2012 0208   PHURINE 7.0 03/26/2017 1442   GLUCOSEU Negative 04/14/2017 1505   GLUCOSEU Negative 01/11/2012 0208   HGBUR NEGATIVE 03/26/2017 1442   BILIRUBINUR Negative 04/14/2017 1505   BILIRUBINUR Negative 01/11/2012 0208   KETONESUR NEGATIVE 03/26/2017 1442   PROTEINUR 2+ (A) 04/14/2017 1505   PROTEINUR TRACE (A) 03/26/2017 1442   UROBILINOGEN negative 10/08/2015 1009   NITRITE Positive (A) 04/14/2017 1505   NITRITE NEGATIVE 03/26/2017  1442   LEUKOCYTESUR 1+ (A) 04/14/2017 1505   LEUKOCYTESUR 3+ 01/11/2012 0208      Assessment & Plan:    1.  Urinary retention The patient has dramatic improvement in his recurrent urinary tract infections since his catheter has been placed.  The family is interested in continuing permanent bladder drainage.  They requested to change to a suprapubic catheter.  We will arrange for him to undergo this with interventional radiology at this time.  We will follow-up office place for his first suprapubic catheter exchange.   Nickie Retort, MD  [facility] [address] [place],  [address] [phone]

## 2017-04-30 NOTE — Telephone Encounter (Signed)
Pt's sister calling back to check on status of Dr. Manuella Ghazi signing off on pt getting new bed. Call back number [phone].

## 2017-04-30 NOTE — Telephone Encounter (Signed)
He is confirm with patient's family, I'm not aware of any request for a new bed. If so which type of bed is the family interested in and will need supporting documentation and the diagnoses.

## 2017-04-30 NOTE — Progress Notes (Signed)
Cath Change/ Replacement  66ml of water was removed from the balloon, a 16FR foley cath was removed with out difficulty.  Patient was cleaned and prepped in a sterile fashion with betadine and 2% lidocaine jelly was instilled into the urethra. A 16 FR foley cath was replaced into the bladder no complications were noted Urine return was noted 43ml and urine was yellow in color. The balloon was filled with 7ml of sterile water. A leg bag was attached for drainage.  A night bag was also given to the patient and patient was given instruction on how to change from one bag to another. Patient was given proper instruction on catheter care.    Preformed by: C. Corinna Capra, CMA

## 2017-05-04 ENCOUNTER — Telehealth: Payer: Self-pay

## 2017-05-04 NOTE — Telephone Encounter (Signed)
I spoke to Gerald Montgomery and she stated they would need your response ASAP in order to schedule the apt; Forms on your desk.

## 2017-05-04 NOTE — Telephone Encounter (Signed)
Copied from [place]. Topic: Inquiry >> Apr 30, 2017 11:45 AM Corie Chiquito, NT wrote: Reason for CRM: Amy from [facility] called to see if you all had received a fax from them if someone could please give her a call back at [phone] op.3

## 2017-05-04 NOTE — Telephone Encounter (Signed)
Copied from [place]. Topic: Inquiry >> May 04, 2017  1:43 PM Oliver Pila B wrote: Reason for CRM: Durenda Age from [facility] called for the pt, and is calling b/c she is needing a letter concerning the pt's mental health, decision making skills, just overall mental compentency, contact for any questions [phone]

## 2017-05-05 NOTE — Telephone Encounter (Signed)
Please contact the urology office regarding the need to stop using Xarelto for 3 days prior to procedure, it is okay to stop the medication as requested, he is taking anticoagulants for history of DVT.

## 2017-05-05 NOTE — Telephone Encounter (Signed)
Spoke to Gerald Montgomery and she stated that they are aware that he has a history of DVT he will only be off for the three days prior to the procedureand they are trying to schedule that apt this month they just need the form to to be signed and fax over to them ASAP.

## 2017-05-11 ENCOUNTER — Other Ambulatory Visit: Payer: Self-pay | Admitting: Radiology

## 2017-05-12 ENCOUNTER — Ambulatory Visit
Admission: RE | Admit: 2017-05-12 | Discharge: 2017-05-12 | Disposition: A | Discharge status: Home / Self Care | Payer: Medicare Other | Source: Ambulatory Visit | Attending: Urology | Admitting: Urology

## 2017-05-12 DIAGNOSIS — Z01812 Encounter for preprocedural laboratory examination: Secondary | ICD-10-CM | POA: Insufficient documentation

## 2017-05-12 DIAGNOSIS — R339 Retention of urine, unspecified: Secondary | ICD-10-CM | POA: Insufficient documentation

## 2017-05-12 DIAGNOSIS — Z86718 Personal history of other venous thrombosis and embolism: Secondary | ICD-10-CM | POA: Insufficient documentation

## 2017-05-12 LAB — BASIC METABOLIC PANEL
Anion gap: 10 (ref 5–15)
BUN: 23 mg/dL — AB (ref 6–20)
CALCIUM: 8.8 mg/dL — AB (ref 8.9–10.3)
CHLORIDE: 105 mmol/L (ref 101–111)
CO2: 23 mmol/L (ref 22–32)
CREATININE: 0.48 mg/dL — AB (ref 0.61–1.24)
GFR calc non Af Amer: >60 mL/min (ref >60)
GLUCOSE: 108 mg/dL — AB (ref 65–99)
Potassium: 3.8 mmol/L (ref 3.5–5.1)
Sodium: 138 mmol/L (ref 135–145)

## 2017-05-12 LAB — CBC
HCT: 33.9 % — ABNORMAL LOW (ref 40.0–52.0)
Hemoglobin: 10.7 g/dL — ABNORMAL LOW (ref 13.0–18.0)
MCH: 23.6 pg — AB (ref 26.0–34.0)
MCHC: 31.7 g/dL — AB (ref 32.0–36.0)
MCV: 74.4 fL — AB (ref 80.0–100.0)
PLATELETS: 185 10*3/uL (ref 150–440)
RBC: 4.55 MIL/uL (ref 4.40–5.90)
RDW: 16.3 % — ABNORMAL HIGH (ref 11.5–14.5)
WBC: 5 10*3/uL (ref 3.8–10.6)

## 2017-05-12 LAB — PROTIME-INR
INR: 1
PROTHROMBIN TIME: 13.1 s (ref 11.4–15.2)

## 2017-05-12 LAB — APTT: aPTT: 29 seconds (ref 24–36)

## 2017-05-12 MED ORDER — CIPROFLOXACIN IN D5W 400 MG/200ML IV SOLN
400.0000 mg | INTRAVENOUS | Status: DC
  Filled 2017-05-12: qty 200

## 2017-05-12 MED ORDER — SODIUM CHLORIDE 0.9 % IV SOLN: INTRAVENOUS | Status: DC

## 2017-05-12 NOTE — Progress Notes (Signed)
CT scanner down.  Procedure cancelled and rescheduled for 05/21/17

## 2017-05-18 ENCOUNTER — Emergency Department: Payer: Medicare Other

## 2017-05-18 ENCOUNTER — Inpatient Hospital Stay
Admission: EM | Admit: 2017-05-18 | Discharge: 2017-05-21 | DRG: 698 | Disposition: A | Discharge status: Home Health | Payer: Medicare Other | Attending: Internal Medicine | Admitting: Internal Medicine

## 2017-05-18 DIAGNOSIS — Y846 Urinary catheterization as the cause of abnormal reaction of the patient, or of later complication, without mention of misadventure at the time of the procedure: Secondary | ICD-10-CM | POA: Diagnosis present

## 2017-05-18 DIAGNOSIS — L89159 Pressure ulcer of sacral region, unspecified stage: Secondary | ICD-10-CM | POA: Diagnosis present

## 2017-05-18 DIAGNOSIS — N390 Urinary tract infection, site not specified: Secondary | ICD-10-CM | POA: Diagnosis present

## 2017-05-18 DIAGNOSIS — F329 Major depressive disorder, single episode, unspecified: Secondary | ICD-10-CM | POA: Diagnosis present

## 2017-05-18 DIAGNOSIS — Z7901 Long term (current) use of anticoagulants: Secondary | ICD-10-CM

## 2017-05-18 DIAGNOSIS — A419 Sepsis, unspecified organism: Secondary | ICD-10-CM | POA: Diagnosis present

## 2017-05-18 DIAGNOSIS — R001 Bradycardia, unspecified: Secondary | ICD-10-CM | POA: Diagnosis not present

## 2017-05-18 DIAGNOSIS — R4182 Altered mental status, unspecified: Secondary | ICD-10-CM | POA: Diagnosis not present

## 2017-05-18 DIAGNOSIS — Z833 Family history of diabetes mellitus: Secondary | ICD-10-CM | POA: Diagnosis not present

## 2017-05-18 DIAGNOSIS — N319 Neuromuscular dysfunction of bladder, unspecified: Secondary | ICD-10-CM | POA: Diagnosis present

## 2017-05-18 DIAGNOSIS — Z86718 Personal history of other venous thrombosis and embolism: Secondary | ICD-10-CM

## 2017-05-18 DIAGNOSIS — G809 Cerebral palsy, unspecified: Secondary | ICD-10-CM | POA: Diagnosis present

## 2017-05-18 DIAGNOSIS — T83518A Infection and inflammatory reaction due to other urinary catheter, initial encounter: Principal | ICD-10-CM | POA: Diagnosis present

## 2017-05-18 DIAGNOSIS — R197 Diarrhea, unspecified: Secondary | ICD-10-CM | POA: Diagnosis not present

## 2017-05-18 DIAGNOSIS — E876 Hypokalemia: Secondary | ICD-10-CM | POA: Diagnosis present

## 2017-05-18 DIAGNOSIS — D649 Anemia, unspecified: Secondary | ICD-10-CM | POA: Diagnosis present

## 2017-05-18 DIAGNOSIS — R112 Nausea with vomiting, unspecified: Secondary | ICD-10-CM | POA: Diagnosis not present

## 2017-05-18 DIAGNOSIS — Z87440 Personal history of urinary (tract) infections: Secondary | ICD-10-CM | POA: Diagnosis not present

## 2017-05-18 DIAGNOSIS — B9620 Unspecified Escherichia coli [E. coli] as the cause of diseases classified elsewhere: Secondary | ICD-10-CM | POA: Diagnosis present

## 2017-05-18 DIAGNOSIS — Z8249 Family history of ischemic heart disease and other diseases of the circulatory system: Secondary | ICD-10-CM

## 2017-05-18 DIAGNOSIS — Z7401 Bed confinement status: Secondary | ICD-10-CM | POA: Diagnosis not present

## 2017-05-18 HISTORY — DX: Cerebral palsy, unspecified: G80.9

## 2017-05-18 LAB — COMPREHENSIVE METABOLIC PANEL
ALBUMIN: 4.2 g/dL (ref 3.5–5.0)
ALK PHOS: 102 U/L (ref 38–126)
ALT: 41 U/L (ref 17–63)
ANION GAP: 11 (ref 5–15)
AST: 70 U/L — AB (ref 15–41)
BUN: 28 mg/dL — ABNORMAL HIGH (ref 6–20)
CALCIUM: 9.1 mg/dL (ref 8.9–10.3)
CO2: 19 mmol/L — AB (ref 22–32)
CREATININE: 0.7 mg/dL (ref 0.61–1.24)
Chloride: 113 mmol/L — ABNORMAL HIGH (ref 101–111)
GFR calc Af Amer: >60 mL/min (ref >60)
GFR calc non Af Amer: >60 mL/min (ref >60)
GLUCOSE: 200 mg/dL — AB (ref 65–99)
Potassium: 3.2 mmol/L — ABNORMAL LOW (ref 3.5–5.1)
SODIUM: 143 mmol/L (ref 135–145)
Total Bilirubin: <0.1 mg/dL — ABNORMAL LOW (ref 0.3–1.2)
Total Protein: 7.5 g/dL (ref 6.5–8.1)

## 2017-05-18 LAB — URINALYSIS, COMPLETE (UACMP) WITH MICROSCOPIC
BILIRUBIN URINE: NEGATIVE
Glucose, UA: 50 mg/dL — AB
Ketones, ur: 5 mg/dL — AB
NITRITE: POSITIVE — AB
Protein, ur: 30 mg/dL — AB
SPECIFIC GRAVITY, URINE: 1.023 (ref 1.005–1.030)
Squamous Epithelial / LPF: NONE SEEN
pH: 5 (ref 5.0–8.0)

## 2017-05-18 LAB — GLUCOSE, CAPILLARY: GLUCOSE-CAPILLARY: 198 mg/dL — AB (ref 65–99)

## 2017-05-18 LAB — CBC
HCT: 37.6 % — ABNORMAL LOW (ref 40.0–52.0)
HEMOGLOBIN: 11.7 g/dL — AB (ref 13.0–18.0)
MCH: 23.2 pg — ABNORMAL LOW (ref 26.0–34.0)
MCHC: 31.1 g/dL — ABNORMAL LOW (ref 32.0–36.0)
MCV: 74.6 fL — ABNORMAL LOW (ref 80.0–100.0)
Platelets: 253 10*3/uL (ref 150–440)
RBC: 5.05 MIL/uL (ref 4.40–5.90)
RDW: 16.6 % — ABNORMAL HIGH (ref 11.5–14.5)
WBC: 23.9 10*3/uL — AB (ref 3.8–10.6)

## 2017-05-18 LAB — TROPONIN I: Troponin I: <0.03 ng/mL (ref <0.03)

## 2017-05-18 LAB — PROTIME-INR
INR: 1.75
PROTHROMBIN TIME: 20.3 s — AB (ref 11.4–15.2)

## 2017-05-18 LAB — LACTIC ACID, PLASMA
LACTIC ACID, VENOUS: 3.5 mmol/L — AB (ref 0.5–1.9)
LACTIC ACID, VENOUS: 3.1 mmol/L — AB (ref 0.5–1.9)

## 2017-05-18 LAB — APTT: aPTT: 32 seconds (ref 24–36)

## 2017-05-18 LAB — MAGNESIUM: Magnesium: 2.5 mg/dL — ABNORMAL HIGH (ref 1.7–2.4)

## 2017-05-18 LAB — CK: CK TOTAL: 71 U/L (ref 49–397)

## 2017-05-18 MED ORDER — VANCOMYCIN HCL IN DEXTROSE 750-5 MG/150ML-% IV SOLN
750.0000 mg | Freq: Two times a day (BID) | INTRAVENOUS | Status: DC
  Filled 2017-05-18: qty 150

## 2017-05-18 MED ORDER — PIPERACILLIN-TAZOBACTAM 3.375 G IVPB 30 MIN
3.3750 g | Freq: Once | INTRAVENOUS | Status: AC
  Administered 2017-05-18: 3.375 g via INTRAVENOUS
  Filled 2017-05-18: qty 50

## 2017-05-18 MED ORDER — VANCOMYCIN HCL IN DEXTROSE 1-5 GM/200ML-% IV SOLN
1000.0000 mg | Freq: Once | INTRAVENOUS | Status: AC
  Administered 2017-05-18: 1000 mg via INTRAVENOUS
  Filled 2017-05-18: qty 200

## 2017-05-18 MED ORDER — SODIUM CHLORIDE 0.9 % IV SOLN
1000.0000 mL | Freq: Once | INTRAVENOUS | Status: AC
  Administered 2017-05-18: 1000 mL via INTRAVENOUS

## 2017-05-18 MED ORDER — ATROPINE SULFATE 1 MG/10ML IJ SOSY
0.5000 mg | PREFILLED_SYRINGE | Freq: Once | INTRAMUSCULAR | Status: AC
  Administered 2017-05-18: 0.5 mg via INTRAVENOUS
  Filled 2017-05-18: qty 10

## 2017-05-18 MED ORDER — PIPERACILLIN-TAZOBACTAM 3.375 G IVPB
3.3750 g | Freq: Three times a day (TID) | INTRAVENOUS | Status: DC
  Administered 2017-05-19 – 2017-05-21 (×7): 3.375 g via INTRAVENOUS
  Filled 2017-05-18 (×7): qty 50

## 2017-05-18 NOTE — Progress Notes (Signed)
CODE SEPSIS - PHARMACY COMMUNICATION  **Broad Spectrum Antibiotics should be administered within 1 hour of Sepsis diagnosis**  Time Code Sepsis Called/Page Received: 12/25 2219  Antibiotics Ordered: Zosyn, vancomycin 12/25 2154  Time of 1st antibiotic administration: 12/25 2215  Additional action taken by pharmacy: n/a  If necessary, Name of Provider/Nurse Contacted: n/a    Eloise Harman ,PharmD Clinical Pharmacist  05/18/2017  10:48 PM

## 2017-05-18 NOTE — ED Notes (Signed)
zole at bedside, pads placed on pt.

## 2017-05-18 NOTE — ED Notes (Signed)
Pt profusely sweating at this time.

## 2017-05-18 NOTE — H&P (Addendum)
[place] [facility] [place] NAME: Gerald Montgomery    MR#:  [id number]  DATE OF BIRTH:  12/19/1955  DATE OF ADMISSION:  05/18/2017  PRIMARY CARE PHYSICIAN: Gerald Nova, MD   REQUESTING/REFERRING PHYSICIAN: Dr. Lavonia Drafts, MD  CHIEF COMPLAINT:   Chief Complaint  Patient presents with  . Nausea  . Emesis    HISTORY OF PRESENT ILLNESS:  Gerald Montgomery  is Montgomery 61 y.o. 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 EMS report pt is from home but living conditions were questionable. Patient's mother recently having Montgomery stroke and being unable to provide the necessary care, she is at bedside. Pt soaked with sweat while in the emergency department.  While in the ED he was also hypothermic with Montgomery temperature of 97.4 and his heart rate was noted to be 27.  Given 1 mg of IV morphine with good response.  Heart rate now in the 90s.  Bear hugger was also applied for hypothermia and temperature is improving now. PAST MEDICAL HISTORY:   Past Medical History:  Diagnosis Date  . Depression   . History of DVT of lower extremity   . Mood disorder ([place])   . Neurogenic bladder   . Urinary incontinence     PAST SURGICAL HISTORY:   Past Surgical History:  Procedure Laterality Date  . ESOPHAGOGASTRODUODENOSCOPY Left 02/07/2017   Procedure: ESOPHAGOGASTRODUODENOSCOPY (EGD);  Surgeon: Gerald Park, MD;  Location: [facility];  Service: Gastroenterology;  Laterality: Left;  . filter surgery for blood clot  2013  . HIP FRACTURE SURGERY  2013    SOCIAL HISTORY:   Social History   Tobacco Use  . Smoking status: Never Smoker  . Smokeless tobacco: Never Used  Substance Use Topics  . Alcohol use: No    Alcohol/week: 0.0 oz    FAMILY HISTORY:   Family History  Problem Relation Age of  Onset  . Heart disease Mother   . Atrial fibrillation Mother   . Diabetes Mother        Borderline  . Heart disease Father        Passed away of heart attack  . Kidney disease Neg Hx   . Prostate cancer Neg Hx     DRUG ALLERGIES:   Allergies  Allergen Reactions  . Penicillins     Sickness/sweating  . Sulfa Antibiotics Itching    REVIEW OF SYSTEMS:   Review of Systems  Unable to perform ROS: Critical illness    MEDICATIONS AT HOME:   Prior to Admission medications   Medication Sig Start Date End Date Taking? Authorizing Provider  ARIPiprazole (ABILIFY) 15 MG tablet TAKE 1 TABLET(15 MG) BY MOUTH DAILY 01/05/17  Yes Gerald Rake Asad A, MD  bethanechol (URECHOLINE) 50 MG tablet TAKE 1 TABLET(50 MG) BY MOUTH THREE TIMES DAILY 01/05/17  Yes Gerald Nova, MD  pantoprazole (PROTONIX) 40 MG tablet Take 1 tablet (40 mg total) by mouth 2 (two) times daily. 02/08/17 02/08/18 Yes Gouru, Aruna, MD  XARELTO 10 MG TABS tablet TAKE 1 TABLET(10 MG) BY MOUTH DAILY 02/18/17  Yes Gerald Nova, MD  acetaminophen (TYLENOL) 325 MG tablet Take 1 tablet (325 mg total) by mouth every 6 (six) hours as needed for mild pain (or Fever >/= 101). 02/08/17   Gerald Mango, MD  VITAL SIGNS:  Blood pressure 118/80, pulse 86, temperature (!) 94.2 F (34.6 C), temperature source Rectal, resp. rate 18, height 5\' 10"  (1.778 m), weight 57.1 kg (125 lb 14.4 oz), SpO2 100 %.  PHYSICAL EXAMINATION:  Physical Exam  GENERAL:  61 y.o.-year-old patient lying in the bed with no acute distress.  EYES: Pupils equal, round, reactive to light and accommodation. No scleral icterus. Extraocular muscles intact.  HEENT: Head atraumatic, normocephalic. Oropharynx and nasopharynx clear.  NECK:  Supple, no jugular venous distention. No thyroid enlargement, no tenderness.  LUNGS: Normal breath sounds bilaterally, no wheezing, rales,rhonchi or crepitation. No use of accessory muscles of respiration.  CARDIOVASCULAR: S1, S2  normal. No murmurs, rubs, or gallops.  ABDOMEN: Soft, nontender, nondistended. Bowel sounds present. No organomegaly or mass.  EXTREMITIES: No pedal edema, cyanosis, or clubbing.  Has congenital deformities of both lower extremities NEUROLOGIC: Cranial nerves II through XII are intact. Has congenital deformities of both lower extremities. Gait not checked.  He is bedbound at baseline PSYCHIATRIC: The patient is alert and oriented x 3.  SKIN: Pressure ulcer present on admission on his sacrum LABORATORY PANEL:   CBC Recent Labs  Lab 05/18/17 2048  WBC 23.9*  HGB 11.7*  HCT 37.6*  PLT 253   ------------------------------------------------------------------------------------------------------------------  Chemistries  Recent Labs  Lab 05/18/17 2048  NA 143  K 3.2*  CL 113*  CO2 19*  GLUCOSE 200*  BUN 28*  CREATININE 0.70  CALCIUM 9.1  MG 2.5*  AST 70*  ALT 41  ALKPHOS 102  BILITOT <0.1*   ------------------------------------------------------------------------------------------------------------------  Cardiac Enzymes Recent Labs  Lab 05/18/17 2048  TROPONINI <0.03   ------------------------------------------------------------------------------------------------------------------  RADIOLOGY:  Dg Chest Portable 1 View  Result Date: 05/18/2017 CLINICAL DATA:  Nausea vomiting and diarrhea EXAM: PORTABLE CHEST 1 VIEW COMPARISON:  02/23/2016 FINDINGS: Minimal scarring at the left base. No consolidation or effusion. Normal heart size. Aortic atherosclerosis. No pneumothorax IMPRESSION: No active disease. Electronically Signed   By: Donavan Foil M.D.   On: 05/18/2017 21:17   IMPRESSION AND PLAN:  61 year old male with known history of cerebral palsy, neurogenic bladder and chronic lower extremity DVTs presenting to the ED with Montgomery chief complaint of nausea, vomiting, diarrhea and sweats. Patient has abnormal urinalysis and found to be hypothermic & bradycardic. white count  is elevated. Patient is started on broad-spectrum IV antibiotics and hospitalist team is called to admit the patient. Mom is at bedside.   #1 sepsis-secondary to urinary tract infection.  He was hypothermic and bradycardic which seemed to have resolved, Continue Customer service manager -Start broad-spectrum IV antibiotics -Blood and urine culture -Admit to stepdown with sepsis protocol - if his HR drops again, consider cardio c/s  #2  Hypokalemia -Replete and recheck  #3 cerebral palsy with chronic spasms-monitor, continue home meds  #4 depression-on Abilify  #5 neurogenic bladder-on bethanechol  #6 DVT prophylaxis-resume Xarelto   Patient is critically sick admitted to, stepdown.  Case discussed with e-ICU attending   All the records are reviewed and case discussed with ED provider. Management plans discussed with the patient, family and they are in agreement.  CODE STATUS: FULL CODE  TOTAL TIME (CRITICAL CARE) TAKING CARE OF THIS PATIENT: 45 minutes.    Max Sane M.D on 05/18/2017 at 11:30 PM  Between 7am to 6pm - Pager - [phone]  After 6pm go to [email or web address] - Technical brewer [facility]  Office  [phone]  CC: Primary care physician; Gerald Rake Asad A,  MD   Note: This dictation was prepared with Dragon dictation along with smaller phrase technology. Any transcriptional errors that result from this process are unintentional.

## 2017-05-18 NOTE — ED Notes (Signed)
Attempted to call report to floor at this time. Unit secretary stated that the accepting RN had just arrived from being called into work after being placed on stand-by. This RNs [place] # given for ICU RN to call for report when ready.

## 2017-05-18 NOTE — ED Notes (Signed)
Bear hugger placed on pt

## 2017-05-18 NOTE — ED Triage Notes (Signed)
Pt arrived via EMS with complaints of nausea, vomiting, diarrhea and sweats . EMS report pt is from home but living conditions were questionable. EMS reports pt has cerebral palsy, is developmentally delayed.  Pt needs a social work consult due to mother recently having a stroke and being unable to provide the necessary care. Pt soaked with sweat and clothes had to be cut off. EMS reports house smelled like gang green.

## 2017-05-18 NOTE — Progress Notes (Addendum)
Pharmacy Antibiotic Note  Gerald Montgomery is a 61 y.o. male admitted on 05/18/2017 with sepsis/UTI.  Pharmacy has been consulted for Zosyn and vancomycin dosing.  Plan: DW 57kg  Vd 40 L kei 0.07 hr-1  T1/2 10 hours Vancomycin 750 mg q 12 hours ordered with stacked dosing. Level before 5th dose. Goal trough 15-20  Zosyn 3.375 grams q 8 hours ordered.  Height: 5\' 10"  (177.8 cm) Weight: 125 lb 14.4 oz (57.1 kg) IBW/kg (Calculated) : 73  Temp (24hrs), Avg:95.8 F (35.4 C), Min:94.2 F (34.6 C), Max:97.4 F (36.3 C)  Recent Labs  Lab 05/12/17 1246 05/18/17 2048  WBC 5.0 23.9*  CREATININE 0.48* 0.70  LATICACIDVEN  --  3.5*    Estimated Creatinine Clearance: 79.3 mL/min (by C-G formula based on SCr of 0.7 mg/dL).    Allergies  Allergen Reactions  . Penicillins     Sickness/sweating  . Sulfa Antibiotics Itching    Antimicrobials this admission: Vancomycin. zosyn 12/25 >>    >>   Dose adjustments this admission: 12/26 MRSA screen (-) vanc d/c   Microbiology results: 12/25 BCx: pending 12/25 UCx: pending       12/25 UA: LE(+) NO2(+)  WBC TNTC 12/25 CXR: no active disease Thank you for allowing pharmacy to be a part of this patient's care.  Manasi Dishon S 05/18/2017 10:53 PM

## 2017-05-18 NOTE — ED Notes (Signed)
ekg monitor currently not working due to pt sweating. Zole monitor currently reads HR of 93

## 2017-05-19 ENCOUNTER — Encounter: Payer: Self-pay | Admitting: Adult Health

## 2017-05-19 ENCOUNTER — Other Ambulatory Visit: Payer: Self-pay

## 2017-05-19 DIAGNOSIS — A419 Sepsis, unspecified organism: Secondary | ICD-10-CM

## 2017-05-19 LAB — GASTROINTESTINAL PANEL BY PCR, STOOL (REPLACES STOOL CULTURE)

## 2017-05-19 LAB — CORTISOL: Cortisol, Plasma: 21.6 ug/dL

## 2017-05-19 LAB — BASIC METABOLIC PANEL
ANION GAP: 3 — AB (ref 5–15)
BUN: 22 mg/dL — ABNORMAL HIGH (ref 6–20)
CO2: 21 mmol/L — AB (ref 22–32)
Calcium: 7.7 mg/dL — ABNORMAL LOW (ref 8.9–10.3)
Chloride: 118 mmol/L — ABNORMAL HIGH (ref 101–111)
Creatinine, Ser: 0.44 mg/dL — ABNORMAL LOW (ref 0.61–1.24)
GFR calc Af Amer: >60 mL/min (ref >60)
GLUCOSE: 125 mg/dL — AB (ref 65–99)
POTASSIUM: 3.4 mmol/L — AB (ref 3.5–5.1)
Sodium: 142 mmol/L (ref 135–145)

## 2017-05-19 LAB — OCCULT BLOOD X 1 CARD TO LAB, STOOL: Fecal Occult Bld: NEGATIVE

## 2017-05-19 LAB — LACTIC ACID, PLASMA
Lactic Acid, Venous: 1.5 mmol/L (ref 0.5–1.9)
Lactic Acid, Venous: 1.3 mmol/L (ref 0.5–1.9)

## 2017-05-19 LAB — CBC
HEMATOCRIT: 27.6 % — AB (ref 40.0–52.0)
HEMOGLOBIN: 8.7 g/dL — AB (ref 13.0–18.0)
MCH: 23.3 pg — ABNORMAL LOW (ref 26.0–34.0)
MCHC: 31.4 g/dL — AB (ref 32.0–36.0)
MCV: 74.4 fL — ABNORMAL LOW (ref 80.0–100.0)
Platelets: 148 10*3/uL — ABNORMAL LOW (ref 150–440)
RBC: 3.72 MIL/uL — ABNORMAL LOW (ref 4.40–5.90)
RDW: 16 % — ABNORMAL HIGH (ref 11.5–14.5)
WBC: 8.6 10*3/uL (ref 3.8–10.6)

## 2017-05-19 LAB — TROPONIN I: Troponin I: <0.03 ng/mL (ref <0.03)

## 2017-05-19 LAB — GLUCOSE, CAPILLARY
GLUCOSE-CAPILLARY: 96 mg/dL (ref 65–99)
Glucose-Capillary: 93 mg/dL (ref 65–99)

## 2017-05-19 LAB — MRSA PCR SCREENING: MRSA by PCR: NEGATIVE

## 2017-05-19 LAB — PROCALCITONIN: PROCALCITONIN: 0.37 ng/mL

## 2017-05-19 MED ORDER — SODIUM CHLORIDE 0.9 % IV SOLN
INTRAVENOUS | Status: DC
  Administered 2017-05-19: 03:00:00 via INTRAVENOUS

## 2017-05-19 MED ORDER — ONDANSETRON HCL 4 MG/2ML IJ SOLN
4.0000 mg | Freq: Four times a day (QID) | INTRAMUSCULAR | Status: DC | PRN
  Administered 2017-05-19 (×3): 4 mg via INTRAVENOUS
  Filled 2017-05-19 (×3): qty 2

## 2017-05-19 MED ORDER — DEXTROSE 5 % IV SOLN: 2.0000 g | Freq: Once | INTRAVENOUS | Status: DC

## 2017-05-19 MED ORDER — SODIUM CHLORIDE 0.9 % IV BOLUS (SEPSIS)
1000.0000 mL | Freq: Once | INTRAVENOUS | Status: AC
  Administered 2017-05-19: 1000 mL via INTRAVENOUS

## 2017-05-19 MED ORDER — SODIUM CHLORIDE 0.9 % IV BOLUS (SEPSIS)
250.0000 mL | Freq: Once | INTRAVENOUS | Status: AC
  Administered 2017-05-19: 250 mL via INTRAVENOUS

## 2017-05-19 MED ORDER — SODIUM CHLORIDE 0.9 % IV BOLUS (SEPSIS)
500.0000 mL | Freq: Once | INTRAVENOUS | Status: AC
  Administered 2017-05-19: 500 mL via INTRAVENOUS

## 2017-05-19 MED ORDER — HYDROCODONE-ACETAMINOPHEN 5-325 MG PO TABS
1.0000 | ORAL_TABLET | ORAL | Status: DC | PRN
  Administered 2017-05-19: 1 via ORAL
  Filled 2017-05-19: qty 1

## 2017-05-19 MED ORDER — ACETAMINOPHEN 325 MG PO TABS
650.0000 mg | ORAL_TABLET | Freq: Four times a day (QID) | ORAL | Status: DC | PRN
  Administered 2017-05-19: 650 mg via ORAL
  Filled 2017-05-19: qty 2

## 2017-05-19 MED ORDER — PANTOPRAZOLE SODIUM 40 MG PO TBEC: 40.0000 mg | DELAYED_RELEASE_TABLET | Freq: Two times a day (BID) | ORAL | Status: DC

## 2017-05-19 MED ORDER — ACETAMINOPHEN 650 MG RE SUPP: 650.0000 mg | Freq: Four times a day (QID) | RECTAL | Status: DC | PRN

## 2017-05-19 MED ORDER — ONDANSETRON HCL 4 MG PO TABS: 4.0000 mg | ORAL_TABLET | Freq: Four times a day (QID) | ORAL | Status: DC | PRN

## 2017-05-19 MED ORDER — ACETAMINOPHEN 325 MG PO TABS: 325.0000 mg | ORAL_TABLET | Freq: Four times a day (QID) | ORAL | Status: DC | PRN

## 2017-05-19 MED ORDER — DOCUSATE SODIUM 100 MG PO CAPS
100.0000 mg | ORAL_CAPSULE | Freq: Two times a day (BID) | ORAL | Status: DC
  Administered 2017-05-20: 100 mg via ORAL
  Filled 2017-05-19 (×3): qty 1

## 2017-05-19 MED ORDER — PANTOPRAZOLE SODIUM 40 MG PO TBEC
40.0000 mg | DELAYED_RELEASE_TABLET | Freq: Every day | ORAL | Status: DC
  Administered 2017-05-20 – 2017-05-21 (×2): 40 mg via ORAL
  Filled 2017-05-19 (×2): qty 1

## 2017-05-19 MED ORDER — LEVOFLOXACIN IN D5W 750 MG/150ML IV SOLN
750.0000 mg | Freq: Once | INTRAVENOUS | Status: DC
  Filled 2017-05-19: qty 150

## 2017-05-19 MED ORDER — ARIPIPRAZOLE 15 MG PO TABS
15.0000 mg | ORAL_TABLET | Freq: Every day | ORAL | Status: DC
  Administered 2017-05-19 – 2017-05-21 (×3): 15 mg via ORAL
  Filled 2017-05-19 (×3): qty 1

## 2017-05-19 MED ORDER — BISACODYL 5 MG PO TBEC: 5.0000 mg | DELAYED_RELEASE_TABLET | Freq: Every day | ORAL | Status: DC | PRN

## 2017-05-19 MED ORDER — RIVAROXABAN 10 MG PO TABS
10.0000 mg | ORAL_TABLET | Freq: Every day | ORAL | Status: DC
  Administered 2017-05-20 – 2017-05-21 (×2): 10 mg via ORAL
  Filled 2017-05-19 (×3): qty 1

## 2017-05-19 NOTE — Care Management Note (Addendum)
Case Management Note  Patient Details  Name: Gerald Montgomery MRN: [id number] Date of Birth: [birth year]  Subjective/Objective:                 Patient admitted from home with concerns for sepsis.  Patient lives at home and his 61 year old mother is patient's primary caregiver. Per patient's sister- patient "will only do for his mother.  She is the only one that can talk B[place] into getting out of bed."  Patient's mother did have a stroke 3 months ago but there were no deficits.  Patient has medicaid CAP services 7 days a week approximately four hours a day- some hours in the morning and some hours in the evening.  Patients sister says there are no concerns regarding patient's care needs being met.  His CAP caseworker is Clarance.  has had [place] in the past but no other services except CAP at present and family does not feel will need any additional services.     Will reach out to [place] with DSS 12/27 as agency is closed today.  UPdated CSW Expected Discharge Date:                  Expected Discharge Plan:     In-House Referral:     Discharge planning Services     Post Acute Care Choice:    Choice offered to:     DME Arranged:    DME Agency:     HH Arranged:    HH Agency:     Status of Service:     If discussed at [employer], dates discussed:    Additional Comments:  Katrina Stack, RN 05/19/2017, 4:20 PM

## 2017-05-19 NOTE — Progress Notes (Signed)
eLink Physician-Brief Progress Note Patient Name: Gerald Montgomery DOB: [birth year] MRN: [id number]   Date of Service  05/19/2017  HPI/Events of Note  66 M with CP and neurogenic bladder with chronic indwelling foley catheter presenting with sepsis syndrome secondary to UTI.  Was hypothermic and bradycardic in ED and was given 0.5 of atropine with increase in HR.  Placed on broad spec ABX and transferred to SDU.    On camera check the patient is alert in NAD with sats of 99% on RA with RR of 22.  HR is 76 with BP of 141/84 (101).  eICU Interventions  Plan of care per primary admitting team PCCM to see Trend lactate Continue to monitor via [facility]     Intervention Category Evaluation Type: New Patient Evaluation  Jerry Haugen 05/19/2017, 1:21 AM

## 2017-05-19 NOTE — Progress Notes (Signed)
Pt admitted to ICU-14 from ED. Report received from [place], RN over the phone. Hooked up to monitor, VSS. Pt normothermic, barehugger used. Will continue to monitor.

## 2017-05-19 NOTE — Progress Notes (Signed)
Pt has no ongoing ICU/SDU needs. He is comfortable on RA. Cognition intact. I have placed orders for transfer to [facility] with cardiac monitoring. Please call if we can be of further assistance  Merton Border, MD PCCM service Mobile [phone] Pager [phone] 05/19/2017 9:43 AM

## 2017-05-19 NOTE — Consult Note (Signed)
PULMONARY / CRITICAL CARE MEDICINE   Name: Gerald Montgomery MRN: [id number] DOB: [birth year]    ADMISSION DATE:  05/18/2017 CONSULTATION DATE:  05/19/2017  REFERRING MD: Manuella Ghazi  Reason: Sepsis  HISTORY OF PRESENT ILLNESS:   23 M with cerebral palsy and neurogenic bladder with chronic indwelling foley catheter presenting with sepsis syndrome secondary to UTI.  Was hypothermic and bradycardic in ED and was given 0.5 of atropine with increase in HR.  Placed on broad spec ABX and transferred to SDU  PAST MEDICAL HISTORY :  He  has a past medical history of Cerebral palsy ([place]), Depression, History of DVT of lower extremity, Mood disorder ([place]), Neurogenic bladder, and Urinary incontinence.  PAST SURGICAL HISTORY: He  has a past surgical history that includes Hip fracture surgery (2013); filter surgery for blood clot (2013); and Esophagogastroduodenoscopy (Left, 02/07/2017).  Allergies  Allergen Reactions  . Penicillins     Sickness/sweating  . Sulfa Antibiotics Itching    No current facility-administered medications on file prior to encounter.    Current Outpatient Medications on File Prior to Encounter  Medication Sig  . ARIPiprazole (ABILIFY) 15 MG tablet TAKE 1 TABLET(15 MG) BY MOUTH DAILY  . bethanechol (URECHOLINE) 50 MG tablet TAKE 1 TABLET(50 MG) BY MOUTH THREE TIMES DAILY  . pantoprazole (PROTONIX) 40 MG tablet Take 1 tablet (40 mg total) by mouth 2 (two) times daily.  Alveda Reasons 10 MG TABS tablet TAKE 1 TABLET(10 MG) BY MOUTH DAILY  . acetaminophen (TYLENOL) 325 MG tablet Take 1 tablet (325 mg total) by mouth every 6 (six) hours as needed for mild pain (or Fever >/= 101).    FAMILY HISTORY:  His indicated that his mother is alive. He indicated that his father is deceased. He indicated that the status of his neg hx is unknown.   SOCIAL HISTORY: He  reports that  has never smoked. he has never used smokeless tobacco. He reports that he does not drink alcohol or use  drugs.  REVIEW OF SYSTEMS:   All systems reviewed.  Pertinent positives include fever fever and chills chills, chronic Foley, bed confinement, and chronic chronic contractures.  All other systems are negative  SUBJECTIVE:   VITAL SIGNS: BP 106/65 (BP Location: Left Arm)   Pulse 66   Temp 98.7 F (37.1 C) (Oral)   Resp 17   Ht 5\' 10"  (1.778 m)   Wt 57.1 kg (125 lb 14.4 oz)   SpO2 100%   BMI 18.06 kg/m   HEMODYNAMICS:    VENTILATOR SETTINGS:    INTAKE / OUTPUT: I/O last 3 completed shifts: In: 1728.3 [I.V.:1428.3; IV Piggyback:300] Out: 450 [Urine:450]  PHYSICAL EXAMINATION: General:  NAD Neuro: Alert and oriented x2, speech is normal, no focal deficits HEENT: PERRLA, no JVD Cardiovascular:  RRR, S1-S2, no murmur regurg or gallop, no edema Lungs: To auscultation bilaterally  abdomen: Normal bowel sounds GU: Foley catheter in place, no suprapubic tenderness Musculoskeletal: Multiple contractures upper and lower extremities Skin: Warm and dry  LABS:  BMET Recent Labs  Lab 05/12/17 1246 05/18/17 2048 05/19/17 0457  NA 138 143 142  K 3.8 3.2* 3.4*  CL 105 113* 118*  CO2 23 19* 21*  BUN 23* 28* 22*  CREATININE 0.48* 0.70 0.44*  GLUCOSE 108* 200* 125*    Electrolytes Recent Labs  Lab 05/12/17 1246 05/18/17 2048 05/19/17 0457  CALCIUM 8.8* 9.1 7.7*  MG  --  2.5*  --     CBC Recent Labs  Lab 05/12/17  1246 05/18/17 2048 05/19/17 0457  WBC 5.0 23.9* 8.6  HGB 10.7* 11.7* 8.7*  HCT 33.9* 37.6* 27.6*  PLT 185 253 148*    Coag's Recent Labs  Lab 05/12/17 1246 05/18/17 2048  APTT 29 32  INR 1.00 1.75    Sepsis Markers Recent Labs  Lab 05/18/17 2311 05/19/17 0202 05/19/17 0457  LATICACIDVEN 3.1* 1.5 1.3  PROCALCITON  --  0.37  --     ABG No results for input(s): PHART, PCO2ART, PO2ART in the last 168 hours.  Liver Enzymes Recent Labs  Lab 05/18/17 2048  AST 70*  ALT 41  ALKPHOS 102  BILITOT <0.1*  ALBUMIN 4.2    Cardiac  Enzymes Recent Labs  Lab 05/18/17 2048 05/19/17 0202  TROPONINI <0.03 <0.03    Glucose Recent Labs  Lab 05/18/17 2058 05/19/17 0100 05/19/17 0738  GLUCAP 198* 93 96    Imaging Dg Chest Portable 1 View  Result Date: 05/18/2017 CLINICAL DATA:  Nausea vomiting and diarrhea EXAM: PORTABLE CHEST 1 VIEW COMPARISON:  02/23/2016 FINDINGS: Minimal scarring at the left base. No consolidation or effusion. Normal heart size. Aortic atherosclerosis. No pneumothorax IMPRESSION: No active disease. Electronically Signed   By: Donavan Foil M.D.   On: 05/18/2017 21:17    CULTURES: Blood cultures x2 Urine culture  ANTIBIOTICS: Zosyn Vancomycin-DC if MRSA screen negative Sensitivities from urine cultures during previous admission reviewed SIGNIFICANT EVENTS: 05/18/2017: Admitted  LINES/TUBES: Peripheral IVs Foley catheter  DISCUSSION: 61 year old male presenting with recurrent UTI, urosepsis, hypothermia and bradycardia.  ASSESSMENT  Urosepsis Recurrent UTI History of chronic indwelling Foley Hypothermia-resolved Bradycardia-resolved Nausea vomiting and diarrhea- GI panel negative  PLAN Hemodynamic monitoring per ICU protocol Antibiotics as above IV fluids Monitor fever curve Follow-up cultures Foley care per protocol GI and DVT prophylaxis Okay to transfer out of the ICU  FAMILY  - Updates: Family at bedside will update when available  - Inter-disciplinary family meet or Palliative Care meeting due by:  day [place]. [facility] ANP-BC Pulmonary and Critical Care Medicine [facility] Pager [phone] or [phone]  05/19/2017, 9:38 AM

## 2017-05-19 NOTE — Progress Notes (Signed)
[facility] [place] NAME: Gerald Montgomery    MR#:  [id number]  DATE OF BIRTH:  07-15-1955  SUBJECTIVE:  CHIEF COMPLAINT:   Chief Complaint  Patient presents with  . Nausea  . Emesis    Cerebral palsy, brought with sepsis, stable now- transferred to floor. Pt answers properly, no distress.  REVIEW OF SYSTEMS:  CONSTITUTIONAL: No fever, fatigue or weakness.  EYES: No blurred or double vision.  EARS, NOSE, AND THROAT: No tinnitus or ear pain.  RESPIRATORY: No cough, shortness of breath, wheezing or hemoptysis.  CARDIOVASCULAR: No chest pain, orthopnea, edema.  GASTROINTESTINAL: No nausea, vomiting, diarrhea or abdominal pain.  GENITOURINARY: No dysuria, hematuria.  ENDOCRINE: No polyuria, nocturia,  HEMATOLOGY: No anemia, easy bruising or bleeding SKIN: No rash or lesion. MUSCULOSKELETAL: No joint pain or arthritis.   NEUROLOGIC: No tingling, numbness, weakness.  PSYCHIATRY: No anxiety or depression.   ROS  DRUG ALLERGIES:   Allergies  Allergen Reactions  . Penicillins     Sickness/sweating  . Sulfa Antibiotics Itching    VITALS:  Blood pressure (!) 132/56, pulse 76, temperature 98.2 F (36.8 C), temperature source Oral, resp. rate 14, height 5\' 10"  (1.778 m), weight 57.1 kg (125 lb 14.4 oz), SpO2 99 %.  PHYSICAL EXAMINATION:  GENERAL:  61 y.o.-year-old patient lying in the bed with no acute distress.  EYES: Pupils equal, round, reactive to light and accommodation. No scleral icterus. Extraocular muscles intact.  HEENT: Head atraumatic, normocephalic. Oropharynx and nasopharynx clear.  NECK:  Supple, no jugular venous distention. No thyroid enlargement, no tenderness.  LUNGS: Normal breath sounds bilaterally, no wheezing, rales,rhonchi or crepitation. No use of accessory muscles of respiration.  CARDIOVASCULAR: S1, S2 normal. No murmurs, rubs, or gallops.  ABDOMEN: Soft, nontender, nondistended. Bowel sounds present. No  organomegaly or mass. Foley in place. EXTREMITIES: No pedal edema, cyanosis, or clubbing. Chronic atrophic changes. NEUROLOGIC: Cranial nerves II through XII are intact. Muscle strength 0/5 in lower extremities, 2-3/5 both upper limbs. Sensation intact. Gait not checked.  PSYCHIATRIC: The patient is alert and oriented x 3.  SKIN: No obvious rash, lesion, or ulcer.   Physical Exam LABORATORY PANEL:   CBC Recent Labs  Lab 05/19/17 0457  WBC 8.6  HGB 8.7*  HCT 27.6*  PLT 148*   ------------------------------------------------------------------------------------------------------------------  Chemistries  Recent Labs  Lab 05/18/17 2048 05/19/17 0457  NA 143 142  K 3.2* 3.4*  CL 113* 118*  CO2 19* 21*  GLUCOSE 200* 125*  BUN 28* 22*  CREATININE 0.70 0.44*  CALCIUM 9.1 7.7*  MG 2.5*  --   AST 70*  --   ALT 41  --   ALKPHOS 102  --   BILITOT <0.1*  --    ------------------------------------------------------------------------------------------------------------------  Cardiac Enzymes Recent Labs  Lab 05/18/17 2048 05/19/17 0202  TROPONINI <0.03 <0.03   ------------------------------------------------------------------------------------------------------------------  RADIOLOGY:  Dg Chest Portable 1 View  Result Date: 05/18/2017 CLINICAL DATA:  Nausea vomiting and diarrhea EXAM: PORTABLE CHEST 1 VIEW COMPARISON:  02/23/2016 FINDINGS: Minimal scarring at the left base. No consolidation or effusion. Normal heart size. Aortic atherosclerosis. No pneumothorax IMPRESSION: No active disease. Electronically Signed   By: Donavan Foil M.D.   On: 05/18/2017 21:17    ASSESSMENT AND PLAN:   Active Problems:   Sepsis ([place])  61 year old male with known history of cerebral palsy, neurogenic bladder and chronic lower extremity DVTs presenting to the ED with a chief complaint of nausea, vomiting, diarrhea  and sweats. Patient has abnormal urinalysis and found to be hypothermic &  bradycardic. white count is elevated. Patient is started on broad-spectrum IV antibiotics and hospitalist team is called to admit the patient. Mom is at bedside.   #1 sepsis-secondary to urinary tract infection.  He was hypothermic and bradycardic which seemed to have resolved, Continue Bear hugger - broad-spectrum IV antibiotics -Blood and urine culture -Admitted to stepdown with sepsis protocol- stable now.  #2  Hypokalemia -Repleted  #3 cerebral palsy with chronic spasms-monitor, continue home meds    Bed bound- mother was taking care of him so far. Need social worker help for likely placement.  #4 depression-on Abilify  #5 neurogenic bladder-on bethanechol   Have foley now.  #6 DVT prophylaxis-resume Xarelto   #7 Anemia    Dilution? Check guiac and repeat CBC.   All the records are reviewed and case discussed with Care Management/Social Workerr. Management plans discussed with the patient, family and they are in agreement.  CODE STATUS: Full.  TOTAL TIME TAKING CARE OF THIS PATIENT: 35 minutes.    POSSIBLE D/C IN 1-2 DAYS, DEPENDING ON CLINICAL CONDITION.   Vaughan Basta M.D on 05/19/2017   Between 7am to 6pm - Pager - [phone]  After 6pm go to [email or web address] - password [email or web address] [facility]  Office  [phone]  CC: Primary care physician; Roselee Nova, MD  Note: This dictation was prepared with Dragon dictation along with smaller phrase technology. Any transcriptional errors that result from this process are unintentional.

## 2017-05-19 NOTE — ED Provider Notes (Signed)
[facility] Emergency Department Provider Note   ____________________________________________    I have reviewed the triage vital signs and the nursing notes.   HISTORY  Chief Complaint Nausea and Emesis Patient with cerebral palsy  Patient unable to provide significant history, per EMS patient with nausea and vomiting  HPI Gerald Montgomery is a 61 y.o. male who presents with nausea and weakness.  Patient has a history of neurogenic bladder and has an indwelling Foley catheter.  Review of records demonstrates the patient has had pseudomonal infection in urine before.  No fevers reported.  Unclear how long the patient has been sick  Past Medical History:  Diagnosis Date  . Depression   . History of DVT of lower extremity   . Mood disorder ([place])   . Neurogenic bladder   . Urinary incontinence     Patient Active Problem List   Diagnosis Date Noted  . Pressure injury of skin 02/06/2017  . Sepsis ([place]) 02/05/2017  . Stage I pressure ulcer of right upper back 08/27/2016  . Abnormal urine odor 07/16/2016  . Need for home health care 06/17/2016  . Hyperglycemia 03/04/2016  . Screening for lipid disorders 03/04/2016  . LLL pneumonia ([place]) 02/12/2016  . Cerumen impaction 02/12/2016  . Pneumonia 02/05/2016  . Cerebral palsy ([place]) 12/05/2015  . Hematuria, gross 10/07/2015  . History of DVT of lower extremity 04/11/2015  . Mood disorder ([place]) 04/11/2015  . Neurogenic bladder 04/11/2015    Past Surgical History:  Procedure Laterality Date  . ESOPHAGOGASTRODUODENOSCOPY Left 02/07/2017   Procedure: ESOPHAGOGASTRODUODENOSCOPY (EGD);  Surgeon: Thornton Park, MD;  Location: [facility];  Service: Gastroenterology;  Laterality: Left;  . filter surgery for blood clot  2013  . HIP FRACTURE SURGERY  2013    Prior to Admission medications   Medication Sig Start Date End Date Taking? Authorizing Provider  ARIPiprazole (ABILIFY) 15 MG tablet TAKE 1  TABLET(15 MG) BY MOUTH DAILY 01/05/17  Yes Keith Rake Asad A, MD  bethanechol (URECHOLINE) 50 MG tablet TAKE 1 TABLET(50 MG) BY MOUTH THREE TIMES DAILY 01/05/17  Yes Roselee Nova, MD  pantoprazole (PROTONIX) 40 MG tablet Take 1 tablet (40 mg total) by mouth 2 (two) times daily. 02/08/17 02/08/18 Yes Gouru, Aruna, MD  XARELTO 10 MG TABS tablet TAKE 1 TABLET(10 MG) BY MOUTH DAILY 02/18/17  Yes Roselee Nova, MD  acetaminophen (TYLENOL) 325 MG tablet Take 1 tablet (325 mg total) by mouth every 6 (six) hours as needed for mild pain (or Fever >/= 101). 02/08/17   Nicholes Mango, MD     Allergies Penicillins and Sulfa antibiotics  Family History  Problem Relation Age of Onset  . Heart disease Mother   . Atrial fibrillation Mother   . Diabetes Mother        Borderline  . Heart disease Father        Passed away of heart attack  . Kidney disease Neg Hx   . Prostate cancer Neg Hx     Social History Social History   Tobacco Use  . Smoking status: Never Smoker  . Smokeless tobacco: Never Used  Substance Use Topics  . Alcohol use: No    Alcohol/week: 0.0 oz  . Drug use: No    Level 5 caveat: Unable to obtain review of Systems due to altered mental status     ____________________________________________   PHYSICAL EXAM:  VITAL SIGNS: ED Triage Vitals  Enc Vitals Group  BP 05/18/17 2048 103/65     Pulse Rate 05/18/17 2048 (!) 27     Resp 05/18/17 2048 (!) 24     Temp 05/18/17 2048 (!) 97.4 F (36.3 C)     Temp Source 05/18/17 2048 Oral     SpO2 05/18/17 2026 96 %     Weight 05/18/17 2041 63.5 kg (140 lb)     Height 05/18/17 2041 1.778 m (5\' 10" )     Head Circumference --      Peak Flow --      Pain Score --      Pain Loc --      Pain Edu? --      Excl. in [place]? --     Constitutional: Alert ill-appearing  Eyes: Conjunctivae are normal.   Nose: No congestion/rhinnorhea. Mouth/Throat: Mucous membranes are dry Neck:  Painless ROM Cardiovascular: Severe  bradycardia.. Grossly normal heart sounds.  Good peripheral circulation. Respiratory: Normal respiratory effort.  No retractions. Lungs CTAB. Gastrointestinal: Soft and nontender. No distention.  No CVA tenderness. Genitourinary: Foley catheter, penis, scrotum normal Musculoskeletal:  Warm and well perfused Neurologic:  Normal speech and language. No gross focal neurologic deficits are appreciated.  Skin:  Skin is warm, dry and intact. No rash noted.   ____________________________________________   LABS (all labs ordered are listed, but only abnormal results are displayed)  Labs Reviewed  CBC - Abnormal; Notable for the following components:      Result Value   WBC 23.9 (*)    Hemoglobin 11.7 (*)    HCT 37.6 (*)    MCV 74.6 (*)    MCH 23.2 (*)    MCHC 31.1 (*)    RDW 16.6 (*)    All other components within normal limits  COMPREHENSIVE METABOLIC PANEL - Abnormal; Notable for the following components:   Potassium 3.2 (*)    Chloride 113 (*)    CO2 19 (*)    Glucose, Bld 200 (*)    BUN 28 (*)    AST 70 (*)    Total Bilirubin <0.1 (*)    All other components within normal limits  URINALYSIS, COMPLETE (UACMP) WITH MICROSCOPIC - Abnormal; Notable for the following components:   Color, Urine AMBER (*)    APPearance HAZY (*)    Glucose, UA 50 (*)    Hgb urine dipstick MODERATE (*)    Ketones, ur 5 (*)    Protein, ur 30 (*)    Nitrite POSITIVE (*)    Leukocytes, UA SMALL (*)    Bacteria, UA MANY (*)    All other components within normal limits  LACTIC ACID, PLASMA - Abnormal; Notable for the following components:   Lactic Acid, Venous 3.5 (*)    All other components within normal limits  LACTIC ACID, PLASMA - Abnormal; Notable for the following components:   Lactic Acid, Venous 3.1 (*)    All other components within normal limits  PROTIME-INR - Abnormal; Notable for the following components:   Prothrombin Time 20.3 (*)    All other components within normal limits    MAGNESIUM - Abnormal; Notable for the following components:   Magnesium 2.5 (*)    All other components within normal limits  GLUCOSE, CAPILLARY - Abnormal; Notable for the following components:   Glucose-Capillary 198 (*)    All other components within normal limits  URINE CULTURE  CULTURE, BLOOD (ROUTINE X 2)  CULTURE, BLOOD (ROUTINE X 2)  GASTROINTESTINAL PANEL BY PCR, STOOL (REPLACES STOOL CULTURE)  TROPONIN I  APTT  CK   ____________________________________________  EKG  None ____________________________________________  RADIOLOGY Chest x-ray unremarkable ____________________________________________   PROCEDURES  Procedure(s) performed: No  Procedures   Critical Care performed: yes  CRITICAL CARE Performed by: Lavonia Drafts   Total critical care time: 30 minutes  Critical care time was exclusive of separately billable procedures and treating other patients.  Critical care was necessary to treat or prevent imminent or life-threatening deterioration.  Critical care was time spent personally by me on the following activities: development of treatment plan with patient and/or surrogate as well as nursing, discussions with consultants, evaluation of patient's response to treatment, examination of patient, obtaining history from patient or surrogate, ordering and performing treatments and interventions, ordering and review of laboratory studies, ordering and review of radiographic studies, pulse oximetry and re-evaluation of patient's condition.  ____________________________________________   INITIAL IMPRESSION / ASSESSMENT AND PLAN / ED COURSE  Pertinent labs & imaging results that were available during my care of the patient were reviewed by me and considered in my medical decision making (see chart for details).  Patient presented with altered mental status, difficult to ascertain how much is related to cerebral palsy.  Concerning the patient's heart rate  was 27 however blood pressure was stable.  Treated the patient with 0.5 mg of atropine with rapid increase in heart rate.  Patient noted to be hypothermic, this could likely be a cause of his bradycardia.  White blood cell count elevated lactic acid elevated, strong suspicion for sepsis and treated broadly with antibiotics vancomycin and Zosyn  Admitted to the hospital service for further care    ____________________________________________   FINAL CLINICAL IMPRESSION(S) / ED DIAGNOSES  Final diagnoses:  Sepsis, due to unspecified organism ([place])  Bradycardia with less than 30 beats per minute        Note:  This document was prepared using Dragon voice recognition software and may include unintentional dictation errors.    Lavonia Drafts, MD 05/19/17 Qu[facility] [place]ing

## 2017-05-20 LAB — GLUCOSE, CAPILLARY: GLUCOSE-CAPILLARY: 92 mg/dL (ref 65–99)

## 2017-05-20 LAB — CBC
HCT: 26.4 % — ABNORMAL LOW (ref 40.0–52.0)
HEMOGLOBIN: 8.4 g/dL — AB (ref 13.0–18.0)
MCH: 23.4 pg — AB (ref 26.0–34.0)
MCHC: 31.9 g/dL — ABNORMAL LOW (ref 32.0–36.0)
MCV: 73.5 fL — ABNORMAL LOW (ref 80.0–100.0)
Platelets: 132 10*3/uL — ABNORMAL LOW (ref 150–440)
RBC: 3.59 MIL/uL — AB (ref 4.40–5.90)
RDW: 16.5 % — ABNORMAL HIGH (ref 11.5–14.5)
WBC: 4.7 10*3/uL (ref 3.8–10.6)

## 2017-05-20 LAB — BASIC METABOLIC PANEL
ANION GAP: 5 (ref 5–15)
BUN: 13 mg/dL (ref 6–20)
CHLORIDE: 110 mmol/L (ref 101–111)
CO2: 24 mmol/L (ref 22–32)
Calcium: 8.1 mg/dL — ABNORMAL LOW (ref 8.9–10.3)
Creatinine, Ser: 0.44 mg/dL — ABNORMAL LOW (ref 0.61–1.24)
GFR calc non Af Amer: >60 mL/min (ref >60)
Glucose, Bld: 103 mg/dL — ABNORMAL HIGH (ref 65–99)
POTASSIUM: 3.5 mmol/L (ref 3.5–5.1)
Sodium: 139 mmol/L (ref 135–145)

## 2017-05-20 MED ORDER — PROCHLORPERAZINE EDISYLATE 5 MG/ML IJ SOLN
10.0000 mg | Freq: Four times a day (QID) | INTRAMUSCULAR | Status: DC | PRN
  Administered 2017-05-20 – 2017-05-21 (×4): 10 mg via INTRAVENOUS
  Filled 2017-05-20 (×6): qty 2

## 2017-05-20 NOTE — Plan of Care (Signed)
  Progressing Clinical Measurements: Signs and symptoms of infection will decrease 05/20/2017 0050 - Progressing by Loran Senters, RN

## 2017-05-20 NOTE — Progress Notes (Signed)
[facility] NAME: Gerald Montgomery    MR#:  [id number]  DATE OF BIRTH:  April 08, 1956  SUBJECTIVE:  CHIEF COMPLAINT:   Chief Complaint  Patient presents with  . Nausea  . Emesis    Cerebral palsy, brought with sepsis, stable now- transferred to floor. Pt answers properly, no distress.no fever, as per family- back to baseline.  REVIEW OF SYSTEMS:  CONSTITUTIONAL: No fever, fatigue or weakness.  EYES: No blurred or double vision.  EARS, NOSE, AND THROAT: No tinnitus or ear pain.  RESPIRATORY: No cough, shortness of breath, wheezing or hemoptysis.  CARDIOVASCULAR: No chest pain, orthopnea, edema.  GASTROINTESTINAL: No nausea, vomiting, diarrhea or abdominal pain.  GENITOURINARY: No dysuria, hematuria.  ENDOCRINE: No polyuria, nocturia,  HEMATOLOGY: No anemia, easy bruising or bleeding SKIN: No rash or lesion. MUSCULOSKELETAL: No joint pain or arthritis.   NEUROLOGIC: No tingling, numbness, weakness.  PSYCHIATRY: No anxiety or depression.   ROS  DRUG ALLERGIES:   Allergies  Allergen Reactions  . Penicillins     Sickness/sweating  . Sulfa Antibiotics Itching    VITALS:  Blood pressure 121/68, pulse (!) 57, temperature 98.2 F (36.8 C), temperature source Oral, resp. rate 18, height 5\' 10"  (1.778 m), weight 57.1 kg (125 lb 14.4 oz), SpO2 96 %.  PHYSICAL EXAMINATION:  GENERAL:  61 y.o.-year-old patient lying in the bed with no acute distress.  EYES: Pupils equal, round, reactive to light and accommodation. No scleral icterus. Extraocular muscles intact.  HEENT: Head atraumatic, normocephalic. Oropharynx and nasopharynx clear.  NECK:  Supple, no jugular venous distention. No thyroid enlargement, no tenderness.  LUNGS: Normal breath sounds bilaterally, no wheezing, rales,rhonchi or crepitation. No use of accessory muscles of respiration.  CARDIOVASCULAR: S1, S2 normal. No murmurs, rubs, or gallops.  ABDOMEN: Soft, nontender,  nondistended. Bowel sounds present. No organomegaly or mass. Foley in place. EXTREMITIES: No pedal edema, cyanosis, or clubbing. Chronic atrophic changes. NEUROLOGIC: Cranial nerves II through XII are intact. Muscle strength 0/5 in lower extremities, 2-3/5 both upper limbs. Sensation intact. Gait not checked.  PSYCHIATRIC: The patient is alert and oriented x 3.  SKIN: No obvious rash, lesion, or ulcer.   Physical Exam LABORATORY PANEL:   CBC Recent Labs  Lab 05/20/17 0336  WBC 4.7  HGB 8.4*  HCT 26.4*  PLT 132*   ------------------------------------------------------------------------------------------------------------------  Chemistries  Recent Labs  Lab 05/18/17 2048  05/20/17 0336  NA 143   < > 139  K 3.2*   < > 3.5  CL 113*   < > 110  CO2 19*   < > 24  GLUCOSE 200*   < > 103*  BUN 28*   < > 13  CREATININE 0.70   < > 0.44*  CALCIUM 9.1   < > 8.1*  MG 2.5*  --   --   AST 70*  --   --   ALT 41  --   --   ALKPHOS 102  --   --   BILITOT <0.1*  --   --    < > = values in this interval not displayed.   ------------------------------------------------------------------------------------------------------------------  Cardiac Enzymes Recent Labs  Lab 05/18/17 2048 05/19/17 0202  TROPONINI <0.03 <0.03   ------------------------------------------------------------------------------------------------------------------  RADIOLOGY:  Dg Chest Portable 1 View  Result Date: 05/18/2017 CLINICAL DATA:  Nausea vomiting and diarrhea EXAM: PORTABLE CHEST 1 VIEW COMPARISON:  02/23/2016 FINDINGS: Minimal scarring at the left base. No consolidation or effusion. Normal  heart size. Aortic atherosclerosis. No pneumothorax IMPRESSION: No active disease. Electronically Signed   By: Donavan Foil M.D.   On: 05/18/2017 21:17    ASSESSMENT AND PLAN:   Active Problems:   Sepsis ([place])  61 year old male with known history of cerebral palsy, neurogenic bladder and chronic lower  extremity DVTs presenting to the ED with a chief complaint of nausea, vomiting, diarrhea and sweats. Patient has abnormal urinalysis and found to be hypothermic & bradycardic. white count is elevated. Patient is started on broad-spectrum IV antibiotics and hospitalist team is called to admit the patient. Mom is at bedside.   #1 sepsis-secondary to urinary tract infection.  He was hypothermic and bradycardic which seemed to have resolved, Required Bear hugger- stable now. - broad-spectrum IV antibiotics -Blood and urine culture -Admitted to stepdown with sepsis protocol- stable now.   Pending results on Urine cx.  #2  Hypokalemia -Repleted  #3 cerebral palsy with chronic spasms-monitor, continue home meds    Bed bound- mother was taking care of him so far. Need social worker help for likely placement.   Spoke to mother and sister in room today, good family support, will take him home once d/c.  #4 depression-on Abilify  #5 neurogenic bladder-on bethanechol   Have foley now.  #6 DVT prophylaxis-resume Xarelto  #7 Anemia    Dilution likely. Checked guiac- negative and stable repeat CBC.   All the records are reviewed and case discussed with Care Management/Social Workerr. Management plans discussed with the patient, family and they are in agreement.  CODE STATUS: Full.  TOTAL TIME TAKING CARE OF THIS PATIENT: 35 minutes.    POSSIBLE D/C IN 1-2 DAYS, DEPENDING ON CLINICAL CONDITION.   Vaughan Basta M.D on 05/20/2017   Between 7am to 6pm - Pager - [phone]  After 6pm go to [email or web address] - password [email or web address] [facility]  Office  [phone]  CC: Primary care physician; Roselee Nova, MD  Note: This dictation was prepared with Dragon dictation along with smaller phrase technology. Any transcriptional errors that result from this process are unintentional.
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
## 2017-05-20 NOTE — Plan of Care (Signed)
Teaching needs to be completed when family is available. Patient is unable to provide birthdate for verification.

## 2017-05-20 NOTE — Care Management (Signed)
Spoke with patient's cap caseworker Alen about voiced concerns regarding patient's home environment. DSS is assessing home situation continuously and has been involved for quite a while. Nickson clarified that patient receives approximately 36 hours of cap services  Monday  through Friday and family provides care on the weekends.

## 2017-05-20 NOTE — Evaluation (Signed)
Physical Therapy Evaluation Patient Details Name: Gerald Montgomery MRN: [id number] DOB: 10-10-1955 Today's Date: 05/20/2017   History of Present Illness  Pt is a 61 year old male admitted for sepsis.  PMH includes cerebral palsy, depression, mood disorder, urinary incontinence and neurogenic bladder.  Clinical Impression  Pt is a 61 year old male with CP who lives with his mother, who is his primary caretaker.  Pt reportedly spends the majority of his days in his bed since he fell several years ago and broke his hip.  He does transfer to a WC with max assistance to eat his meals in the kitchen.  Pt presented with weakness of R LE but was able to perform a partial SLR and hemiplegia of L LE.  Pt required mod-max assist for all bed mobility and stated that he was uncomfortable sitting at EOB.  He was able to sit upright with max assist from PT and reported nausea upon sitting.  Pt required max assist, using his L UE to help with sit to supine.  Pt reported pain in LE with light to moderate touch during transfer and LE sensation intact.  Pt will continue to benefit from skilled PT with focus on strength, ROM, tolerance to activity and pain management.    Follow Up Recommendations Home health PT    Equipment Recommendations       Recommendations for Other Services OT consult     Precautions / Restrictions Precautions Precautions: Fall      Mobility  Bed Mobility Overal bed mobility: Needs Assistance Bed Mobility: Rolling;Sidelying to Sit;Sit to Supine Rolling: Mod assist Sidelying to sit: Max assist   Sit to supine: Max assist   General bed mobility comments: Pt is able to use L UE to assist throughout sidelying to sit but requires mod-max assist to remain upright at EOB.  Pt immediately reported nausea when sitting upright.  Transfers Overall transfer level: Needs assistance   Transfers: (Unable to perform due to nausea and pt reported not being comfortable performing transfer  today.)              Ambulation/Gait       Gait Pattern/deviations: (Unable to assess.  Pt is nonambulatory at baseline.)        Stairs            Wheelchair Mobility    Modified Rankin (Stroke Patients Only)       Balance Overall balance assessment: Needs assistance Sitting-balance support: Bilateral upper extremity supported   Sitting balance - Comments: Pt required max assist for upright posture during sitting at EOB. Postural control: Posterior lean                                   Pertinent Vitals/Pain Pain Assessment: Faces Faces Pain Scale: Hurts a little bit Pain Location: Pt reports feeling much better today but reports mild pain to the touch in LE during transfers. Pain Descriptors / Indicators: Dull Pain Intervention(s): Limited activity within patient's tolerance    Home Living Family/patient expects to be discharged to:: Private residence Living Arrangements: Parent Available Help at Discharge: Family(Pt's sister states that there are 3 more sisters and neices available who help with pt.) Type of Home: House Home Access: Level entry     Home Layout: One level Home Equipment: Wheelchair - manual      Prior Function Level of Independence: Needs assistance   Gait / Transfers  Assistance Needed: Max assistance for transfers and toileting.  Pt is able to use L UE to help.  ADL's / Homemaking Assistance Needed: Pt receives assistance for bathing and toileting.  States that mother uses WC to transfer him to kitchen for meals but pt is otherwise in bed all day.        Hand Dominance        Extremity/Trunk Assessment   Upper Extremity Assessment Upper Extremity Assessment: Generalized weakness(Pt presents with very limited shoulder abduction and flexion.  Grip strength is weak.)    Lower Extremity Assessment Lower Extremity Assessment: Generalized weakness(Pt is able to perform partial SLR with LR LE but unable with L.   Pt performs AAROM supine hip abduction with R LE.)    Cervical / Trunk Assessment Cervical / Trunk Assessment: Kyphotic  Communication   Communication: No difficulties  Cognition Arousal/Alertness: Awake/alert Behavior During Therapy: WFL for tasks assessed/performed Overall Cognitive Status: Within Functional Limits for tasks assessed                                        General Comments      Exercises     Assessment/Plan    PT Assessment Patient needs continued PT services  PT Problem List Decreased strength;Decreased range of motion;Decreased activity tolerance;Decreased balance;Decreased mobility;Decreased coordination;Pain       PT Treatment Interventions Functional mobility training;Therapeutic activities;Therapeutic exercise;Balance training;Patient/family education    PT Goals (Current goals can be found in the Care Plan section)       Frequency Min 2X/week   Barriers to discharge        Co-evaluation               AM-PAC PT "6 Clicks" Daily Activity  Outcome Measure Difficulty turning over in bed (including adjusting bedclothes, sheets and blankets)?: A Lot Difficulty moving from lying on back to sitting on the side of the bed? : A Lot Difficulty sitting down on and standing up from a chair with arms (e.g., wheelchair, bedside commode, etc,.)?: A Lot Help needed moving to and from a bed to chair (including a wheelchair)?: A Lot Help needed walking in hospital room?: Total Help needed climbing 3-5 steps with a railing? 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 disease Pain - Right/Left: Right Pain - part of body: Leg    Time: 0940-1000 PT Time Calculation (min) (ACUTE ONLY): 20 min   Charges:   PT Evaluation $PT Eval Moderate Complexity: 1 Mod PT Treatments $Therapeutic Activity: 8-22 mins   PT G Codes:   PT G-Codes **NOT FOR INPATIENT CLASS** Functional Assessment Tool Used: AM-PAC 6 Clicks Basic Mobility Functional Limitation: Changing and maintaining body position Changing and Maintaining Body Position Current Status (Y0737): At least 80 percent but less than 100 percent impaired, limited or restricted Changing and Maintaining Body Position Goal Status (T0626): At least 1 percent but less than 20 percent impaired, limited or restricted    Roxanne Gates, PT, DPT  Roxanne Gates 05/20/2017, 10:21 AM

## 2017-05-21 ENCOUNTER — Ambulatory Visit: Payer: Medicare Other

## 2017-05-21 LAB — GLUCOSE, CAPILLARY: Glucose-Capillary: 89 mg/dL (ref 65–99)

## 2017-05-21 MED ORDER — BISACODYL 5 MG PO TBEC: 5.0000 mg | DELAYED_RELEASE_TABLET | Freq: Every day | ORAL | 0 refills | Status: DC | PRN

## 2017-05-21 MED ORDER — CEFUROXIME AXETIL 250 MG PO TABS: 250.0000 mg | ORAL_TABLET | Freq: Two times a day (BID) | ORAL | 0 refills | Status: AC

## 2017-05-21 NOTE — Progress Notes (Signed)
Pt being discharged to home today via EMS. Pt prepared for D/C, Heart monitor removed, and IV's removed. Awaiting arrival of EMS at this time. Called family to give update.

## 2017-05-21 NOTE — Care Management Important Message (Signed)
Important Message  Patient Details  Name: ELMER MERWIN MRN: [id number] Date of Birth: 1955/06/16   Medicare Important Message Given:  Yes Explained notice to Ms Lavone Neri- patient's niece. signed notice is in the medical record  Katrina Stack, RN 05/21/2017, 9:52 AM

## 2017-05-21 NOTE — Discharge Summary (Signed)
[facility] NAME: Kabir Brannock    MR#:  [id number]  DATE OF BIRTH:  [birth year]  DATE OF ADMISSION:  05/18/2017 ADMITTING PHYSICIAN: Max Sane, MD  DATE OF DISCHARGE: 05/21/2017   PRIMARY CARE PHYSICIAN: Roselee Nova, MD    ADMISSION DIAGNOSIS:  Bradycardia with less than 30 beats per minute [R00.1] Sepsis, due to unspecified organism ([place]) [A41.9]  DISCHARGE DIAGNOSIS:  Active Problems:   Sepsis ([place])    UTI SECONDARY DIAGNOSIS:   Past Medical History:  Diagnosis Date  . Cerebral palsy ([place])   . Depression   . History of DVT of lower extremity   . Mood disorder ([place])   . Neurogenic bladder   . Urinary incontinence     HOSPITAL COURSE:   61 year old male withknown history of cerebral palsy, neurogenic bladder and chronic lower extremity DVTs presenting to the ED with a chief complaint ofnausea, vomiting, diarrhea and sweats.Patient has abnormal urinalysis and found to be hypothermic&bradycardic.white count is elevated. Patient is started on broad-spectrum IV antibiotics and hospitalist team is called to admit the patient. Mom is at bedside.   #1 sepsis-secondary to urinary tract infection. He was hypothermic and bradycardic which seemed to have resolved, RequiredBearhugger- stable now. - broad-spectrum IV antibiotics -Blood and urine culture -Admitted to stepdown with sepsis protocol- stable now.   Pending results on Urine cx. Growing E coli, in recent past his ur cx had all sensitive bacterias. Still after 3 days- cx result not final, so d/c on oral cefuroxime. ( Was on Zosyn IV in hospital) Advise to follow with PMD in 1 week to follow up on cx results. He also have ch urine retention issues, and have indwelling foley, follows at Urology clinic, so advise to cont. Catheter was replaced a week ago as per sister, at urology clinic.  #2Hypokalemia -Repleted  #3 cerebral palsy with chronic  spasms-monitor, continue home meds    Bed bound- mother was taking care of him so far. Need social worker help for likely placement.   Spoke to mother and sister in room today, good family support, will take him home once d/c.  #4 depression-on Abilify  #5 neurogenic bladder-on bethanechol   Have foley now.  #6 DVT prophylaxis-resume Xarelto  #7 Anemia    Dilution likely. Checked guiac- negative and stable repeat CBC.    DISCHARGE CONDITIONS:   Stable.  CONSULTS OBTAINED:    DRUG ALLERGIES:   Allergies  Allergen Reactions  . Penicillins     Sickness/sweating  . Sulfa Antibiotics Itching    DISCHARGE MEDICATIONS:   Allergies as of 05/21/2017      Reactions   Penicillins    Sickness/sweating   Sulfa Antibiotics Itching      Medication List    TAKE these medications   acetaminophen 325 MG tablet Commonly known as:  TYLENOL Take 1 tablet (325 mg total) by mouth every 6 (six) hours as needed for mild pain (or Fever >/= 101).   ARIPiprazole 15 MG tablet Commonly known as:  ABILIFY TAKE 1 TABLET(15 MG) BY MOUTH DAILY   bethanechol 50 MG tablet Commonly known as:  URECHOLINE TAKE 1 TABLET(50 MG) BY MOUTH THREE TIMES DAILY   bisacodyl 5 MG EC tablet Commonly known as:  DULCOLAX Take 1 tablet (5 mg total) by mouth daily as needed for moderate constipation.   cefUROXime 250 MG tablet Commonly known as:  CEFTIN Take 1 tablet (250 mg total) by mouth  2 (two) times daily for 5 days.   pantoprazole 40 MG tablet Commonly known as:  PROTONIX Take 1 tablet (40 mg total) by mouth 2 (two) times daily.   XARELTO 10 MG Tabs tablet Generic drug:  rivaroxaban TAKE 1 TABLET(10 MG) BY MOUTH DAILY        DISCHARGE INSTRUCTIONS:    Follow with PMD and Urology clinic in 1-2 weeks.  If you experience worsening of your admission symptoms, develop shortness of breath, life threatening emergency, suicidal or homicidal thoughts you must seek medical attention  immediately by calling 911 or calling your MD immediately  if symptoms less severe.  You Must read complete instructions/literature along with all the possible adverse reactions/side effects for all the Medicines you take and that have been prescribed to you. Take any new Medicines after you have completely understood and accept all the possible adverse reactions/side effects.   Please note  You were cared for by a hospitalist during your hospital stay. If you have any questions about your discharge medications or the care you received while you were in the hospital after you are discharged, you can call the unit and asked to speak with the hospitalist on call if the hospitalist that took care of you is not available. Once you are discharged, your primary care physician will handle any further medical issues. Please note that NO REFILLS for any discharge medications will be authorized once you are discharged, as it is imperative that you return to your primary care physician (or establish a relationship with a primary care physician if you do not have one) for your aftercare needs so that they can reassess your need for medications and monitor your lab values.    Today   CHIEF COMPLAINT:   Chief Complaint  Patient presents with  . Nausea  . Emesis    HISTORY OF PRESENT ILLNESS:  Evaan Tidwell  is a 61 y.o. male with a known history of cerebral palsy, neurogenic bladder and chronic lower extremity DVTs on xarelto brought to the emergency department via EMS with complaints of nausea, vomiting, diarrhea and sweats. EMS report pt is from home but living conditions were questionable. Patient's mother recently having a stroke and being unable to provide the necessary care, she is at bedside. Pt soaked with sweat while in the emergency department.  While in the ED he was also hypothermic with a temperature of 97.4 and his heart rate was noted to be 27.  Given 1 mg of IV morphine with good response.   Heart rate now in the 90s.  Bear hugger was also applied for hypothermia and temperature is improving now.   VITAL SIGNS:  Blood pressure 123/66, pulse 61, temperature 97.8 F (36.6 C), temperature source Oral, resp. rate 17, height 5\' 10"  (1.778 m), weight 57.1 kg (125 lb 14.4 oz), SpO2 96 %.  I/O:    Intake/Output Summary (Last 24 hours) at 05/21/2017 0917 Last data filed at 05/21/2017 0700 Gross per 24 hour  Intake 100 ml  Output 3900 ml  Net -3800 ml    PHYSICAL EXAMINATION:   GENERAL:  61 y.o.-year-old patient lying in the bed with no acute distress.  EYES: Pupils equal, round, reactive to light and accommodation. No scleral icterus. Extraocular muscles intact.  HEENT: Head atraumatic, normocephalic. Oropharynx and nasopharynx clear.  NECK:  Supple, no jugular venous distention. No thyroid enlargement, no tenderness.  LUNGS: Normal breath sounds bilaterally, no wheezing, rales,rhonchi or crepitation. No use of accessory muscles of  respiration.  CARDIOVASCULAR: S1, S2 normal. No murmurs, rubs, or gallops.  ABDOMEN: Soft, nontender, nondistended. Bowel sounds present. No organomegaly or mass. Foley in place. EXTREMITIES: No pedal edema, cyanosis, or clubbing. Chronic atrophic changes. NEUROLOGIC: Cranial nerves II through XII are intact. Muscle strength 0/5 in lower extremities, 2-3/5 both upper limbs. Sensation intact. Gait not checked.  PSYCHIATRIC: The patient is alert and oriented x 3.  SKIN: No obvious rash, lesion, or ulcer.     DATA REVIEW:   CBC Recent Labs  Lab 05/20/17 0336  WBC 4.7  HGB 8.4*  HCT 26.4*  PLT 132*    Chemistries  Recent Labs  Lab 05/18/17 2048  05/20/17 0336  NA 143   < > 139  K 3.2*   < > 3.5  CL 113*   < > 110  CO2 19*   < > 24  GLUCOSE 200*   < > 103*  BUN 28*   < > 13  CREATININE 0.70   < > 0.44*  CALCIUM 9.1   < > 8.1*  MG 2.5*  --   --   AST 70*  --   --   ALT 41  --   --   ALKPHOS 102  --   --   BILITOT <0.1*  --   --     < > = values in this interval not displayed.    Cardiac Enzymes Recent Labs  Lab 05/19/17 0202  TROPONINI <0.03    Microbiology Results  Results for orders placed or performed during the hospital encounter of 05/18/17  Urine culture     Status: Abnormal (Preliminary result)   Collection Time: 05/18/17  8:48 PM  Result Value Ref Range Status   Specimen Description   Final    URINE, RANDOM Performed at [facility], [address], [place], [place] [address]    Special Requests   Final    NONE Performed at [facility], [address] [place], [place] [address]    Culture (A)  Final    >=100,000 COLONIES/mL ESCHERICHIA COLI REPEATING SUSCEPTIBILITIES Performed at [facility], [place] [address] [place], [place] [address]    Report Status PENDING  Incomplete  Blood culture (routine x 2)     Status: None (Preliminary result)   Collection Time: 05/18/17  8:49 PM  Result Value Ref Range Status   Specimen Description BLOOD LEFT FORE ARM  Final   Special Requests   Final    BOTTLES DRAWN AEROBIC AND ANAEROBIC Blood Culture adequate volume   Culture   Final    NO GROWTH 3 DAYS Performed at [facility], [address] [place], [place] [address]    Report Status PENDING  Incomplete  Blood culture (routine x 2)     Status: None (Preliminary result)   Collection Time: 05/18/17  8:49 PM  Result Value Ref Range Status   Specimen Description BLOOD RIGHT [facility]  Final   Special Requests   Final    BOTTLES DRAWN AEROBIC AND ANAEROBIC Blood Culture adequate volume   Culture   Final    NO GROWTH 3 DAYS Performed at [facility], [address] [place], [place] [address]    Report Status PENDING  Incomplete  Gastrointestinal Panel by PCR , Stool     Status: None   Collection Time: 05/18/17  8:49 PM  Result Value Ref Range Status   Campylobacter species NOT DETECTED NOT DETECTED Final   Plesimonas shigelloides  NOT DETECTED NOT DETECTED  Final   Salmonella species NOT DETECTED NOT DETECTED Final   Yersinia enterocolitica NOT DETECTED NOT DETECTED Final   Vibrio species NOT DETECTED NOT DETECTED Final   Vibrio cholerae NOT DETECTED NOT DETECTED Final   Enteroaggregative E coli (EAEC) NOT DETECTED NOT DETECTED Final   Enteropathogenic E coli (EPEC) NOT DETECTED NOT DETECTED Final   Enterotoxigenic E coli (ETEC) NOT DETECTED NOT DETECTED Final   Shiga like toxin producing E coli (STEC) NOT DETECTED NOT DETECTED Final   Shigella/Enteroinvasive E coli (EIEC) NOT DETECTED NOT DETECTED Final   Cryptosporidium NOT DETECTED NOT DETECTED Final   Cyclospora cayetanensis NOT DETECTED NOT DETECTED Final   Entamoeba histolytica NOT DETECTED NOT DETECTED Final   Giardia lamblia NOT DETECTED NOT DETECTED Final   Adenovirus F40/41 NOT DETECTED NOT DETECTED Final   Astrovirus NOT DETECTED NOT DETECTED Final   Norovirus GI/GII NOT DETECTED NOT DETECTED Final   Rotavirus A NOT DETECTED NOT DETECTED Final   Sapovirus (I, II, IV, and V) NOT DETECTED NOT DETECTED Final    Comment: Performed at [facility], [address] [place], [place] [address]  MRSA PCR Screening     Status: None   Collection Time: 05/19/17  2:34 AM  Result Value Ref Range Status   MRSA by PCR NEGATIVE NEGATIVE Final    Comment:        The GeneXpert MRSA Assay (FDA approved for NASAL specimens only), is one component of a comprehensive MRSA colonization surveillance program. It is not intended to diagnose MRSA infection nor to guide or monitor treatment for MRSA infections. Performed at [facility], [address], [place],  [address]     RADIOLOGY:  No results found.  EKG:   Orders placed or performed during the hospital encounter of 05/18/17  . EKG 12-Lead  . EKG 12-Lead    Management plans discussed with the patient, family and they are in agreement.  CODE STATUS:     Code Status Orders  (From admission, onward)         Start     Ordered   05/19/17 0150  Full code  Continuous     05/19/17 0149    Code Status History    Date Active Date Inactive Code Status Order ID Comments User Context   02/05/2017 14:06 02/08/2017 16:23 Full Code [id number]  Nicholes Mango, MD Inpatient   02/05/2016 18:29 02/07/2016 16:04 Full Code [id number]  Hillary Bow, MD ED      TOTAL TIME TAKING CARE OF THIS PATIENT: 35 minutes.    Vaughan Basta M.D on 05/21/2017 at 9:17 AM  Between 7am to 6pm - Pager - [phone]  After 6pm go to [email or web address] - password [email or web address] [facility]  Office  [phone]  CC: Primary care physician; Roselee Nova, MD   Note: This dictation was prepared with Dragon dictation along with smaller phrase technology. Any transcriptional errors that result from this process are unintentional.

## 2017-05-21 NOTE — Care Management (Signed)
patient for discharge home today and to travel by ems.  Updated Rogelia Boga at [facility]. Agency preference for home health is [facility].  Order for SN PT OT

## 2017-05-22 LAB — URINE CULTURE

## 2017-05-23 LAB — CULTURE, BLOOD (ROUTINE X 2)
CULTURE: NO GROWTH
Culture: NO GROWTH
SPECIAL REQUESTS: ADEQUATE
Special Requests: ADEQUATE

## 2017-05-24 ENCOUNTER — Other Ambulatory Visit: Payer: Self-pay | Admitting: Internal Medicine

## 2017-05-26 ENCOUNTER — Telehealth: Payer: Self-pay | Admitting: Family Medicine

## 2017-05-26 NOTE — Telephone Encounter (Signed)
I need the appropriate diagnoses for the hospital bed prescription, it is best that he come in for an appointment for hospital follow-up and we'll address any concerns at that time.

## 2017-05-26 NOTE — Telephone Encounter (Signed)
Copied from [facility] [id number]. Topic: General - Other >> May 26, 2017  1:32 PM Lolita Rieger, [place] wrote: Reason for CRM:Pt called and stated that he needs a rx for a new hospital bed with a gel overlay faxed to [phone]

## 2017-05-28 NOTE — Telephone Encounter (Signed)
Pt sister that they will speak to Dr. Estelle June

## 2017-05-31 ENCOUNTER — Ambulatory Visit (INDEPENDENT_AMBULATORY_CARE_PROVIDER_SITE_OTHER): Payer: Medicare Other | Admitting: Family Medicine

## 2017-05-31 ENCOUNTER — Encounter: Payer: Self-pay | Admitting: Family Medicine

## 2017-05-31 VITALS — BP 128/68 | HR 85 | Temp 97.7°F | Resp 14 | Wt 126.0 lb

## 2017-05-31 DIAGNOSIS — B3789 Other sites of candidiasis: Secondary | ICD-10-CM

## 2017-05-31 DIAGNOSIS — N390 Urinary tract infection, site not specified: Secondary | ICD-10-CM | POA: Diagnosis not present

## 2017-05-31 DIAGNOSIS — S31809A Unspecified open wound of unspecified buttock, initial encounter: Secondary | ICD-10-CM

## 2017-05-31 DIAGNOSIS — A419 Sepsis, unspecified organism: Secondary | ICD-10-CM | POA: Diagnosis not present

## 2017-05-31 DIAGNOSIS — S90211A Contusion of right great toe with damage to nail, initial encounter: Secondary | ICD-10-CM | POA: Diagnosis not present

## 2017-05-31 MED ORDER — NYSTATIN 100000 UNIT/GM EX POWD: Freq: Three times a day (TID) | CUTANEOUS | 0 refills | Status: DC

## 2017-05-31 NOTE — Progress Notes (Signed)
Name: Gerald Montgomery   MRN: [id number]    DOB: December 23, 1955   Date:05/31/2017       Progress Note  Subjective  Chief Complaint  Chief Complaint  Patient presents with  . Hospitalization Follow-up    infection in his blood stream; UTI.   Marland Kitchen Rx for a bed  . Referral    nurse at [facility] for wound on his bottom area   . testicle bumps    happen couple of days ago. Pt sister said it never happen to him before  . toe nail injury    Pt states the nurse hit his right toe and jammed it and his nail looked like its falling off    HPI  Pt. Presents for follow up from the hospital. He was admitted for sepsis from a urinary source, he had E.coli and was started on IV antibiotics, discharged on Cefuroxime, his symptoms have improved, sepsis has resolved.   In addition, his sister is concerned about a 'raw testicle' with possible vlisters on it, he was already dressed for doctor's appointment this AM and she was not able to look at the testicle. Pt. Denies any testiclular or scrotal dischrage or rash, no fevers or chills although he is considered a poor historian.   Pt.'s sister is also concerned about a wound on his right and left buttock, not sure of the timeline but it may have been noticed yesterday. Pt. Was recently in the hospital for sepsis and the wound was not mentioned in the discharge summary.  Pt.' s family is also worried about right great toe nail, it was apparently jammed into the shower door/wall by his caregiver, incident happedned approximately 3 months ago, initially it was fine but now it has started to swell and bleed and some pus in the past.   Past Medical History:  Diagnosis Date  . Cerebral palsy ([place])   . Depression   . History of DVT of lower extremity   . Mood disorder ([place])   . Neurogenic bladder   . Urinary incontinence     Past Surgical History:  Procedure Laterality Date  . ESOPHAGOGASTRODUODENOSCOPY Left 02/07/2017   Procedure:  ESOPHAGOGASTRODUODENOSCOPY (EGD);  Surgeon: Thornton Park, MD;  Location: [place];  Service: Gastroenterology;  Laterality: Left;  . filter surgery for blood clot  2013  . HIP FRACTURE SURGERY  2013    Family History  Problem Relation Age of Onset  . Heart disease Mother   . Atrial fibrillation Mother   . Diabetes Mother        Borderline  . Heart disease Father        Passed away of heart attack  . Kidney disease Neg Hx   . Prostate cancer Neg Hx     Social History   Socioeconomic History  . Marital status: Single    Spouse name: Not on file  . Number of children: Not on file  . Years of education: Not on file  . Highest education level: Not on file  Social Needs  . Financial resource strain: Not on file  . Food insecurity - worry: Not on file  . Food insecurity - inability: Not on file  . Transportation needs - medical: Not on file  . Transportation needs - non-medical: Not on file  Occupational History  . Not on file  Tobacco Use  . Smoking status: Never Smoker  . Smokeless tobacco: Never Used  Substance and Sexual Activity  . Alcohol use: No  Alcohol/week: 0.0 oz  . Drug use: No  . Sexual activity: No  Other Topics Concern  . Not on file  Social History Narrative  . Not on file     Current Outpatient Medications:  .  acetaminophen (TYLENOL) 325 MG tablet, Take 1 tablet (325 mg total) by mouth every 6 (six) hours as needed for mild pain (or Fever >/= 101)., Disp: , Rfl:  .  ARIPiprazole (ABILIFY) 15 MG tablet, TAKE 1 TABLET(15 MG) BY MOUTH DAILY, Disp: 90 tablet, Rfl: 1 .  bethanechol (URECHOLINE) 50 MG tablet, TAKE 1 TABLET(50 MG) BY MOUTH THREE TIMES DAILY, Disp: 270 tablet, Rfl: 1 .  bisacodyl (DULCOLAX) 5 MG EC tablet, Take 1 tablet (5 mg total) by mouth daily as needed for moderate constipation., Disp: 30 tablet, Rfl: 0 .  pantoprazole (PROTONIX) 40 MG tablet, Take 1 tablet (40 mg total) by mouth 2 (two) times daily., Disp: 60 tablet, Rfl:  0 .  XARELTO 10 MG TABS tablet, TAKE 1 TABLET(10 MG) BY MOUTH DAILY, Disp: 90 tablet, Rfl: 0  Allergies  Allergen Reactions  . Sulfa Antibiotics Itching     ROS    Objective  Vitals:   05/31/17 1127  BP: 128/68  Pulse: 85  Resp: 14  Temp: 97.7 F (36.5 C)  TempSrc: Oral  SpO2: 99%  Weight: 126 lb (57.2 kg)    Physical Exam  Constitutional: He is well-developed, well-nourished, and in no distress.  Genitourinary: He exhibits no abnormal testicular mass and no abnormal scrotal mass.  Genitourinary Comments: Scrotal skin appears normal, whitish flaky crust-like material on the scrotal sac and the adjoining groin skin, likely representing possible candidal infection  Skin:  Right great toe nail bed has purulent dried discharge, nail partially separated from the nail bed,   Nursing note and vitals reviewed.    Recent Results (from the past 2160 hour(s))  Urine Culture     Status: Abnormal   Collection Time: 03/26/17  2:38 PM  Result Value Ref Range   MICRO NUMBER: [id number]    SPECIMEN QUALITY: ADEQUATE    Sample Source URINE    STATUS: FINAL    ISOLATE 1: Pseudomonas aeruginosa (A)       Susceptibility   Pseudomonas aeruginosa - URINE CULTURE, REFLEX    CEFTAZIDIME 2 Sensitive     CEFEPIME 4 Sensitive     CIPROFLOXACIN <=0.25 Sensitive     LEVOFLOXACIN 1 Sensitive     GENTAMICIN <=1 Sensitive     IMIPENEM 1 Sensitive     PIP/TAZO <=4 Sensitive     TOBRAMYCIN* <=1 Sensitive      * Legend:S = Susceptible  I = IntermediateR = Resistant  NS = Not susceptible* = Not tested  NR = Not reported**NN = See antimicrobic comments  Urinalysis, Routine w reflex microscopic     Status: Abnormal   Collection Time: 03/26/17  2:42 PM  Result Value Ref Range   Color, Urine YELLOW YELLOW   APPearance CLEAR CLEAR   Specific Gravity, Urine 1.015 1.001 - 1.03   pH 7.0 5.0 - 8.0   Glucose, UA NEGATIVE NEGATIVE   Bilirubin Urine NEGATIVE NEGATIVE   Ketones, ur NEGATIVE NEGATIVE    Hgb urine dipstick NEGATIVE NEGATIVE   Protein, ur TRACE (A) NEGATIVE   Nitrite NEGATIVE NEGATIVE   Leukocytes, UA 3+ (A) NEGATIVE   WBC, UA > OR = 60 (A) 0 - 5 /HPF   RBC / HPF NONE SEEN 0 - 2 /HPF  Squamous Epithelial / LPF > OR = 28 (A) < OR = 5 /HPF   Bacteria, UA MANY (A) NONE SEEN /HPF   Hyaline Cast 0-5 (A) NONE SEEN /LPF   Yeast FEW (A) NONE SEEN /HPF  Urinalysis, Complete     Status: Abnormal   Collection Time: 04/02/17  9:55 AM  Result Value Ref Range   Specific Gravity, UA 1.010 1.005 - 1.030   pH, UA 6.0 5.0 - 7.5   Color, UA Yellow Yellow   Appearance Ur Cloudy (A) Clear   Leukocytes, UA 2+ (A) Negative   Protein, UA Negative Negative/Trace   Glucose, UA Negative Negative   Ketones, UA Negative Negative   RBC, UA Negative Negative   Bilirubin, UA Negative Negative   Urobilinogen, Ur 0.2 0.2 - 1.0 mg/dL   Nitrite, UA Negative Negative   Microscopic Examination See below:   Microscopic Examination     Status: Abnormal   Collection Time: 04/02/17  9:55 AM  Result Value Ref Range   WBC, UA 0-5 0 - 5 /hpf   RBC, UA None seen 0 - 2 /hpf   Epithelial Cells (non renal) 0-10 0 - 10 /hpf   Bacteria, UA Few (A) None seen/Few  Bladder Scan (Post Void Residual) in office     Status: None   Collection Time: 04/02/17 10:00 AM  Result Value Ref Range   Scan Result 589   Urinalysis, Complete     Status: Abnormal   Collection Time: 04/14/17  3:05 PM  Result Value Ref Range   Specific Gravity, UA 1.025 1.005 - 1.030   pH, UA 5.5 5.0 - 7.5   Color, UA Yellow Yellow   Appearance Ur Cloudy (A) Clear   Leukocytes, UA 1+ (A) Negative   Protein, UA 2+ (A) Negative/Trace   Glucose, UA Negative Negative   Ketones, UA Negative Negative   RBC, UA Trace (A) Negative   Bilirubin, UA Negative Negative   Urobilinogen, Ur 1.0 0.2 - 1.0 mg/dL   Nitrite, UA Positive (A) Negative   Microscopic Examination See below:   Microscopic Examination     Status: Abnormal   Collection Time:  04/14/17  3:05 PM  Result Value Ref Range   WBC, UA 11-30 (A) 0 - 5 /hpf   RBC, UA None seen 0 - 2 /hpf   Epithelial Cells (non renal) None seen 0 - 10 /hpf   Casts Present (A) None seen /lpf   Cast Type Hyaline casts N/A   Mucus, UA Present (A) Not Estab.   Bacteria, UA Many (A) None seen/Few   Yeast, UA Present (A) None seen  CULTURE, URINE COMPREHENSIVE     Status: None   Collection Time: 04/14/17  3:39 PM  Result Value Ref Range   Urine Culture, Comprehensive Final report    Organism ID, Bacteria Comment     Comment: Mixed urogenital flora Greater than 100,000 colony forming units per mL   APTT     Status: None   Collection Time: 05/12/17 12:46 PM  Result Value Ref Range   aPTT 29 24 - 36 seconds  Basic metabolic panel     Status: Abnormal   Collection Time: 05/12/17 12:46 PM  Result Value Ref Range   Sodium 138 135 - 145 mmol/L   Potassium 3.8 3.5 - 5.1 mmol/L   Chloride 105 101 - 111 mmol/L   CO2 23 22 - 32 mmol/L   Glucose, Bld 108 (H) 65 - 99 mg/dL   BUN  23 (H) 6 - 20 mg/dL   Creatinine, Ser 0.48 (L) 0.61 - 1.24 mg/dL   Calcium 8.8 (L) 8.9 - 10.3 mg/dL   GFR calc non Af Amer >60 >60 mL/min   GFR calc Af Amer >60 >60 mL/min    Comment: (NOTE) The eGFR has been calculated using the CKD EPI equation. This calculation has not been validated in all clinical situations. eGFR's persistently <60 mL/min signify possible Chronic Kidney Disease.    Anion gap 10 5 - 15  CBC     Status: Abnormal   Collection Time: 05/12/17 12:46 PM  Result Value Ref Range   WBC 5.0 3.8 - 10.6 K/uL   RBC 4.55 4.40 - 5.90 MIL/uL   Hemoglobin 10.7 (L) 13.0 - 18.0 g/dL   HCT 33.9 (L) 40.0 - 52.0 %   MCV 74.4 (L) 80.0 - 100.0 fL   MCH 23.6 (L) 26.0 - 34.0 pg   MCHC 31.7 (L) 32.0 - 36.0 g/dL   RDW 16.3 (H) 11.5 - 14.5 %   Platelets 185 150 - 440 K/uL  Protime-INR     Status: None   Collection Time: 05/12/17 12:46 PM  Result Value Ref Range   Prothrombin Time 13.1 11.4 - 15.2 seconds    INR 1.00   CBC     Status: Abnormal   Collection Time: 05/18/17  8:48 PM  Result Value Ref Range   WBC 23.9 (H) 3.8 - 10.6 K/uL   RBC 5.05 4.40 - 5.90 MIL/uL   Hemoglobin 11.7 (L) 13.0 - 18.0 g/dL   HCT 37.6 (L) 40.0 - 52.0 %   MCV 74.6 (L) 80.0 - 100.0 fL   MCH 23.2 (L) 26.0 - 34.0 pg   MCHC 31.1 (L) 32.0 - 36.0 g/dL   RDW 16.6 (H) 11.5 - 14.5 %   Platelets 253 150 - 440 K/uL    Comment: Performed at [facility], [address] [place], [place] [address]  Comprehensive metabolic panel     Status: Abnormal   Collection Time: 05/18/17  8:48 PM  Result Value Ref Range   Sodium 143 135 - 145 mmol/L   Potassium 3.2 (L) 3.5 - 5.1 mmol/L   Chloride 113 (H) 101 - 111 mmol/L   CO2 19 (L) 22 - 32 mmol/L   Glucose, Bld 200 (H) 65 - 99 mg/dL   BUN 28 (H) 6 - 20 mg/dL   Creatinine, Ser 0.70 0.61 - 1.24 mg/dL   Calcium 9.1 8.9 - 10.3 mg/dL   Total Protein 7.5 6.5 - 8.1 g/dL   Albumin 4.2 3.5 - 5.0 g/dL   AST 70 (H) 15 - 41 U/L   ALT 41 17 - 63 U/L   Alkaline Phosphatase 102 38 - 126 U/L   Total Bilirubin <0.1 (L) 0.3 - 1.2 mg/dL   GFR calc non Af Amer >60 >60 mL/min   GFR calc Af Amer >60 >60 mL/min    Comment: (NOTE) The eGFR has been calculated using the CKD EPI equation. This calculation has not been validated in all clinical situations. eGFR's persistently <60 mL/min signify possible Chronic Kidney Disease.    Anion gap 11 5 - 15    Comment: Performed at [facility], [address] [place], [place] [address]  Troponin I     Status: None   Collection Time: 05/18/17  8:48 PM  Result Value Ref Range   Troponin I <0.03 <0.03 ng/mL    Comment: Performed at [facility], [address] [place], [place] [address]  Urinalysis, Complete w Microscopic     Status: Abnormal   Collection Time: 05/18/17  8:48 PM  Result Value Ref Range   Color, Urine AMBER (A) YELLOW    Comment: BIOCHEMICALS MAY BE AFFECTED BY COLOR   APPearance HAZY (A) CLEAR    Specific Gravity, Urine 1.023 1.005 - 1.030   pH 5.0 5.0 - 8.0   Glucose, UA 50 (A) NEGATIVE mg/dL   Hgb urine dipstick MODERATE (A) NEGATIVE   Bilirubin Urine NEGATIVE NEGATIVE   Ketones, ur 5 (A) NEGATIVE mg/dL   Protein, ur 30 (A) NEGATIVE mg/dL   Nitrite POSITIVE (A) NEGATIVE   Leukocytes, UA SMALL (A) NEGATIVE   RBC / HPF TOO NUMEROUS TO COUNT 0 - 5 RBC/hpf   WBC, UA TOO NUMEROUS TO COUNT 0 - 5 WBC/hpf   Bacteria, UA MANY (A) NONE SEEN   Squamous Epithelial / LPF NONE SEEN NONE SEEN   WBC Clumps PRESENT    Mucus PRESENT    Hyaline Casts, UA PRESENT    Amorphous Crystal PRESENT     Comment: Performed at [facility], [address] [place], [place] [address]  Lactic acid, plasma     Status: Abnormal   Collection Time: 05/18/17  8:48 PM  Result Value Ref Range   Lactic Acid, Venous 3.5 (HH) 0.5 - 1.9 mmol/L    Comment: CRITICAL RESULT CALLED TO, READ BACK BY AND VERIFIED WITH LAURIE LEMONS AT 2136 05/18/17 ALV Performed at [facility], [address], [place], [place] [address]   Protime-INR     Status: Abnormal   Collection Time: 05/18/17  8:48 PM  Result Value Ref Range   Prothrombin Time 20.3 (H) 11.4 - 15.2 seconds   INR 1.75     Comment: Performed at [facility], [address] [place], [place] [address]  APTT     Status: None   Collection Time: 05/18/17  8:48 PM  Result Value Ref Range   aPTT 32 24 - 36 seconds    Comment: Performed at [facility], [address], [place], [place] [address]  Magnesium     Status: Abnormal   Collection Time: 05/18/17  8:48 PM  Result Value Ref Range   Magnesium 2.5 (H) 1.7 - 2.4 mg/dL    Comment: Performed at [facility], [address] [place], [place] [address]  CK     Status: None   Collection Time: 05/18/17  8:48 PM  Result Value Ref Range   Total CK 71 49 - 397 U/L    Comment: Performed at [facility], [address], [place], [place] [address]  Urine  culture     Status: Abnormal   Collection Time: 05/18/17  8:48 PM  Result Value Ref Range   Specimen Description      URINE, RANDOM Performed at [facility], [address], [place], [place] [address]    Special Requests      NONE Performed at [facility], [address] [place],  [address]    Culture >=100,000 COLONIES/mL ESCHERICHIA COLI (A)    Report Status 05/22/2017 FINAL    Organism ID, Bacteria ESCHERICHIA COLI (A)       Susceptibility   Escherichia coli - MIC*    AMPICILLIN 8 SENSITIVE Sensitive     CEFAZOLIN <=4 SENSITIVE Sensitive     CEFTRIAXONE <=1 SENSITIVE Sensitive     CIPROFLOXACIN >=4 RESISTANT Resistant  GENTAMICIN <=1 SENSITIVE Sensitive     IMIPENEM <=0.25 SENSITIVE Sensitive     NITROFURANTOIN 32 SENSITIVE Sensitive     TRIMETH/SULFA <=20 SENSITIVE Sensitive     AMPICILLIN/SULBACTAM <=2 SENSITIVE Sensitive     PIP/TAZO <=4 SENSITIVE Sensitive     Extended ESBL NEGATIVE Sensitive     * >=100,000 COLONIES/mL ESCHERICHIA COLI  Blood culture (routine x 2)     Status: None   Collection Time: 05/18/17  8:49 PM  Result Value Ref Range   Specimen Description BLOOD LEFT FORE ARM    Special Requests      BOTTLES DRAWN AEROBIC AND ANAEROBIC Blood Culture adequate volume   Culture      NO GROWTH 5 DAYS Performed at [facility], [address] [place], [place] [address]    Report Status 05/23/2017 FINAL   Blood culture (routine x 2)     Status: None   Collection Time: 05/18/17  8:49 PM  Result Value Ref Range   Specimen Description BLOOD RIGHT AC    Special Requests      BOTTLES DRAWN AEROBIC AND ANAEROBIC Blood Culture adequate volume   Culture      NO GROWTH 5 DAYS Performed at [facility], [address] [place], [place] [address]    Report Status 05/23/2017 FINAL   Gastrointestinal Panel by PCR , Stool     Status: None   Collection Time: 05/18/17  8:49 PM  Result Value Ref Range   Campylobacter  species NOT DETECTED NOT DETECTED   Plesimonas shigelloides NOT DETECTED NOT DETECTED   Salmonella species NOT DETECTED NOT DETECTED   Yersinia enterocolitica NOT DETECTED NOT DETECTED   Vibrio species NOT DETECTED NOT DETECTED   Vibrio cholerae NOT DETECTED NOT DETECTED   Enteroaggregative E coli (EAEC) NOT DETECTED NOT DETECTED   Enteropathogenic E coli (EPEC) NOT DETECTED NOT DETECTED   Enterotoxigenic E coli (ETEC) NOT DETECTED NOT DETECTED   Shiga like toxin producing E coli (STEC) NOT DETECTED NOT DETECTED   Shigella/Enteroinvasive E coli (EIEC) NOT DETECTED NOT DETECTED   Cryptosporidium NOT DETECTED NOT DETECTED   Cyclospora cayetanensis NOT DETECTED NOT DETECTED   Entamoeba histolytica NOT DETECTED NOT DETECTED   Giardia lamblia NOT DETECTED NOT DETECTED   Adenovirus F40/41 NOT DETECTED NOT DETECTED   Astrovirus NOT DETECTED NOT DETECTED   Norovirus GI/GII NOT DETECTED NOT DETECTED   Rotavirus A NOT DETECTED NOT DETECTED   Sapovirus (I, II, IV, and V) NOT DETECTED NOT DETECTED    Comment: Performed at [facility], [address] [place], [place] [address]  Glucose, capillary     Status: Abnormal   Collection Time: 05/18/17  8:58 PM  Result Value Ref Range   Glucose-Capillary 198 (H) 65 - 99 mg/dL  Lactic acid, plasma     Status: Abnormal   Collection Time: 05/18/17 11:11 PM  Result Value Ref Range   Lactic Acid, Venous 3.1 (HH) 0.5 - 1.9 mmol/L    Comment: CRITICAL RESULT CALLED TO, READ BACK BY AND VERIFIED WITH BUTCH WOODS AT 2356 05/18/17 ALV Performed at [facility], [address] [place], [place] [address]   Glucose, capillary     Status: None   Collection Time: 05/19/17  1:00 AM  Result Value Ref Range   Glucose-Capillary 93 65 - 99 mg/dL  Lactic acid, plasma     Status: None   Collection Time: 05/19/17  2:02 AM  Result Value Ref Range   Lactic Acid,  Venous 1.5 0.5 - 1.9 mmol/L    Comment: Performed at [facility], [address] [place], [place] [address]  Cortisol     Status: None   Collection Time: 05/19/17  2:02 AM  Result Value Ref Range   Cortisol, Plasma 21.6 ug/dL    Comment: (NOTE) AM    6.7 - 22.6 ug/dL PM   <10.0       ug/dL Performed at [facility] [address] [place], [place] [address]   Troponin I     Status: None   Collection Time: 05/19/17  2:02 AM  Result Value Ref Range   Troponin I <0.03 <0.03 ng/mL    Comment: Performed at [facility], [address] [place], [place] [address]  Procalcitonin     Status: None   Collection Time: 05/19/17  2:02 AM  Result Value Ref Range   Procalcitonin 0.37 ng/mL    Comment:        Interpretation: PCT (Procalcitonin) <= 0.5 ng/mL: Systemic infection (sepsis) is not likely. Local bacterial infection is possible. (NOTE)       Sepsis PCT Algorithm           Lower Respiratory Tract                                      Infection PCT Algorithm    ----------------------------     ----------------------------         PCT < 0.25 ng/mL                PCT < 0.10 ng/mL         Strongly encourage             Strongly discourage   discontinuation of antibiotics    initiation of antibiotics    ----------------------------     -----------------------------       PCT 0.25 - 0.50 ng/mL            PCT 0.10 - 0.25 ng/mL               OR       >80% decrease in PCT            Discourage initiation of                                            antibiotics      Encourage discontinuation           of antibiotics    ----------------------------     -----------------------------         PCT >= 0.50 ng/mL              PCT 0.26 - 0.50 ng/mL               AND        <80% decrease in PCT             Encourage initiation of                                             antibiotics       Encourage continuation  of antibiotics    ----------------------------     -----------------------------        PCT >= 0.50 ng/mL                   PCT > 0.50 ng/mL               AND         increase in PCT                  Strongly encourage                                      initiation of antibiotics    Strongly encourage escalation           of antibiotics                                     -----------------------------                                           PCT <= 0.25 ng/mL                                                 OR                                        > 80% decrease in PCT                                     Discontinue / Do not initiate                                             antibiotics Performed at [facility], [address] [place], [place] [address]   MRSA PCR Screening     Status: None   Collection Time: 05/19/17  2:34 AM  Result Value Ref Range   MRSA by PCR NEGATIVE NEGATIVE    Comment:        The GeneXpert MRSA Assay (FDA approved for NASAL specimens only), is one component of a comprehensive MRSA colonization surveillance program. It is not intended to diagnose MRSA infection nor to guide or monitor treatment for MRSA infections. Performed at [facility], [address] [place], [place] [address]   Lactic acid, plasma     Status: None   Collection Time: 05/19/17  4:57 AM  Result Value Ref Range   Lactic Acid, Venous 1.3 0.5 - 1.9 mmol/L    Comment: Performed at [facility], [address] [place], [place] [address]  Basic metabolic panel     Status: Abnormal   Collection Time: 05/19/17  4:57 AM  Result Value Ref Range   Sodium 142 135 - 145 mmol/L   Potassium 3.4 (L) 3.5 -  5.1 mmol/L   Chloride 118 (H) 101 - 111 mmol/L   CO2 21 (L) 22 - 32 mmol/L   Glucose, Bld 125 (H) 65 - 99 mg/dL   BUN 22 (H) 6 - 20 mg/dL   Creatinine, Ser 0.44 (L) 0.61 - 1.24 mg/dL   Calcium 7.7 (L) 8.9 - 10.3 mg/dL   GFR calc non Af Amer >60 >60 mL/min   GFR calc Af Amer >60 >60 mL/min    Comment: (NOTE) The eGFR has been calculated using the CKD EPI equation. This  calculation has not been validated in all clinical situations. eGFR's persistently <60 mL/min signify possible Chronic Kidney Disease.    Anion gap 3 (L) 5 - 15    Comment: Performed at [facility], [address] [place], [place] [address]  CBC     Status: Abnormal   Collection Time: 05/19/17  4:57 AM  Result Value Ref Range   WBC 8.6 3.8 - 10.6 K/uL   RBC 3.72 (L) 4.40 - 5.90 MIL/uL   Hemoglobin 8.7 (L) 13.0 - 18.0 g/dL    Comment: RESULT REPEATED AND VERIFIED   HCT 27.6 (L) 40.0 - 52.0 %   MCV 74.4 (L) 80.0 - 100.0 fL   MCH 23.3 (L) 26.0 - 34.0 pg   MCHC 31.4 (L) 32.0 - 36.0 g/dL   RDW 16.0 (H) 11.5 - 14.5 %   Platelets 148 (L) 150 - 440 K/uL    Comment: Performed at [facility], [address] [place], [place] [address]  Glucose, capillary     Status: None   Collection Time: 05/19/17  7:38 AM  Result Value Ref Range   Glucose-Capillary 96 65 - 99 mg/dL  Occult blood card to lab, stool     Status: None   Collection Time: 05/19/17  6:47 PM  Result Value Ref Range   Fecal Occult Bld NEGATIVE NEGATIVE    Comment: Performed at [facility], [address] [place], [place] [address]  CBC     Status: Abnormal   Collection Time: 05/20/17  3:36 AM  Result Value Ref Range   WBC 4.7 3.8 - 10.6 K/uL   RBC 3.59 (L) 4.40 - 5.90 MIL/uL   Hemoglobin 8.4 (L) 13.0 - 18.0 g/dL   HCT 26.4 (L) 40.0 - 52.0 %   MCV 73.5 (L) 80.0 - 100.0 fL   MCH 23.4 (L) 26.0 - 34.0 pg   MCHC 31.9 (L) 32.0 - 36.0 g/dL   RDW 16.5 (H) 11.5 - 14.5 %   Platelets 132 (L) 150 - 440 K/uL    Comment: Performed at [facility], [address] [place], [place] [address]  Basic metabolic panel     Status: Abnormal   Collection Time: 05/20/17  3:36 AM  Result Value Ref Range   Sodium 139 135 - 145 mmol/L   Potassium 3.5 3.5 - 5.1 mmol/L   Chloride 110 101 - 111 mmol/L   CO2 24 22 - 32 mmol/L   Glucose, Bld 103 (H) 65 - 99 mg/dL   BUN 13 6 - 20 mg/dL   Creatinine, Ser  0.44 (L) 0.61 - 1.24 mg/dL   Calcium 8.1 (L) 8.9 - 10.3 mg/dL   GFR calc non Af Amer >60 >60 mL/min   GFR calc Af Amer >60 >60 mL/min    Comment: (NOTE) The eGFR has been calculated using the CKD EPI equation. This calculation has not been validated in all clinical situations. eGFR's persistently <60 mL/min  signify possible Chronic Kidney Disease.    Anion gap 5 5 - 15    Comment: Performed at [facility], [address] [place], [place] [address]  Glucose, capillary     Status: None   Collection Time: 05/20/17  7:42 AM  Result Value Ref Range   Glucose-Capillary 92 65 - 99 mg/dL   Comment 1 Notify RN    Comment 2 Document in Chart   Glucose, capillary     Status: None   Collection Time: 05/21/17  8:29 AM  Result Value Ref Range   Glucose-Capillary 89 65 - 99 mg/dL   Comment 1 Notify RN    Comment 2 Document in Chart      Assessment & Plan  1. Wound of buttock, unspecified laterality, initial encounter Was not able to examine the wound as patient has difficulty getting up from the wheelchair, based on descriptions from patient's caregivers, we'll refer to wound care - AMB referral to wound care center  2. Candida rash of groin Patient is at risk for 10 days infection of the groin and scrotal area cause of immobility, urinary catheterization etc., will provide nystatin powder to be applied to the area up to 3 times daily for treatment - nystatin (MYCOSTATIN/NYSTOP) powder; Apply topically 3 (three) times daily.  Dispense: 60 g; Refill: 0  3. Sepsis due to urinary tract infection (HCC) Appears to have resolved, obtain urinalysis and culture - Urine Culture - Urinalysis, Routine w reflex microscopic  4. Contusion of right great toe with damage to nail, initial encounter  - Ambulatory referral to [facility]. [facility] 05/31/2017 11:47 AM

## 2017-06-01 DIAGNOSIS — A419 Sepsis, unspecified organism: Secondary | ICD-10-CM | POA: Diagnosis not present

## 2017-06-01 DIAGNOSIS — N390 Urinary tract infection, site not specified: Secondary | ICD-10-CM | POA: Diagnosis not present

## 2017-06-02 ENCOUNTER — Other Ambulatory Visit: Payer: Self-pay | Admitting: Radiology

## 2017-06-02 LAB — URINALYSIS, ROUTINE W REFLEX MICROSCOPIC

## 2017-06-03 ENCOUNTER — Other Ambulatory Visit: Payer: Self-pay | Admitting: Radiology

## 2017-06-03 ENCOUNTER — Telehealth: Payer: Self-pay

## 2017-06-03 ENCOUNTER — Other Ambulatory Visit: Payer: Self-pay | Admitting: Urology

## 2017-06-03 DIAGNOSIS — R339 Retention of urine, unspecified: Secondary | ICD-10-CM

## 2017-06-03 NOTE — Telephone Encounter (Signed)
Referral will be sent as soon as Dr. Manuella Ghazi writes a rx for the requested services.

## 2017-06-03 NOTE — Telephone Encounter (Signed)
Referral for wound care was placed during his office visit on 05/31/2017, he also sees urology for urinary catheter. If they need a written prescription faxed over, we'll be happy to do it

## 2017-06-03 NOTE — Telephone Encounter (Signed)
Referral for wound care has already been placed which would help with the wound on his sacral or buttock area, he is going to see urology and does not need a separate referral for the Foley catheter

## 2017-06-03 NOTE — Telephone Encounter (Signed)
Copied from [place] [id number]. Topic: Referral - Request >> Jun 03, 2017  9:23 AM Arletha Grippe wrote: Reason for CRM: wife requesting home health care to come in for wound care on his butt. And also referral for catheter bag that pt is getting tomorrow. [facility] is asking [facility] out.the wound care called them, but they can not get pt out of the house.  Cb# [phone], call [phone] call this number second.  They need nurse to do both of these requests

## 2017-06-03 NOTE — Telephone Encounter (Signed)
Can you help me with this referral? It looks like a wound specialist referral has already been placed.

## 2017-06-04 ENCOUNTER — Telehealth: Payer: Self-pay | Admitting: Family Medicine

## 2017-06-04 ENCOUNTER — Ambulatory Visit: Payer: Medicare Other

## 2017-06-04 ENCOUNTER — Ambulatory Visit
Admission: RE | Admit: 2017-06-04 | Discharge: 2017-06-04 | Disposition: A | Discharge status: Home / Self Care | Payer: Medicare Other | Source: Ambulatory Visit | Attending: Urology | Admitting: Urology

## 2017-06-04 ENCOUNTER — Other Ambulatory Visit: Payer: Self-pay | Admitting: Radiology

## 2017-06-04 DIAGNOSIS — Z79899 Other long term (current) drug therapy: Secondary | ICD-10-CM | POA: Diagnosis not present

## 2017-06-04 DIAGNOSIS — Z882 Allergy status to sulfonamides status: Secondary | ICD-10-CM | POA: Diagnosis not present

## 2017-06-04 DIAGNOSIS — Z86718 Personal history of other venous thrombosis and embolism: Secondary | ICD-10-CM | POA: Diagnosis not present

## 2017-06-04 DIAGNOSIS — Z87440 Personal history of urinary (tract) infections: Secondary | ICD-10-CM | POA: Insufficient documentation

## 2017-06-04 DIAGNOSIS — R339 Retention of urine, unspecified: Secondary | ICD-10-CM

## 2017-06-04 DIAGNOSIS — Z7901 Long term (current) use of anticoagulants: Secondary | ICD-10-CM | POA: Diagnosis not present

## 2017-06-04 DIAGNOSIS — G809 Cerebral palsy, unspecified: Secondary | ICD-10-CM | POA: Diagnosis not present

## 2017-06-04 DIAGNOSIS — F329 Major depressive disorder, single episode, unspecified: Secondary | ICD-10-CM | POA: Diagnosis not present

## 2017-06-04 DIAGNOSIS — Z8249 Family history of ischemic heart disease and other diseases of the circulatory system: Secondary | ICD-10-CM | POA: Insufficient documentation

## 2017-06-04 DIAGNOSIS — Z833 Family history of diabetes mellitus: Secondary | ICD-10-CM | POA: Insufficient documentation

## 2017-06-04 DIAGNOSIS — Z9889 Other specified postprocedural states: Secondary | ICD-10-CM | POA: Insufficient documentation

## 2017-06-04 DIAGNOSIS — N319 Neuromuscular dysfunction of bladder, unspecified: Secondary | ICD-10-CM | POA: Diagnosis not present

## 2017-06-04 LAB — BASIC METABOLIC PANEL
Anion gap: 9 (ref 5–15)
BUN: 14 mg/dL (ref 6–20)
CALCIUM: 8.6 mg/dL — AB (ref 8.9–10.3)
CO2: 22 mmol/L (ref 22–32)
Chloride: 109 mmol/L (ref 101–111)
Creatinine, Ser: 0.48 mg/dL — ABNORMAL LOW (ref 0.61–1.24)
GFR calc non Af Amer: >60 mL/min (ref >60)
GLUCOSE: 101 mg/dL — AB (ref 65–99)
Potassium: 4 mmol/L (ref 3.5–5.1)
Sodium: 140 mmol/L (ref 135–145)

## 2017-06-04 LAB — URINALYSIS, ROUTINE W REFLEX MICROSCOPIC
BILIRUBIN URINE: NEGATIVE
Glucose, UA: NEGATIVE
HYALINE CAST: NONE SEEN /LPF
Hgb urine dipstick: NEGATIVE
Ketones, ur: NEGATIVE
Nitrite: NEGATIVE
PH: 6.5 (ref 5.0–8.0)
Protein, ur: NEGATIVE
SPECIFIC GRAVITY, URINE: 1.004 (ref 1.001–1.03)

## 2017-06-04 LAB — MICROSCOPIC MESSAGE

## 2017-06-04 LAB — PROTIME-INR
INR: 0.94
Prothrombin Time: 12.5 seconds (ref 11.4–15.2)

## 2017-06-04 MED ORDER — LIDOCAINE HCL 1 % IJ SOLN
INTRAMUSCULAR | Status: AC | PRN
  Administered 2017-06-04: 5 mL via INTRADERMAL

## 2017-06-04 MED ORDER — MIDAZOLAM HCL 2 MG/2ML IJ SOLN
INTRAMUSCULAR | Status: AC | PRN
  Administered 2017-06-04: 1 mg via INTRAVENOUS

## 2017-06-04 MED ORDER — CEFAZOLIN SODIUM-DEXTROSE 2-4 GM/100ML-% IV SOLN
2.0000 g | INTRAVENOUS | Status: AC
  Administered 2017-06-04: 2 g via INTRAVENOUS
  Filled 2017-06-04: qty 100

## 2017-06-04 MED ORDER — FENTANYL CITRATE (PF) 100 MCG/2ML IJ SOLN
INTRAMUSCULAR | Status: AC | PRN
  Administered 2017-06-04: 50 ug via INTRAVENOUS

## 2017-06-04 MED ORDER — SODIUM CHLORIDE 0.9 % IV SOLN
INTRAVENOUS | Status: DC
  Administered 2017-06-04: 10:00:00 via INTRAVENOUS

## 2017-06-04 MED ORDER — MIDAZOLAM HCL 2 MG/2ML IJ SOLN
INTRAMUSCULAR | Status: AC
  Filled 2017-06-04: qty 2

## 2017-06-04 MED ORDER — FENTANYL CITRATE (PF) 100 MCG/2ML IJ SOLN
INTRAMUSCULAR | Status: AC
  Filled 2017-06-04: qty 2

## 2017-06-04 NOTE — Consult Note (Signed)
Chief Complaint: Patient was seen in consultation today for suprapubic catheter placement  Referring Physician(s): Nickie Retort  Supervising Physician: Aletta Edouard  Patient Status: [facility] - Out-pt  History of Present Illness: LECIL TAPP is a 62 y.o. male with history of cerebral palsy, urinary retention/neurogenic bladder, frequent urinary tract infections necessitating Foley catheter placement who presents today for image guided suprapubic catheter placement.  Past Medical History:  Diagnosis Date  . Cerebral palsy ([place])   . Depression   . History of DVT of lower extremity   . Mood disorder ([place])   . Neurogenic bladder   . Urinary incontinence     Past Surgical History:  Procedure Laterality Date  . ESOPHAGOGASTRODUODENOSCOPY Left 02/07/2017   Procedure: ESOPHAGOGASTRODUODENOSCOPY (EGD);  Surgeon: Thornton Park, MD;  Location: [facility];  Service: Gastroenterology;  Laterality: Left;  . filter surgery for blood clot  2013  . HIP FRACTURE SURGERY  2013    Allergies: Sulfa antibiotics  Medications: Prior to Admission medications   Medication Sig Start Date End Date Taking? Authorizing Provider  acetaminophen (TYLENOL) 325 MG tablet Take 1 tablet (325 mg total) by mouth every 6 (six) hours as needed for mild pain (or Fever >/= 101). 02/08/17  Yes Gouru, Illene Silver, MD  ARIPiprazole (ABILIFY) 15 MG tablet TAKE 1 TABLET(15 MG) BY MOUTH DAILY 01/05/17  Yes Keith Rake Asad A, MD  bethanechol (URECHOLINE) 50 MG tablet TAKE 1 TABLET(50 MG) BY MOUTH THREE TIMES DAILY 01/05/17  Yes Roselee Nova, MD  bisacodyl (DULCOLAX) 5 MG EC tablet Take 1 tablet (5 mg total) by mouth daily as needed for moderate constipation. 05/21/17  Yes Vaughan Basta, MD  nystatin (MYCOSTATIN/NYSTOP) powder Apply topically 3 (three) times daily. 05/31/17  Yes Roselee Nova, MD  pantoprazole (PROTONIX) 40 MG tablet Take 1 tablet (40 mg total) by mouth 2 (two) times daily.  02/08/17 02/08/18 Yes Gouru, Aruna, MD  XARELTO 10 MG TABS tablet TAKE 1 TABLET(10 MG) BY MOUTH DAILY 02/18/17  Yes Roselee Nova, MD     Family History  Problem Relation Age of Onset  . Heart disease Mother   . Atrial fibrillation Mother   . Diabetes Mother        Borderline  . Heart disease Father        Passed away of heart attack  . Kidney disease Neg Hx   . Prostate cancer Neg Hx     Social History   Socioeconomic History  . Marital status: Single    Spouse name: None  . Number of children: None  . Years of education: None  . Highest education level: None  Social Needs  . Financial resource strain: None  . Food insecurity - worry: None  . Food insecurity - inability: None  . Transportation needs - medical: None  . Transportation needs - non-medical: None  Occupational History  . None  Tobacco Use  . Smoking status: Never Smoker  . Smokeless tobacco: Never Used  Substance and Sexual Activity  . Alcohol use: No    Alcohol/week: 0.0 oz  . Drug use: No  . Sexual activity: No  Other Topics Concern  . None  Social History Narrative  . None      Review of Systems denies fever, chest pain, dyspnea, cough, abdominal pain, back pain, nausea, vomiting; does have occasional headaches and prior hematuria.  Vital Signs: BP (!) 143/89   Pulse 93   Temp 98.4 F (36.9 C) (Oral)  Resp 20   Ht 5\' 10"  (1.778 m)   Wt 126 lb (57.2 kg)   SpO2 98%   BMI 18.08 kg/m   Physical Exam awake, conversant; Chest clear to auscultation bilaterally.  Heart with regular rate and rhythm.  Abdomen soft, positive bowel sounds, nontender.  No lower extremity edema  Imaging: Dg Chest Portable 1 View  Result Date: 05/18/2017 CLINICAL DATA:  Nausea vomiting and diarrhea EXAM: PORTABLE CHEST 1 VIEW COMPARISON:  02/23/2016 FINDINGS: Minimal scarring at the left base. No consolidation or effusion. Normal heart size. Aortic atherosclerosis. No pneumothorax IMPRESSION: No active disease.  Electronically Signed   By: Donavan Foil M.D.   On: 05/18/2017 21:17    Labs:  CBC: Recent Labs    05/12/17 1246 05/18/17 2048 05/19/17 0457 05/20/17 0336  WBC 5.0 23.9* 8.6 4.7  HGB 10.7* 11.7* 8.7* 8.4*  HCT 33.9* 37.6* 27.6* 26.4*  PLT 185 253 148* 132*    COAGS: Recent Labs    07/31/16 1112 08/27/16 1515 05/12/17 1246 05/18/17 2048  INR 3.9 2.2 1.00 1.75  APTT  --   --  29 32    BMP: Recent Labs    05/12/17 1246 05/18/17 2048 05/19/17 0457 05/20/17 0336  NA 138 143 142 139  K 3.8 3.2* 3.4* 3.5  CL 105 113* 118* 110  CO2 23 19* 21* 24  GLUCOSE 108* 200* 125* 103*  BUN 23* 28* 22* 13  CALCIUM 8.8* 9.1 7.7* 8.1*  CREATININE 0.48* 0.70 0.44* 0.44*  GFRNONAA >60 >60 >60 >60  GFRAA >60 >60 >60 >60    LIVER FUNCTION TESTS: Recent Labs    08/27/16 1540 02/05/17 1114 05/18/17 2048  BILITOT 0.5 0.6 <0.1*  AST 24 53* 70*  ALT 24 32 41  ALKPHOS 93 68 102  PROT 6.6 6.4* 7.5  ALBUMIN 4.3 3.3* 4.2    TUMOR MARKERS: No results for input(s): AFPTM, CEA, CA199, CHROMGRNA in the last 8760 hours.  Assessment and Plan: 62 y.o. male with history of cerebral palsy, urinary retention/neurogenic bladder, frequent urinary tract infections necessitating Foley catheter placement who presents today for image guided suprapubic catheter placement.  Details/risks of procedure, including but not limited to, internal bleeding, infection, injury to adjacent structures, discussed with patient/family with their understanding and consent. Labs pend.   Thank you for this interesting consult.  I greatly enjoyed meeting DEXTON ZWILLING and look forward to participating in their care.  A copy of this report was sent to the requesting provider on this date.  Electronically Signed: D. Rowe Wayde, PA-C 06/04/2017, 9:31 AM   I spent a total of  25 minutes   in face to face in clinical consultation, greater than 50% of which was counseling/coordinating care for suprapubic catheter  placement

## 2017-06-04 NOTE — Progress Notes (Signed)
Pt sitting up in bed eating and drinking at this time w/o difficulty in NAD, family at bedside.  Radiologist at bedside to discuss procedure.  Discharge and follow up instructions reviewed with pt and family who verbalize understanding.

## 2017-06-04 NOTE — Discharge Instructions (Signed)
Suprapubic Catheter Home Guide A suprapubic catheter is a rubber tube used to drain urine from the bladder into a collection bag. The catheter is inserted into the bladder through a small opening in the in the lower abdomen, near the center of the body, above the pubic bone (suprapubic area). There is a tiny balloon filled with germ-free (sterile) water on the end of the catheter that is in the bladder. The balloon helps to keep the catheter in place. Your suprapubic catheter may need to be replaced every 4-6 weeks, or as often as recommended by your health care provider. The collection bag must be emptied every day and cleaned every 2-3 days. The collection bag can be put beside your bed at night and attached to your leg during the day. You may have a large collection bag to use at night and a smaller one to use during the day. What are the risks?  Urine flow can become blocked. This can happen if the catheter is not working correctly, or if you have a blood clot in your bladder or in the catheter.  Tissue near the catheter may can become irritated and bleed.  Bacteria may get into your bladder and cause a urinary tract infection. How do I change the catheter? Supplies needed  Two pairs of sterile gloves.  Catheter.  Two syringes.  Sterile water.  Sterile cleaning solution.  Lubricant.  Collection bags. Changing the catheter To replace your catheter, take the following steps: 1. Drink plenty of fluids during the hours before you plan to change the catheter. 2. Wash your hands with soap and water. If soap and water are not available, use hand sanitizer. 3. Lie on your back and put on sterile gloves. 4. Clean the skin around the catheter opening using the sterile cleaning solution. 5. Remove the water from the balloon using a syringe. 6. Slowly remove the catheter. ? Do not pull on the catheter if it seems stuck. ? Call your health care provider immediately if you have difficulty  removing the catheter. 7. Take off the used gloves, and put on a new pair. 8. Put lubricant on the end of the new catheter that will go into your bladder. 9. Gently slide the catheter through the opening in your abdomen and into your bladder. 10. Wait for some urine to start flowing through the catheter. When urine starts to flow through the catheter, use a new syringe to fill the balloon with sterile water. 11. Attach the collection bag to the end of the catheter. Make sure the connection is tight. 12. Remove the gloves and wash your hands with soap and water.  How do I care for my skin around the catheter? Use a clean washcloth and soapy water to clean the skin around your catheter every day. Pat the area dry with a clean towel.  Do not pull on the catheter.  Do not use ointment or lotion on this area unless told by your health care provider.  Check your skin around the catheter every day for signs of infection. Check for: ? Redness, swelling, or pain. ? Fluid or blood. ? Warmth. ? Pus or a bad smell.  How do I clean and empty the collection bag? Clean the collection bag every 2-3 days, or as often as told by your health care provider. To do this, take the following steps:  Wash your hands with soap and water. If soap and water are not available, use hand sanitizer.  Disconnect the bag  from the catheter and immediately attach a new bag to the catheter. °· Empty the used bag completely. °· Clean the used bag using one of the following methods: °? Rinse the used bag with warm water and soap. °? Fill the bag with water and add 1 tsp of vinegar. Let it sit for about 30 minutes, then empty the bag. °· Let the bag dry completely, and put it in a clean plastic bag before storing it. ° °Empty the large collection bag every 8 hours. Empty the small collection bag when it is about ? full. To empty your large or small collection bag, take the following steps: °· Always keep the bag below the level  of the catheter. This keeps urine from flowing backwards into the catheter. °· Hold the bag over the toilet or another container. Turn the valve (spigot) at the bottom of the bag to empty the urine. °? Do not touch the opening of the spigot. °? Do not let the opening touch the toilet or container. °· Close the spigot tightly when the bag is empty. ° °What are some general tips? °· Always wash your hands before and after caring for your catheter and collection bag. Use a mild, fragrance-free soap. If soap and water are not available, use hand sanitizer. °· Clean the catheter with soap and water as often as told by your health care provider. °· Always make sure there are no twists or curls (kinks) in the catheter tube. °· Always make sure there are no leaks in the catheter or collection bag. °· Drink enough fluid to keep your urine clear or pale yellow. °· Do not take baths, swim, or use a hot tub. °When should I seek medical care? °Seek medical care if: °· You leak urine. °· You have redness, swelling, or pain around your catheter opening. °· You have fluid or blood coming from your catheter opening. °· Your catheter opening feels warm to the touch. °· You have pus or a bad smell coming from your catheter opening. °· You have a fever or chills. °· Your urine flow slows down. °· Your urine becomes cloudy or smelly. ° °When should I seek immediate medical care? °Seek immediate medical care if your catheter comes out, or if you have: °· Nausea. °· Back pain. °· Difficulty changing your catheter. °· Blood in your urine. °· No urine flow for 1 hour. ° °This information is not intended to replace advice given to you by your health care provider. Make sure you discuss any questions you have with your health care provider. °Document Released: 01/27/2011 Document Revised: 01/08/2016 Document Reviewed: 01/22/2015 °Elsevier Interactive Patient Education © 2018 [employer] ° °

## 2017-06-04 NOTE — Procedures (Signed)
Interventional Radiology Procedure Note  Procedure: CT guided suprapubic bladder catheter placement   Complications: None  Estimated Blood Loss: < 10 mL  Findings: Bladder distended with 200 mL of sterile water through Foley catheter. 12 Fr pigtail suprapubic drainage catheter placed into bladder lumen. Foley catheter removed.  Gerald Montgomery. Kathlene Cote, M.D Pager:  [phone]

## 2017-06-04 NOTE — Telephone Encounter (Signed)
Copied from [place] [id number]. Topic: Quick Communication - See Telephone Encounter >> Jun 04, 2017  4:08 PM Bea Graff, NT wrote: CRM for notification. See Telephone encounter for: Pt states he was told [facility] was suppose to be coming out to see the pt. Amy from [facility] stated they have not heard from the PCP regarding this pt needing home care after his surgery. Please advise.   06/04/17.

## 2017-06-04 NOTE — Sedation Documentation (Signed)
200 ml sterile water via foley into bladder per Dr Kathlene Cote order

## 2017-06-05 LAB — URINE CULTURE
MICRO NUMBER: 90029906
SPECIMEN QUALITY:: ADEQUATE

## 2017-06-07 ENCOUNTER — Other Ambulatory Visit: Payer: Self-pay

## 2017-06-07 ENCOUNTER — Telehealth: Payer: Self-pay | Admitting: Family Medicine

## 2017-06-07 ENCOUNTER — Encounter: Payer: Self-pay | Admitting: Emergency Medicine

## 2017-06-07 ENCOUNTER — Emergency Department
Admission: EM | Admit: 2017-06-07 | Discharge: 2017-06-07 | Disposition: A | Discharge status: Home / Self Care | Payer: Medicare Other | Attending: Student in an Organized Health Care Education/Training Program | Admitting: Student in an Organized Health Care Education/Training Program

## 2017-06-07 DIAGNOSIS — M6281 Muscle weakness (generalized): Secondary | ICD-10-CM | POA: Diagnosis not present

## 2017-06-07 DIAGNOSIS — R8290 Unspecified abnormal findings in urine: Secondary | ICD-10-CM | POA: Diagnosis not present

## 2017-06-07 DIAGNOSIS — Z9359 Other cystostomy status: Secondary | ICD-10-CM | POA: Diagnosis not present

## 2017-06-07 DIAGNOSIS — L89151 Pressure ulcer of sacral region, stage 1: Secondary | ICD-10-CM | POA: Diagnosis not present

## 2017-06-07 DIAGNOSIS — Z7401 Bed confinement status: Secondary | ICD-10-CM | POA: Diagnosis not present

## 2017-06-07 DIAGNOSIS — Z79899 Other long term (current) drug therapy: Secondary | ICD-10-CM | POA: Diagnosis not present

## 2017-06-07 DIAGNOSIS — Z960 Presence of urogenital implants: Secondary | ICD-10-CM | POA: Diagnosis not present

## 2017-06-07 LAB — COMPREHENSIVE METABOLIC PANEL
ALT: 17 U/L (ref 17–63)
AST: 23 U/L (ref 15–41)
Albumin: 3.9 g/dL (ref 3.5–5.0)
Alkaline Phosphatase: 82 U/L (ref 38–126)
Anion gap: 8 (ref 5–15)
BUN: 15 mg/dL (ref 6–20)
CHLORIDE: 108 mmol/L (ref 101–111)
CO2: 24 mmol/L (ref 22–32)
CREATININE: 0.59 mg/dL — AB (ref 0.61–1.24)
Calcium: 8.8 mg/dL — ABNORMAL LOW (ref 8.9–10.3)
GFR calc Af Amer: >60 mL/min (ref >60)
Glucose, Bld: 104 mg/dL — ABNORMAL HIGH (ref 65–99)
POTASSIUM: 3.8 mmol/L (ref 3.5–5.1)
SODIUM: 140 mmol/L (ref 135–145)
Total Bilirubin: 0.4 mg/dL (ref 0.3–1.2)
Total Protein: 6.9 g/dL (ref 6.5–8.1)

## 2017-06-07 LAB — CBC WITH DIFFERENTIAL/PLATELET
BASOS ABS: 0 10*3/uL (ref 0–0.1)
Basophils Relative: 1 %
EOS ABS: 0.1 10*3/uL (ref 0–0.7)
EOS PCT: 1 %
HCT: 31.4 % — ABNORMAL LOW (ref 40.0–52.0)
Hemoglobin: 10 g/dL — ABNORMAL LOW (ref 13.0–18.0)
Lymphocytes Relative: 15 %
Lymphs Abs: 0.8 10*3/uL — ABNORMAL LOW (ref 1.0–3.6)
MCH: 23 pg — ABNORMAL LOW (ref 26.0–34.0)
MCHC: 31.7 g/dL — ABNORMAL LOW (ref 32.0–36.0)
MCV: 72.6 fL — AB (ref 80.0–100.0)
MONO ABS: 0.5 10*3/uL (ref 0.2–1.0)
Monocytes Relative: 10 %
Neutro Abs: 3.7 10*3/uL (ref 1.4–6.5)
Neutrophils Relative %: 73 %
PLATELETS: 210 10*3/uL (ref 150–440)
RBC: 4.32 MIL/uL — AB (ref 4.40–5.90)
RDW: 17.1 % — AB (ref 11.5–14.5)
WBC: 5 10*3/uL (ref 3.8–10.6)

## 2017-06-07 LAB — URINALYSIS, COMPLETE (UACMP) WITH MICROSCOPIC
BILIRUBIN URINE: NEGATIVE
Glucose, UA: NEGATIVE mg/dL
KETONES UR: NEGATIVE mg/dL
NITRITE: POSITIVE — AB
PROTEIN: NEGATIVE mg/dL
Specific Gravity, Urine: 1.004 — ABNORMAL LOW (ref 1.005–1.030)
Squamous Epithelial / LPF: NONE SEEN
pH: 6 (ref 5.0–8.0)

## 2017-06-07 NOTE — Telephone Encounter (Signed)
[facility] calling to see if patient still needs skilled nursing, family denied on 05/27/17. Please advise

## 2017-06-07 NOTE — ED Notes (Signed)
Pt to be transported home via [facility] d/t non-ambulatory bed-bound status. Pt and family verbalized understanding of discharge instructions and follow-up care. VSS. Skin warm and dry. A&O x4.   Pt mother and sister will be at house when pt arrives.

## 2017-06-07 NOTE — Telephone Encounter (Signed)
Based on patient's discharge summary, he would benefit from home health and skilled nursing services based on the presence of Foley catheter, which is being managed by urology

## 2017-06-07 NOTE — ED Provider Notes (Addendum)
[facility] Emergency Department Provider Note    First MD Initiated Contact with Patient 06/07/17 1908     (approximate)  I have reviewed the triage vital signs and the nursing notes.   HISTORY  Chief Complaint Urinary Tract Infection (sent from PCP for [place] in Urine )  Level V Caveat:  CP, mood disorder HPI Gerald Montgomery is a 62 y.o. male recent hospitalization for sepsis with chronic indwelling Foley status post CT-guided suprapubic catheter placement on the 11th of this month presents for evaluation of urinalysis growing E. coli.  Patient without any fevers at home.  Otherwise at baseline.  Patient completed his course of antibiotics at home.  The abnormal urinalysis was drawn prior to the catheter placement.  He has not had any fevers.  He did complete his  course of cefuroxime after discharge from the hospital.   Past Medical History:  Diagnosis Date  . Cerebral palsy ([place])   . Depression   . History of DVT of lower extremity   . Mood disorder ([place])   . Neurogenic bladder   . Urinary incontinence    Family History  Problem Relation Age of Onset  . Heart disease Mother   . Atrial fibrillation Mother   . Diabetes Mother        Borderline  . Heart disease Father        Passed away of heart attack  . Kidney disease Neg Hx   . Prostate cancer Neg Hx    Past Surgical History:  Procedure Laterality Date  . ESOPHAGOGASTRODUODENOSCOPY Left 02/07/2017   Procedure: ESOPHAGOGASTRODUODENOSCOPY (EGD);  Surgeon: Thornton Park, MD;  Location: [place];  Service: Gastroenterology;  Laterality: Left;  . filter surgery for blood clot  2013  . HIP FRACTURE SURGERY  2013   Patient Active Problem List   Diagnosis Date Noted  . Pressure injury of skin 02/06/2017  . Sepsis ([place]) 02/05/2017  . Stage I pressure ulcer of right upper back 08/27/2016  . Abnormal urine odor 07/16/2016  . Need for home health care 06/17/2016  . Hyperglycemia  03/04/2016  . Screening for lipid disorders 03/04/2016  . LLL pneumonia ([place]) 02/12/2016  . Cerumen impaction 02/12/2016  . Pneumonia 02/05/2016  . Cerebral palsy ([place]) 12/05/2015  . Hematuria, gross 10/07/2015  . History of DVT of lower extremity 04/11/2015  . Mood disorder ([place]) 04/11/2015  . Neurogenic bladder 04/11/2015      Prior to Admission medications   Medication Sig Start Date End Date Taking? Authorizing Provider  acetaminophen (TYLENOL) 325 MG tablet Take 1 tablet (325 mg total) by mouth every 6 (six) hours as needed for mild pain (or Fever >/= 101). 02/08/17   Gouru, Illene Silver, MD  ARIPiprazole (ABILIFY) 15 MG tablet TAKE 1 TABLET(15 MG) BY MOUTH DAILY 01/05/17   Roselee Nova, MD  bethanechol (URECHOLINE) 50 MG tablet TAKE 1 TABLET(50 MG) BY MOUTH THREE TIMES DAILY 01/05/17   Roselee Nova, MD  bisacodyl (DULCOLAX) 5 MG EC tablet Take 1 tablet (5 mg total) by mouth daily as needed for moderate constipation. 05/21/17   Vaughan Basta, MD  nystatin (MYCOSTATIN/NYSTOP) powder Apply topically 3 (three) times daily. 05/31/17   Roselee Nova, MD  pantoprazole (PROTONIX) 40 MG tablet Take 1 tablet (40 mg total) by mouth 2 (two) times daily. 02/08/17 02/08/18  Nicholes Mango, MD  XARELTO 10 MG TABS tablet TAKE 1 TABLET(10 MG) BY MOUTH DAILY 02/18/17   Rochel Brome  A, MD    Allergies Macrobid [nitrofurantoin] and Sulfa antibiotics    Social History Social History   Tobacco Use  . Smoking status: Never Smoker  . Smokeless tobacco: Never Used  Substance Use Topics  . Alcohol use: No    Alcohol/week: 0.0 oz  . Drug use: No    Review of Systems Patient denies headaches, rhinorrhea, blurry vision, numbness, shortness of breath, chest pain, edema, cough, abdominal pain, nausea, vomiting, diarrhea, dysuria, fevers, rashes or hallucinations unless otherwise stated above in HPI. ____________________________________________   PHYSICAL EXAM:  VITAL SIGNS: Vitals:     06/07/17 1927  BP: (!) 147/91  Pulse: 96  Resp: 16  Temp: 97.6 F (36.4 C)  SpO2: 100%    Constitutional: Alert chronically ill appearing in no acute distress. Eyes: Conjunctivae are normal.  Head: Atraumatic. Nose: No congestion/rhinnorhea. Mouth/Throat: Mucous membranes are moist.   Neck: No stridor. Painless ROM.  Cardiovascular: Normal rate, regular rhythm. Grossly normal heart sounds.  Good peripheral circulation. Respiratory: Normal respiratory effort.  No retractions. Lungs CTAB. Gastrointestinal: Soft and nontender. No distention. No abdominal bruits. No CVA tenderness. Genitourinary: Status post suprapubic catheterization that appears appropriately in place w/no surrounding cellulitis or erythema.   Musculoskeletal: Stage I-II sacral decubitus ulcer no lower extremity tenderness nor edema.  No joint effusions. Neurologic:  . No gross focal neurologic deficits are appreciated. Skin:  Skin is warm, dry and intact. No rash noted. ____________________________________________   LABS (all labs ordered are listed, but only abnormal results are displayed)  Results for orders placed or performed during the hospital encounter of 06/07/17 (from the past 24 hour(s))  CBC with Differential/Platelet     Status: Abnormal   Collection Time: 06/07/17  7:14 PM  Result Value Ref Range   WBC 5.0 3.8 - 10.6 K/uL   RBC 4.32 (L) 4.40 - 5.90 MIL/uL   Hemoglobin 10.0 (L) 13.0 - 18.0 g/dL   HCT 31.4 (L) 40.0 - 52.0 %   MCV 72.6 (L) 80.0 - 100.0 fL   MCH 23.0 (L) 26.0 - 34.0 pg   MCHC 31.7 (L) 32.0 - 36.0 g/dL   RDW 17.1 (H) 11.5 - 14.5 %   Platelets 210 150 - 440 K/uL   Neutrophils Relative % 73 %   Neutro Abs 3.7 1.4 - 6.5 K/uL   Lymphocytes Relative 15 %   Lymphs Abs 0.8 (L) 1.0 - 3.6 K/uL   Monocytes Relative 10 %   Monocytes Absolute 0.5 0.2 - 1.0 K/uL   Eosinophils Relative 1 %   Eosinophils Absolute 0.1 0 - 0.7 K/uL   Basophils Relative 1 %   Basophils Absolute 0.0 0 - 0.1  K/uL  Comprehensive metabolic panel     Status: Abnormal   Collection Time: 06/07/17  7:14 PM  Result Value Ref Range   Sodium 140 135 - 145 mmol/L   Potassium 3.8 3.5 - 5.1 mmol/L   Chloride 108 101 - 111 mmol/L   CO2 24 22 - 32 mmol/L   Glucose, Bld 104 (H) 65 - 99 mg/dL   BUN 15 6 - 20 mg/dL   Creatinine, Ser 0.59 (L) 0.61 - 1.24 mg/dL   Calcium 8.8 (L) 8.9 - 10.3 mg/dL   Total Protein 6.9 6.5 - 8.1 g/dL   Albumin 3.9 3.5 - 5.0 g/dL   AST 23 15 - 41 U/L   ALT 17 17 - 63 U/L   Alkaline Phosphatase 82 38 - 126 U/L   Total Bilirubin 0.4 0.3 -  1.2 mg/dL   GFR calc non Af Amer >60 >60 mL/min   GFR calc Af Amer >60 >60 mL/min   Anion gap 8 5 - 15   ____________________________________________ ____________________________________________   PROCEDURES  Procedure(s) performed:  Procedures    Critical Care performed: no ____________________________________________   INITIAL IMPRESSION / ASSESSMENT AND PLAN / ED COURSE  Pertinent labs & imaging results that were available during my care of the patient were reviewed by me and considered in my medical decision making (see chart for details).  DDX: UTI, sepsis, bacteremia, electrolyte abnormality, decubitus ulcer  Gerald Montgomery is a 62 y.o. who presents to the ED with above presentation.  He is afebrile Heema dynamically stable.  Blood work sent for the above differential shows no evidence of leukocytosis.  He has no signs or symptoms of sepsis.  His abdominal exam is soft and benign.  Given his recent hospitalization urinary tract infections I spoke with Dr. Ola Spurr of infectious disease regarding the patient's presentation.  Given his lack of symptoms and the urine culture abnormality being from the eighth of this month and subsequently tolerating suprapubic catheterization without interval antibiotics or sepsis I do believe that this is more likely contaminant versus chronic bacturia given his h/o of recurrent UTIs.  At this  time we will repeat urinalysis and culture and have patient follow-up as an outpatient.  At this point I do not believe that additional antibiotics are clinically indicated.  Patient will also be provided wound covering for decubitus ulcer.  Have discussed with the patient and available family all diagnostics and treatments performed thus far and all questions were answered to the best of my ability. The patient demonstrates understanding and agreement with plan.       ____________________________________________   FINAL CLINICAL IMPRESSION(S) / ED DIAGNOSES  Final diagnoses:  Suprapubic catheter (HCC)  Abnormal urinalysis  Pressure injury of sacral region, stage 1      NEW MEDICATIONS STARTED DURING THIS VISIT:  New Prescriptions   No medications on file     Note:  This document was prepared using Dragon voice recognition software and may include unintentional dictation errors.    Merlyn Lot, MD 06/07/17 2016    Merlyn Lot, MD 06/07/17 [phone]

## 2017-06-07 NOTE — Telephone Encounter (Signed)
Copied from [place] [phone]. Topic: General - Other >> Jun 07, 2017 11:18 AM Yvette Rack wrote: Reason for CRM: patient family member calling stating that pt need a RX for a bed overlay Gel mattress  sent to [facility]

## 2017-06-07 NOTE — Telephone Encounter (Signed)
Copied from [place] [phone]. Topic: Inquiry >> Jun 07, 2017 10:10 AM Malena Catholic I, NT wrote: Reason for CRM: Pt Family call and state pt need a Wound Nurse a soon  it possible the said the call feud times and noting  Happen

## 2017-06-07 NOTE — Telephone Encounter (Signed)
Patient's sister [name] reports that apparently the wound is not better but has actually gotten worse after the pads that were apparently brought by [facility] staff for the wound were applied to his buttock wounds. They have not seeing a wound care specialist as of now. I did inform them that should immediately see a wound care provider who can diagnose and prescribe appropriate treatment for patient's wounds. If  a gel mattress is required, I should be able to sign off on it once I see the notes and the need for a gel mattress from the wound care specialist. Patient's sister verbalized agreement and would like [place] to schedule an appointment with wound care as soon as possible. Thank you

## 2017-06-07 NOTE — ED Triage Notes (Signed)
Pt bib [facility] from home (lives with mom and sister) d/t UTI. PCP called pt to be brought in for tx of ecoli present in urine culture. Pt had recent admission for same 1/2 weeks ago per EMS. Pt presents with VSS, afebrile. Hx cerebral palsy

## 2017-06-07 NOTE — Telephone Encounter (Signed)
Spoke to pt sister and she states the ulcer that's on his bottom has gotten worst. She states she need a nurse to come out. They call her states that will need to bring them but she states that's not what they asked for. They state the order was  For the patient to be brought up to them. I don't see that the order was put in for the pt to be brought to them. I suggest to call the number back and see what they need to have it switched so the nurse can come out to them. Pt sister states that she is really concern with the the bed. The bed rails has broken off and the mattress is horrible ( pt sister words). Asking for gel mattress

## 2017-06-07 NOTE — Discharge Instructions (Signed)
Return to the ER if you develop any fevers, worsening abdominal pain nausea or vomiting.

## 2017-06-10 ENCOUNTER — Ambulatory Visit: Payer: Medicare Other

## 2017-06-13 DIAGNOSIS — Z86718 Personal history of other venous thrombosis and embolism: Secondary | ICD-10-CM | POA: Diagnosis not present

## 2017-06-13 DIAGNOSIS — L89312 Pressure ulcer of right buttock, stage 2: Secondary | ICD-10-CM | POA: Diagnosis not present

## 2017-06-13 DIAGNOSIS — Z87440 Personal history of urinary (tract) infections: Secondary | ICD-10-CM | POA: Diagnosis not present

## 2017-06-13 DIAGNOSIS — Z7901 Long term (current) use of anticoagulants: Secondary | ICD-10-CM | POA: Diagnosis not present

## 2017-06-13 DIAGNOSIS — N319 Neuromuscular dysfunction of bladder, unspecified: Secondary | ICD-10-CM | POA: Diagnosis not present

## 2017-06-13 DIAGNOSIS — L89322 Pressure ulcer of left buttock, stage 2: Secondary | ICD-10-CM | POA: Diagnosis not present

## 2017-06-13 DIAGNOSIS — F39 Unspecified mood [affective] disorder: Secondary | ICD-10-CM | POA: Diagnosis not present

## 2017-06-13 DIAGNOSIS — B3749 Other urogenital candidiasis: Secondary | ICD-10-CM | POA: Diagnosis not present

## 2017-06-13 DIAGNOSIS — S90221D Contusion of right lesser toe(s) with damage to nail, subsequent encounter: Secondary | ICD-10-CM | POA: Diagnosis not present

## 2017-06-13 DIAGNOSIS — Z9350 Unspecified cystostomy status: Secondary | ICD-10-CM | POA: Diagnosis not present

## 2017-06-13 DIAGNOSIS — R32 Unspecified urinary incontinence: Secondary | ICD-10-CM | POA: Diagnosis not present

## 2017-06-13 DIAGNOSIS — G809 Cerebral palsy, unspecified: Secondary | ICD-10-CM | POA: Diagnosis not present

## 2017-06-15 ENCOUNTER — Telehealth: Payer: Self-pay | Admitting: Family Medicine

## 2017-06-15 NOTE — Telephone Encounter (Signed)
Copied from [place]. Topic: Quick Communication - See Telephone Encounter >> Jun 15, 2017  1:05 PM Aurelio Brash B wrote: CRM for notification. See Telephone encounter for:  Misty, RN at [facility]  called for verbal orders for nursing  2 x 1, 1 x 3, then once every other week  for 4 weeks  her contact number is [phone] 06/15/17.

## 2017-06-16 ENCOUNTER — Telehealth: Payer: Self-pay | Admitting: Family Medicine

## 2017-06-16 NOTE — Telephone Encounter (Signed)
Copied from [place]. Topic: Quick Communication - See Telephone Encounter >> Jun 16, 2017  4:16 PM Corie Chiquito, [place] wrote: CRM for notification. See Telephone encounter for: Rojelio Brenner called again because she still needs verbal orders for this patient. 2 times a week for 1 week, 1 time a week for 3 weeks. Then once every other week for 4 weeks. If someone could give her a call back about this at [phone]  06/16/17.

## 2017-06-17 NOTE — Telephone Encounter (Signed)
Agree with the frequency of nursing schedule as suggested by [facility] staff. Reviewed notes from the ER

## 2017-06-17 NOTE — Telephone Encounter (Signed)
Spoke to [place] and let her know that Dr. Manuella Ghazi agreed to verbal orders that she requested on 06/16/2017 and the 06/15/2017

## 2017-06-19 DIAGNOSIS — S90221D Contusion of right lesser toe(s) with damage to nail, subsequent encounter: Secondary | ICD-10-CM | POA: Diagnosis not present

## 2017-06-19 DIAGNOSIS — G809 Cerebral palsy, unspecified: Secondary | ICD-10-CM | POA: Diagnosis not present

## 2017-06-19 DIAGNOSIS — N319 Neuromuscular dysfunction of bladder, unspecified: Secondary | ICD-10-CM | POA: Diagnosis not present

## 2017-06-19 DIAGNOSIS — L89322 Pressure ulcer of left buttock, stage 2: Secondary | ICD-10-CM | POA: Diagnosis not present

## 2017-06-19 DIAGNOSIS — Z9350 Unspecified cystostomy status: Secondary | ICD-10-CM | POA: Diagnosis not present

## 2017-06-19 DIAGNOSIS — L89312 Pressure ulcer of right buttock, stage 2: Secondary | ICD-10-CM | POA: Diagnosis not present

## 2017-06-21 DIAGNOSIS — Z9350 Unspecified cystostomy status: Secondary | ICD-10-CM | POA: Diagnosis not present

## 2017-06-21 DIAGNOSIS — L89312 Pressure ulcer of right buttock, stage 2: Secondary | ICD-10-CM | POA: Diagnosis not present

## 2017-06-21 DIAGNOSIS — L89322 Pressure ulcer of left buttock, stage 2: Secondary | ICD-10-CM | POA: Diagnosis not present

## 2017-06-21 DIAGNOSIS — N319 Neuromuscular dysfunction of bladder, unspecified: Secondary | ICD-10-CM | POA: Diagnosis not present

## 2017-06-21 DIAGNOSIS — G809 Cerebral palsy, unspecified: Secondary | ICD-10-CM | POA: Diagnosis not present

## 2017-06-21 DIAGNOSIS — S90221D Contusion of right lesser toe(s) with damage to nail, subsequent encounter: Secondary | ICD-10-CM | POA: Diagnosis not present

## 2017-06-23 DIAGNOSIS — G809 Cerebral palsy, unspecified: Secondary | ICD-10-CM | POA: Diagnosis not present

## 2017-06-23 DIAGNOSIS — L89322 Pressure ulcer of left buttock, stage 2: Secondary | ICD-10-CM | POA: Diagnosis not present

## 2017-06-23 DIAGNOSIS — N319 Neuromuscular dysfunction of bladder, unspecified: Secondary | ICD-10-CM | POA: Diagnosis not present

## 2017-06-23 DIAGNOSIS — S90221D Contusion of right lesser toe(s) with damage to nail, subsequent encounter: Secondary | ICD-10-CM | POA: Diagnosis not present

## 2017-06-23 DIAGNOSIS — Z9350 Unspecified cystostomy status: Secondary | ICD-10-CM | POA: Diagnosis not present

## 2017-06-23 DIAGNOSIS — L89312 Pressure ulcer of right buttock, stage 2: Secondary | ICD-10-CM | POA: Diagnosis not present

## 2017-06-24 ENCOUNTER — Other Ambulatory Visit: Payer: Self-pay | Admitting: Radiology

## 2017-06-24 ENCOUNTER — Telehealth: Payer: Self-pay | Admitting: Radiology

## 2017-06-24 DIAGNOSIS — R339 Retention of urine, unspecified: Secondary | ICD-10-CM

## 2017-06-24 NOTE — Telephone Encounter (Signed)
Notified pt's sister, [name], of SPT upsize/exchange scheduled in IR on 07/07/2017 with arrival time of 12:30. Instructions given. Sister voices understanding with no further questions at this time.

## 2017-06-24 NOTE — Progress Notes (Signed)
error 

## 2017-06-25 ENCOUNTER — Ambulatory Visit (INDEPENDENT_AMBULATORY_CARE_PROVIDER_SITE_OTHER): Payer: Medicare Other | Admitting: Urology

## 2017-06-25 ENCOUNTER — Encounter: Payer: Self-pay | Admitting: Urology

## 2017-06-25 VITALS — BP 127/71 | HR 97 | Ht 70.0 in | Wt 125.0 lb

## 2017-06-25 DIAGNOSIS — N390 Urinary tract infection, site not specified: Secondary | ICD-10-CM

## 2017-06-25 NOTE — Progress Notes (Signed)
06/25/2017 10:17 AM   Gerald Montgomery [birth year] [id number]  Referring provider: Roselee Nova, MD [address] [place], [place] [address]  No chief complaint on file.   HPI: The patient is a 62 year old gentleman with possible history of urinary retention and recurrent UTIs that improved after drainage of his bladder presents today for follow-up.  He did recently have a 12 French suprapubic catheter placed.  He developed urinary sepsis after this per notes.  Patient cystoscopy feels this was more his response to anesthesia than his true infectious presentation.  He presents today for evaluation by the recommendation of interventional radiology prior to upsizing of his suprapubic catheter.  Please see office note dated 04/30/2017 for full urological history.   PMH: Past Medical History:  Diagnosis Date  . Cerebral palsy ([place])   . Depression   . History of DVT of lower extremity   . Mood disorder ([place])   . Neurogenic bladder   . Urinary incontinence     Surgical History: Past Surgical History:  Procedure Laterality Date  . ESOPHAGOGASTRODUODENOSCOPY Left 02/07/2017   Procedure: ESOPHAGOGASTRODUODENOSCOPY (EGD);  Surgeon: Thornton Park, MD;  Location: [place];  Service: Gastroenterology;  Laterality: Left;  . filter surgery for blood clot  2013  . HIP FRACTURE SURGERY  2013    Home Medications:  Allergies as of 06/25/2017      Reactions   Macrobid [nitrofurantoin]    Sulfa Antibiotics Itching      Medication List        Accurate as of 06/25/17 10:17 AM. Always use your most recent med list.          acetaminophen 325 MG tablet Commonly known as:  TYLENOL Take 1 tablet (325 mg total) by mouth every 6 (six) hours as needed for mild pain (or Fever >/= 101).   ARIPiprazole 15 MG tablet Commonly known as:  ABILIFY TAKE 1 TABLET(15 MG) BY MOUTH DAILY   bethanechol 50 MG tablet Commonly known as:  URECHOLINE TAKE 1 TABLET(50 MG) BY MOUTH  THREE TIMES DAILY   bisacodyl 5 MG EC tablet Commonly known as:  DULCOLAX Take 1 tablet (5 mg total) by mouth daily as needed for moderate constipation.   nystatin powder Commonly known as:  MYCOSTATIN/NYSTOP Apply topically 3 (three) times daily.   pantoprazole 40 MG tablet Commonly known as:  PROTONIX Take 1 tablet (40 mg total) by mouth 2 (two) times daily.   XARELTO 10 MG Tabs tablet Generic drug:  rivaroxaban TAKE 1 TABLET(10 MG) BY MOUTH DAILY       Allergies:  Allergies  Allergen Reactions  . Macrobid [Nitrofurantoin]   . Sulfa Antibiotics Itching    Family History: Family History  Problem Relation Age of Onset  . Heart disease Mother   . Atrial fibrillation Mother   . Diabetes Mother        Borderline  . Heart disease Father        Passed away of heart attack  . Kidney disease Neg Hx   . Prostate cancer Neg Hx     Social History:  reports that  has never smoked. he has never used smokeless tobacco. He reports that he does not drink alcohol or use drugs.  ROS: UROLOGY Frequent Urination?: No Hard to postpone urination?: No Burning/pain with urination?: No Get up at night to urinate?: No Leakage of urine?: No Urine stream starts and stops?: No Trouble starting stream?: No Do you have to strain to urinate?:  No Blood in urine?: No Urinary tract infection?: No Sexually transmitted disease?: No Injury to kidneys or bladder?: No Painful intercourse?: No Weak stream?: No Erection problems?: No Penile pain?: No  Gastrointestinal Nausea?: No Vomiting?: No Indigestion/heartburn?: No Diarrhea?: No Constipation?: No  Constitutional Fever: No Night sweats?: No Weight loss?: No Fatigue?: No  Skin Skin rash/lesions?: No Itching?: No  Eyes Blurred vision?: No Double vision?: No  Ears/Nose/Throat Sore throat?: No Sinus problems?: No  Hematologic/Lymphatic Swollen glands?: No Easy bruising?: No  Cardiovascular Leg swelling?: No Chest  pain?: No  Respiratory Cough?: No Shortness of breath?: No  Endocrine Excessive thirst?: No  Musculoskeletal Back pain?: No Joint pain?: Yes  Neurological Headaches?: No Dizziness?: No  Psychologic Depression?: No Anxiety?: Yes  Physical Exam: BP 127/71 (BP Location: Left Arm, Patient Position: Sitting, Cuff Size: Normal)   Pulse 97   Ht 5\' 10"  (1.778 m)   Wt 125 lb (56.7 kg)   BMI 17.94 kg/m   Constitutional:  Alert and oriented, No acute distress. HEENT: [place] AT, moist mucus membranes.  Trachea midline, no masses. Cardiovascular: No clubbing, cyanosis, or edema. Respiratory: Normal respiratory effort, no increased work of breathing. GI: Abdomen is soft, nontender, nondistended, no abdominal masses GU: No CVA tenderness.  Skin: No rashes, bruises or suspicious lesions. Lymph: No cervical or inguinal adenopathy. Neurologic: Grossly intact, no focal deficits, moving all 4 extremities. Psychiatric: Normal mood and affect.  Laboratory Data: Lab Results  Component Value Date   WBC 5.0 06/07/2017   HGB 10.0 (L) 06/07/2017   HCT 31.4 (L) 06/07/2017   MCV 72.6 (L) 06/07/2017   PLT 210 06/07/2017    Lab Results  Component Value Date   CREATININE 0.59 (L) 06/07/2017    No results found for: PSA  No results found for: TESTOSTERONE  Lab Results  Component Value Date   HGBA1C 5.2 03/04/2016    Urinalysis    Component Value Date/Time   COLORURINE STRAW (A) 06/07/2017 2000   APPEARANCEUR CLEAR (A) 06/07/2017 2000   APPEARANCEUR Cloudy (A) 04/14/2017 1505   LABSPEC 1.004 (L) 06/07/2017 2000   LABSPEC 1.017 01/11/2012 0208   PHURINE 6.0 06/07/2017 2000   GLUCOSEU NEGATIVE 06/07/2017 2000   GLUCOSEU Negative 01/11/2012 0208   HGBUR SMALL (A) 06/07/2017 2000   BILIRUBINUR NEGATIVE 06/07/2017 2000   BILIRUBINUR Negative 04/14/2017 1505   BILIRUBINUR Negative 01/11/2012 0208   KETONESUR NEGATIVE 06/07/2017 2000   PROTEINUR NEGATIVE 06/07/2017 2000    UROBILINOGEN negative 10/08/2015 1009   NITRITE POSITIVE (A) 06/07/2017 2000   LEUKOCYTESUR MODERATE (A) 06/07/2017 2000   LEUKOCYTESUR 1+ (A) 04/14/2017 1505   LEUKOCYTESUR 3+ 01/11/2012 0208    Assessment & Plan:   1.  Urinary retention Patient doing well.  Plan for scheduled upsizing of suprapubic catheter with IR.  Will send culture to help IR to chose periprocedural antibiotics.  I do not think he needs a long-term course of antibiotics at this point as he is chronically colonized and is asymptomatic.  Nickie Retort, MD  [facility] [address], [place], [place] [address] [phone]

## 2017-06-28 ENCOUNTER — Ambulatory Visit: Payer: Self-pay | Admitting: Podiatry

## 2017-06-29 DIAGNOSIS — N319 Neuromuscular dysfunction of bladder, unspecified: Secondary | ICD-10-CM | POA: Diagnosis not present

## 2017-06-29 DIAGNOSIS — Z9350 Unspecified cystostomy status: Secondary | ICD-10-CM | POA: Diagnosis not present

## 2017-06-29 DIAGNOSIS — L89322 Pressure ulcer of left buttock, stage 2: Secondary | ICD-10-CM | POA: Diagnosis not present

## 2017-06-29 DIAGNOSIS — L89312 Pressure ulcer of right buttock, stage 2: Secondary | ICD-10-CM | POA: Diagnosis not present

## 2017-06-29 DIAGNOSIS — G809 Cerebral palsy, unspecified: Secondary | ICD-10-CM | POA: Diagnosis not present

## 2017-06-29 DIAGNOSIS — S90221D Contusion of right lesser toe(s) with damage to nail, subsequent encounter: Secondary | ICD-10-CM | POA: Diagnosis not present

## 2017-06-29 LAB — CULTURE, URINE COMPREHENSIVE

## 2017-07-02 ENCOUNTER — Ambulatory Visit: Payer: Medicare Other

## 2017-07-07 ENCOUNTER — Other Ambulatory Visit (HOSPITAL_COMMUNITY): Payer: Self-pay | Admitting: Diagnostic Radiology

## 2017-07-07 ENCOUNTER — Ambulatory Visit
Admission: RE | Admit: 2017-07-07 | Discharge: 2017-07-07 | Disposition: A | Discharge status: Home / Self Care | Payer: Medicare Other | Source: Ambulatory Visit | Attending: Urology | Admitting: Urology

## 2017-07-07 ENCOUNTER — Other Ambulatory Visit: Payer: Medicare Other

## 2017-07-07 ENCOUNTER — Ambulatory Visit: Admission: RE | Admit: 2017-07-07 | Payer: Medicare Other | Source: Ambulatory Visit

## 2017-07-07 DIAGNOSIS — Z436 Encounter for attention to other artificial openings of urinary tract: Secondary | ICD-10-CM | POA: Diagnosis not present

## 2017-07-07 DIAGNOSIS — N319 Neuromuscular dysfunction of bladder, unspecified: Secondary | ICD-10-CM | POA: Diagnosis not present

## 2017-07-07 DIAGNOSIS — R339 Retention of urine, unspecified: Secondary | ICD-10-CM | POA: Insufficient documentation

## 2017-07-07 DIAGNOSIS — N186 End stage renal disease: Secondary | ICD-10-CM

## 2017-07-07 DIAGNOSIS — Z992 Dependence on renal dialysis: Principal | ICD-10-CM

## 2017-07-07 HISTORY — PX: IR CATHETER TUBE CHANGE: IMG717

## 2017-07-07 MED ORDER — IOPAMIDOL (ISOVUE-300) INJECTION 61%
10.0000 mL | Freq: Once | INTRAVENOUS | Status: AC | PRN
  Administered 2017-07-07: 10 mL via INTRAVENOUS

## 2017-07-07 MED ORDER — LIDOCAINE HCL (PF) 1 % IJ SOLN
INTRAMUSCULAR | Status: AC
  Filled 2017-07-07: qty 30

## 2017-07-07 NOTE — Procedures (Signed)
Successful exchange and up size of suprapubic catheter.  Patient now has a 16 Fr pigtail drain.  No blood loss and no immediate complication.

## 2017-07-08 ENCOUNTER — Other Ambulatory Visit: Payer: Self-pay | Admitting: Radiology

## 2017-07-08 DIAGNOSIS — Z9350 Unspecified cystostomy status: Secondary | ICD-10-CM | POA: Diagnosis not present

## 2017-07-08 DIAGNOSIS — L89312 Pressure ulcer of right buttock, stage 2: Secondary | ICD-10-CM | POA: Diagnosis not present

## 2017-07-08 DIAGNOSIS — S90221D Contusion of right lesser toe(s) with damage to nail, subsequent encounter: Secondary | ICD-10-CM | POA: Diagnosis not present

## 2017-07-08 DIAGNOSIS — L89322 Pressure ulcer of left buttock, stage 2: Secondary | ICD-10-CM | POA: Diagnosis not present

## 2017-07-08 DIAGNOSIS — G809 Cerebral palsy, unspecified: Secondary | ICD-10-CM | POA: Diagnosis not present

## 2017-07-08 DIAGNOSIS — N319 Neuromuscular dysfunction of bladder, unspecified: Secondary | ICD-10-CM | POA: Diagnosis not present

## 2017-07-13 DIAGNOSIS — G809 Cerebral palsy, unspecified: Secondary | ICD-10-CM | POA: Diagnosis not present

## 2017-07-13 DIAGNOSIS — N319 Neuromuscular dysfunction of bladder, unspecified: Secondary | ICD-10-CM | POA: Diagnosis not present

## 2017-07-13 DIAGNOSIS — L89312 Pressure ulcer of right buttock, stage 2: Secondary | ICD-10-CM | POA: Diagnosis not present

## 2017-07-13 DIAGNOSIS — L89322 Pressure ulcer of left buttock, stage 2: Secondary | ICD-10-CM | POA: Diagnosis not present

## 2017-07-13 DIAGNOSIS — S90221D Contusion of right lesser toe(s) with damage to nail, subsequent encounter: Secondary | ICD-10-CM | POA: Diagnosis not present

## 2017-07-13 DIAGNOSIS — Z9350 Unspecified cystostomy status: Secondary | ICD-10-CM | POA: Diagnosis not present

## 2017-07-19 ENCOUNTER — Telehealth: Payer: Self-pay

## 2017-07-19 NOTE — Telephone Encounter (Signed)
Pt sister called stating pt mother had been c/o blood in pt SPT bag. Reinforced with sister pt should increase fluid in take. Sister voiced understanding.

## 2017-07-21 ENCOUNTER — Telehealth: Payer: Self-pay | Admitting: Family Medicine

## 2017-07-21 NOTE — Telephone Encounter (Signed)
Copied from [place]. Topic: Quick Communication - See Telephone Encounter >> Jul 21, 2017  2:20 PM Synthia Innocent wrote: CRM for notification. See Telephone encounter for: Needing a letter for Adult Protective services for Guardianship hearing. Need letter stating his health, regarding needs for care. Fax # [phone]  07/21/17.

## 2017-07-21 NOTE — Telephone Encounter (Signed)
This is a former Dr. Manuella Ghazi pt are you able to do this?

## 2017-07-22 ENCOUNTER — Ambulatory Visit (INDEPENDENT_AMBULATORY_CARE_PROVIDER_SITE_OTHER): Payer: Medicare Other | Admitting: Podiatry

## 2017-07-22 ENCOUNTER — Encounter: Payer: Self-pay | Admitting: Podiatry

## 2017-07-22 VITALS — BP 154/97 | HR 90

## 2017-07-22 DIAGNOSIS — T148XXA Other injury of unspecified body region, initial encounter: Secondary | ICD-10-CM | POA: Diagnosis not present

## 2017-07-22 DIAGNOSIS — W450XXA Nail entering through skin, initial encounter: Secondary | ICD-10-CM | POA: Diagnosis not present

## 2017-07-22 NOTE — Progress Notes (Signed)
   Subjective:    Patient ID: Gerald Montgomery, male    DOB: Oct 29, 1955, 62 y.o.   MRN: [id number]  HPI this patient presents to the office with chief complaint of a deformed painful big toenail, right foot.  This patient states that his wheelchair and wheeled into the shower where his big toenail was injured.  He says this injury happened 6-7 months ago. He says that the toenail is now painful and he presents the office today for an evaluation of this nail.  He says he was referred to this office by his PCP.  He also was taking a Xarelto. He has CP. He presents the office today for an evaluation and treatment of this painful big toenail, right foot.    Review of Systems  All other systems reviewed and are negative.      Objective:   Physical Exam General Appearance  Alert, conversant and in no acute stress.  Vascular  Deferred  Neurologic  Deferred  Nails Thick disfigured discolored nails with subungual debris right hallux.  Dried hematoma noted right hallux toenail.No evidence of bacterial infection or drainage bilaterally.  Orthopedic  No limitations of motion of motion feet bilaterally.  No crepitus or effusions noted.  No bony pathology or digital deformities noted.  Skin  normotropic skin with no porokeratosis noted bilaterally.  No signs of infections or ulcers noted.          Assessment & Plan:  Hematoma secondary nail injury  IE  Nail plate was removed and the hematoma was removed .  No evidence of infection noted.  Told to peroxide nail bed at home as needed.  DSD applied. RTC prn   Gardiner Barefoot DPM

## 2017-07-22 NOTE — Telephone Encounter (Signed)
Left detailed voicemail

## 2017-07-22 NOTE — Telephone Encounter (Signed)
I would like to see the patient for this Thank you

## 2017-07-28 DIAGNOSIS — L89322 Pressure ulcer of left buttock, stage 2: Secondary | ICD-10-CM | POA: Diagnosis not present

## 2017-07-28 DIAGNOSIS — S90221D Contusion of right lesser toe(s) with damage to nail, subsequent encounter: Secondary | ICD-10-CM | POA: Diagnosis not present

## 2017-07-28 DIAGNOSIS — L89312 Pressure ulcer of right buttock, stage 2: Secondary | ICD-10-CM | POA: Diagnosis not present

## 2017-07-28 DIAGNOSIS — N319 Neuromuscular dysfunction of bladder, unspecified: Secondary | ICD-10-CM | POA: Diagnosis not present

## 2017-07-28 DIAGNOSIS — G809 Cerebral palsy, unspecified: Secondary | ICD-10-CM | POA: Diagnosis not present

## 2017-07-28 DIAGNOSIS — Z9350 Unspecified cystostomy status: Secondary | ICD-10-CM | POA: Diagnosis not present

## 2017-08-03 ENCOUNTER — Telehealth: Payer: Self-pay | Admitting: Family Medicine

## 2017-08-03 ENCOUNTER — Ambulatory Visit (INDEPENDENT_AMBULATORY_CARE_PROVIDER_SITE_OTHER): Payer: Medicare Other | Admitting: Family Medicine

## 2017-08-03 ENCOUNTER — Encounter: Payer: Self-pay | Admitting: Family Medicine

## 2017-08-03 VITALS — BP 122/82 | HR 87 | Temp 98.0°F | Resp 14

## 2017-08-03 DIAGNOSIS — G809 Cerebral palsy, unspecified: Secondary | ICD-10-CM

## 2017-08-03 DIAGNOSIS — N319 Neuromuscular dysfunction of bladder, unspecified: Secondary | ICD-10-CM | POA: Diagnosis not present

## 2017-08-03 DIAGNOSIS — D509 Iron deficiency anemia, unspecified: Secondary | ICD-10-CM | POA: Diagnosis not present

## 2017-08-03 DIAGNOSIS — Z5181 Encounter for therapeutic drug level monitoring: Secondary | ICD-10-CM | POA: Diagnosis not present

## 2017-08-03 DIAGNOSIS — F39 Unspecified mood [affective] disorder: Secondary | ICD-10-CM | POA: Diagnosis not present

## 2017-08-03 DIAGNOSIS — L89151 Pressure ulcer of sacral region, stage 1: Secondary | ICD-10-CM | POA: Diagnosis not present

## 2017-08-03 DIAGNOSIS — Z9359 Other cystostomy status: Secondary | ICD-10-CM | POA: Diagnosis not present

## 2017-08-03 DIAGNOSIS — Z86718 Personal history of other venous thrombosis and embolism: Secondary | ICD-10-CM | POA: Diagnosis not present

## 2017-08-03 DIAGNOSIS — D485 Neoplasm of uncertain behavior of skin: Secondary | ICD-10-CM

## 2017-08-03 DIAGNOSIS — K219 Gastro-esophageal reflux disease without esophagitis: Secondary | ICD-10-CM | POA: Diagnosis not present

## 2017-08-03 DIAGNOSIS — L8991 Pressure ulcer of unspecified site, stage 1: Secondary | ICD-10-CM | POA: Insufficient documentation

## 2017-08-03 NOTE — Assessment & Plan Note (Signed)
Unsure of the diagnosis; previously seen by psychiatrist; stable on atypical antipsychotic; will continue

## 2017-08-03 NOTE — Assessment & Plan Note (Signed)
Check labs today.

## 2017-08-03 NOTE — Assessment & Plan Note (Signed)
Continue gel mattress overlay; can use alternating pressure pad if needed

## 2017-08-03 NOTE — Patient Instructions (Addendum)
Try an egg crate for the wheelchair. Please call urology to discuss proper care for the suprapubic catheter If that spot on his right foot does not completely go away in one week, let us know so we can send him to a dermatologist (skin doctor). We will draw some blood work today and investigate his blood thinner. You should always be informed about you labs, so if you do not get a phone call in one week, please call us.   Preventing Pressure Injuries WHAT IS A PRESSURE INJURY? A pressure injury, previously called a bedsore or a pressure ulcer, is an injury to the skin and underlying tissue caused by pressure. A pressure injury can happen when your skin presses against a surface, such as a mattress or wheelchair seat, for too long. The pressure on the blood vessels causes reduced blood flow to your skin. This can eventually cause the skin tissue to die and break down into a wound. Pressure injuries usually develop:  Over bony parts of the body, such as the tailbone, shoulders, elbows, hips, and heels.  Under medical devices, such as respiratory equipment, stockings, tubes, and splints.  They can cause pain, muscle damage, and infection. HOW DO PRESSURE INJURIES HAPPEN? Pressure injuries are caused by a lack of blood supply to an area of skin. These injuries begin as a reddened area on the skin and can become an open sore. They can result from intense pressure over a short period of time or from less pressure over a long period of time. Pressure injuries can vary in severity. This condition is more likely to develop in people who:  Are in the hospital or an extended care facility.  Are bedridden or in a wheelchair.  Have an injury or disease that keeps them from: ? Moving normally. ? Feeling pain or pressure. ? Communicating if they feel pain or pressure.  Have a condition that: ? Makes them sleepy or less alert. ? Causes poor blood flow.  Need to wear a medical device.  Have poor  control of their bladder or bowel functions (incontinence).  Have poor nutrition (malnutrition).  Have had this condition before.  Are of certain ethnicities. People of African American and Latino or Hispanic descent are at higher risk compared to other ethnic groups.  HOW CAN I PREVENT PRESSURE INJURIES? Skin Care  Keep your skin clean and dry. Gently pat your skin dry.  Do not rub or massage boney areas of your skin.  Moisturize dry skin.  Use gentle cleansers and skin protectants routinely if you are incontinent.  Check your skin every day for any changes in color and for any new blisters or sores. Make sure to check under and around any medical devices and between skin folds. Have a caregiver do this for you if you are not able. Reducing and Redistributing Pressure  Do not lie or sit in one position for a long time. Move or change position every two hours, or as told by your health care provider.  Use pillows or cushions to redistribute pressure. Ask your health care provider to recommend cushions or pads for you.  Use medical devices that to not rub your skin. Tell your health care provider if one of your medical devices is causing pain or irritation. Medicines  Take over-the-counter and prescription medicines only as told by your health care provider.  If you were prescribed an antibiotic medicine, take it or apply it as told by your health care provider. Do not stop  taking or using the antibiotic even if your condition improves. General Instructions  Be as active as you can every day. Ask your health care provider to suggest safe exercises or activities.  Work with your health care provider to manage any chronic health conditions.  Eat a healthy diet that includes lots of protein. Ask your health care provider for diet advice.  Drink enough fluid to keep your urine clear or pale yellow.  Do not abuse drugs or alcohol.  Do not smoke.  Keep all follow-up visits as  told by your health care provider. This is important. WHAT STEPS WILL BE TAKEN TO PREVENT PRESSURE INJURIES IF I AM IN THE HOSPITAL? Your health care providers:  Will inspect your skin at least daily. Skin under or around medical devices should be checked at least twice a day while you are in the hospital.  May recommend that you use certain types of bedding to help prevent them. These may include a pad, mattress, or chair cushion that is filled with gel, air, water, or foam.  Will evaluate your nutrition and consult a diet specialist (dietician), if needed.  Will inspect and change any wound dressings regularly.  May help you move into different positions every few hours.  Will adjust any medical devices and braces as needed to limit pressure on your skin.  Will keep your skin clean and dry.  May use gentle cleansers and skin protectants, if you are incontinent.  Will moisturize any dry skin.  Make sure that you let your health care provider know if you feel or see any changes in your skin. This information is not intended to replace advice given to you by your health care provider. Make sure you discuss any questions you have with your health care provider. Document Released: 06/18/2004 Document Revised: 10/17/2015 Document Reviewed: 02/14/2015 Elsevier Interactive Patient Education  [employer].

## 2017-08-03 NOTE — Assessment & Plan Note (Addendum)
Requires 24/7 care; home health, family supportive

## 2017-08-03 NOTE — Assessment & Plan Note (Signed)
With suprapubic catheter; seeing urologist

## 2017-08-03 NOTE — Assessment & Plan Note (Signed)
On continued Factor Xa inhibitor; will check records to see why he remains on a blood thinner for years; will look to see if other clotting issues or other notes from his previous primary physicians

## 2017-08-03 NOTE — Telephone Encounter (Signed)
Review chart to see why he is still on Xarelto and at this dose

## 2017-08-03 NOTE — Assessment & Plan Note (Signed)
Using PPI just PRN

## 2017-08-03 NOTE — Progress Notes (Signed)
BP 122/82   Pulse 87   Temp 98 F (36.7 C) (Oral)   Resp 14   SpO2 98%    Subjective:    Patient ID: Gerald Montgomery, male    DOB: May 07, 1956, 62 y.o.   MRN: [id number]  HPI: Gerald Montgomery is a 62 y.o. male  Chief Complaint  Patient presents with  . Follow-up    HPI Patient is new to me; previous provider left our practice  Legal Guardian: Mother Gerald Montgomery)- He lives with mother; had recent stroke. Gerald Montgomery (sister) here as well and helps care for him, lives near by.   Suprapubic Catheter: placed January of this year 90 Gauge. Has appointment on 3/19 with urology for first catheter change. No signs of infection, no redness, drainage. Kept clean. They were instructed to call urologist  DVT: patient still taking xarelto; no leg swelling; stays in wheel chair or gel mattress. DVT was apparently years ago; no hx of PE; DVT was not multiple, no other clots or recurrences; he has not see a hematologist for this  Pressure Ulcer: Sister and niece help with dressing changes as well as home health nurse. Use "butt paste". Sister states it's better than it has been but healing has plateaued. It was more open at one point, now just redness; only spends a few hours in the wheelchair and has gel mattress in the bed  GERD: takes prilosec PRN. Will have lots of burning and patient endorses burning in his throat when he needs it. Maybe takes it once or twice a month.   Mood Disorder: Started by Dr. Rosine Montgomery and abilify dose has been stable for years. Patient and family note great control of behavioral symptoms.   Anemia, microcytic hypochromic indices ntoed   US venous imaging IMPRESSION:   1.  No sonographic evidence of a deep venous thrombus within the right  lower  extremity.  2. Nonocclusive thrombus is once again appreciated within the superficial  femoral vein.      Depression screen [facility] 2/9 08/03/2017 05/31/2017 03/25/2017 01/05/2017 07/31/2016  Decreased Interest  0 0 0 0 0  Down, Depressed, Hopeless 0 0 0 0 0  PHQ - 2 Score 0 0 0 0 0  Altered sleeping - - - - -  Tired, decreased energy - - - - -  Change in appetite - - - - -  Feeling bad or failure about yourself  - - - - -  Trouble concentrating - - - - -  Moving slowly or fidgety/restless - - - - -  Suicidal thoughts - - - - -  PHQ-9 Score - - - - -  Difficult doing work/chores - - - - -    Relevant past medical, surgical, family and social history reviewed Past Medical History:  Diagnosis Date  . Cerebral palsy ([place])   . Depression   . History of DVT of lower extremity   . Mood disorder ([place])   . Neurogenic bladder   . Urinary incontinence    Past Surgical History:  Procedure Laterality Date  . ESOPHAGOGASTRODUODENOSCOPY Left 02/07/2017   Procedure: ESOPHAGOGASTRODUODENOSCOPY (EGD);  Surgeon: Thornton Park, MD;  Location: [facility];  Service: Gastroenterology;  Laterality: Left;  . filter surgery for blood clot  2013  . HIP FRACTURE SURGERY  2013  . IR CATHETER TUBE CHANGE  07/07/2017   Family History  Problem Relation Age of Onset  . Heart disease Mother   . Atrial fibrillation Mother   .  Diabetes Mother        Borderline  . Heart disease Father        Passed away of heart attack  . Kidney disease Neg Hx   . Prostate cancer Neg Hx    Social History   Tobacco Use  . Smoking status: Never Smoker  . Smokeless tobacco: Never Used  Substance Use Topics  . Alcohol use: No    Alcohol/week: 0.0 oz  . Drug use: No    Interim medical history since last visit reviewed. Allergies and medications reviewed  Review of Systems Per HPI unless specifically indicated above     Objective:    BP 122/82   Pulse 87   Temp 98 F (36.7 C) (Oral)   Resp 14   SpO2 98%   Wt Readings from Last 3 Encounters:  07/07/17 125 lb (56.7 kg)  06/25/17 125 lb (56.7 kg)  06/07/17 126 lb (57.2 kg)    Physical Exam  Constitutional: Vital signs are normal.  HENT:  Head:  Normocephalic and atraumatic.  Nose: Nose normal.  Mouth/Throat: Oropharynx is clear and moist.  Eyes: Conjunctivae are normal.  Neck: No thyroid mass present.  Cardiovascular: Normal rate, regular rhythm and normal pulses.  Pulmonary/Chest: Effort normal and breath sounds normal.  Abdominal: Soft. There is no tenderness (suprapubic catheter, covered with clean dressing).  Neurological: He is alert. GCS eye subscore is 4. GCS verbal subscore is 5. GCS motor subscore is 6.  Skin: Skin is warm, dry and intact. Lesion noted.     Mild erythema over the upper LEFT buttock; no ulceration, no skin breakdown  Psychiatric: He has a normal mood and affect. His speech is normal and behavior is normal. Thought content normal.    Results for orders placed or performed in visit on 06/25/17  CULTURE, URINE COMPREHENSIVE  Result Value Ref Range   Urine Culture, Comprehensive Final report    Organism ID, Bacteria Comment       Assessment & Plan:   Problem List Items Addressed This Visit      Digestive   GERD (gastroesophageal reflux disease)    Using PPI just PRN        Nervous and Auditory   Cerebral palsy ([place]) - Primary    Requires 24/7 care; home health, family supportive        Musculoskeletal and Integument   Stage 1 decubitus ulcer    Continue gel mattress overlay; can use alternating pressure pad if needed        Other   Neurogenic bladder    With suprapubic catheter; seeing urologist      Mood disorder ([place])    Unsure of the diagnosis; previously seen by psychiatrist; stable on atypical antipsychotic; will continue      Hypochromic microcytic anemia    Check labs today      Relevant Orders   CBC with Differential/Platelet   Fe+TIBC+Fer   History of DVT of lower extremity    On continued Factor Xa inhibitor; will check records to see why he remains on a blood thinner for years; will look to see if other clotting issues or other notes from his previous primary  physicians       Other Visit Diagnoses    Medication monitoring encounter       Relevant Orders   Basic metabolic panel   Suprapubic catheter [facility])       follow-up with urologist   Neoplasm of uncertain behavior of skin  watch area on the dorsum RIGHT foot; if not resolved in one week, family to call and we'll refer to derm       Follow up plan: No Follow-up on file.  An after-visit summary was printed and given to the patient at [facility].  Please see the patient instructions which may contain other information and recommendations beyond what is mentioned above in the assessment and plan.  No orders of the defined types were placed in this encounter.   Orders Placed This Encounter  Procedures  . CBC with Differential/Platelet  . Fe+TIBC+Fer  . Basic metabolic panel

## 2017-08-04 LAB — CBC WITH DIFFERENTIAL/PLATELET
BASOS ABS: 42 {cells}/uL (ref 0–200)
Basophils Relative: 0.9 %
EOS ABS: 99 {cells}/uL (ref 15–500)
Eosinophils Relative: 2.1 %
HEMATOCRIT: 35.9 % — AB (ref 38.5–50.0)
Hemoglobin: 11.2 g/dL — ABNORMAL LOW (ref 13.2–17.1)
LYMPHS ABS: 1222 {cells}/uL (ref 850–3900)
MCH: 23.3 pg — AB (ref 27.0–33.0)
MCHC: 31.2 g/dL — AB (ref 32.0–36.0)
MCV: 74.6 fL — AB (ref 80.0–100.0)
MPV: 11.9 fL (ref 7.5–12.5)
Monocytes Relative: 7 %
Neutro Abs: 3008 cells/uL (ref 1500–7800)
Neutrophils Relative %: 64 %
Platelets: 198 10*3/uL (ref 140–400)
RBC: 4.81 10*6/uL (ref 4.20–5.80)
RDW: 15.9 % — AB (ref 11.0–15.0)
Total Lymphocyte: 26 %
WBC: 4.7 10*3/uL (ref 3.8–10.8)
WBCMIX: 329 {cells}/uL (ref 200–950)

## 2017-08-04 LAB — BASIC METABOLIC PANEL
BUN/Creatinine Ratio: 23 (calc) — ABNORMAL HIGH (ref 6–22)
BUN: 13 mg/dL (ref 7–25)
CO2: 21 mmol/L (ref 20–32)
CREATININE: 0.57 mg/dL — AB (ref 0.70–1.25)
Calcium: 9 mg/dL (ref 8.6–10.3)
Chloride: 107 mmol/L (ref 98–110)
Glucose, Bld: 89 mg/dL (ref 65–99)
POTASSIUM: 4.1 mmol/L (ref 3.5–5.3)
SODIUM: 141 mmol/L (ref 135–146)

## 2017-08-04 LAB — IRON,TIBC AND FERRITIN PANEL
%SAT: 6 % (calc) — ABNORMAL LOW (ref 15–60)
Ferritin: 6 ng/mL — ABNORMAL LOW (ref 20–380)
Iron: 29 ug/dL — ABNORMAL LOW (ref 50–180)
TIBC: 489 mcg/dL (calc) — ABNORMAL HIGH (ref 250–425)

## 2017-08-05 ENCOUNTER — Ambulatory Visit: Payer: Medicare Other | Admitting: Urology

## 2017-08-09 ENCOUNTER — Other Ambulatory Visit: Payer: Self-pay | Admitting: Family Medicine

## 2017-08-09 DIAGNOSIS — D509 Iron deficiency anemia, unspecified: Secondary | ICD-10-CM

## 2017-08-09 DIAGNOSIS — Z86718 Personal history of other venous thrombosis and embolism: Secondary | ICD-10-CM

## 2017-08-09 NOTE — Progress Notes (Signed)
   08/10/2017 10:53 AM   Gerald Montgomery 03-24-1956 [id number]  Referring provider: Roselee Nova, MD [address] [place], [place] [address]  No chief complaint on file.   HPI: Patient is a 62 year old Caucasian male with cerebral palsy who underwent placement of an SPT who presents today to have his first monthly exchange with his mother and sister.    He completed his placement of a SPT on 07/07/2017.    Today, he has no complaints.    Patient denies any gross hematuria, dysuria or suprapubic/flank pain.  Patient denies any fevers, chills, nausea or vomiting.   Please see office note dated 04/30/2017 for full urological history.   Physical Exam: BP 139/81   Pulse 97   Resp 16   SpO2 98%   Constitutional: Well nourished. Alert and oriented, No acute distress. HEENT: [place] AT, moist mucus membranes. Trachea midline, no masses. Cardiovascular: No clubbing, cyanosis, or edema. Respiratory: Normal respiratory effort, no increased work of breathing. GI: Abdomen is soft, non tender, non distended, no abdominal masses. Liver and spleen not palpable.  No hernias appreciated.  Stool sample for occult testing is not indicated.  SPT site is clean and dry.  An area of irritation and skin breakdown (1.5 cm x 1 cm) is noted on the upper left of SPT site due to adhesive.   GU: No CVA tenderness.  No bladder fullness or masses.   Skin: No rashes, bruises or suspicious lesions. Lymph: No cervical or inguinal adenopathy. Neurologic: Grossly intact, no focal deficits, moving all 4 extremities. Psychiatric: Normal mood and affect.  Procedure Suprapubic Cath Change Patient is present today for a suprapubic catheter change due to urinary retention.  A 16 French pigtail drain is removed from the tract with out difficulty.  Site was cleaned and prepped in a sterile fashion with betadine.  A 16 FR foley cath was replaced into the tract no complications were noted. Urine return was  noted, 10 ml of sterile water was inflated into the balloon and a leg bag was attached for drainage.  Patient tolerated well. A night bag was given to patient and proper instruction was given on how to switch bags.    Preformed by: Zara Council, PA-C  Assessment & Plan:   1. Urinary retention SPT exchanged without difficulty SPT will be exchanged monthly by home health RTC in 3 months with a provider and SPT exchange   Zara Council, [facility] [address] [place], [place] [address] [phone]

## 2017-08-09 NOTE — Progress Notes (Signed)
Ordered heme referral; start iron

## 2017-08-10 ENCOUNTER — Telehealth: Payer: Self-pay

## 2017-08-10 ENCOUNTER — Ambulatory Visit (INDEPENDENT_AMBULATORY_CARE_PROVIDER_SITE_OTHER): Payer: Medicare Other | Admitting: Urology

## 2017-08-10 ENCOUNTER — Encounter: Payer: Self-pay | Admitting: Urology

## 2017-08-10 ENCOUNTER — Telehealth: Payer: Self-pay | Admitting: Urology

## 2017-08-10 VITALS — BP 139/81 | HR 97 | Resp 16

## 2017-08-10 DIAGNOSIS — Z435 Encounter for attention to cystostomy: Secondary | ICD-10-CM

## 2017-08-10 DIAGNOSIS — R339 Retention of urine, unspecified: Secondary | ICD-10-CM

## 2017-08-10 NOTE — Telephone Encounter (Signed)
Cannot find anything about why he is on chronic anticoagulation for a single DVT Will refer to heme

## 2017-08-10 NOTE — Telephone Encounter (Signed)
-----   Message from Arnetha Courser, MD sent at 08/09/2017  5:48 PM EDT ----- Patient has had long-standing anemia; I cannot find clarification in the chart regarding length of time for him to stay on Xarelto, but I think a consultation with a hematologist can address both of these issues; his anemia is certainly better than it was in  December; have him start ferrous sulfate 324 or 325 mg daily (the bottle might also say 65 mg of elemental iron, same thing); I'll put in the referral to hematologist; thank you

## 2017-08-10 NOTE — Telephone Encounter (Signed)
Would you set up home health to exchange his SPT?

## 2017-08-10 NOTE — Telephone Encounter (Signed)
Called pt's care giver, informed her of lab results. Verbal understanding given.

## 2017-08-12 NOTE — Telephone Encounter (Signed)
Got [facility] started.

## 2017-08-16 ENCOUNTER — Other Ambulatory Visit: Payer: Self-pay

## 2017-08-16 DIAGNOSIS — F39 Unspecified mood [affective] disorder: Secondary | ICD-10-CM

## 2017-08-17 DIAGNOSIS — N319 Neuromuscular dysfunction of bladder, unspecified: Secondary | ICD-10-CM | POA: Diagnosis not present

## 2017-08-17 DIAGNOSIS — Z435 Encounter for attention to cystostomy: Secondary | ICD-10-CM | POA: Diagnosis not present

## 2017-08-17 DIAGNOSIS — M6281 Muscle weakness (generalized): Secondary | ICD-10-CM | POA: Diagnosis not present

## 2017-08-17 DIAGNOSIS — T83038D Leakage of other indwelling urethral catheter, subsequent encounter: Secondary | ICD-10-CM | POA: Diagnosis not present

## 2017-08-17 DIAGNOSIS — L22 Diaper dermatitis: Secondary | ICD-10-CM | POA: Diagnosis not present

## 2017-08-17 DIAGNOSIS — G809 Cerebral palsy, unspecified: Secondary | ICD-10-CM | POA: Diagnosis not present

## 2017-08-17 MED ORDER — ARIPIPRAZOLE 15 MG PO TABS: ORAL_TABLET | ORAL | 1 refills | Status: DC

## 2017-08-19 DIAGNOSIS — L22 Diaper dermatitis: Secondary | ICD-10-CM | POA: Diagnosis not present

## 2017-08-19 DIAGNOSIS — G809 Cerebral palsy, unspecified: Secondary | ICD-10-CM | POA: Diagnosis not present

## 2017-08-19 DIAGNOSIS — M6281 Muscle weakness (generalized): Secondary | ICD-10-CM | POA: Diagnosis not present

## 2017-08-19 DIAGNOSIS — Z435 Encounter for attention to cystostomy: Secondary | ICD-10-CM | POA: Diagnosis not present

## 2017-08-19 DIAGNOSIS — T83038D Leakage of other indwelling urethral catheter, subsequent encounter: Secondary | ICD-10-CM | POA: Diagnosis not present

## 2017-08-19 DIAGNOSIS — N319 Neuromuscular dysfunction of bladder, unspecified: Secondary | ICD-10-CM | POA: Diagnosis not present

## 2017-08-20 ENCOUNTER — Telehealth: Payer: Self-pay | Admitting: Urology

## 2017-08-20 DIAGNOSIS — Z435 Encounter for attention to cystostomy: Secondary | ICD-10-CM | POA: Diagnosis not present

## 2017-08-20 DIAGNOSIS — L22 Diaper dermatitis: Secondary | ICD-10-CM | POA: Diagnosis not present

## 2017-08-20 DIAGNOSIS — T83038D Leakage of other indwelling urethral catheter, subsequent encounter: Secondary | ICD-10-CM | POA: Diagnosis not present

## 2017-08-20 DIAGNOSIS — N319 Neuromuscular dysfunction of bladder, unspecified: Secondary | ICD-10-CM | POA: Diagnosis not present

## 2017-08-20 DIAGNOSIS — M6281 Muscle weakness (generalized): Secondary | ICD-10-CM | POA: Diagnosis not present

## 2017-08-20 DIAGNOSIS — G809 Cerebral palsy, unspecified: Secondary | ICD-10-CM | POA: Diagnosis not present

## 2017-08-20 NOTE — Telephone Encounter (Signed)
I got a call from Mickel Baas at [facility] health and she needs a call back about orders for this patient. Can you call her @ [phone]  Thanks,  Sharyn Lull

## 2017-08-24 ENCOUNTER — Encounter: Payer: Self-pay | Admitting: Oncology

## 2017-08-24 ENCOUNTER — Telehealth: Payer: Self-pay | Admitting: *Deleted

## 2017-08-24 ENCOUNTER — Other Ambulatory Visit: Payer: Self-pay | Admitting: *Deleted

## 2017-08-24 ENCOUNTER — Telehealth: Payer: Self-pay

## 2017-08-24 ENCOUNTER — Inpatient Hospital Stay: Payer: Medicare Other | Attending: Oncology | Admitting: Oncology

## 2017-08-24 ENCOUNTER — Inpatient Hospital Stay: Payer: Medicare Other

## 2017-08-24 VITALS — BP 134/87 | HR 112 | Temp 98.7°F | Resp 18 | Ht 70.0 in | Wt <= 1120 oz

## 2017-08-24 DIAGNOSIS — Z79899 Other long term (current) drug therapy: Secondary | ICD-10-CM | POA: Diagnosis not present

## 2017-08-24 DIAGNOSIS — D509 Iron deficiency anemia, unspecified: Secondary | ICD-10-CM

## 2017-08-24 DIAGNOSIS — R718 Other abnormality of red blood cells: Secondary | ICD-10-CM

## 2017-08-24 DIAGNOSIS — T83038D Leakage of other indwelling urethral catheter, subsequent encounter: Secondary | ICD-10-CM | POA: Diagnosis not present

## 2017-08-24 DIAGNOSIS — Z86718 Personal history of other venous thrombosis and embolism: Secondary | ICD-10-CM | POA: Diagnosis not present

## 2017-08-24 DIAGNOSIS — G809 Cerebral palsy, unspecified: Secondary | ICD-10-CM | POA: Diagnosis not present

## 2017-08-24 DIAGNOSIS — L22 Diaper dermatitis: Secondary | ICD-10-CM | POA: Diagnosis not present

## 2017-08-24 DIAGNOSIS — Z435 Encounter for attention to cystostomy: Secondary | ICD-10-CM | POA: Diagnosis not present

## 2017-08-24 DIAGNOSIS — N319 Neuromuscular dysfunction of bladder, unspecified: Secondary | ICD-10-CM | POA: Diagnosis not present

## 2017-08-24 DIAGNOSIS — M6281 Muscle weakness (generalized): Secondary | ICD-10-CM | POA: Diagnosis not present

## 2017-08-24 LAB — COMPREHENSIVE METABOLIC PANEL
ALT: 25 U/L (ref 17–63)
ANION GAP: 10 (ref 5–15)
AST: 22 U/L (ref 15–41)
Albumin: 4.4 g/dL (ref 3.5–5.0)
Alkaline Phosphatase: 86 U/L (ref 38–126)
BUN: 20 mg/dL (ref 6–20)
CO2: 22 mmol/L (ref 22–32)
CREATININE: 0.55 mg/dL — AB (ref 0.61–1.24)
Calcium: 9.1 mg/dL (ref 8.9–10.3)
Chloride: 105 mmol/L (ref 101–111)
Glucose, Bld: 138 mg/dL — ABNORMAL HIGH (ref 65–99)
POTASSIUM: 4 mmol/L (ref 3.5–5.1)
SODIUM: 137 mmol/L (ref 135–145)
Total Bilirubin: 0.7 mg/dL (ref 0.3–1.2)
Total Protein: 7.7 g/dL (ref 6.5–8.1)

## 2017-08-24 LAB — CBC WITH DIFFERENTIAL/PLATELET
Basophils Absolute: 0.1 10*3/uL (ref 0–0.1)
Basophils Relative: 1 %
EOS ABS: 0.1 10*3/uL (ref 0–0.7)
EOS PCT: 2 %
HCT: 39.6 % — ABNORMAL LOW (ref 40.0–52.0)
Hemoglobin: 12.7 g/dL — ABNORMAL LOW (ref 13.0–18.0)
LYMPHS ABS: 1.2 10*3/uL (ref 1.0–3.6)
LYMPHS PCT: 15 %
MCH: 24.5 pg — AB (ref 26.0–34.0)
MCHC: 32.1 g/dL (ref 32.0–36.0)
MCV: 76.5 fL — AB (ref 80.0–100.0)
MONO ABS: 0.5 10*3/uL (ref 0.2–1.0)
Monocytes Relative: 6 %
Neutro Abs: 5.9 10*3/uL (ref 1.4–6.5)
Neutrophils Relative %: 76 %
Platelets: 204 10*3/uL (ref 150–440)
RBC: 5.18 MIL/uL (ref 4.40–5.90)
RDW: 21.6 % — AB (ref 11.5–14.5)
WBC: 7.8 10*3/uL (ref 3.8–10.6)

## 2017-08-24 LAB — VITAMIN B12: VITAMIN B 12: 151 pg/mL — AB (ref 180–914)

## 2017-08-24 LAB — RETICULOCYTES
RBC.: 5.3 MIL/uL (ref 4.40–5.90)
RETIC COUNT ABSOLUTE: 100.7 10*3/uL (ref 19.0–183.0)
RETIC CT PCT: 1.9 % (ref 0.4–3.1)

## 2017-08-24 LAB — IRON AND TIBC
Iron: 32 ug/dL — ABNORMAL LOW (ref 45–182)
Saturation Ratios: 7 % — ABNORMAL LOW (ref 17.9–39.5)
TIBC: 470 ug/dL — ABNORMAL HIGH (ref 250–450)
UIBC: 438 ug/dL

## 2017-08-24 LAB — TSH: TSH: 2.098 u[IU]/mL (ref 0.350–4.500)

## 2017-08-24 LAB — FERRITIN: Ferritin: 13 ng/mL — ABNORMAL LOW (ref 24–336)

## 2017-08-24 LAB — FOLATE: FOLATE: 17.1 ng/mL (ref >5.9)

## 2017-08-24 NOTE — Telephone Encounter (Signed)
Received a call from [place] that patient is taking oral iron

## 2017-08-24 NOTE — Telephone Encounter (Signed)
Spoke with patient sister / Gerald Montgomery about the patient iron. I have inform Ms. Gerald Montgomery to continue to give Gerald Montgomery the iron due to the fact his Iron was low at 32, Normal range 45-182.and for her to keep his infusion appointment for Tuesday. Ms. Gerald Montgomery was understanding and agreeable to the information that I given to her today.

## 2017-08-24 NOTE — Telephone Encounter (Signed)
Spoke with Gerald Montgomery about the iron Gerald Montgomery is taking, I did inform her to continue to give him the Iron with labs today the patient levels are only at 32 Normal range 45-182. I also inform her to keep appointment for infusion on Tuesday. Gerald Montgomery was agreeable and understanding

## 2017-08-25 ENCOUNTER — Telehealth: Payer: Self-pay | Admitting: Family Medicine

## 2017-08-25 ENCOUNTER — Telehealth: Payer: Self-pay

## 2017-08-25 LAB — HAPTOGLOBIN: Haptoglobin: 235 mg/dL — ABNORMAL HIGH (ref 34–200)

## 2017-08-25 NOTE — Telephone Encounter (Signed)
Copied from [place] [id number]. Topic: Inquiry >> Aug 25, 2017  9:08 AM Corie Chiquito, NT wrote: Reason for CRM: Patient sister is calling because she would like to know if Dr.Lada could write an order for her brother to have an inflatable bed cover that they were disuss to keep the press off of her bottom. If she or her nurse could give her a call back about this at [phone]

## 2017-08-25 NOTE — Telephone Encounter (Signed)
Absolutely, please write on Rx pad and send to DME supplier  Alternating pressure pad for mattress, size appropriately Dx: L89.91, G80.9 LON: 99 months Sig: Use every night

## 2017-08-25 NOTE — Telephone Encounter (Signed)
I have called [name] Ms. Gerald Montgomery to inform her that his B-12 level was low (151), Ms. Gerald Montgomery has agreed to start the B-12 oral medication 1,000 mcg daily. At this time she feels that with his fear of going to Dr office that's he would benefit from oral medication.Ms. Gerald Montgomery was agreeable and understanding to start medication today.

## 2017-08-25 NOTE — Telephone Encounter (Signed)
Sister [name] notified it is ready.

## 2017-08-26 ENCOUNTER — Encounter: Payer: Self-pay | Admitting: Oncology

## 2017-08-26 DIAGNOSIS — L22 Diaper dermatitis: Secondary | ICD-10-CM | POA: Diagnosis not present

## 2017-08-26 DIAGNOSIS — G809 Cerebral palsy, unspecified: Secondary | ICD-10-CM | POA: Diagnosis not present

## 2017-08-26 DIAGNOSIS — T83038D Leakage of other indwelling urethral catheter, subsequent encounter: Secondary | ICD-10-CM | POA: Diagnosis not present

## 2017-08-26 DIAGNOSIS — N319 Neuromuscular dysfunction of bladder, unspecified: Secondary | ICD-10-CM | POA: Diagnosis not present

## 2017-08-26 DIAGNOSIS — M6281 Muscle weakness (generalized): Secondary | ICD-10-CM | POA: Diagnosis not present

## 2017-08-26 DIAGNOSIS — Z435 Encounter for attention to cystostomy: Secondary | ICD-10-CM | POA: Diagnosis not present

## 2017-08-26 NOTE — Progress Notes (Signed)
Hematology/Oncology Consult note [facility] Telephone:([phone] [phone]  Patient Care Team: Arnetha Courser, MD as PCP - General (Family Medicine) Nickie Retort, MD as Consulting Physician (Urology)   Name of the patient: Gerald Montgomery  [id number]  06/28/1955    Reason for referral- iron deficiency anemia   Referring physician- Dr. Sanda Klein  Date of visit: 08/26/17   History of presenting illness- patient is a 62 year old male with a prior history of DVT and cerebral palsy who has been referred to [place] for anticoagulation management as well as iron deficiency anemia.  He is here with his legal guardian/mother Gerald Montgomery and his Sister Gerald Montgomery both of who care for him and involved in his healthcare.  He also has a history of chronic suprapubic catheter for which he sees urology.  Patient was ambulating with limitations up until 2012 when he had a fall and fractured his hip and femur.  Following that he has been essentially wheelchair-bound and needs assistance with his ADLs and IADLs   1. DVT: Patient was found to have DVT in the left superficial femoral vein in March 2012 and has been on Xarelto since then.  This DVT was spontaneous in the absence of any trauma or surgery and during that time patient was ambulating somewhat and not wheelchair dependent.  No other prior or subsequent history of DVT or PE.  No family history of DVT or PE.  He is currently tolerating Xarelto well without any significant side effects  2.  Iron deficiency anemia: Patient's baseline hemoglobin is between 11-12 but he has been developing progressive microcytosis since December 2018.  In September 2018 he was admitted to the hospital and his hemoglobin had dropped to 8.7.  At that time he was seen by Dr. Tarri Glenn and underwent EGD in the hospital which showed esophagitis.  He did not undergo a colonoscopy at that time.  Most recent CBC from 08/03/2017 showed white  count of 4.7, H&H of 11.2/35.9 with an MCV of 74.6 and a platelet count of 198.  Iron studies showed low iron of 29, ferritin that was low at 6 and elevated TIBC of 489.  Patient was prescribed oral iron but he has been having hard time tolerating oral iron because of abdominal pain.  Patient's family denies any consistent use of NSAIDs by the patient.  They have not noticed any blood in his stools or dark black melanotic stools.  History obtained today is limited as patient does not verbalize much and much of the history is given by his sister  ECOG PS- 3-4  Pain scale- 0   Review of systems- Review of Systems  Unable to perform ROS: Other  Given history of cerebral palsy And poor verbalization of symptoms  Allergies  Allergen Reactions  . Macrobid [Nitrofurantoin]   . Sulfa Antibiotics Itching    Patient Active Problem List   Diagnosis Date Noted  . Iron deficiency anemia 08/24/2017  . Hypochromic microcytic anemia 08/03/2017  . GERD (gastroesophageal reflux disease) 08/03/2017  . Stage 1 decubitus ulcer 08/03/2017  . Abnormal urine odor 07/16/2016  . Need for home health care 06/17/2016  . Hyperglycemia 03/04/2016  . Screening for lipid disorders 03/04/2016  . Cerumen impaction 02/12/2016  . Cerebral palsy ([place]) 12/05/2015  . Hematuria, gross 10/07/2015  . History of DVT of lower extremity 04/11/2015  . Mood disorder ([place]) 04/11/2015  . Neurogenic bladder 04/11/2015     Past Medical History:  Diagnosis Date  .  Cerebral palsy ([place])   . Depression   . History of DVT of lower extremity   . Mood disorder ([place])   . Neurogenic bladder   . Urinary incontinence      Past Surgical History:  Procedure Laterality Date  . ESOPHAGOGASTRODUODENOSCOPY Left 02/07/2017   Procedure: ESOPHAGOGASTRODUODENOSCOPY (EGD);  Surgeon: Thornton Park, MD;  Location: [place];  Service: Gastroenterology;  Laterality: Left;  . filter surgery for blood clot  2013  . HIP FRACTURE  SURGERY  2013  . IR CATHETER TUBE CHANGE  07/07/2017    Social History   Socioeconomic History  . Marital status: Single    Spouse name: Not on file  . Number of children: Not on file  . Years of education: Not on file  . Highest education level: Not on file  Occupational History  . Not on file  Social Needs  . Financial resource strain: Not on file  . Food insecurity:    Worry: Not on file    Inability: Not on file  . Transportation needs:    Medical: Not on file    Non-medical: Not on file  Tobacco Use  . Smoking status: Never Smoker  . Smokeless tobacco: Never Used  Substance and Sexual Activity  . Alcohol use: No    Alcohol/week: 0.0 oz  . Drug use: No  . Sexual activity: Never  Lifestyle  . Physical activity:    Days per week: Not on file    Minutes per session: Not on file  . Stress: Not on file  Relationships  . Social connections:    Talks on phone: Not on file    Gets together: Not on file    Attends religious service: Not on file    Active member of club or organization: Not on file    Attends meetings of clubs or organizations: Not on file    Relationship status: Not on file  . Intimate partner violence:    Fear of current or ex partner: Not on file    Emotionally abused: Not on file    Physically abused: Not on file    Forced sexual activity: Not on file  Other Topics Concern  . Not on file  Social History Narrative  . Not on file     Family History  Problem Relation Age of Onset  . Heart disease Mother   . Atrial fibrillation Mother   . Diabetes Mother        Borderline  . Heart disease Father        Passed away of heart attack  . Kidney disease Neg Hx   . Prostate cancer Neg Hx      Current Outpatient Medications:  .  acetaminophen (TYLENOL) 325 MG tablet, Take 1 tablet (325 mg total) by mouth every 6 (six) hours as needed for mild pain (or Fever >/= 101)., Disp: , Rfl:  .  ARIPiprazole (ABILIFY) 15 MG tablet, TAKE 1 TABLET(15 MG) BY  MOUTH DAILY, Disp: 90 tablet, Rfl: 1 .  XARELTO 10 MG TABS tablet, TAKE 1 TABLET(10 MG) BY MOUTH DAILY, Disp: 90 tablet, Rfl: 0 .  bisacodyl (DULCOLAX) 5 MG EC tablet, Take 1 tablet (5 mg total) by mouth daily as needed for moderate constipation. (Patient not taking: Reported on 08/24/2017), Disp: 30 tablet, Rfl: 0 .  Omeprazole (PRILOSEC PO), Take by mouth as needed., Disp: , Rfl:    Physical exam:  Vitals:   08/24/17 1113 08/24/17 1126  BP: 134/87  Pulse: (!) 112   Resp: 18   Temp: 98.7 F (37.1 C)   TempSrc: Tympanic   SpO2: 98%   Weight: 91 lb 12.8 oz (41.6 kg) (!) 0.1 oz (0.003 kg)  Height: 5\' 10"  (1.778 m)    Physical Exam  Constitutional:  Patient is thin and his lower extremities are spastic and cachectic.  He is sitting in a wheelchair and does not appear to be in any acute distress.  HENT:  Head: Normocephalic and atraumatic.  Eyes: Pupils are equal, round, and reactive to light. EOM are normal.  Neck: Normal range of motion.  Cardiovascular: Normal rate, regular rhythm and normal heart sounds.  Pulmonary/Chest: Effort normal and breath sounds normal.  Abdominal: Soft. Bowel sounds are normal.  Suprapubic catheter in place covered with a clean dressing  Neurological: He is alert.  Skin: Skin is warm and dry.       CMP Latest Ref Rng & Units 08/24/2017  Glucose 65 - 99 mg/dL 138(H)  BUN 6 - 20 mg/dL 20  Creatinine 0.61 - 1.24 mg/dL 0.55(L)  Sodium 135 - 145 mmol/L 137  Potassium 3.5 - 5.1 mmol/L 4.0  Chloride 101 - 111 mmol/L 105  CO2 22 - 32 mmol/L 22  Calcium 8.9 - 10.3 mg/dL 9.1  Total Protein 6.5 - 8.1 g/dL 7.7  Total Bilirubin 0.3 - 1.2 mg/dL 0.7  Alkaline Phos 38 - 126 U/L 86  AST 15 - 41 U/L 22  ALT 17 - 63 U/L 25   CBC Latest Ref Rng & Units 08/24/2017  WBC 3.8 - 10.6 K/uL 7.8  Hemoglobin 13.0 - 18.0 g/dL 12.7(L)  Hematocrit 40.0 - 52.0 % 39.6(L)  Platelets 150 - 440 K/uL 204    Assessment and plan- Patient is a 62 y.o. male with following medical  issues:  1 iron deficiency anemia:.  Patient has relatively mild anemia despite ongoing microcytosis and severe iron deficiency.  He has had problems tolerating oral iron and I would therefore recommend giving him 2 doses of Feraheme 510 mg IV weekly.  Discussed risks and benefits of Feraheme with his mother including all but not limited to headaches, leg swelling, possible risk of infusion reaction.  She understands and gives [place] verbal consent to proceed with IV iron.  Today I will be checking CBC along with ferritin and iron studies, B12 and folate, reticulocyte count TSH haptoglobin and CMP.  I will see him back in 2 months time after 2 doses of IV iron.  Patient has not had a colonoscopy in the recent past.  He has only had EGD in September 2018 when he was anemic.I will refer him to [facility] to discuss risks and benefits of complete GI evaluation including colonoscopy plus minus capsule endoscopy and possible EGD given his underlying cerebral palsy and performance status  2. History of DVT In 2012: This was likely unprovoked although patient  did not ambulate much even back in 2012 which is a continued risk factor.  Discussed risks and benefits of continuing versus stopping anticoagulation.  Risk of her recurrent DVT or PE given his present immobility.  Presently his anemia is not severe and I am in favor of continuing his Xarelto alongside giving him IV iron and ongoing GI evaluation.  If  he were to develop any significant GI bleeding or have a significant decrease in his H&H, I will consider stopping his Xarelto at that time.  Patient's family are in understanding of the plan   Thank you  for this kind referral and the opportunity to participate in the care of this patient   Visit Diagnosis 1. Iron deficiency anemia, unspecified iron deficiency anemia type   2. Microcytosis     Dr. Randa Evens, MD, MPH [facility] at [facility] [phone] 08/26/2017 12:08 PM

## 2017-08-27 DIAGNOSIS — L22 Diaper dermatitis: Secondary | ICD-10-CM | POA: Diagnosis not present

## 2017-08-27 DIAGNOSIS — G809 Cerebral palsy, unspecified: Secondary | ICD-10-CM | POA: Diagnosis not present

## 2017-08-27 DIAGNOSIS — M6281 Muscle weakness (generalized): Secondary | ICD-10-CM | POA: Diagnosis not present

## 2017-08-27 DIAGNOSIS — N319 Neuromuscular dysfunction of bladder, unspecified: Secondary | ICD-10-CM | POA: Diagnosis not present

## 2017-08-27 DIAGNOSIS — Z435 Encounter for attention to cystostomy: Secondary | ICD-10-CM | POA: Diagnosis not present

## 2017-08-27 DIAGNOSIS — T83038D Leakage of other indwelling urethral catheter, subsequent encounter: Secondary | ICD-10-CM | POA: Diagnosis not present

## 2017-08-31 ENCOUNTER — Inpatient Hospital Stay: Payer: Medicare Other

## 2017-08-31 VITALS — BP 132/91 | HR 112 | Temp 97.3°F | Resp 20

## 2017-08-31 DIAGNOSIS — Z79899 Other long term (current) drug therapy: Secondary | ICD-10-CM | POA: Diagnosis not present

## 2017-08-31 DIAGNOSIS — R718 Other abnormality of red blood cells: Secondary | ICD-10-CM | POA: Diagnosis not present

## 2017-08-31 DIAGNOSIS — D509 Iron deficiency anemia, unspecified: Secondary | ICD-10-CM | POA: Diagnosis not present

## 2017-08-31 DIAGNOSIS — G809 Cerebral palsy, unspecified: Secondary | ICD-10-CM | POA: Diagnosis not present

## 2017-08-31 DIAGNOSIS — Z86718 Personal history of other venous thrombosis and embolism: Secondary | ICD-10-CM | POA: Diagnosis not present

## 2017-08-31 MED ORDER — SODIUM CHLORIDE 0.9 % IV SOLN
Freq: Once | INTRAVENOUS | Status: AC
  Administered 2017-08-31: 14:00:00 via INTRAVENOUS
  Filled 2017-08-31: qty 1000

## 2017-08-31 MED ORDER — SODIUM CHLORIDE 0.9 % IV SOLN
510.0000 mg | Freq: Once | INTRAVENOUS | Status: AC
  Administered 2017-08-31: 510 mg via INTRAVENOUS
  Filled 2017-08-31: qty 17

## 2017-09-01 DIAGNOSIS — M6281 Muscle weakness (generalized): Secondary | ICD-10-CM | POA: Diagnosis not present

## 2017-09-01 DIAGNOSIS — N319 Neuromuscular dysfunction of bladder, unspecified: Secondary | ICD-10-CM | POA: Diagnosis not present

## 2017-09-01 DIAGNOSIS — Z435 Encounter for attention to cystostomy: Secondary | ICD-10-CM | POA: Diagnosis not present

## 2017-09-01 DIAGNOSIS — G809 Cerebral palsy, unspecified: Secondary | ICD-10-CM | POA: Diagnosis not present

## 2017-09-01 DIAGNOSIS — L22 Diaper dermatitis: Secondary | ICD-10-CM | POA: Diagnosis not present

## 2017-09-01 DIAGNOSIS — T83038D Leakage of other indwelling urethral catheter, subsequent encounter: Secondary | ICD-10-CM | POA: Diagnosis not present

## 2017-09-02 ENCOUNTER — Other Ambulatory Visit: Payer: Self-pay | Admitting: Family Medicine

## 2017-09-02 DIAGNOSIS — F39 Unspecified mood [affective] disorder: Secondary | ICD-10-CM

## 2017-09-02 DIAGNOSIS — T83038D Leakage of other indwelling urethral catheter, subsequent encounter: Secondary | ICD-10-CM | POA: Diagnosis not present

## 2017-09-02 DIAGNOSIS — M6281 Muscle weakness (generalized): Secondary | ICD-10-CM | POA: Diagnosis not present

## 2017-09-02 DIAGNOSIS — Z435 Encounter for attention to cystostomy: Secondary | ICD-10-CM | POA: Diagnosis not present

## 2017-09-02 DIAGNOSIS — G809 Cerebral palsy, unspecified: Secondary | ICD-10-CM | POA: Diagnosis not present

## 2017-09-02 DIAGNOSIS — N319 Neuromuscular dysfunction of bladder, unspecified: Secondary | ICD-10-CM | POA: Diagnosis not present

## 2017-09-02 DIAGNOSIS — L22 Diaper dermatitis: Secondary | ICD-10-CM | POA: Diagnosis not present

## 2017-09-03 DIAGNOSIS — L22 Diaper dermatitis: Secondary | ICD-10-CM | POA: Diagnosis not present

## 2017-09-03 DIAGNOSIS — G809 Cerebral palsy, unspecified: Secondary | ICD-10-CM | POA: Diagnosis not present

## 2017-09-03 DIAGNOSIS — M6281 Muscle weakness (generalized): Secondary | ICD-10-CM | POA: Diagnosis not present

## 2017-09-03 DIAGNOSIS — T83038D Leakage of other indwelling urethral catheter, subsequent encounter: Secondary | ICD-10-CM | POA: Diagnosis not present

## 2017-09-03 DIAGNOSIS — Z435 Encounter for attention to cystostomy: Secondary | ICD-10-CM | POA: Diagnosis not present

## 2017-09-03 DIAGNOSIS — N319 Neuromuscular dysfunction of bladder, unspecified: Secondary | ICD-10-CM | POA: Diagnosis not present

## 2017-09-06 DIAGNOSIS — T83038D Leakage of other indwelling urethral catheter, subsequent encounter: Secondary | ICD-10-CM | POA: Diagnosis not present

## 2017-09-06 DIAGNOSIS — M6281 Muscle weakness (generalized): Secondary | ICD-10-CM | POA: Diagnosis not present

## 2017-09-06 DIAGNOSIS — L22 Diaper dermatitis: Secondary | ICD-10-CM | POA: Diagnosis not present

## 2017-09-06 DIAGNOSIS — G809 Cerebral palsy, unspecified: Secondary | ICD-10-CM | POA: Diagnosis not present

## 2017-09-06 DIAGNOSIS — N319 Neuromuscular dysfunction of bladder, unspecified: Secondary | ICD-10-CM | POA: Diagnosis not present

## 2017-09-06 DIAGNOSIS — Z435 Encounter for attention to cystostomy: Secondary | ICD-10-CM | POA: Diagnosis not present

## 2017-09-07 ENCOUNTER — Inpatient Hospital Stay: Payer: Medicare Other

## 2017-09-07 VITALS — BP 130/83 | HR 114 | Temp 99.3°F | Resp 20

## 2017-09-07 DIAGNOSIS — Z86718 Personal history of other venous thrombosis and embolism: Secondary | ICD-10-CM | POA: Diagnosis not present

## 2017-09-07 DIAGNOSIS — D509 Iron deficiency anemia, unspecified: Secondary | ICD-10-CM

## 2017-09-07 DIAGNOSIS — Z79899 Other long term (current) drug therapy: Secondary | ICD-10-CM | POA: Diagnosis not present

## 2017-09-07 DIAGNOSIS — G809 Cerebral palsy, unspecified: Secondary | ICD-10-CM | POA: Diagnosis not present

## 2017-09-07 DIAGNOSIS — R718 Other abnormality of red blood cells: Secondary | ICD-10-CM | POA: Diagnosis not present

## 2017-09-07 MED ORDER — SODIUM CHLORIDE 0.9 % IV SOLN
Freq: Once | INTRAVENOUS | Status: AC
  Administered 2017-09-07: 14:00:00 via INTRAVENOUS
  Filled 2017-09-07: qty 1000

## 2017-09-07 MED ORDER — SODIUM CHLORIDE 0.9 % IV SOLN
510.0000 mg | Freq: Once | INTRAVENOUS | Status: AC
  Administered 2017-09-07: 510 mg via INTRAVENOUS
  Filled 2017-09-07: qty 17

## 2017-09-09 DIAGNOSIS — G809 Cerebral palsy, unspecified: Secondary | ICD-10-CM | POA: Diagnosis not present

## 2017-09-09 DIAGNOSIS — Z435 Encounter for attention to cystostomy: Secondary | ICD-10-CM | POA: Diagnosis not present

## 2017-09-09 DIAGNOSIS — N319 Neuromuscular dysfunction of bladder, unspecified: Secondary | ICD-10-CM | POA: Diagnosis not present

## 2017-09-09 DIAGNOSIS — L22 Diaper dermatitis: Secondary | ICD-10-CM | POA: Diagnosis not present

## 2017-09-09 DIAGNOSIS — M6281 Muscle weakness (generalized): Secondary | ICD-10-CM | POA: Diagnosis not present

## 2017-09-09 DIAGNOSIS — T83038D Leakage of other indwelling urethral catheter, subsequent encounter: Secondary | ICD-10-CM | POA: Diagnosis not present

## 2017-09-16 DIAGNOSIS — L22 Diaper dermatitis: Secondary | ICD-10-CM | POA: Diagnosis not present

## 2017-09-16 DIAGNOSIS — G809 Cerebral palsy, unspecified: Secondary | ICD-10-CM | POA: Diagnosis not present

## 2017-09-16 DIAGNOSIS — N319 Neuromuscular dysfunction of bladder, unspecified: Secondary | ICD-10-CM | POA: Diagnosis not present

## 2017-09-16 DIAGNOSIS — Z435 Encounter for attention to cystostomy: Secondary | ICD-10-CM | POA: Diagnosis not present

## 2017-09-16 DIAGNOSIS — M6281 Muscle weakness (generalized): Secondary | ICD-10-CM | POA: Diagnosis not present

## 2017-09-16 DIAGNOSIS — T83038D Leakage of other indwelling urethral catheter, subsequent encounter: Secondary | ICD-10-CM | POA: Diagnosis not present

## 2017-09-22 DIAGNOSIS — Z435 Encounter for attention to cystostomy: Secondary | ICD-10-CM | POA: Diagnosis not present

## 2017-09-22 DIAGNOSIS — N319 Neuromuscular dysfunction of bladder, unspecified: Secondary | ICD-10-CM | POA: Diagnosis not present

## 2017-09-22 DIAGNOSIS — G809 Cerebral palsy, unspecified: Secondary | ICD-10-CM | POA: Diagnosis not present

## 2017-09-22 DIAGNOSIS — T83038D Leakage of other indwelling urethral catheter, subsequent encounter: Secondary | ICD-10-CM | POA: Diagnosis not present

## 2017-09-22 DIAGNOSIS — M6281 Muscle weakness (generalized): Secondary | ICD-10-CM | POA: Diagnosis not present

## 2017-09-22 DIAGNOSIS — L22 Diaper dermatitis: Secondary | ICD-10-CM | POA: Diagnosis not present

## 2017-09-29 ENCOUNTER — Ambulatory Visit: Payer: Medicare Other | Admitting: Gastroenterology

## 2017-09-29 DIAGNOSIS — Z435 Encounter for attention to cystostomy: Secondary | ICD-10-CM | POA: Diagnosis not present

## 2017-09-29 DIAGNOSIS — G809 Cerebral palsy, unspecified: Secondary | ICD-10-CM | POA: Diagnosis not present

## 2017-09-29 DIAGNOSIS — M6281 Muscle weakness (generalized): Secondary | ICD-10-CM | POA: Diagnosis not present

## 2017-09-29 DIAGNOSIS — T83038D Leakage of other indwelling urethral catheter, subsequent encounter: Secondary | ICD-10-CM | POA: Diagnosis not present

## 2017-09-29 DIAGNOSIS — L22 Diaper dermatitis: Secondary | ICD-10-CM | POA: Diagnosis not present

## 2017-09-29 DIAGNOSIS — N319 Neuromuscular dysfunction of bladder, unspecified: Secondary | ICD-10-CM | POA: Diagnosis not present

## 2017-10-01 ENCOUNTER — Other Ambulatory Visit: Payer: Self-pay

## 2017-10-01 ENCOUNTER — Ambulatory Visit: Payer: Medicare Other | Admitting: Gastroenterology

## 2017-10-01 ENCOUNTER — Telehealth: Payer: Self-pay | Admitting: Family Medicine

## 2017-10-01 DIAGNOSIS — Z9181 History of falling: Secondary | ICD-10-CM

## 2017-10-01 NOTE — Telephone Encounter (Signed)
Copied from [place] [id number]. Topic: Referral - Request >> Oct 01, 2017  2:32 PM Margot Ables wrote: Reason for CRM: pt sister and guardian is requesting HH PT eval for a wheelchair of some kind to transport him thru the home and into the shower. If it can be used in the shower that will work well. Pt is currently using a wheelchair that belongs to someone else and it doesn't conform to his body so he slides in it.   She is also asking for a "sit and stand" lift. If she has to pick and choose the wheelchair would be more beneficial.

## 2017-10-01 NOTE — Progress Notes (Signed)
Received a message from Dr. Sanda Klein stating family is requesting a shower chair and [facility] PT for possible need for w/c. C3 referral placed today.

## 2017-10-01 NOTE — Telephone Encounter (Signed)
Can you see if you can help them meet their needs? Thank you

## 2017-10-01 NOTE — Telephone Encounter (Signed)
Referral to C3 placed today.

## 2017-10-06 ENCOUNTER — Telehealth: Payer: Self-pay | Admitting: Family Medicine

## 2017-10-06 DIAGNOSIS — T83038D Leakage of other indwelling urethral catheter, subsequent encounter: Secondary | ICD-10-CM | POA: Diagnosis not present

## 2017-10-06 DIAGNOSIS — G809 Cerebral palsy, unspecified: Secondary | ICD-10-CM

## 2017-10-06 DIAGNOSIS — N319 Neuromuscular dysfunction of bladder, unspecified: Secondary | ICD-10-CM | POA: Diagnosis not present

## 2017-10-06 DIAGNOSIS — L22 Diaper dermatitis: Secondary | ICD-10-CM | POA: Diagnosis not present

## 2017-10-06 DIAGNOSIS — Z435 Encounter for attention to cystostomy: Secondary | ICD-10-CM | POA: Diagnosis not present

## 2017-10-06 DIAGNOSIS — M6281 Muscle weakness (generalized): Secondary | ICD-10-CM | POA: Diagnosis not present

## 2017-10-06 NOTE — Telephone Encounter (Signed)
Please contact physical therapist for Gerald Montgomery I received a note from RN and she recommends either a hoyer lift or sit/stand lift Please ask physical therapist which one he/she recommends and we'll write Dx: cerebral palsy, and other diagnoses noted by PT Thank you

## 2017-10-11 NOTE — Telephone Encounter (Signed)
Great, please write out on script pad and I'll be glad to sign Thank you LON 99 months

## 2017-10-11 NOTE — Telephone Encounter (Signed)
PT and patients family recommend the sit and stand lift due to the hoyer lift not fitting in the bedroom.  Fax order to advanced [phone]

## 2017-10-13 DIAGNOSIS — L22 Diaper dermatitis: Secondary | ICD-10-CM | POA: Diagnosis not present

## 2017-10-13 DIAGNOSIS — G809 Cerebral palsy, unspecified: Secondary | ICD-10-CM | POA: Diagnosis not present

## 2017-10-13 DIAGNOSIS — M6281 Muscle weakness (generalized): Secondary | ICD-10-CM | POA: Diagnosis not present

## 2017-10-13 DIAGNOSIS — N319 Neuromuscular dysfunction of bladder, unspecified: Secondary | ICD-10-CM | POA: Diagnosis not present

## 2017-10-13 DIAGNOSIS — T83038D Leakage of other indwelling urethral catheter, subsequent encounter: Secondary | ICD-10-CM | POA: Diagnosis not present

## 2017-10-13 DIAGNOSIS — Z435 Encounter for attention to cystostomy: Secondary | ICD-10-CM | POA: Diagnosis not present

## 2017-10-16 DIAGNOSIS — L22 Diaper dermatitis: Secondary | ICD-10-CM | POA: Diagnosis not present

## 2017-10-16 DIAGNOSIS — G809 Cerebral palsy, unspecified: Secondary | ICD-10-CM | POA: Diagnosis not present

## 2017-10-16 DIAGNOSIS — Z86718 Personal history of other venous thrombosis and embolism: Secondary | ICD-10-CM | POA: Diagnosis not present

## 2017-10-16 DIAGNOSIS — F329 Major depressive disorder, single episode, unspecified: Secondary | ICD-10-CM | POA: Diagnosis not present

## 2017-10-16 DIAGNOSIS — N319 Neuromuscular dysfunction of bladder, unspecified: Secondary | ICD-10-CM | POA: Diagnosis not present

## 2017-10-16 DIAGNOSIS — Z435 Encounter for attention to cystostomy: Secondary | ICD-10-CM | POA: Diagnosis not present

## 2017-10-21 DIAGNOSIS — Z435 Encounter for attention to cystostomy: Secondary | ICD-10-CM | POA: Diagnosis not present

## 2017-10-21 DIAGNOSIS — F329 Major depressive disorder, single episode, unspecified: Secondary | ICD-10-CM | POA: Diagnosis not present

## 2017-10-21 DIAGNOSIS — G809 Cerebral palsy, unspecified: Secondary | ICD-10-CM | POA: Diagnosis not present

## 2017-10-21 DIAGNOSIS — L22 Diaper dermatitis: Secondary | ICD-10-CM | POA: Diagnosis not present

## 2017-10-21 DIAGNOSIS — Z86718 Personal history of other venous thrombosis and embolism: Secondary | ICD-10-CM | POA: Diagnosis not present

## 2017-10-21 DIAGNOSIS — N319 Neuromuscular dysfunction of bladder, unspecified: Secondary | ICD-10-CM | POA: Diagnosis not present

## 2017-10-26 DIAGNOSIS — G809 Cerebral palsy, unspecified: Secondary | ICD-10-CM | POA: Diagnosis not present

## 2017-10-26 DIAGNOSIS — Z86718 Personal history of other venous thrombosis and embolism: Secondary | ICD-10-CM | POA: Diagnosis not present

## 2017-10-26 DIAGNOSIS — Z435 Encounter for attention to cystostomy: Secondary | ICD-10-CM | POA: Diagnosis not present

## 2017-10-26 DIAGNOSIS — L22 Diaper dermatitis: Secondary | ICD-10-CM | POA: Diagnosis not present

## 2017-10-26 DIAGNOSIS — N319 Neuromuscular dysfunction of bladder, unspecified: Secondary | ICD-10-CM | POA: Diagnosis not present

## 2017-10-26 DIAGNOSIS — F329 Major depressive disorder, single episode, unspecified: Secondary | ICD-10-CM | POA: Diagnosis not present

## 2017-10-28 ENCOUNTER — Inpatient Hospital Stay: Payer: Medicare Other | Attending: Oncology | Admitting: Oncology

## 2017-10-28 ENCOUNTER — Other Ambulatory Visit: Payer: Self-pay

## 2017-10-28 ENCOUNTER — Inpatient Hospital Stay: Payer: Medicare Other

## 2017-10-28 ENCOUNTER — Encounter: Payer: Self-pay | Admitting: Oncology

## 2017-10-28 VITALS — BP 126/79 | HR 82 | Temp 97.8°F | Resp 18 | Ht 70.0 in

## 2017-10-28 DIAGNOSIS — Z86718 Personal history of other venous thrombosis and embolism: Secondary | ICD-10-CM

## 2017-10-28 DIAGNOSIS — E538 Deficiency of other specified B group vitamins: Secondary | ICD-10-CM | POA: Diagnosis not present

## 2017-10-28 DIAGNOSIS — Z7901 Long term (current) use of anticoagulants: Secondary | ICD-10-CM

## 2017-10-28 DIAGNOSIS — D509 Iron deficiency anemia, unspecified: Secondary | ICD-10-CM | POA: Insufficient documentation

## 2017-10-28 DIAGNOSIS — Z993 Dependence on wheelchair: Secondary | ICD-10-CM | POA: Insufficient documentation

## 2017-10-28 LAB — IRON AND TIBC
IRON: 85 ug/dL (ref 45–182)
Saturation Ratios: 26 % (ref 17.9–39.5)
TIBC: 322 ug/dL (ref 250–450)
UIBC: 237 ug/dL

## 2017-10-28 LAB — CBC
HEMATOCRIT: 41.8 % (ref 40.0–52.0)
HEMOGLOBIN: 14.7 g/dL (ref 13.0–18.0)
MCH: 30.4 pg (ref 26.0–34.0)
MCHC: 35.1 g/dL (ref 32.0–36.0)
MCV: 86.6 fL (ref 80.0–100.0)
Platelets: 153 10*3/uL (ref 150–440)
RBC: 4.83 MIL/uL (ref 4.40–5.90)
RDW: 20.4 % — ABNORMAL HIGH (ref 11.5–14.5)
WBC: 5.3 10*3/uL (ref 3.8–10.6)

## 2017-10-28 LAB — FERRITIN: Ferritin: 182 ng/mL (ref 24–336)

## 2017-10-28 NOTE — Progress Notes (Signed)
Hematology/Oncology Consult note [facility]  Telephone:([phone] Fax:([phone]  Patient Care Team: Arnetha Courser, MD as PCP - General (Family Medicine) Nickie Retort, MD as Consulting Physician (Urology)   Name of the patient: Gerald Montgomery  [id number]  02-Mar-1956   Date of visit: 10/28/17  Diagnosis- 1. H/o RLE DVT  2. Iron deficiency anemia  Chief complaint/ Reason for visit- routine f/u iron deficiency anemia  Heme/Onc history: patient is a 62 year old male with a prior history of DVT and cerebral palsy who has been referred to [place] for anticoagulation management as well as iron deficiency anemia.  He is here with his legal guardian/mother Gerald Montgomery and his Sister Gerald Montgomery both of who care for him and involved in his healthcare.  He also has a history of chronic suprapubic catheter for which he sees urology.  Patient was ambulating with limitations up until 2012 when he had a fall and fractured his hip and femur.  Following that he has been essentially wheelchair-bound and needs assistance with his ADLs and IADLs   1. DVT: Patient was found to have DVT in the left superficial femoral vein in March 2012 and has been on Xarelto since then.  This DVT was spontaneous in the absence of any trauma or surgery and during that time patient was ambulating somewhat and not wheelchair dependent.  No other prior or subsequent history of DVT or PE.  No family history of DVT or PE.  He is currently tolerating Xarelto well without any significant side effects  2.  Iron deficiency anemia: Patient's baseline hemoglobin is between 11-12 but he has been developing progressive microcytosis since December 2018.  In September 2018 he was admitted to the hospital and his hemoglobin had dropped to 8.7.  At that time he was seen by Dr. Tarri Glenn and underwent EGD in the hospital which showed esophagitis.  He did not undergo a colonoscopy at that time.  Most  recent CBC from 08/03/2017 showed white count of 4.7, H&H of 11.2/35.9 with an MCV of 74.6 and a platelet count of 198.  Iron studies showed low iron of 29, ferritin that was low at 6 and elevated TIBC of 489.  Patient was prescribed oral iron but he has been having hard time tolerating oral iron because of abdominal pain.  Patient's family denies any consistent use of NSAIDs by the patient.  They have not noticed any blood in his stools or dark black melanotic stools.   Interval history- patient does not verbalize much due to cerebral palsy. History obtained with the help of sister and mother. He is feeling well at his baseline. No falls. Family has not noticed any blood in stool or urine  ECOG PS- 2-3   Review of systems- Review of Systems  Unable to perform ROS: Mental acuity      Allergies  Allergen Reactions  . Macrobid [Nitrofurantoin]   . Sulfa Antibiotics Itching     Past Medical History:  Diagnosis Date  . Cerebral palsy ([place])   . Depression   . History of DVT of lower extremity   . Mood disorder ([place])   . Neurogenic bladder   . Urinary incontinence      Past Surgical History:  Procedure Laterality Date  . ESOPHAGOGASTRODUODENOSCOPY Left 02/07/2017   Procedure: ESOPHAGOGASTRODUODENOSCOPY (EGD);  Surgeon: Thornton Park, MD;  Location: [facility];  Service: Gastroenterology;  Laterality: Left;  . filter surgery for blood clot  2013  .  HIP FRACTURE SURGERY  2013  . IR CATHETER TUBE CHANGE  07/07/2017    Social History   Socioeconomic History  . Marital status: Single    Spouse name: Not on file  . Number of children: Not on file  . Years of education: Not on file  . Highest education level: Not on file  Occupational History  . Not on file  Social Needs  . Financial resource strain: Not on file  . Food insecurity:    Worry: Not on file    Inability: Not on file  . Transportation needs:    Medical: Not on file    Non-medical: Not on file  Tobacco  Use  . Smoking status: Never Smoker  . Smokeless tobacco: Never Used  Substance and Sexual Activity  . Alcohol use: No    Alcohol/week: 0.0 oz  . Drug use: No  . Sexual activity: Never  Lifestyle  . Physical activity:    Days per week: Not on file    Minutes per session: Not on file  . Stress: Not on file  Relationships  . Social connections:    Talks on phone: Not on file    Gets together: Not on file    Attends religious service: Not on file    Active member of club or organization: Not on file    Attends meetings of clubs or organizations: Not on file    Relationship status: Not on file  . Intimate partner violence:    Fear of current or ex partner: Not on file    Emotionally abused: Not on file    Physically abused: Not on file    Forced sexual activity: Not on file  Other Topics Concern  . Not on file  Social History Narrative  . Not on file    Family History  Problem Relation Age of Onset  . Heart disease Mother   . Atrial fibrillation Mother   . Diabetes Mother        Borderline  . Heart disease Father        Passed away of heart attack  . Kidney disease Neg Hx   . Prostate cancer Neg Hx      Current Outpatient Medications:  .  acetaminophen (TYLENOL) 325 MG tablet, Take 1 tablet (325 mg total) by mouth every 6 (six) hours as needed for mild pain (or Fever >/= 101)., Disp: , Rfl:  .  ARIPiprazole (ABILIFY) 15 MG tablet, TAKE 1 TABLET(15 MG) BY MOUTH DAILY, Disp: 90 tablet, Rfl: 1 .  bisacodyl (DULCOLAX) 5 MG EC tablet, Take 1 tablet (5 mg total) by mouth daily as needed for moderate constipation., Disp: 30 tablet, Rfl: 0 .  Omeprazole (PRILOSEC PO), Take by mouth as needed., Disp: , Rfl:  .  XARELTO 10 MG TABS tablet, TAKE 1 TABLET(10 MG) BY MOUTH DAILY, Disp: 90 tablet, Rfl: 0  Physical exam:  Vitals:   10/28/17 0942  BP: 126/79  Pulse: 82  Resp: 18  Temp: 97.8 F (36.6 C)  TempSrc: Tympanic  SpO2: 99%  Height: 5\' 10"  (1.778 m)   Physical Exam    Constitutional:  Thin adult male sitting in a wheelchair. Appears in no acute distress  HENT:  Head: Normocephalic and atraumatic.  Eyes: Pupils are equal, round, and reactive to light. EOM are normal.  Neck: Normal range of motion.  Cardiovascular: Normal rate, regular rhythm and normal heart sounds.  Pulmonary/Chest: Effort normal and breath sounds normal.  Abdominal: Soft. Bowel  sounds are normal.  Musculoskeletal:  Spasticity due to cerebral palsy  Neurological: He is alert.  Skin: Skin is warm and dry.     CMP Latest Ref Rng & Units 08/24/2017  Glucose 65 - 99 mg/dL 138(H)  BUN 6 - 20 mg/dL 20  Creatinine 0.61 - 1.24 mg/dL 0.55(L)  Sodium 135 - 145 mmol/L 137  Potassium 3.5 - 5.1 mmol/L 4.0  Chloride 101 - 111 mmol/L 105  CO2 22 - 32 mmol/L 22  Calcium 8.9 - 10.3 mg/dL 9.1  Total Protein 6.5 - 8.1 g/dL 7.7  Total Bilirubin 0.3 - 1.2 mg/dL 0.7  Alkaline Phos 38 - 126 U/L 86  AST 15 - 41 U/L 22  ALT 17 - 63 U/L 25   CBC Latest Ref Rng & Units 10/28/2017  WBC 3.8 - 10.6 K/uL 5.3  Hemoglobin 13.0 - 18.0 g/dL 14.7  Hematocrit 40.0 - 52.0 % 41.8  Platelets 150 - 440 K/uL 153     Assessment and plan- Patient is a 62 y.o. male with following issues:  1. Iron deficiency anemia: after 2 doses of feraheme hb improved to 14.7 from 12.7. Iron studies from today are normal.   Repeat iron studies in 3 and 6 months and I will see him back in 6 months  Patient will be seeing Dr. Marius Ditch on 6/18 to discuss risks and benefits of colonoscopy. If he goes through colonoscopy, his xarelto needs to be held 2 days prior  2. B12 deficiency- he is on PO b12. Repeat levels in 3 months  3. H/o RLE DVT: hb stable. Continue xarelto   Visit Diagnosis 1. Iron deficiency anemia, unspecified iron deficiency anemia type   2. B12 deficiency   3. History of DVT of lower extremity      Dr. Randa Evens, MD, MPH [facility] at [facility] [phone] 10/28/2017 12:21 PM

## 2017-10-28 NOTE — Progress Notes (Signed)
Rash noted to right forearm

## 2017-11-01 DIAGNOSIS — G809 Cerebral palsy, unspecified: Secondary | ICD-10-CM | POA: Diagnosis not present

## 2017-11-01 DIAGNOSIS — F329 Major depressive disorder, single episode, unspecified: Secondary | ICD-10-CM | POA: Diagnosis not present

## 2017-11-01 DIAGNOSIS — Z435 Encounter for attention to cystostomy: Secondary | ICD-10-CM | POA: Diagnosis not present

## 2017-11-01 DIAGNOSIS — N319 Neuromuscular dysfunction of bladder, unspecified: Secondary | ICD-10-CM | POA: Diagnosis not present

## 2017-11-01 DIAGNOSIS — L22 Diaper dermatitis: Secondary | ICD-10-CM | POA: Diagnosis not present

## 2017-11-01 DIAGNOSIS — Z86718 Personal history of other venous thrombosis and embolism: Secondary | ICD-10-CM | POA: Diagnosis not present

## 2017-11-02 DIAGNOSIS — Z86718 Personal history of other venous thrombosis and embolism: Secondary | ICD-10-CM | POA: Diagnosis not present

## 2017-11-02 DIAGNOSIS — G809 Cerebral palsy, unspecified: Secondary | ICD-10-CM | POA: Diagnosis not present

## 2017-11-02 DIAGNOSIS — N319 Neuromuscular dysfunction of bladder, unspecified: Secondary | ICD-10-CM | POA: Diagnosis not present

## 2017-11-02 DIAGNOSIS — Z435 Encounter for attention to cystostomy: Secondary | ICD-10-CM | POA: Diagnosis not present

## 2017-11-02 DIAGNOSIS — L22 Diaper dermatitis: Secondary | ICD-10-CM | POA: Diagnosis not present

## 2017-11-02 DIAGNOSIS — F329 Major depressive disorder, single episode, unspecified: Secondary | ICD-10-CM | POA: Diagnosis not present

## 2017-11-05 ENCOUNTER — Telehealth: Payer: Self-pay | Admitting: Urology

## 2017-11-05 NOTE — Telephone Encounter (Signed)
Pt's daughter, [name] called to reschedule appt on 6/19 due to his colonoscopy appt the day before.  Pt is scheduled for SPT tube exchange.  I informed daughter it would be the end of July before I can get her in with [facility].  She wants someone to give her a call to make sure it's O.K. To wait that long.  Please give daughter a call [phone].

## 2017-11-09 ENCOUNTER — Ambulatory Visit: Payer: Medicare Other | Admitting: Gastroenterology

## 2017-11-09 NOTE — Telephone Encounter (Signed)
Spoke to daughter and she changed Colonoscopy appointment

## 2017-11-10 ENCOUNTER — Encounter: Payer: Self-pay | Admitting: Urology

## 2017-11-10 ENCOUNTER — Ambulatory Visit (INDEPENDENT_AMBULATORY_CARE_PROVIDER_SITE_OTHER): Payer: Medicare Other | Admitting: Urology

## 2017-11-10 VITALS — BP 142/79 | HR 81

## 2017-11-10 DIAGNOSIS — R339 Retention of urine, unspecified: Secondary | ICD-10-CM | POA: Diagnosis not present

## 2017-11-10 NOTE — Progress Notes (Signed)
11/10/2017 12:45 PM   Gerald Montgomery 1955-09-10 [id number]  Referring provider: Arnetha Courser, MD [address] [place], [place] [address]  Chief Complaint  Patient presents with  . Follow-up    HPI: 62 year old male with cerebral palsy and history of urinary retention and recurrent UTIs.  He had a suprapubic tube placed in February 2019 and has been doing well.  It has been changed monthly by home health and was last changed 3 days ago.  He had a follow-up visit scheduled today.  His daughter states there has been no problems.   PMH: Past Medical History:  Diagnosis Date  . Cerebral palsy ([place])   . Depression   . History of DVT of lower extremity   . Mood disorder ([place])   . Neurogenic bladder   . Urinary incontinence     Surgical History: Past Surgical History:  Procedure Laterality Date  . ESOPHAGOGASTRODUODENOSCOPY Left 02/07/2017   Procedure: ESOPHAGOGASTRODUODENOSCOPY (EGD);  Surgeon: Thornton Park, MD;  Location: [facility];  Service: Gastroenterology;  Laterality: Left;  . filter surgery for blood clot  2013  . HIP FRACTURE SURGERY  2013  . IR CATHETER TUBE CHANGE  07/07/2017    Home Medications:  Allergies as of 11/10/2017      Reactions   Macrobid [nitrofurantoin]    Sulfa Antibiotics Itching      Medication List        Accurate as of 11/10/17 12:45 PM. Always use your most recent med list.          acetaminophen 325 MG tablet Commonly known as:  TYLENOL Take 1 tablet (325 mg total) by mouth every 6 (six) hours as needed for mild pain (or Fever >/= 101).   ARIPiprazole 15 MG tablet Commonly known as:  ABILIFY TAKE 1 TABLET(15 MG) BY MOUTH DAILY   bisacodyl 5 MG EC tablet Commonly known as:  DULCOLAX Take 1 tablet (5 mg total) by mouth daily as needed for moderate constipation.   PRILOSEC PO Take by mouth as needed.   XARELTO 10 MG Tabs tablet Generic drug:  rivaroxaban TAKE 1 TABLET(10 MG) BY MOUTH DAILY        Allergies:  Allergies  Allergen Reactions  . Macrobid [Nitrofurantoin]   . Sulfa Antibiotics Itching    Family History: Family History  Problem Relation Age of Onset  . Heart disease Mother   . Atrial fibrillation Mother   . Diabetes Mother        Borderline  . Heart disease Father        Passed away of heart attack  . Kidney disease Neg Hx   . Prostate cancer Neg Hx     Social History:  reports that he has never smoked. He has never used smokeless tobacco. He reports that he does not drink alcohol or use drugs.  ROS: UROLOGY Frequent Urination?: No Hard to postpone urination?: No Burning/pain with urination?: No Get up at night to urinate?: No Leakage of urine?: No Urine stream starts and stops?: No Trouble starting stream?: No Do you have to strain to urinate?: No Blood in urine?: No Urinary tract infection?: No Sexually transmitted disease?: No Injury to kidneys or bladder?: No Painful intercourse?: No Weak stream?: No Erection problems?: No Penile pain?: No  Gastrointestinal Nausea?: No Vomiting?: No Indigestion/heartburn?: No Diarrhea?: No Constipation?: No  Constitutional Fever: No Night sweats?: No Weight loss?: No Fatigue?: No  Skin Skin rash/lesions?: No Itching?: No  Eyes Blurred vision?: No Double  vision?: No  Ears/Nose/Throat Sore throat?: No Sinus problems?: No  Hematologic/Lymphatic Swollen glands?: No Easy bruising?: No  Cardiovascular Leg swelling?: No Chest pain?: No  Respiratory Cough?: No Shortness of breath?: No  Endocrine Excessive thirst?: No  Musculoskeletal Back pain?: No Joint pain?: No  Neurological Headaches?: No Dizziness?: No  Psychologic Depression?: No Anxiety?: Yes  Physical Exam: BP (!) 142/79 (BP Location: Right Arm, Patient Position: Sitting, Cuff Size: Large)   Pulse 81   Constitutional:  Alert and oriented, No acute distress. HEENT: [place] AT, moist mucus membranes.  Trachea  midline, no masses. Cardiovascular: No clubbing, cyanosis, or edema. Respiratory: Normal respiratory effort, no increased work of breathing. GI: Abdomen is soft, nontender, nondistended, no abdominal masses.  The suprapubic tube site is clean and dry GU: No CVA tenderness.  Catheter draining clear urine Lymph: No cervical or inguinal lymphadenopathy. Skin: No rashes, bruises or suspicious lesions. Neurologic: Grossly intact, no focal deficits, moving all 4 extremities. Psychiatric: Normal mood and affect.   Assessment & Plan:   Currently doing well with suprapubic catheter drainage.  Continue monthly suprapubic tube changes.  I did discuss with the family increase incidence of bladder cancer secondary to chronic irritation and he will follow-up February 2020 for cystoscopy.  Abbie Sons, [place] [address], [place], [place] [address] [phone]

## 2017-11-11 DIAGNOSIS — L22 Diaper dermatitis: Secondary | ICD-10-CM | POA: Diagnosis not present

## 2017-11-11 DIAGNOSIS — Z86718 Personal history of other venous thrombosis and embolism: Secondary | ICD-10-CM | POA: Diagnosis not present

## 2017-11-11 DIAGNOSIS — G809 Cerebral palsy, unspecified: Secondary | ICD-10-CM | POA: Diagnosis not present

## 2017-11-11 DIAGNOSIS — N319 Neuromuscular dysfunction of bladder, unspecified: Secondary | ICD-10-CM | POA: Diagnosis not present

## 2017-11-11 DIAGNOSIS — F329 Major depressive disorder, single episode, unspecified: Secondary | ICD-10-CM | POA: Diagnosis not present

## 2017-11-11 DIAGNOSIS — Z435 Encounter for attention to cystostomy: Secondary | ICD-10-CM | POA: Diagnosis not present

## 2017-11-17 ENCOUNTER — Telehealth: Payer: Self-pay | Admitting: Family Medicine

## 2017-11-17 DIAGNOSIS — F329 Major depressive disorder, single episode, unspecified: Secondary | ICD-10-CM | POA: Diagnosis not present

## 2017-11-17 DIAGNOSIS — L22 Diaper dermatitis: Secondary | ICD-10-CM | POA: Diagnosis not present

## 2017-11-17 DIAGNOSIS — N319 Neuromuscular dysfunction of bladder, unspecified: Secondary | ICD-10-CM | POA: Diagnosis not present

## 2017-11-17 DIAGNOSIS — Z86718 Personal history of other venous thrombosis and embolism: Secondary | ICD-10-CM | POA: Diagnosis not present

## 2017-11-17 DIAGNOSIS — G809 Cerebral palsy, unspecified: Secondary | ICD-10-CM | POA: Diagnosis not present

## 2017-11-17 DIAGNOSIS — Z435 Encounter for attention to cystostomy: Secondary | ICD-10-CM | POA: Diagnosis not present

## 2017-11-17 MED ORDER — NYSTATIN 100000 UNIT/GM EX POWD: Freq: Three times a day (TID) | CUTANEOUS | 0 refills | Status: DC

## 2017-11-17 NOTE — Telephone Encounter (Signed)
New rx sent

## 2017-11-17 NOTE — Telephone Encounter (Signed)
Copied from [place] [phone]. Topic: Quick Communication - Rx Refill/Question >> Nov 17, 2017 11:51 AM Oliver Pila B wrote: Medication: nystatin (MYCOSTATIN/NYSTOP) powder [[id number]]  DISCONTINUED   Pt's guardian called and states the pt's skin is raw b/w the legs; contact if needed  Preferred Pharmacy (with phone number or street name): [facility]  Agent: Please be advised that RX refills may take up to 3 business days. We ask that you follow-up with your pharmacy.

## 2017-11-23 DIAGNOSIS — N319 Neuromuscular dysfunction of bladder, unspecified: Secondary | ICD-10-CM | POA: Diagnosis not present

## 2017-11-23 DIAGNOSIS — F329 Major depressive disorder, single episode, unspecified: Secondary | ICD-10-CM | POA: Diagnosis not present

## 2017-11-23 DIAGNOSIS — G809 Cerebral palsy, unspecified: Secondary | ICD-10-CM | POA: Diagnosis not present

## 2017-11-23 DIAGNOSIS — Z86718 Personal history of other venous thrombosis and embolism: Secondary | ICD-10-CM | POA: Diagnosis not present

## 2017-11-23 DIAGNOSIS — L22 Diaper dermatitis: Secondary | ICD-10-CM | POA: Diagnosis not present

## 2017-11-23 DIAGNOSIS — Z435 Encounter for attention to cystostomy: Secondary | ICD-10-CM | POA: Diagnosis not present

## 2017-12-02 DIAGNOSIS — N319 Neuromuscular dysfunction of bladder, unspecified: Secondary | ICD-10-CM | POA: Diagnosis not present

## 2017-12-02 DIAGNOSIS — Z435 Encounter for attention to cystostomy: Secondary | ICD-10-CM | POA: Diagnosis not present

## 2017-12-02 DIAGNOSIS — Z86718 Personal history of other venous thrombosis and embolism: Secondary | ICD-10-CM | POA: Diagnosis not present

## 2017-12-02 DIAGNOSIS — F329 Major depressive disorder, single episode, unspecified: Secondary | ICD-10-CM | POA: Diagnosis not present

## 2017-12-02 DIAGNOSIS — L22 Diaper dermatitis: Secondary | ICD-10-CM | POA: Diagnosis not present

## 2017-12-02 DIAGNOSIS — G809 Cerebral palsy, unspecified: Secondary | ICD-10-CM | POA: Diagnosis not present

## 2017-12-03 ENCOUNTER — Other Ambulatory Visit: Payer: Self-pay

## 2017-12-03 DIAGNOSIS — Z86718 Personal history of other venous thrombosis and embolism: Secondary | ICD-10-CM

## 2017-12-06 MED ORDER — RIVAROXABAN 10 MG PO TABS: ORAL_TABLET | ORAL | 0 refills | Status: DC

## 2017-12-10 DIAGNOSIS — G809 Cerebral palsy, unspecified: Secondary | ICD-10-CM | POA: Diagnosis not present

## 2017-12-10 DIAGNOSIS — L22 Diaper dermatitis: Secondary | ICD-10-CM | POA: Diagnosis not present

## 2017-12-10 DIAGNOSIS — Z86718 Personal history of other venous thrombosis and embolism: Secondary | ICD-10-CM | POA: Diagnosis not present

## 2017-12-10 DIAGNOSIS — F329 Major depressive disorder, single episode, unspecified: Secondary | ICD-10-CM | POA: Diagnosis not present

## 2017-12-10 DIAGNOSIS — Z435 Encounter for attention to cystostomy: Secondary | ICD-10-CM | POA: Diagnosis not present

## 2017-12-10 DIAGNOSIS — N319 Neuromuscular dysfunction of bladder, unspecified: Secondary | ICD-10-CM | POA: Diagnosis not present

## 2017-12-15 DIAGNOSIS — Z435 Encounter for attention to cystostomy: Secondary | ICD-10-CM | POA: Diagnosis not present

## 2017-12-15 DIAGNOSIS — Z86718 Personal history of other venous thrombosis and embolism: Secondary | ICD-10-CM | POA: Diagnosis not present

## 2017-12-15 DIAGNOSIS — F329 Major depressive disorder, single episode, unspecified: Secondary | ICD-10-CM | POA: Diagnosis not present

## 2017-12-15 DIAGNOSIS — G809 Cerebral palsy, unspecified: Secondary | ICD-10-CM | POA: Diagnosis not present

## 2017-12-15 DIAGNOSIS — N319 Neuromuscular dysfunction of bladder, unspecified: Secondary | ICD-10-CM | POA: Diagnosis not present

## 2017-12-15 DIAGNOSIS — L22 Diaper dermatitis: Secondary | ICD-10-CM | POA: Diagnosis not present

## 2017-12-17 DIAGNOSIS — Z86718 Personal history of other venous thrombosis and embolism: Secondary | ICD-10-CM | POA: Diagnosis not present

## 2017-12-17 DIAGNOSIS — N319 Neuromuscular dysfunction of bladder, unspecified: Secondary | ICD-10-CM | POA: Diagnosis not present

## 2017-12-17 DIAGNOSIS — L22 Diaper dermatitis: Secondary | ICD-10-CM | POA: Diagnosis not present

## 2017-12-17 DIAGNOSIS — Z435 Encounter for attention to cystostomy: Secondary | ICD-10-CM | POA: Diagnosis not present

## 2017-12-17 DIAGNOSIS — F329 Major depressive disorder, single episode, unspecified: Secondary | ICD-10-CM | POA: Diagnosis not present

## 2017-12-17 DIAGNOSIS — G809 Cerebral palsy, unspecified: Secondary | ICD-10-CM | POA: Diagnosis not present

## 2017-12-20 ENCOUNTER — Encounter: Payer: Self-pay | Admitting: Gastroenterology

## 2017-12-20 ENCOUNTER — Other Ambulatory Visit: Payer: Self-pay

## 2017-12-20 ENCOUNTER — Ambulatory Visit (INDEPENDENT_AMBULATORY_CARE_PROVIDER_SITE_OTHER): Payer: Medicare Other | Admitting: Gastroenterology

## 2017-12-20 VITALS — BP 138/94 | HR 84 | Resp 17

## 2017-12-20 DIAGNOSIS — D509 Iron deficiency anemia, unspecified: Secondary | ICD-10-CM

## 2017-12-20 DIAGNOSIS — D508 Other iron deficiency anemias: Secondary | ICD-10-CM

## 2017-12-20 NOTE — Progress Notes (Signed)
Cephas Darby, MD [address]  [place]  [place], [place] [address]  Main: [phone]  Fax: [phone]    Gastroenterology Consultation  Referring Provider:     Sindy Guadeloupe, MD Primary Care Physician:  Arnetha Courser, MD Primary Gastroenterologist:  Dr. Cephas Darby Reason for Consultation:   Iron deficiency anemia        HPI:   Gerald Montgomery is a 62 y.o. Caucasian male referred by Dr. Randa Evens  for consultation & management of and iron deficiency anemia Patient has history of cerebral palsy, bedbound due to hip and femur fracture since 2012 who has been followed by Dr. Janese Banks at [facility] for iron deficiency anemia and management of anticoagulation. Patient underwent EGD in 2018 which revealed severe esophageal candidiasis with esophagitis. Patient's hemoglobin has been gradually dropping since 01/2016 with progressive microcytosis since 06/2016, His ferritin levels were 6 in 07/2017.He also has B12 deficiency, most recent B12 levels were 151 on 08/2017. Patient received parenteral iron therapy which resulted in correction of iron deficiency anemia recently. Patient has been referred here to discuss about possible colonoscopy. Patient is accompanied by his 68 year old mom and his sister who is his healthcare power of attorney. Patient lives with his mother who takes care of him at home. His niece also helps with ADLs.  NSAIDs: none  Antiplts/Anticoagulants/Anti thrombotics: on several toe for giving thrombosis in the left superficial femoral vein since March 2012 which was thought to be unprovoked  GI Procedures: EGD by Dr. Tarri Glenn at [facility] in 01/2017 - LA Grade D reflux esophagitis. Biopsied. - Normal stomach. - Normal examined duodenum. DIAGNOSIS:  A. ESOPHAGUS, RANDOM; COLD BIOPSY:  - ACUTE EROSIVE ESOPHAGITIS.  - ESOPHAGEAL CANDIDIASIS.  - NEGATIVE FOR VIRUS CYTOPATHIC EFFECT, DYSPLASIA, AND  MALIGNANCY.    Past Medical History:  Diagnosis Date  . Cerebral palsy ([place])   . Depression   . History of DVT of lower extremity   . Mood disorder ([place])   . Neurogenic bladder   . Urinary incontinence     Past Surgical History:  Procedure Laterality Date  . ESOPHAGOGASTRODUODENOSCOPY Left 02/07/2017   Procedure: ESOPHAGOGASTRODUODENOSCOPY (EGD);  Surgeon: Thornton Park, MD;  Location: [facility];  Service: Gastroenterology;  Laterality: Left;  . filter surgery for blood clot  2013  . HIP FRACTURE SURGERY  2013  . IR CATHETER TUBE CHANGE  07/07/2017    Current Outpatient Medications:  .  acetaminophen (TYLENOL) 325 MG tablet, Take 1 tablet (325 mg total) by mouth every 6 (six) hours as needed for mild pain (or Fever >/= 101)., Disp: , Rfl:  .  ARIPiprazole (ABILIFY) 15 MG tablet, TAKE 1 TABLET(15 MG) BY MOUTH DAILY, Disp: 90 tablet, Rfl: 1 .  bisacodyl (DULCOLAX) 5 MG EC tablet, Take 1 tablet (5 mg total) by mouth daily as needed for moderate constipation., Disp: 30 tablet, Rfl: 0 .  nystatin (MYCOSTATIN/NYSTOP) powder, Apply topically 3 (three) times daily. To affected rash, Disp: 30 g, Rfl: 0 .  Omeprazole (PRILOSEC PO), Take by mouth as needed., Disp: , Rfl:  .  rivaroxaban (XARELTO) 10 MG TABS tablet, TAKE 1 TABLET(10 MG) BY MOUTH DAILY, Disp: 90 tablet, Rfl: 0    Family History  Problem Relation Age of Onset  . Heart disease Mother   . Atrial fibrillation Mother   . Diabetes Mother        Borderline  . Heart disease Father  Passed away of heart attack  . Kidney disease Neg Hx   . Prostate cancer Neg Hx      Social History   Tobacco Use  . Smoking status: Never Smoker  . Smokeless tobacco: Never Used  Substance Use Topics  . Alcohol use: No    Alcohol/week: 0.0 oz  . Drug use: No    Allergies as of 12/20/2017 - Review Complete 12/20/2017  Allergen Reaction Noted  . Macrobid [nitrofurantoin]  06/07/2017  . Sulfa antibiotics Itching 10/29/2015      Review of Systems:    All systems reviewed and negative except where noted in HPI.   Physical Exam:  BP (!) 138/94 (BP Location: Left Arm, Patient Position: Sitting, Cuff Size: Normal)   Pulse 84   Resp 17  No LMP for male patient.  General:   Alert, Thin built, frail-appearing Head:  Normocephalic and atraumatic. Eyes:  Sclera clear, no icterus.   Conjunctiva congested. Ears:  Normal auditory acuity. Nose:  No deformity, discharge, or lesions. Mouth:  No deformity or lesions,oropharynx pink & moist. Neck:  Supple; no masses or thyromegaly. Lungs:  Respirations even and unlabored.  Clear throughout to auscultation.   No wheezes, crackles, or rhonchi. No acute distress. Heart:  Regular rate and rhythm; no murmurs, clicks, rubs, or gallops. Abdomen:  Normal bowel sounds. Soft, non-tender and non-distended without masses, hepatosplenomegaly or hernias noted.  No guarding or rebound tenderness.   Rectal: Not performed Msk: muscle wasting in bilateral lower extremities, contractures in left upper and lower extremity  Pulses:  Normal pulses noted. Extremities:  No clubbing or edema.  No cyanosis. Neurologic:  Alert and oriented x2; Skin:  Intact without significant lesions or rashes. No jaundice. Lymph Nodes:  No significant cervical adenopathy. Psych:  Alert and cooperative  Imaging Studies: reviewed  Assessment and Plan:   Gerald Montgomery is a 62 y.o. Caucasian male with history of cerebral palsy, bedbound since 2012 after left hip and femur fracture, history of DVT, on rivaroxaban is seen in consultation for unexplained iron deficiency anemia. Patient is overdue for his colonoscopy. Also, given that he has history of unexplained iron deficiency anemia, ideally colonoscopy is warranted primarily to rule out colon cancer. However, given his medical condition and impaired functional status I discussed with patient's sister about the risks and benefits of procedure. He may not be a  surgical candidate or would be able to tolerate chemotherapy if he truly has colon cancer. She would like to pursue the colonoscopy but she finds it challenging for him to undergo bowel prep. She inquired about barium enema and CT colonography which would also require bowel prep. Have offered her various options of bowel preps and she chose to try Plenvue. Should also hold rivaroxaban for 2 days prior to the procedure.  - bowel prep instructions were provided I have discussed alternative options, risks & benefits,  which include, but are not limited to, bleeding, infection, perforation,respiratory complication & drug reaction.  The patient's sister agrees with this plan & written consent will be obtained.     Follow up at the time of colonoscopy   Cephas Darby, MD

## 2017-12-23 DIAGNOSIS — L22 Diaper dermatitis: Secondary | ICD-10-CM | POA: Diagnosis not present

## 2017-12-23 DIAGNOSIS — G809 Cerebral palsy, unspecified: Secondary | ICD-10-CM | POA: Diagnosis not present

## 2017-12-23 DIAGNOSIS — Z86718 Personal history of other venous thrombosis and embolism: Secondary | ICD-10-CM | POA: Diagnosis not present

## 2017-12-23 DIAGNOSIS — N319 Neuromuscular dysfunction of bladder, unspecified: Secondary | ICD-10-CM | POA: Diagnosis not present

## 2017-12-23 DIAGNOSIS — Z435 Encounter for attention to cystostomy: Secondary | ICD-10-CM | POA: Diagnosis not present

## 2017-12-23 DIAGNOSIS — F329 Major depressive disorder, single episode, unspecified: Secondary | ICD-10-CM | POA: Diagnosis not present

## 2017-12-24 ENCOUNTER — Encounter: Payer: Self-pay | Admitting: Emergency Medicine

## 2017-12-24 ENCOUNTER — Telehealth: Payer: Self-pay

## 2017-12-24 NOTE — Telephone Encounter (Signed)
Patients wife doesn't feel that he will be able to have colonoscopy.  She's going to prep as scheduled and arrive Monday am at 9:15 the time assigned.  Thanks [employer]

## 2017-12-27 ENCOUNTER — Encounter: Payer: Self-pay | Admitting: *Deleted

## 2017-12-27 ENCOUNTER — Other Ambulatory Visit: Payer: Self-pay

## 2017-12-27 ENCOUNTER — Ambulatory Visit
Admission: RE | Admit: 2017-12-27 | Discharge: 2017-12-27 | Disposition: A | Discharge status: Home / Self Care | Payer: Medicare Other | Source: Ambulatory Visit | Attending: Gastroenterology | Admitting: Gastroenterology

## 2017-12-27 ENCOUNTER — Ambulatory Visit: Payer: Medicare Other | Admitting: Anesthesiology

## 2017-12-27 ENCOUNTER — Encounter
Admission: RE | Disposition: A | Discharge status: Home / Self Care | Payer: Self-pay | Source: Ambulatory Visit | Attending: Gastroenterology

## 2017-12-27 ENCOUNTER — Telehealth: Payer: Self-pay | Admitting: Gastroenterology

## 2017-12-27 DIAGNOSIS — G809 Cerebral palsy, unspecified: Secondary | ICD-10-CM | POA: Insufficient documentation

## 2017-12-27 DIAGNOSIS — K209 Esophagitis, unspecified: Secondary | ICD-10-CM

## 2017-12-27 DIAGNOSIS — K2950 Unspecified chronic gastritis without bleeding: Secondary | ICD-10-CM | POA: Insufficient documentation

## 2017-12-27 DIAGNOSIS — Z86718 Personal history of other venous thrombosis and embolism: Secondary | ICD-10-CM | POA: Insufficient documentation

## 2017-12-27 DIAGNOSIS — F329 Major depressive disorder, single episode, unspecified: Secondary | ICD-10-CM | POA: Insufficient documentation

## 2017-12-27 DIAGNOSIS — D508 Other iron deficiency anemias: Secondary | ICD-10-CM

## 2017-12-27 DIAGNOSIS — Z7901 Long term (current) use of anticoagulants: Secondary | ICD-10-CM | POA: Insufficient documentation

## 2017-12-27 DIAGNOSIS — R32 Unspecified urinary incontinence: Secondary | ICD-10-CM | POA: Insufficient documentation

## 2017-12-27 DIAGNOSIS — F39 Unspecified mood [affective] disorder: Secondary | ICD-10-CM | POA: Diagnosis not present

## 2017-12-27 DIAGNOSIS — D509 Iron deficiency anemia, unspecified: Secondary | ICD-10-CM | POA: Diagnosis not present

## 2017-12-27 DIAGNOSIS — K219 Gastro-esophageal reflux disease without esophagitis: Secondary | ICD-10-CM | POA: Insufficient documentation

## 2017-12-27 DIAGNOSIS — Z9889 Other specified postprocedural states: Secondary | ICD-10-CM | POA: Insufficient documentation

## 2017-12-27 DIAGNOSIS — K449 Diaphragmatic hernia without obstruction or gangrene: Secondary | ICD-10-CM | POA: Diagnosis not present

## 2017-12-27 DIAGNOSIS — K259 Gastric ulcer, unspecified as acute or chronic, without hemorrhage or perforation: Secondary | ICD-10-CM | POA: Diagnosis not present

## 2017-12-27 DIAGNOSIS — K3189 Other diseases of stomach and duodenum: Secondary | ICD-10-CM

## 2017-12-27 DIAGNOSIS — K2960 Other gastritis without bleeding: Secondary | ICD-10-CM | POA: Diagnosis not present

## 2017-12-27 DIAGNOSIS — N319 Neuromuscular dysfunction of bladder, unspecified: Secondary | ICD-10-CM | POA: Diagnosis not present

## 2017-12-27 HISTORY — PX: ESOPHAGOGASTRODUODENOSCOPY (EGD) WITH PROPOFOL: SHX5813

## 2017-12-27 HISTORY — PX: COLONOSCOPY WITH PROPOFOL: SHX5780

## 2017-12-27 SURGERY — COLONOSCOPY WITH PROPOFOL
Anesthesia: General

## 2017-12-27 MED ORDER — SODIUM CHLORIDE 0.9 % IV SOLN
INTRAVENOUS | Status: DC
  Administered 2017-12-27: 11:00:00 via INTRAVENOUS

## 2017-12-27 MED ORDER — GLYCOPYRROLATE 0.2 MG/ML IJ SOLN
INTRAMUSCULAR | Status: DC | PRN
  Administered 2017-12-27: .1 mg via INTRAVENOUS

## 2017-12-27 MED ORDER — PROPOFOL 500 MG/50ML IV EMUL
INTRAVENOUS | Status: DC | PRN
  Administered 2017-12-27: 140 ug/kg/min via INTRAVENOUS

## 2017-12-27 MED ORDER — PHENYLEPHRINE HCL 10 MG/ML IJ SOLN
INTRAMUSCULAR | Status: DC | PRN
  Administered 2017-12-27: 100 ug via INTRAVENOUS

## 2017-12-27 MED ORDER — PROPOFOL 500 MG/50ML IV EMUL
INTRAVENOUS | Status: AC
  Filled 2017-12-27: qty 100

## 2017-12-27 MED ORDER — PROPOFOL 10 MG/ML IV BOLUS
INTRAVENOUS | Status: DC | PRN
  Administered 2017-12-27: 60 mg via INTRAVENOUS

## 2017-12-27 MED ORDER — PROPOFOL 500 MG/50ML IV EMUL
INTRAVENOUS | Status: AC
  Filled 2017-12-27: qty 50

## 2017-12-27 MED ORDER — LACTATED RINGERS IV SOLN: INTRAVENOUS | Status: DC | PRN

## 2017-12-27 MED ORDER — LIDOCAINE HCL (CARDIAC) PF 100 MG/5ML IV SOSY
PREFILLED_SYRINGE | INTRAVENOUS | Status: DC | PRN
  Administered 2017-12-27: 40 mg via INTRAVENOUS

## 2017-12-27 MED ORDER — LIDOCAINE HCL (PF) 2 % IJ SOLN
INTRAMUSCULAR | Status: AC
  Filled 2017-12-27: qty 10

## 2017-12-27 NOTE — Transfer of Care (Signed)
Immediate Anesthesia Transfer of Care Note  Patient: Gerald Montgomery  Procedure(s) Performed: COLONOSCOPY WITH PROPOFOL (N/A ) ESOPHAGOGASTRODUODENOSCOPY (EGD) WITH PROPOFOL  Patient Location: PACU  Anesthesia Type:General  Level of Consciousness: sedated  Airway & Oxygen Therapy: Patient Spontanous Breathing and Patient connected to nasal cannula oxygen  Post-op Assessment: Report given to RN and Post -op Vital signs reviewed and stable  Post vital signs: Reviewed and stable  Last Vitals:  Vitals Value Taken Time  BP 119/91 12/27/2017 11:41 AM  Temp 36.1 C 12/27/2017 11:41 AM  Pulse 84 12/27/2017 11:41 AM  Resp 26 12/27/2017 11:41 AM  SpO2 100 % 12/27/2017 11:41 AM  Vitals shown include unvalidated device data.  Last Pain:  Vitals:   12/27/17 1141  TempSrc: Tympanic  PainSc:          Complications: No apparent anesthesia complications

## 2017-12-27 NOTE — H&P (Signed)
Gerald Darby, MD [address]  [place]  [place], [place] [address]  Main: [phone]  Fax: [phone] Pager: [phone]  Primary Care Physician:  Arnetha Courser, MD Primary Gastroenterologist:  Dr. Cephas Montgomery  Pre-Procedure History & Physical: HPI:  Gerald Montgomery is a 62 y.o. male is here for an EGD and colonoscopy.   Past Medical History:  Diagnosis Date  . Cerebral palsy ([place])   . Depression   . History of DVT of lower extremity   . Mood disorder ([place])   . Neurogenic bladder   . Urinary incontinence     Past Surgical History:  Procedure Laterality Date  . ESOPHAGOGASTRODUODENOSCOPY Left 02/07/2017   Procedure: ESOPHAGOGASTRODUODENOSCOPY (EGD);  Surgeon: Thornton Park, MD;  Location: [facility];  Service: Gastroenterology;  Laterality: Left;  . filter surgery for blood clot  2013  . HIP FRACTURE SURGERY  2013  . IR CATHETER TUBE CHANGE  07/07/2017    Prior to Admission medications   Medication Sig Start Date End Date Taking? Authorizing Provider  acetaminophen (TYLENOL) 325 MG tablet Take 1 tablet (325 mg total) by mouth every 6 (six) hours as needed for mild pain (or Fever >/= 101). 02/08/17   Gouru, Illene Silver, MD  ARIPiprazole (ABILIFY) 15 MG tablet TAKE 1 TABLET(15 MG) BY MOUTH DAILY 08/17/17   Lada, Satira Anis, MD  bisacodyl (DULCOLAX) 5 MG EC tablet Take 1 tablet (5 mg total) by mouth daily as needed for moderate constipation. 05/21/17   Vaughan Basta, MD  nystatin (MYCOSTATIN/NYSTOP) powder Apply topically 3 (three) times daily. To affected rash 11/17/17   Arnetha Courser, MD  Omeprazole (PRILOSEC PO) Take by mouth as needed.    [provider]  rivaroxaban (XARELTO) 10 MG TABS tablet TAKE 1 TABLET(10 MG) BY MOUTH DAILY 12/06/17   Arnetha Courser, MD    Allergies as of 12/20/2017 - Review Complete 12/20/2017  Allergen Reaction Noted  . Macrobid [nitrofurantoin]  06/07/2017  . Sulfa antibiotics Itching 10/29/2015    Family  History  Problem Relation Age of Onset  . Heart disease Mother   . Atrial fibrillation Mother   . Diabetes Mother        Borderline  . Heart disease Father        Passed away of heart attack  . Kidney disease Neg Hx   . Prostate cancer Neg Hx     Social History   Socioeconomic History  . Marital status: Single    Spouse name: Not on file  . Number of children: Not on file  . Years of education: Not on file  . Highest education level: Not on file  Occupational History  . Not on file  Social Needs  . Financial resource strain: Not on file  . Food insecurity:    Worry: Not on file    Inability: Not on file  . Transportation needs:    Medical: Not on file    Non-medical: Not on file  Tobacco Use  . Smoking status: Never Smoker  . Smokeless tobacco: Never Used  Substance and Sexual Activity  . Alcohol use: No    Alcohol/week: 0.0 oz  . Drug use: No  . Sexual activity: Never  Lifestyle  . Physical activity:    Days per week: Not on file    Minutes per session: Not on file  . Stress: Not on file  Relationships  . Social connections:    Talks on phone: Not on file  Gets together: Not on file    Attends religious service: Not on file    Active member of club or organization: Not on file    Attends meetings of clubs or organizations: Not on file    Relationship status: Not on file  . Intimate partner violence:    Fear of current or ex partner: Not on file    Emotionally abused: Not on file    Physically abused: Not on file    Forced sexual activity: Not on file  Other Topics Concern  . Not on file  Social History Narrative  . Not on file    Review of Systems: See HPI, otherwise negative ROS  Physical Exam: BP (!) 134/107   Pulse (!) 107   Temp (!) 96.3 F (35.7 C) (Tympanic)   Resp 18   Ht 5\' 10"  (1.778 m)   Wt 125 lb (56.7 kg)   SpO2 100%   BMI 17.94 kg/m  General:   Alert,  pleasant and cooperative in NAD Head:  Normocephalic and  atraumatic. Neck:  Supple; no masses or thyromegaly. Lungs:  Clear throughout to auscultation.    Heart:  Regular rate and rhythm. Abdomen:  Soft, nontender and nondistended. Normal bowel sounds, without guarding, and without rebound.   Neurologic:  Alert and  oriented x4;  grossly normal neurologically.  Impression/Plan: Gerald Montgomery is here for an EGD and a colonoscopy to be performed for IDA  Risks, benefits, limitations, and alternatives regarding  endoscopy and colonoscopy have been reviewed with the patient.  Questions have been answered.  All parties agreeable.   [name] Sear, MD  12/27/2017, 11:12 AM

## 2017-12-27 NOTE — Anesthesia Post-op Follow-up Note (Signed)
Anesthesia QCDR form completed.        

## 2017-12-27 NOTE — Op Note (Signed)
[facility] Gastroenterology Patient Name: Gerald Montgomery Procedure Date: 12/27/2017 11:19 AM MRN: [id number] Account #: [id number] Date of Birth: Apr 08, 1956 Admit Type: Outpatient Age: 62 Room: [facility] ENDO ROOM 2 Gender: Male Note Status: Finalized Procedure:            Upper GI endoscopy Indications:          Unexplained iron deficiency anemia, Follow-up of                        esophagitis Providers:            Lin Landsman MD, MD Referring MD:         Arnetha Courser (Referring MD) Medicines:            Monitored Anesthesia Care Complications:        No immediate complications. Estimated blood loss:                        Minimal. Procedure:            Pre-Anesthesia Assessment:                       - Prior to the procedure, a History and Physical was                        performed, and patient medications and allergies were                        reviewed. The patient is competent. The risks and                        benefits of the procedure and the sedation options and                        risks were discussed with the patient. All questions                        were answered and informed consent was obtained.                        Patient identification and proposed procedure were                        verified by the physician, the nurse, the                        anesthesiologist, the anesthetist and the technician in                        the pre-procedure area in the procedure room in the                        endoscopy suite. Mental Status Examination: alert and                        oriented. Airway Examination: normal oropharyngeal                        airway and neck mobility. Respiratory Examination:  clear to auscultation. CV Examination: normal.                        Prophylactic Antibiotics: The patient does not require                        prophylactic antibiotics. Prior Anticoagulants: The                patient has taken Xarelto (rivaroxaban), last dose was                        3 days prior to procedure. ASA Grade Assessment: III -                        A patient with severe systemic disease. After reviewing                        the risks and benefits, the patient was deemed in                        satisfactory condition to undergo the procedure. The                        anesthesia plan was to use monitored anesthesia care                        (MAC). Immediately prior to administration of                        medications, the patient was re-assessed for adequacy                        to receive sedatives. The heart rate, respiratory rate,                        oxygen saturations, blood pressure, adequacy of                        pulmonary ventilation, and response to care were                        monitored throughout the procedure. The physical status                        of the patient was re-assessed after the procedure.                       After obtaining informed consent, the endoscope was                        passed under direct vision. Throughout the procedure,                        the patient's blood pressure, pulse, and oxygen                        saturations were monitored continuously. The Endoscope                        was introduced through the mouth,  and advanced to the                        second part of duodenum. The upper GI endoscopy was                        accomplished without difficulty. The patient tolerated                        the procedure well. Findings:      The duodenal bulb, second portion of the duodenum and examined duodenum       were normal. Biopsies for histology were taken with a cold forceps for       evaluation of celiac disease.      A few dispersed, diminutive non-bleeding erosions were found in the       gastric body. There were no stigmata of recent bleeding. Biopsies were       taken with a cold  forceps for Helicobacter pylori testing.      The gastroesophageal junction and examined esophagus were normal.      A 2 cm hiatal hernia was present. Impression:           - Normal duodenal bulb, second portion of the duodenum                        and examined duodenum. Biopsied.                       - Non-bleeding erosive gastropathy. Biopsied.                       - Normal gastroesophageal junction and esophagus.                       - 2 cm hiatal hernia. Recommendation:       - Discharge patient to home (with escort).                       - Resume previous diet today.                       - Continue present medications.                       - Pathology results                       - Colonoscopy not attempted today as prep was poor,                        discuss with family about rescheduling colonoscopy. Procedure Code(s):    --- Professional ---                       [phone], Esophagogastroduodenoscopy, flexible, transoral;                        with biopsy, single or multiple Diagnosis Code(s):    --- Professional ---                       K31.89, Other diseases of stomach and duodenum  K44.9, Diaphragmatic hernia without obstruction or                        gangrene                       D50.9, Iron deficiency anemia, unspecified                       K20.9, Esophagitis, unspecified CPT copyright 2017 American Medical Association. All rights reserved. The codes documented in this report are preliminary and upon coder review may  be revised to meet current compliance requirements. Dr. Ulyess Mort Lin Landsman MD, MD 12/27/2017 11:37:47 AM This report has been signed electronically. Number of Addenda: 0 Note Initiated On: 12/27/2017 11:19 AM      [facility]

## 2017-12-27 NOTE — Telephone Encounter (Signed)
Patient sister came in and states per Dr.Vanga at the [facility] today 8.5.19 the pt was not cleaned out and needs to pick up another prep sample. Suprep kit given out.

## 2017-12-27 NOTE — Anesthesia Preprocedure Evaluation (Signed)
Anesthesia Evaluation  Patient identified by MRN, date of birth, ID band Patient awake    Reviewed: Allergy & Precautions, H&P , NPO status , Patient's Chart, lab work & pertinent test results, reviewed documented beta blocker date and time   Airway Mallampati: II   Neck ROM: full    Dental  (+) Poor Dentition, Teeth Intact   Pulmonary neg pulmonary ROS,    Pulmonary exam normal        Cardiovascular Exercise Tolerance: Poor negative cardio ROS Normal cardiovascular exam Rhythm:regular Rate:Normal     Neuro/Psych CP hx negative neurological ROS  negative psych ROS   GI/Hepatic negative GI ROS, Neg liver ROS, GERD  Medicated,  Endo/Other  negative endocrine ROS  Renal/GU negative Renal ROS  negative genitourinary   Musculoskeletal   Abdominal   Peds  Hematology negative hematology ROS (+) anemia ,   Anesthesia Other Findings Past Medical History: No date: Cerebral palsy (HCC) No date: Depression No date: History of DVT of lower extremity No date: Mood disorder ([place]) No date: Neurogenic bladder No date: Urinary incontinence Past Surgical History: 02/07/2017: ESOPHAGOGASTRODUODENOSCOPY; Left     Comment:  Procedure: ESOPHAGOGASTRODUODENOSCOPY (EGD);  Surgeon:               Thornton Park, MD;  Location: [facility];                Service: Gastroenterology;  Laterality: Left; 2013: filter surgery for blood clot 2013: HIP FRACTURE SURGERY 07/07/2017: IR CATHETER TUBE CHANGE   Reproductive/Obstetrics negative OB ROS                             Anesthesia Physical Anesthesia Plan  ASA: III  Anesthesia Plan: General   Post-op Pain Management:    Induction:   PONV Risk Score and Plan:   Airway Management Planned:   Additional Equipment:   Intra-op Plan:   Post-operative Plan:   Informed Consent: I have reviewed the patients History and Physical, chart, labs and  discussed the procedure including the risks, benefits and alternatives for the proposed anesthesia with the patient or authorized representative who has indicated his/her understanding and acceptance.   Dental Advisory Given  Plan Discussed with: CRNA  Anesthesia Plan Comments:         Anesthesia Quick Evaluation

## 2017-12-28 ENCOUNTER — Encounter: Payer: Self-pay | Admitting: Gastroenterology

## 2017-12-28 LAB — SURGICAL PATHOLOGY

## 2017-12-28 NOTE — Anesthesia Postprocedure Evaluation (Signed)
Anesthesia Post Note  Patient: Gerald Montgomery  Procedure(s) Performed: COLONOSCOPY WITH PROPOFOL (N/A ) ESOPHAGOGASTRODUODENOSCOPY (EGD) WITH PROPOFOL  Patient location during evaluation: PACU Anesthesia Type: General Level of consciousness: awake and alert Pain management: pain level controlled Vital Signs Assessment: post-procedure vital signs reviewed and stable Respiratory status: spontaneous breathing, nonlabored ventilation, respiratory function stable and patient connected to nasal cannula oxygen Cardiovascular status: blood pressure returned to baseline and stable Postop Assessment: no apparent nausea or vomiting Anesthetic complications: no     Last Vitals:  Vitals:   12/27/17 1047 12/27/17 1141  BP: (!) 134/107 (!) 119/91  Pulse: (!) 107   Resp: 18   Temp: (!) 35.7 C (!) 36.1 C  SpO2: 100%     Last Pain:  Vitals:   12/27/17 1201  TempSrc:   PainSc: 0-No pain                 Molli Barrows

## 2017-12-30 DIAGNOSIS — Z435 Encounter for attention to cystostomy: Secondary | ICD-10-CM | POA: Diagnosis not present

## 2017-12-30 DIAGNOSIS — F329 Major depressive disorder, single episode, unspecified: Secondary | ICD-10-CM | POA: Diagnosis not present

## 2017-12-30 DIAGNOSIS — N319 Neuromuscular dysfunction of bladder, unspecified: Secondary | ICD-10-CM | POA: Diagnosis not present

## 2017-12-30 DIAGNOSIS — G809 Cerebral palsy, unspecified: Secondary | ICD-10-CM | POA: Diagnosis not present

## 2017-12-30 DIAGNOSIS — L22 Diaper dermatitis: Secondary | ICD-10-CM | POA: Diagnosis not present

## 2017-12-30 DIAGNOSIS — Z86718 Personal history of other venous thrombosis and embolism: Secondary | ICD-10-CM | POA: Diagnosis not present

## 2018-01-07 DIAGNOSIS — Z86718 Personal history of other venous thrombosis and embolism: Secondary | ICD-10-CM | POA: Diagnosis not present

## 2018-01-07 DIAGNOSIS — G809 Cerebral palsy, unspecified: Secondary | ICD-10-CM | POA: Diagnosis not present

## 2018-01-07 DIAGNOSIS — F329 Major depressive disorder, single episode, unspecified: Secondary | ICD-10-CM | POA: Diagnosis not present

## 2018-01-07 DIAGNOSIS — N319 Neuromuscular dysfunction of bladder, unspecified: Secondary | ICD-10-CM | POA: Diagnosis not present

## 2018-01-07 DIAGNOSIS — Z435 Encounter for attention to cystostomy: Secondary | ICD-10-CM | POA: Diagnosis not present

## 2018-01-07 DIAGNOSIS — L22 Diaper dermatitis: Secondary | ICD-10-CM | POA: Diagnosis not present

## 2018-01-12 DIAGNOSIS — G809 Cerebral palsy, unspecified: Secondary | ICD-10-CM | POA: Diagnosis not present

## 2018-01-12 DIAGNOSIS — Z435 Encounter for attention to cystostomy: Secondary | ICD-10-CM | POA: Diagnosis not present

## 2018-01-12 DIAGNOSIS — N319 Neuromuscular dysfunction of bladder, unspecified: Secondary | ICD-10-CM | POA: Diagnosis not present

## 2018-01-12 DIAGNOSIS — F329 Major depressive disorder, single episode, unspecified: Secondary | ICD-10-CM | POA: Diagnosis not present

## 2018-01-12 DIAGNOSIS — Z86718 Personal history of other venous thrombosis and embolism: Secondary | ICD-10-CM | POA: Diagnosis not present

## 2018-01-12 DIAGNOSIS — L22 Diaper dermatitis: Secondary | ICD-10-CM | POA: Diagnosis not present

## 2018-01-19 ENCOUNTER — Telehealth: Payer: Self-pay | Admitting: Urology

## 2018-01-19 NOTE — Telephone Encounter (Signed)
Please advise 

## 2018-01-19 NOTE — Telephone Encounter (Signed)
Pt guardian/sister, Gerald Montgomery, called office asking for a Rx to relax pt per PCP recommendation, Pt seems to contract, get stiff (muscles) every 2-3 weeks that causes urological problems, could not get specific details from [place] other than pt gets soaking wet from contracture of muscles, PCP advised Gerald Montgomery to ask urologist for something to relax the pt to prevent this. Please advise Gerald Montgomery at [phone], did not want to make appt until she spoke to someone due to the complexity of getting pt to appts. Please advise. Thanks.

## 2018-01-20 ENCOUNTER — Telehealth: Payer: Self-pay | Admitting: Urology

## 2018-01-20 DIAGNOSIS — G809 Cerebral palsy, unspecified: Secondary | ICD-10-CM | POA: Diagnosis not present

## 2018-01-20 DIAGNOSIS — Z86718 Personal history of other venous thrombosis and embolism: Secondary | ICD-10-CM | POA: Diagnosis not present

## 2018-01-20 DIAGNOSIS — N319 Neuromuscular dysfunction of bladder, unspecified: Secondary | ICD-10-CM | POA: Diagnosis not present

## 2018-01-20 DIAGNOSIS — Z435 Encounter for attention to cystostomy: Secondary | ICD-10-CM | POA: Diagnosis not present

## 2018-01-20 DIAGNOSIS — F329 Major depressive disorder, single episode, unspecified: Secondary | ICD-10-CM | POA: Diagnosis not present

## 2018-01-20 DIAGNOSIS — L22 Diaper dermatitis: Secondary | ICD-10-CM | POA: Diagnosis not present

## 2018-01-20 NOTE — Telephone Encounter (Signed)
The only thing I would be able to prescribe is something for bladder spasms.  If he needs something for generalized muscle contractions this would need to be prescribed by his PCP or whoever he sees for his cerebral palsy.

## 2018-01-20 NOTE — Telephone Encounter (Signed)
[name], pt caregiver, calling to follow up on previous call made for a Rx for pt muscle contractions causing him to wet himself. Please advise. Thanks.

## 2018-01-21 MED ORDER — FESOTERODINE FUMARATE ER 8 MG PO TB24: 8.0000 mg | ORAL_TABLET | Freq: Every day | ORAL | 2 refills | Status: DC

## 2018-01-21 NOTE — Telephone Encounter (Signed)
rx sent

## 2018-01-21 NOTE — Telephone Encounter (Signed)
SEE PREVIOUS ENCOUNTER

## 2018-01-21 NOTE — Addendum Note (Signed)
Addended by: John Giovanni C on: 01/21/2018 12:03 PM   Modules accepted: Orders

## 2018-01-21 NOTE — Telephone Encounter (Signed)
Spoke with pt sister, [name], she states pt is in fact having bladder spasms and would like for you to please call in something to [facility] in [place].

## 2018-01-28 ENCOUNTER — Telehealth: Payer: Self-pay | Admitting: Family Medicine

## 2018-01-28 ENCOUNTER — Inpatient Hospital Stay: Payer: Medicare Other | Attending: Oncology

## 2018-01-28 DIAGNOSIS — L22 Diaper dermatitis: Secondary | ICD-10-CM | POA: Diagnosis not present

## 2018-01-28 DIAGNOSIS — F329 Major depressive disorder, single episode, unspecified: Secondary | ICD-10-CM | POA: Diagnosis not present

## 2018-01-28 DIAGNOSIS — N319 Neuromuscular dysfunction of bladder, unspecified: Secondary | ICD-10-CM | POA: Diagnosis not present

## 2018-01-28 DIAGNOSIS — Z435 Encounter for attention to cystostomy: Secondary | ICD-10-CM | POA: Diagnosis not present

## 2018-01-28 DIAGNOSIS — Z86718 Personal history of other venous thrombosis and embolism: Secondary | ICD-10-CM | POA: Diagnosis not present

## 2018-01-28 DIAGNOSIS — G809 Cerebral palsy, unspecified: Secondary | ICD-10-CM | POA: Diagnosis not present

## 2018-01-28 NOTE — Telephone Encounter (Signed)
If requiring MD signature will have to be completed when PCP is in office. Can place under me if not.

## 2018-01-28 NOTE — Telephone Encounter (Signed)
Copied from [place] [id number]. Topic: Quick Communication - See Telephone Encounter >> Jan 28, 2018  2:29 PM Mylinda Latina, NT wrote: CRM for notification. See Telephone encounter for: 01/28/18. [name] ( OT) calling from [facility] is requesting verbal orders . The orders are 1 x a week for 5 weeks for Occupational Therapy.  She also needs a MD script written for the patient to get a wheelchair that can be in a shower. Or a shower chair that have wheels on it that he can transfer to . He also needs a customer wheelchair due to his cerebral palsy and his wounds on his bottom.  Patient falls out of his standard wheel chair he has now. Please call with verbal and fax script. [phone] fax# [phone]

## 2018-01-29 DIAGNOSIS — Z86718 Personal history of other venous thrombosis and embolism: Secondary | ICD-10-CM | POA: Diagnosis not present

## 2018-01-29 DIAGNOSIS — F329 Major depressive disorder, single episode, unspecified: Secondary | ICD-10-CM | POA: Diagnosis not present

## 2018-01-29 DIAGNOSIS — Z435 Encounter for attention to cystostomy: Secondary | ICD-10-CM | POA: Diagnosis not present

## 2018-01-29 DIAGNOSIS — G809 Cerebral palsy, unspecified: Secondary | ICD-10-CM | POA: Diagnosis not present

## 2018-01-29 DIAGNOSIS — N319 Neuromuscular dysfunction of bladder, unspecified: Secondary | ICD-10-CM | POA: Diagnosis not present

## 2018-01-29 DIAGNOSIS — L22 Diaper dermatitis: Secondary | ICD-10-CM | POA: Diagnosis not present

## 2018-01-30 NOTE — Telephone Encounter (Signed)
Okay for orders as requested 

## 2018-01-31 DIAGNOSIS — F329 Major depressive disorder, single episode, unspecified: Secondary | ICD-10-CM | POA: Diagnosis not present

## 2018-01-31 DIAGNOSIS — Z435 Encounter for attention to cystostomy: Secondary | ICD-10-CM | POA: Diagnosis not present

## 2018-01-31 DIAGNOSIS — N319 Neuromuscular dysfunction of bladder, unspecified: Secondary | ICD-10-CM | POA: Diagnosis not present

## 2018-01-31 DIAGNOSIS — G809 Cerebral palsy, unspecified: Secondary | ICD-10-CM | POA: Diagnosis not present

## 2018-01-31 DIAGNOSIS — Z86718 Personal history of other venous thrombosis and embolism: Secondary | ICD-10-CM | POA: Diagnosis not present

## 2018-01-31 DIAGNOSIS — L22 Diaper dermatitis: Secondary | ICD-10-CM | POA: Diagnosis not present

## 2018-01-31 NOTE — Telephone Encounter (Signed)
Verbal orders given and rx faxed

## 2018-02-04 DIAGNOSIS — N319 Neuromuscular dysfunction of bladder, unspecified: Secondary | ICD-10-CM | POA: Diagnosis not present

## 2018-02-04 DIAGNOSIS — Z86718 Personal history of other venous thrombosis and embolism: Secondary | ICD-10-CM | POA: Diagnosis not present

## 2018-02-04 DIAGNOSIS — F329 Major depressive disorder, single episode, unspecified: Secondary | ICD-10-CM | POA: Diagnosis not present

## 2018-02-04 DIAGNOSIS — G809 Cerebral palsy, unspecified: Secondary | ICD-10-CM | POA: Diagnosis not present

## 2018-02-04 DIAGNOSIS — L22 Diaper dermatitis: Secondary | ICD-10-CM | POA: Diagnosis not present

## 2018-02-04 DIAGNOSIS — Z435 Encounter for attention to cystostomy: Secondary | ICD-10-CM | POA: Diagnosis not present

## 2018-02-11 ENCOUNTER — Telehealth: Payer: Self-pay | Admitting: Family Medicine

## 2018-02-11 DIAGNOSIS — Z435 Encounter for attention to cystostomy: Secondary | ICD-10-CM | POA: Diagnosis not present

## 2018-02-11 DIAGNOSIS — L22 Diaper dermatitis: Secondary | ICD-10-CM | POA: Diagnosis not present

## 2018-02-11 DIAGNOSIS — N319 Neuromuscular dysfunction of bladder, unspecified: Secondary | ICD-10-CM | POA: Diagnosis not present

## 2018-02-11 DIAGNOSIS — Z86718 Personal history of other venous thrombosis and embolism: Secondary | ICD-10-CM | POA: Diagnosis not present

## 2018-02-11 DIAGNOSIS — F329 Major depressive disorder, single episode, unspecified: Secondary | ICD-10-CM | POA: Diagnosis not present

## 2018-02-11 DIAGNOSIS — G809 Cerebral palsy, unspecified: Secondary | ICD-10-CM | POA: Diagnosis not present

## 2018-02-11 NOTE — Telephone Encounter (Signed)
Copied from [place] [id number]. Topic: Quick Communication - See Telephone Encounter >> Feb 11, 2018 10:13 AM Burchel, Abbi R wrote: CRM for notification. See Telephone encounter for: 02/11/18.  Darylene Price is requesting a rx for wedge wheelchair cushion (18"x16") faxed to: [facility] [phone])  Please include dx code for cerebal palsy, DEA #, and NPI #.     Also requesting New v/o OT 1x8wk   Ph: [phone]

## 2018-02-12 NOTE — Telephone Encounter (Signed)
Please write out and I'll be glad to sign

## 2018-02-13 DIAGNOSIS — Z435 Encounter for attention to cystostomy: Secondary | ICD-10-CM | POA: Diagnosis not present

## 2018-02-13 DIAGNOSIS — G809 Cerebral palsy, unspecified: Secondary | ICD-10-CM | POA: Diagnosis not present

## 2018-02-13 DIAGNOSIS — M6281 Muscle weakness (generalized): Secondary | ICD-10-CM | POA: Diagnosis not present

## 2018-02-13 DIAGNOSIS — N319 Neuromuscular dysfunction of bladder, unspecified: Secondary | ICD-10-CM | POA: Diagnosis not present

## 2018-02-13 DIAGNOSIS — F329 Major depressive disorder, single episode, unspecified: Secondary | ICD-10-CM | POA: Diagnosis not present

## 2018-02-13 DIAGNOSIS — L22 Diaper dermatitis: Secondary | ICD-10-CM | POA: Diagnosis not present

## 2018-02-14 DIAGNOSIS — F329 Major depressive disorder, single episode, unspecified: Secondary | ICD-10-CM | POA: Diagnosis not present

## 2018-02-14 DIAGNOSIS — M6281 Muscle weakness (generalized): Secondary | ICD-10-CM | POA: Diagnosis not present

## 2018-02-14 DIAGNOSIS — N319 Neuromuscular dysfunction of bladder, unspecified: Secondary | ICD-10-CM | POA: Diagnosis not present

## 2018-02-14 DIAGNOSIS — L22 Diaper dermatitis: Secondary | ICD-10-CM | POA: Diagnosis not present

## 2018-02-14 DIAGNOSIS — G809 Cerebral palsy, unspecified: Secondary | ICD-10-CM | POA: Diagnosis not present

## 2018-02-14 DIAGNOSIS — Z435 Encounter for attention to cystostomy: Secondary | ICD-10-CM | POA: Diagnosis not present

## 2018-02-14 NOTE — Telephone Encounter (Signed)
OT Gerald Montgomery notified

## 2018-02-14 NOTE — Telephone Encounter (Signed)
Done

## 2018-02-14 NOTE — Telephone Encounter (Signed)
cherly calling from [facility] states that she does not have a wedge but has a square cushion. Will this be okay? Cb#[phone]

## 2018-02-16 ENCOUNTER — Encounter

## 2018-02-16 ENCOUNTER — Ambulatory Visit: Payer: Medicare Other | Admitting: Family Medicine

## 2018-02-17 DIAGNOSIS — N319 Neuromuscular dysfunction of bladder, unspecified: Secondary | ICD-10-CM | POA: Diagnosis not present

## 2018-02-17 DIAGNOSIS — L22 Diaper dermatitis: Secondary | ICD-10-CM | POA: Diagnosis not present

## 2018-02-17 DIAGNOSIS — M6281 Muscle weakness (generalized): Secondary | ICD-10-CM | POA: Diagnosis not present

## 2018-02-17 DIAGNOSIS — Z435 Encounter for attention to cystostomy: Secondary | ICD-10-CM | POA: Diagnosis not present

## 2018-02-17 DIAGNOSIS — F329 Major depressive disorder, single episode, unspecified: Secondary | ICD-10-CM | POA: Diagnosis not present

## 2018-02-17 DIAGNOSIS — G809 Cerebral palsy, unspecified: Secondary | ICD-10-CM | POA: Diagnosis not present

## 2018-02-18 ENCOUNTER — Ambulatory Visit (INDEPENDENT_AMBULATORY_CARE_PROVIDER_SITE_OTHER): Payer: Medicare Other | Admitting: Family Medicine

## 2018-02-18 ENCOUNTER — Telehealth: Payer: Self-pay | Admitting: Family Medicine

## 2018-02-18 ENCOUNTER — Encounter: Payer: Self-pay | Admitting: Family Medicine

## 2018-02-18 VITALS — BP 126/70 | HR 93 | Temp 98.0°F | Ht 66.0 in | Wt 118.6 lb

## 2018-02-18 DIAGNOSIS — G809 Cerebral palsy, unspecified: Secondary | ICD-10-CM | POA: Diagnosis not present

## 2018-02-18 DIAGNOSIS — M24542 Contracture, left hand: Secondary | ICD-10-CM

## 2018-02-18 DIAGNOSIS — Z23 Encounter for immunization: Secondary | ICD-10-CM | POA: Diagnosis not present

## 2018-02-18 DIAGNOSIS — R21 Rash and other nonspecific skin eruption: Secondary | ICD-10-CM | POA: Diagnosis not present

## 2018-02-18 MED ORDER — GERHARDT'S BUTT CREAM: 1.0000 "application " | TOPICAL_CREAM | Freq: Four times a day (QID) | CUTANEOUS | 5 refills | Status: DC

## 2018-02-18 MED ORDER — NYSTATIN 100000 UNIT/GM EX POWD: Freq: Three times a day (TID) | CUTANEOUS | 5 refills | Status: DC

## 2018-02-18 NOTE — Patient Instructions (Addendum)
Keep up the great work and call me if needed You received the flu shot today; it should protect you against the flu virus over the coming months; it will take about two weeks for antibodies to develop; do try to stay away from hospitals, nursing homes, and daycares during peak flu season; taking extra vitamin C daily during flu season may help you avoid getting sick

## 2018-02-18 NOTE — Telephone Encounter (Signed)
Copied from [place] [id number]. Topic: Quick Communication - See Telephone Encounter >> Feb 18, 2018  4:12 PM Blase Mess A wrote: CRM for notification. See Telephone encounter for: 02/18/18. Estill Bamberg from [facility] is calling about Hydrocortisone (GERHARDT'S BUTT CREAM) CREA [[id number]] She wants to know % off of Hydocortisone. And the ratio. [phone]

## 2018-02-18 NOTE — Progress Notes (Signed)
BP 126/70   Pulse 93   Temp 98 F (36.7 C) (Oral)   Ht 5\' 6"  (1.676 m)   Wt 118 lb 9.6 oz (53.8 kg)   SpO2 98%   BMI 19.14 kg/m    Subjective:    Patient ID: Gerald Montgomery, male    DOB: 03/24/1956, 62 y.o.   MRN: [id number]  HPI: Gerald Montgomery is a 62 y.o. male  Chief Complaint  Patient presents with  . paperwork    wheelchair  . Vaginitis    in between legs  . bedsore    butt paste something with zinc    HPI Patient is here for paperwork, face-to-face visit New wheelchair and seat to go in it Unable to stand for weight and height; about 5'8" and 130 pounds or so; they weighed him with the wheelchair before Mobility is limited, lifelong from cerebral palsy; he does not walk at all; cannot move his legs at all; he can feel his legs, but cannot move them Does need a brace for the left hand; OT came out one time, never showed back up; PT brought some balls but he doesn't do it  Symptoms: unable to move legs Dx: CP; affects both legs and LEFT arm Medications: nothing now Progression: stable Other dx: bladder spasms, taking medicine for that Walking: zero feet Hx of falls: yes, due to negligence from previous doctor; none with family Pace of ambulation: zero Ambulatory assistance: n/a, cannot walk at all No change in status, just now requesting Able to use manual wheelchair: yes with family help; he can roll it a little with two fingers on left hand (index and middle) and right hand He cannot drive a scooter Home setting: one level living; wheelchair ramp; doorway is wide enough  Balance: slumps to the left Rash in between the legs  He is getting ready to get the suprapubic catheter changed, ASAP with [facility]; NO, this is an OLD fax from December of 2018; he did great and held the Xarelto and did fine  Depression screen [facility] 2/9 02/18/2018 08/03/2017 05/31/2017 03/25/2017 01/05/2017  Decreased Interest 0 0 0 0 0  Down, Depressed, Hopeless 0 0 0 0 0  PHQ -  2 Score 0 0 0 0 0  Altered sleeping 0 - - - -  Tired, decreased energy 0 - - - -  Change in appetite 0 - - - -  Feeling bad or failure about yourself  0 - - - -  Trouble concentrating 0 - - - -  Moving slowly or fidgety/restless 0 - - - -  Suicidal thoughts 0 - - - -  PHQ-9 Score 0 - - - -  Difficult doing work/chores Not difficult at all - - - -   Fall Risk  02/18/2018 08/03/2017 05/31/2017 03/25/2017 01/05/2017  Falls in the past year? No No No No No  Risk for fall due to : - - - - -    Relevant past medical, surgical, family and social history reviewed Past Medical History:  Diagnosis Date  . Cerebral palsy ([place])   . Depression   . History of DVT of lower extremity   . Mood disorder ([place])   . Neurogenic bladder   . Urinary incontinence    Past Surgical History:  Procedure Laterality Date  . COLONOSCOPY WITH PROPOFOL N/A 12/27/2017   Procedure: COLONOSCOPY WITH PROPOFOL;  Surgeon: Lin Landsman, MD;  [facility] ENDOSCOPY;  Service: Gastroenterology;  Laterality: N/A;  .  ESOPHAGOGASTRODUODENOSCOPY Left 02/07/2017   Procedure: ESOPHAGOGASTRODUODENOSCOPY (EGD);  Surgeon: Thornton Park, MD;  Location: [facility];  Service: Gastroenterology;  Laterality: Left;  . ESOPHAGOGASTRODUODENOSCOPY (EGD) WITH PROPOFOL  12/27/2017   Procedure: ESOPHAGOGASTRODUODENOSCOPY (EGD) WITH PROPOFOL;  Surgeon: Lin Landsman, MD;  Location: [facility] ENDOSCOPY;  Service: Gastroenterology;;  . filter surgery for blood clot  2013  . HIP FRACTURE SURGERY  2013  . IR CATHETER TUBE CHANGE  07/07/2017   Family History  Problem Relation Age of Onset  . Heart disease Mother   . Atrial fibrillation Mother   . Diabetes Mother        Borderline  . Heart disease Father        Passed away of heart attack  . Kidney disease Neg Hx   . Prostate cancer Neg Hx    Social History   Tobacco Use  . Smoking status: Never Smoker  . Smokeless tobacco: Never Used  Substance Use Topics  . Alcohol use: No      Alcohol/week: 0.0 standard drinks  . Drug use: No     [facility] Visit from [facility]/18/2018 in [facility]  AUDIT-C Score  0      Interim medical history since last visit reviewed. Allergies and medications reviewed  Review of Systems Per HPI unless specifically indicated above     Objective:    BP 126/70   Pulse 93   Temp 98 F (36.7 C) (Oral)   Ht 5\' 6"  (1.676 m)   Wt 118 lb 9.6 oz (53.8 kg)   SpO2 98%   BMI 19.14 kg/m    Physical Exam  Constitutional: No distress.  Thin male in wheelchair; lower extremities with muscular atrophy; no acute distress  HENT:  Head: Normocephalic and atraumatic.  Eyes: EOM are normal. No scleral icterus.  Neck: No thyromegaly present.  Cardiovascular: Normal rate and regular rhythm.  Pulmonary/Chest: Effort normal and breath sounds normal.  Abdominal: Soft. Bowel sounds are normal. He exhibits no distension.  Musculoskeletal: He exhibits no edema.       Left hand: He exhibits deformity (contracture deformity LEFT hand).  Neurological: Coordination normal.  Dense paraplegia in the legs; left arm is 3/5, right arm 5/5  Skin: Skin is warm and dry. No pallor.  Psychiatric: He has a normal mood and affect. His behavior is normal. Judgment and thought content normal.  Pleasant and cooperative       Assessment & Plan:   Problem List Items Addressed This Visit      Nervous and Auditory   Cerebral palsy ([place]) - Primary    Assessment completed; order for MANUAL wheelchair today approved; family / patient do NOT want motorized wheechair or scooter so that is NOT approved      Relevant Orders   Ambulatory referral to Occupational Therapy   Ambulatory referral to Physical Therapy   Ambulatory referral to Connected Care   DME Wheelchair manual    Other Visit Diagnoses    Contracture of joint of hand, left       Relevant Orders   Ambulatory referral to Occupational Therapy   Ambulatory referral to Physical Therapy   Need  for influenza vaccination       Relevant Orders   Flu Vaccine QUAD 6+ mos PF IM (Fluarix Quad PF) (Completed)   Rash       will see if PT can assist with something to prevent thigh from rubbing, become hot and contributing to rash  Follow up plan: No follow-ups on file.  An after-visit summary was printed and given to the patient at [facility].  Please see the patient instructions which may contain other information and recommendations beyond what is mentioned above in the assessment and plan.  Meds ordered this encounter  Medications  . Hydrocortisone (GERHARDT'S BUTT CREAM) CREA    Sig: Apply 1 application topically 4 (four) times daily.    Dispense:  1 each    Refill:  5    Patient's family requesting something with zinc; let me know if you have a better option in mind  . nystatin (MYCOSTATIN/NYSTOP) powder    Sig: Apply topically 3 (three) times daily. To affected rash    Dispense:  30 g    Refill:  5    Orders Placed This Encounter  Procedures  . DME Wheelchair manual  . Flu Vaccine QUAD 6+ mos PF IM (Fluarix Quad PF)  . Ambulatory referral to Occupational Therapy  . Ambulatory referral to Physical Therapy  . Ambulatory referral to [place]

## 2018-02-21 ENCOUNTER — Ambulatory Visit: Payer: Medicare Other | Admitting: Family Medicine

## 2018-02-21 NOTE — Telephone Encounter (Signed)
They have Dermacloud; no HC to it; very commonly used; has 30% zinc oxide; they bought it on Friday Okay to add [facility] to the Dermacloud or TAC 0.1% or nothing Pharmacist will call and f/u with them to see what is needed and I'll sign off on either compounded HC or new TAC Rx

## 2018-02-21 NOTE — Assessment & Plan Note (Signed)
Assessment completed; order for MANUAL wheelchair today approved; family / patient do NOT want motorized wheechair or scooter so that is NOT approved

## 2018-02-24 DIAGNOSIS — Z435 Encounter for attention to cystostomy: Secondary | ICD-10-CM | POA: Diagnosis not present

## 2018-02-24 DIAGNOSIS — M6281 Muscle weakness (generalized): Secondary | ICD-10-CM | POA: Diagnosis not present

## 2018-02-24 DIAGNOSIS — N319 Neuromuscular dysfunction of bladder, unspecified: Secondary | ICD-10-CM | POA: Diagnosis not present

## 2018-02-24 DIAGNOSIS — G809 Cerebral palsy, unspecified: Secondary | ICD-10-CM | POA: Diagnosis not present

## 2018-02-24 DIAGNOSIS — F329 Major depressive disorder, single episode, unspecified: Secondary | ICD-10-CM | POA: Diagnosis not present

## 2018-02-24 DIAGNOSIS — L22 Diaper dermatitis: Secondary | ICD-10-CM | POA: Diagnosis not present

## 2018-02-25 ENCOUNTER — Inpatient Hospital Stay: Payer: Medicare Other | Attending: Oncology

## 2018-02-25 ENCOUNTER — Other Ambulatory Visit: Payer: Self-pay

## 2018-02-25 DIAGNOSIS — E538 Deficiency of other specified B group vitamins: Secondary | ICD-10-CM

## 2018-02-25 DIAGNOSIS — D509 Iron deficiency anemia, unspecified: Secondary | ICD-10-CM

## 2018-02-25 LAB — CBC
HCT: 43.9 % (ref 40.0–52.0)
HEMOGLOBIN: 15 g/dL (ref 13.0–18.0)
MCH: 31.1 pg (ref 26.0–34.0)
MCHC: 34.1 g/dL (ref 32.0–36.0)
MCV: 91.3 fL (ref 80.0–100.0)
Platelets: 162 10*3/uL (ref 150–440)
RBC: 4.82 MIL/uL (ref 4.40–5.90)
RDW: 14 % (ref 11.5–14.5)
WBC: 5.7 10*3/uL (ref 3.8–10.6)

## 2018-02-25 LAB — VITAMIN B12: VITAMIN B 12: 280 pg/mL (ref 180–914)

## 2018-02-25 LAB — IRON AND TIBC
Iron: 80 ug/dL (ref 45–182)
Saturation Ratios: 23 % (ref 17.9–39.5)
TIBC: 343 ug/dL (ref 250–450)
UIBC: 263 ug/dL

## 2018-02-25 LAB — FERRITIN: FERRITIN: 112 ng/mL (ref 24–336)

## 2018-03-01 DIAGNOSIS — Z435 Encounter for attention to cystostomy: Secondary | ICD-10-CM | POA: Diagnosis not present

## 2018-03-01 DIAGNOSIS — L22 Diaper dermatitis: Secondary | ICD-10-CM | POA: Diagnosis not present

## 2018-03-01 DIAGNOSIS — F329 Major depressive disorder, single episode, unspecified: Secondary | ICD-10-CM | POA: Diagnosis not present

## 2018-03-01 DIAGNOSIS — M6281 Muscle weakness (generalized): Secondary | ICD-10-CM | POA: Diagnosis not present

## 2018-03-01 DIAGNOSIS — G809 Cerebral palsy, unspecified: Secondary | ICD-10-CM | POA: Diagnosis not present

## 2018-03-01 DIAGNOSIS — N319 Neuromuscular dysfunction of bladder, unspecified: Secondary | ICD-10-CM | POA: Diagnosis not present

## 2018-03-08 ENCOUNTER — Other Ambulatory Visit: Payer: Self-pay | Admitting: Family Medicine

## 2018-03-08 DIAGNOSIS — Z86718 Personal history of other venous thrombosis and embolism: Secondary | ICD-10-CM

## 2018-03-08 DIAGNOSIS — F39 Unspecified mood [affective] disorder: Secondary | ICD-10-CM

## 2018-03-08 MED ORDER — RIVAROXABAN 10 MG PO TABS: ORAL_TABLET | ORAL | 1 refills | Status: DC

## 2018-03-08 MED ORDER — ARIPIPRAZOLE 15 MG PO TABS: ORAL_TABLET | ORAL | 1 refills | Status: DC

## 2018-03-08 NOTE — Telephone Encounter (Signed)
Refill request:  rivaroxaban (XARELTO) 10 MG TABS tablet and ARIPiprazole (ABILIFY) 15 MG tablet --- pt is completely out.       Preferred Pharmacy (with phone number or street name): [facility], [place].  [phone] (Phone) [phone] (Fax)

## 2018-03-08 NOTE — Telephone Encounter (Signed)
Copied from [place] [id number]. Topic: Quick Communication - Rx Refill/Question >> Mar 08, 2018 12:15 PM Wynetta Emery, [place] C wrote: Medication: rivaroxaban (XARELTO) 10 MG TABS tablet and also ARIPiprazole (ABILIFY) 15 MG tablet --- pt is completely out.   Has the patient contacted their pharmacy? No   (Agent: If no, request that the patient contact the pharmacy for the refill.) (Agent: If yes, when and what did the pharmacy advise?)  Preferred Pharmacy (with phone number or street name): [facility], [place]. [phone] (Phone) [phone] (Fax)    Agent: Please be advised that RX refills may take up to 3 business days. We ask that you follow-up with your pharmacy.

## 2018-03-08 NOTE — Telephone Encounter (Signed)
I reviewed Dr. Elroy Channel note from June 2019; Xarelto 10 mg daily; continue same; Rx approved

## 2018-03-09 DIAGNOSIS — L22 Diaper dermatitis: Secondary | ICD-10-CM | POA: Diagnosis not present

## 2018-03-09 DIAGNOSIS — F329 Major depressive disorder, single episode, unspecified: Secondary | ICD-10-CM | POA: Diagnosis not present

## 2018-03-09 DIAGNOSIS — N319 Neuromuscular dysfunction of bladder, unspecified: Secondary | ICD-10-CM | POA: Diagnosis not present

## 2018-03-09 DIAGNOSIS — G809 Cerebral palsy, unspecified: Secondary | ICD-10-CM | POA: Diagnosis not present

## 2018-03-09 DIAGNOSIS — M6281 Muscle weakness (generalized): Secondary | ICD-10-CM | POA: Diagnosis not present

## 2018-03-09 DIAGNOSIS — Z435 Encounter for attention to cystostomy: Secondary | ICD-10-CM | POA: Diagnosis not present

## 2018-03-11 DIAGNOSIS — F329 Major depressive disorder, single episode, unspecified: Secondary | ICD-10-CM | POA: Diagnosis not present

## 2018-03-11 DIAGNOSIS — G809 Cerebral palsy, unspecified: Secondary | ICD-10-CM | POA: Diagnosis not present

## 2018-03-11 DIAGNOSIS — M6281 Muscle weakness (generalized): Secondary | ICD-10-CM | POA: Diagnosis not present

## 2018-03-11 DIAGNOSIS — N319 Neuromuscular dysfunction of bladder, unspecified: Secondary | ICD-10-CM | POA: Diagnosis not present

## 2018-03-11 DIAGNOSIS — Z435 Encounter for attention to cystostomy: Secondary | ICD-10-CM | POA: Diagnosis not present

## 2018-03-11 DIAGNOSIS — L22 Diaper dermatitis: Secondary | ICD-10-CM | POA: Diagnosis not present

## 2018-03-17 DIAGNOSIS — Z435 Encounter for attention to cystostomy: Secondary | ICD-10-CM | POA: Diagnosis not present

## 2018-03-17 DIAGNOSIS — F329 Major depressive disorder, single episode, unspecified: Secondary | ICD-10-CM | POA: Diagnosis not present

## 2018-03-17 DIAGNOSIS — L22 Diaper dermatitis: Secondary | ICD-10-CM | POA: Diagnosis not present

## 2018-03-17 DIAGNOSIS — G809 Cerebral palsy, unspecified: Secondary | ICD-10-CM | POA: Diagnosis not present

## 2018-03-17 DIAGNOSIS — N319 Neuromuscular dysfunction of bladder, unspecified: Secondary | ICD-10-CM | POA: Diagnosis not present

## 2018-03-17 DIAGNOSIS — M6281 Muscle weakness (generalized): Secondary | ICD-10-CM | POA: Diagnosis not present

## 2018-03-23 ENCOUNTER — Telehealth: Payer: Self-pay | Admitting: Family Medicine

## 2018-03-23 NOTE — Telephone Encounter (Signed)
Raquel Sarna from [employer] would like a call back at [phone] regarding patient.

## 2018-03-24 NOTE — Telephone Encounter (Signed)
Notes faxed.

## 2018-03-28 ENCOUNTER — Other Ambulatory Visit: Payer: Self-pay

## 2018-03-28 DIAGNOSIS — G809 Cerebral palsy, unspecified: Secondary | ICD-10-CM

## 2018-03-29 DIAGNOSIS — M6281 Muscle weakness (generalized): Secondary | ICD-10-CM | POA: Diagnosis not present

## 2018-03-29 DIAGNOSIS — G809 Cerebral palsy, unspecified: Secondary | ICD-10-CM | POA: Diagnosis not present

## 2018-03-29 DIAGNOSIS — F329 Major depressive disorder, single episode, unspecified: Secondary | ICD-10-CM | POA: Diagnosis not present

## 2018-03-29 DIAGNOSIS — L22 Diaper dermatitis: Secondary | ICD-10-CM | POA: Diagnosis not present

## 2018-03-29 DIAGNOSIS — Z435 Encounter for attention to cystostomy: Secondary | ICD-10-CM | POA: Diagnosis not present

## 2018-03-29 DIAGNOSIS — N319 Neuromuscular dysfunction of bladder, unspecified: Secondary | ICD-10-CM | POA: Diagnosis not present

## 2018-03-30 ENCOUNTER — Telehealth: Payer: Self-pay | Admitting: Family Medicine

## 2018-03-30 NOTE — Telephone Encounter (Signed)
Left voicemail with sister

## 2018-03-30 NOTE — Telephone Encounter (Signed)
Paperwork for wheelchair says that patient has developed a stage THREE decubitus ulcer Please call them, find out who diagnosed that and who is he seeing for that If not seeing anyone, he needs to come in to see [place] pronto Benjamine Mola or me, first available)

## 2018-03-31 DIAGNOSIS — G809 Cerebral palsy, unspecified: Secondary | ICD-10-CM | POA: Diagnosis not present

## 2018-03-31 DIAGNOSIS — Z435 Encounter for attention to cystostomy: Secondary | ICD-10-CM | POA: Diagnosis not present

## 2018-03-31 DIAGNOSIS — N319 Neuromuscular dysfunction of bladder, unspecified: Secondary | ICD-10-CM | POA: Diagnosis not present

## 2018-03-31 DIAGNOSIS — F329 Major depressive disorder, single episode, unspecified: Secondary | ICD-10-CM | POA: Diagnosis not present

## 2018-03-31 DIAGNOSIS — M6281 Muscle weakness (generalized): Secondary | ICD-10-CM | POA: Diagnosis not present

## 2018-03-31 DIAGNOSIS — L22 Diaper dermatitis: Secondary | ICD-10-CM | POA: Diagnosis not present

## 2018-03-31 NOTE — Telephone Encounter (Signed)
Great, please update the wheelchair forms to say stage 1 and not stage 3 (huge difference) I'll sign once changed Thank you

## 2018-03-31 NOTE — Telephone Encounter (Signed)
[name] with [facility] is calling to state that the pt does not have a stage 3 ulcer, that the wound is more of a breakdown, but states it may have healed as she has not been seeing him for very long. She would like to speak with the CMA if possible to see if paperwork needs to be resubmitted. CB#: [phone]. Also requesting the niece to be notified.

## 2018-03-31 NOTE — Telephone Encounter (Signed)
He needs to have that wound looked at by home health ASAP and staged please

## 2018-03-31 NOTE — Telephone Encounter (Signed)
Was there today and just has some redness

## 2018-04-01 ENCOUNTER — Ambulatory Visit: Payer: Medicare Other | Admitting: Nurse Practitioner

## 2018-04-04 ENCOUNTER — Telehealth: Payer: Self-pay | Admitting: Family Medicine

## 2018-04-04 NOTE — Telephone Encounter (Signed)
Copied from [place] [phone]. Topic: General - Other >> Apr 04, 2018  3:48 PM Yvette Rack wrote: Reason for CRM: Brooke with [employer] returned call to [place]. Brooke requests a call back. Cb# [phone] Ext. [phone]

## 2018-04-05 NOTE — Telephone Encounter (Signed)
They state that this paperwork is for manual wheelchairs as well.  The paperwork is a PT evaluation.

## 2018-04-07 DIAGNOSIS — N319 Neuromuscular dysfunction of bladder, unspecified: Secondary | ICD-10-CM | POA: Diagnosis not present

## 2018-04-07 DIAGNOSIS — Z435 Encounter for attention to cystostomy: Secondary | ICD-10-CM | POA: Diagnosis not present

## 2018-04-07 DIAGNOSIS — G809 Cerebral palsy, unspecified: Secondary | ICD-10-CM | POA: Diagnosis not present

## 2018-04-07 DIAGNOSIS — L22 Diaper dermatitis: Secondary | ICD-10-CM | POA: Diagnosis not present

## 2018-04-07 DIAGNOSIS — F329 Major depressive disorder, single episode, unspecified: Secondary | ICD-10-CM | POA: Diagnosis not present

## 2018-04-07 DIAGNOSIS — M6281 Muscle weakness (generalized): Secondary | ICD-10-CM | POA: Diagnosis not present

## 2018-04-08 DIAGNOSIS — M6281 Muscle weakness (generalized): Secondary | ICD-10-CM | POA: Diagnosis not present

## 2018-04-08 DIAGNOSIS — Z435 Encounter for attention to cystostomy: Secondary | ICD-10-CM | POA: Diagnosis not present

## 2018-04-08 DIAGNOSIS — N319 Neuromuscular dysfunction of bladder, unspecified: Secondary | ICD-10-CM | POA: Diagnosis not present

## 2018-04-08 DIAGNOSIS — L22 Diaper dermatitis: Secondary | ICD-10-CM | POA: Diagnosis not present

## 2018-04-08 DIAGNOSIS — F329 Major depressive disorder, single episode, unspecified: Secondary | ICD-10-CM | POA: Diagnosis not present

## 2018-04-08 DIAGNOSIS — G809 Cerebral palsy, unspecified: Secondary | ICD-10-CM | POA: Diagnosis not present

## 2018-04-11 ENCOUNTER — Telehealth: Payer: Self-pay | Admitting: Family Medicine

## 2018-04-11 DIAGNOSIS — M6281 Muscle weakness (generalized): Secondary | ICD-10-CM | POA: Diagnosis not present

## 2018-04-11 DIAGNOSIS — Z435 Encounter for attention to cystostomy: Secondary | ICD-10-CM | POA: Diagnosis not present

## 2018-04-11 DIAGNOSIS — F329 Major depressive disorder, single episode, unspecified: Secondary | ICD-10-CM | POA: Diagnosis not present

## 2018-04-11 DIAGNOSIS — L22 Diaper dermatitis: Secondary | ICD-10-CM | POA: Diagnosis not present

## 2018-04-11 DIAGNOSIS — G809 Cerebral palsy, unspecified: Secondary | ICD-10-CM

## 2018-04-11 DIAGNOSIS — N319 Neuromuscular dysfunction of bladder, unspecified: Secondary | ICD-10-CM | POA: Diagnosis not present

## 2018-04-11 NOTE — Telephone Encounter (Signed)
Copied from [place] [id number]. Topic: Quick Communication - See Telephone Encounter >> Apr 11, 2018  1:18 PM Burchel, Abbi R wrote: CRM for notification. See Telephone encounter for: 04/11/18.  [name] ([facility]) [phone] requesting an order for chair for use to/from and in pt's shower. (ie: Shower Buddy [employer]) with a durable medical supply company.   [name] also requesting OT for 1x9wk for caregiver education on new equipment  and transfer.   Please call [name] when orders for chair are placed.

## 2018-04-11 NOTE — Telephone Encounter (Signed)
Please order and I'll sign Yes, okay for OT

## 2018-04-13 NOTE — Telephone Encounter (Signed)
Verbal order given  

## 2018-04-29 ENCOUNTER — Other Ambulatory Visit: Payer: Medicare Other

## 2018-04-29 ENCOUNTER — Ambulatory Visit: Payer: Medicare Other | Admitting: Oncology

## 2018-04-29 NOTE — Telephone Encounter (Signed)
Lisa notified sent order and they received to [facility]

## 2018-04-29 NOTE — Telephone Encounter (Signed)
Gerald Montgomery is calling to check the status of the shower chair. Please Advise. [phone]

## 2018-05-06 NOTE — Telephone Encounter (Signed)
Re to [facility]

## 2018-05-06 NOTE — Telephone Encounter (Signed)
[name] states that [employer] never received the order for the shower chair for this pt. She is requesting this to be re-faxed to [phone]. CB#: [phone]

## 2018-05-06 NOTE — Telephone Encounter (Signed)
Name of Shower Chair: [employer] tilt inspace shower chair

## 2018-05-06 NOTE — Addendum Note (Signed)
Addended by: Docia Furl on: 05/06/2018 02:37 PM   Modules accepted: Orders

## 2018-05-09 ENCOUNTER — Other Ambulatory Visit: Payer: Self-pay | Admitting: Family Medicine

## 2018-05-09 MED ORDER — NYSTATIN 100000 UNIT/GM EX POWD: Freq: Three times a day (TID) | CUTANEOUS | 5 refills | Status: DC

## 2018-05-09 NOTE — Telephone Encounter (Signed)
Copied from [place] [id number]. Topic: Quick Communication - Rx Refill/Question >> May 09, 2018  9:41 AM Yvette Rack wrote: Medication: nystatin (MYCOSTATIN/NYSTOP) powder  Has the patient contacted their pharmacy? no  Preferred Pharmacy (with phone number or street name): [facility] [place], [place]. [phone] (Phone)   [phone] (Fax)  Agent: Please be advised that RX refills may take up to 3 business days. We ask that you follow-up with your pharmacy.

## 2018-05-09 NOTE — Telephone Encounter (Signed)
Requested medication (s) are due for refill today: yES  Requested medication (s) are on the active medication list: yES  Last refill:  02/18/18  Future visit scheduled: nO  Notes to clinic:See request    Requested Prescriptions  Pending Prescriptions Disp Refills   nystatin (MYCOSTATIN/NYSTOP) powder 30 g 5    Sig: Apply topically 3 (three) times daily. To affected rash     There is no refill protocol information for this order

## 2018-05-24 ENCOUNTER — Other Ambulatory Visit: Payer: Self-pay | Admitting: Urology

## 2018-05-24 NOTE — Telephone Encounter (Signed)
Per Epic pt reported not taking on 02/18/18 for [place].

## 2018-05-28 IMAGING — CT CT ABD-PEL WO/W CM
2 of 10 series · 10 of 46 positions shown, 16 images · IV contrast (isovue)
Comparison: CT 10/09/2009.

CLINICAL DATA: Gross hematuria. Frequent urinary tract infections.
History of neurogenic bladder.

EXAM:
CT ABDOMEN AND PELVIS WITHOUT AND WITH CONTRAST
TECHNIQUE: Multidetector CT imaging of the abdomen and pelvis was performed
following the standard protocol before and following the bolus
administration of intravenous contrast.
CONTRAST:  100 ml Isovue 370.

[Series 4: soft tissue post · axial · 0.65mm/px · z∈[-431,-1]mm · 8 of 112 slices shown, 13 images]
[im 13/112  soft-tissue]
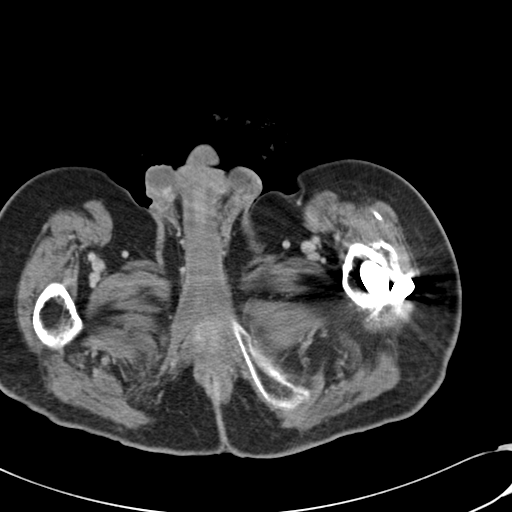
[im 13/112  bone]
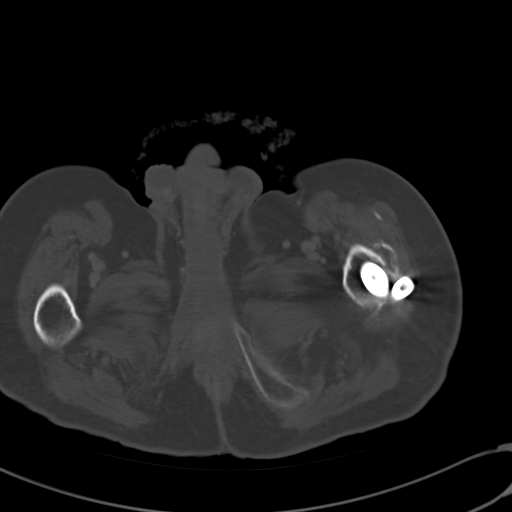
[im 25/112  soft-tissue]
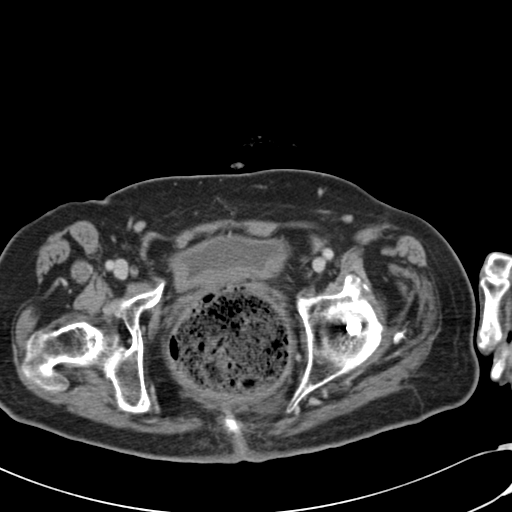
[im 38/112  soft-tissue]
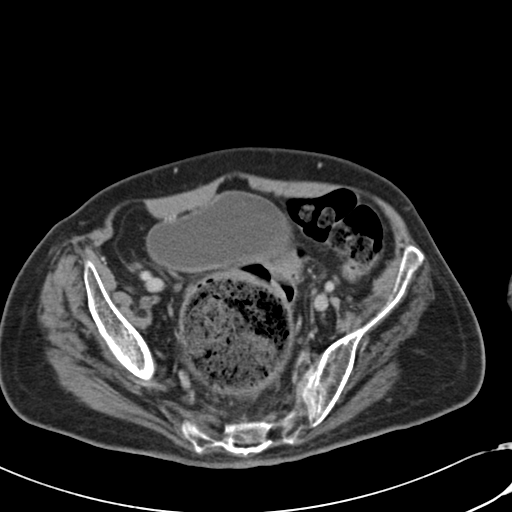
[im 50/112  soft-tissue]
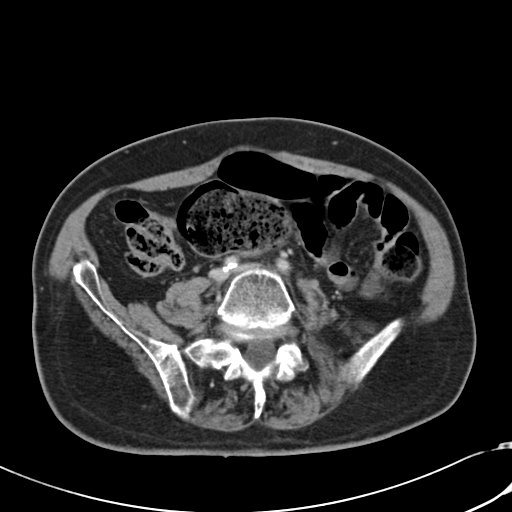
[im 62/112  soft-tissue]
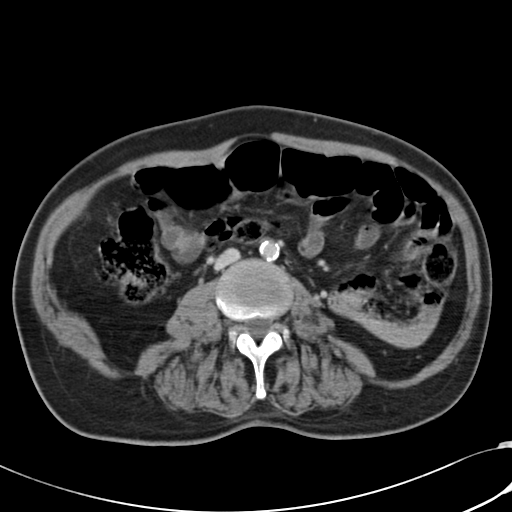
[im 62/112  lung]
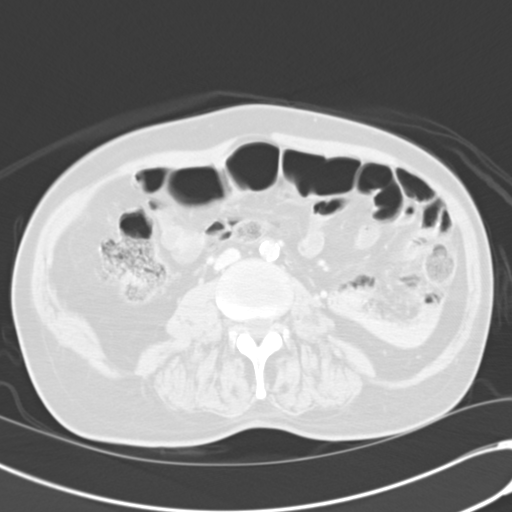
[im 75/112  soft-tissue]
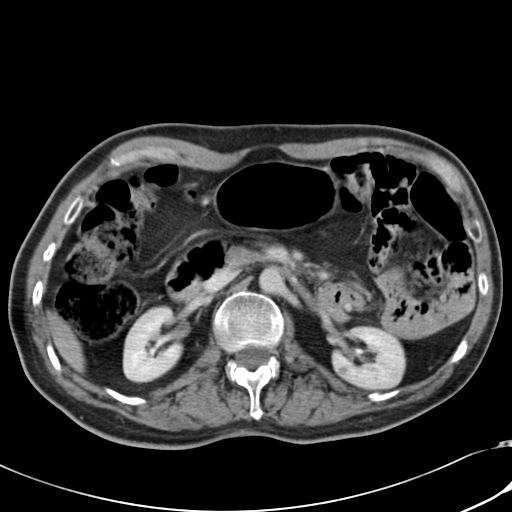
[im 75/112  lung]
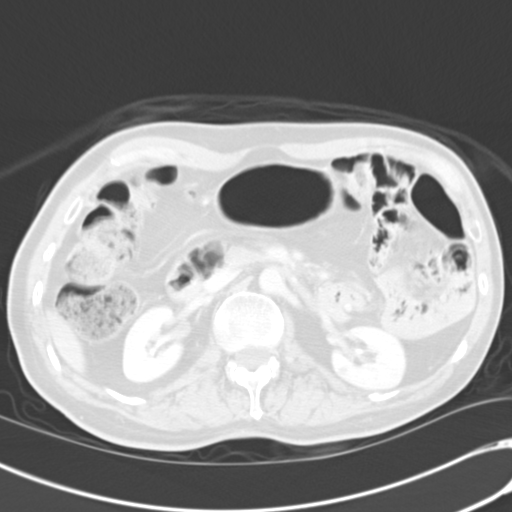
[im 87/112  soft-tissue]
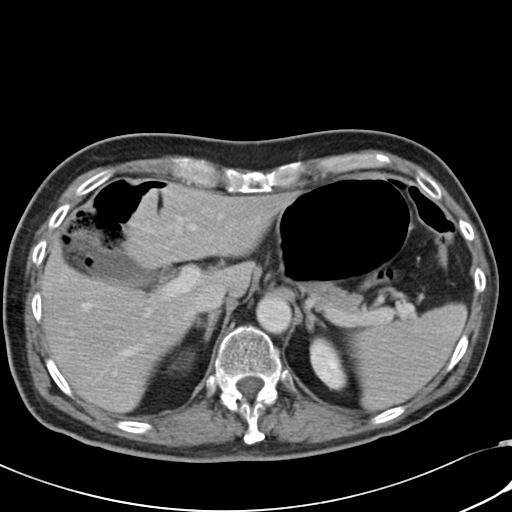
[im 87/112  lung]
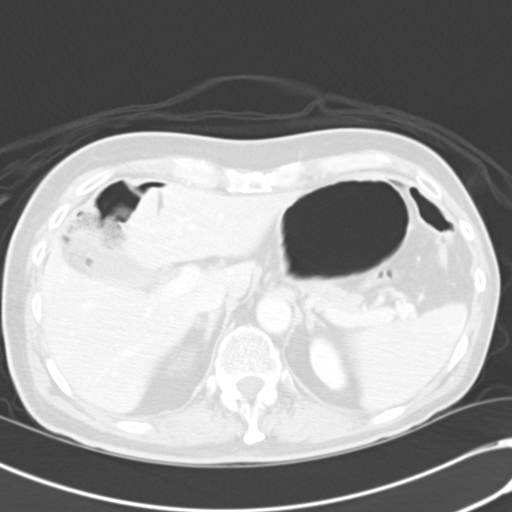
[im 99/112  soft-tissue]
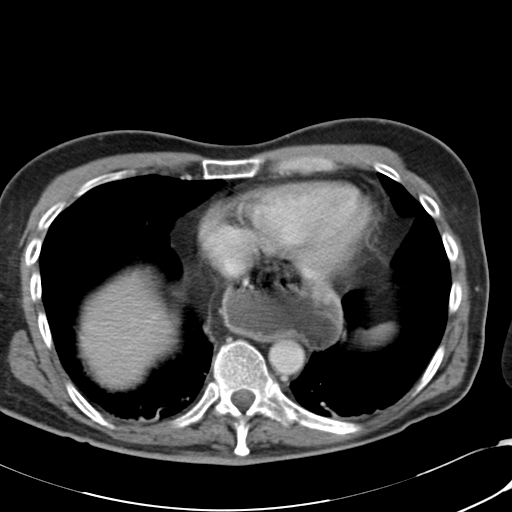
[im 99/112  lung]
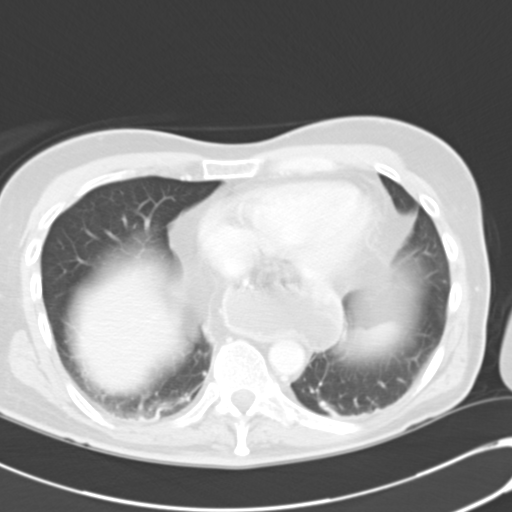

[Series 7: coronal post · coronal · 0.66mm/px · 2 of 114 slices shown, 3 images]
[im 38/114  soft-tissue]
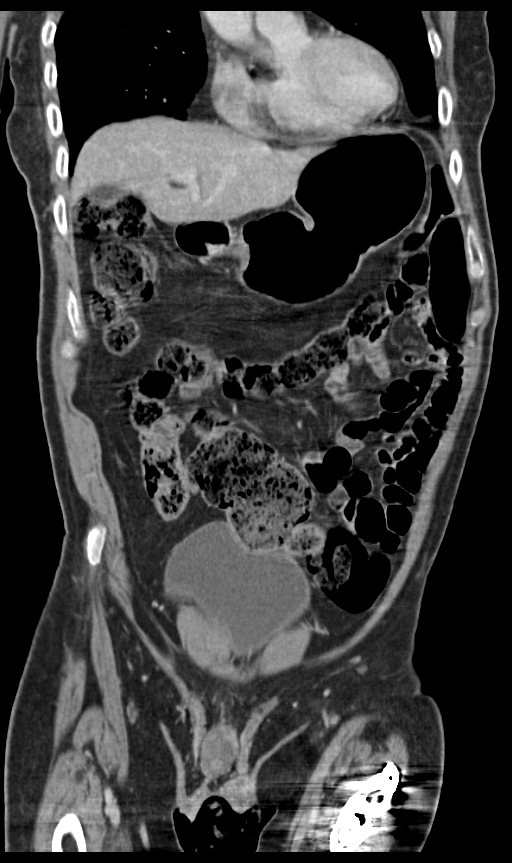
[im 38/114  bone]
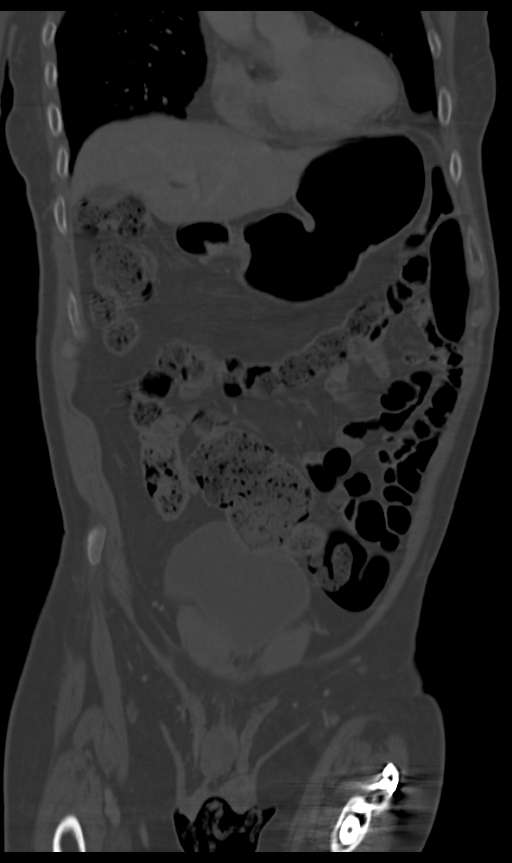
[im 76/114  soft-tissue]
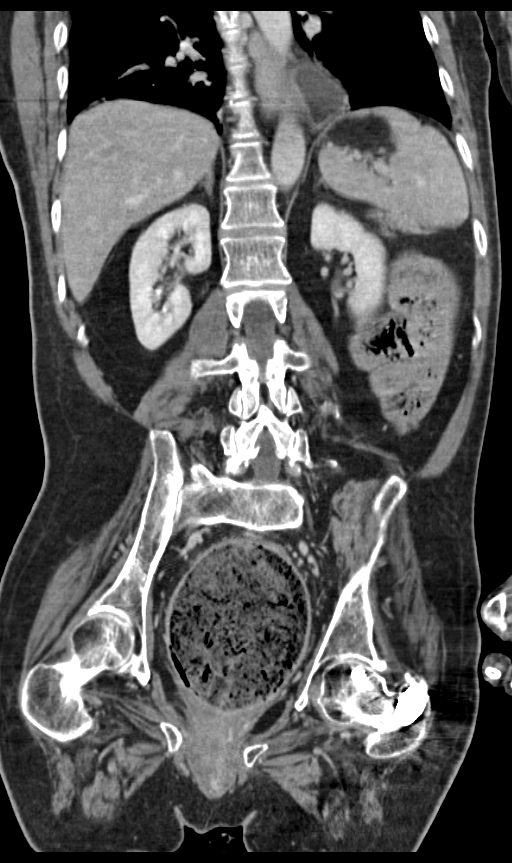

[10 of 46 positions shown; findings below may reference images not displayed]

FINDINGS: Lower chest: Stable mild dependent atelectasis at both lung bases.
There is a moderate size hiatal hernia. No significant pleural or
pericardial effusion.

Hepatobiliary: The liver appears stable without suspicious findings.
No evidence of gallstones, gallbladder wall thickening or biliary
dilatation.

Pancreas: Unremarkable. No pancreatic ductal dilatation or
surrounding inflammatory changes.

Spleen: Normal in size without focal abnormality.

Adrenals/Urinary Tract: Both adrenal glands appear normal.
Pre-contrast images demonstrate no renal, ureteral or bladder
calculi. Post-contrast, both kidneys enhance normally. There is no
evidence of enhancing renal mass. There are sub cm low-density
lesions in both kidneys which are too small to optimally
characterize, but similar to the prior study and likely all cysts.
Delayed images result in segmental visualization of the ureters. No
urothelial abnormalities are identified. The bladder is distended
with mild wall thickening. There is a 1.3 cm bladder diverticulum on
the left. No focal mucosal abnormalities are seen.

Stomach/Bowel: As above, there is a moderate size hiatal hernia. The
stomach and small bowel otherwise appear normal. There is large
amount of stool throughout the colon, especially within the rectum
which is markedly distended. There is some perirectal soft tissue
stranding without focal fluid collection or significant wall
thickening.

Vascular/Lymphatic: There are no enlarged abdominal or pelvic lymph
nodes. Aortic and branch vessel atherosclerosis noted.

Reproductive: The prostate gland and seminal vesicles demonstrate no
significant findings.

Other: The anterior abdominal wall appears stable.  No ascites.

Musculoskeletal: No acute or significant osseous findings. There is
posttraumatic deformity of the proximal left femur status post
interval dynamic screw and plate ORIF. There are bilateral L5 pars
defects with mild biforaminal narrowing at L5-S1.
IMPRESSION: 1. No evidence of urinary tract calculus or hydronephrosis.
2. No evidence of complicated urinary tract infection. The bladder
is distended with wall thickening and small diverticula, consistent
with neurogenic bladder.
3. No suspicious renal findings. Stable small low density renal
lesions bilaterally, likely cysts.
4. Moderate size hiatal hernia. Probable fecal impaction of the
rectum with perirectal soft tissue stranding.
5. Aortic atherosclerosis.
6. Bilateral L5 pars defects.

## 2018-06-09 ENCOUNTER — Telehealth: Payer: Self-pay | Admitting: Family Medicine

## 2018-06-09 NOTE — Telephone Encounter (Signed)
Copied from [place] [id number]. Topic: Quick Communication - Home Health Verbal Orders >> Jun 09, 2018  1:00 PM Percell Belt A wrote: Caller/Agency: [name] with [facility]  Callback Number: [phone] Requesting OT/PT/Skilled Nursing/Social Work: Need verbals for OT  Frequency:  1 week 5

## 2018-06-09 NOTE — Telephone Encounter (Signed)
Education on custom wheelchair that is arriving, for proper usage

## 2018-06-09 NOTE — Telephone Encounter (Signed)
Please document the reason, and that's fine

## 2018-06-27 ENCOUNTER — Other Ambulatory Visit: Payer: Medicare Other | Admitting: Urology

## 2018-07-05 ENCOUNTER — Other Ambulatory Visit: Payer: Self-pay | Admitting: Family Medicine

## 2018-07-05 MED ORDER — FESOTERODINE FUMARATE ER 8 MG PO TB24
8.0000 mg | ORAL_TABLET | Freq: Every day | ORAL | 6 refills | Status: DC
Start: 2018-07-05 — End: 2019-04-03

## 2018-07-08 ENCOUNTER — Encounter: Payer: Self-pay | Admitting: Urology

## 2018-07-08 ENCOUNTER — Ambulatory Visit (INDEPENDENT_AMBULATORY_CARE_PROVIDER_SITE_OTHER): Payer: Medicare Other | Admitting: Urology

## 2018-07-08 VITALS — BP 143/79 | HR 81

## 2018-07-08 DIAGNOSIS — R339 Retention of urine, unspecified: Secondary | ICD-10-CM | POA: Diagnosis not present

## 2018-07-08 DIAGNOSIS — N390 Urinary tract infection, site not specified: Secondary | ICD-10-CM

## 2018-07-08 MED ORDER — LIDOCAINE HCL URETHRAL/MUCOSAL 2 % EX GEL: 1.0000 "application " | Freq: Once | CUTANEOUS | Status: AC

## 2018-07-08 NOTE — Progress Notes (Signed)
   07/08/18  CC:  Chief Complaint  Patient presents with  . Cysto    HPI: 63 year old male with a neurogenic bladder secondary to cerebral palsy managed with an indwelling SP tube.  He presents for annual surveillance cystoscopy.  Blood pressure (!) 143/79, pulse 81. NED. A&Ox3.   No respiratory distress     Cystoscopy Procedure Note  Patient identification was confirmed, informed consent was obtained, and patient was prepped using Betadine solution.   Procedure: The flexible cystoscope was introduced through the suprapubic site - No urethral strictures/lesions are present. - Bilateral ureteral orifices identified - Bladder mucosa  reveals mild erythema secondary to the indwelling SP tube.  No solid or papillary lesions noted. - No bladder stones -Moderate trabeculation   Post-Procedure: - Patient tolerated the procedure well  Assessment/ Plan: His SP tube is changed by home health and he requires every 6 month office visits.  Return in about 6 months (around 01/06/2019) for Recheck with shannon.   Abbie Sons, MD

## 2018-07-14 NOTE — Telephone Encounter (Signed)
[name] would like to extend OT  Wants to extend until wheelchair arrives   1x5  [phone]

## 2018-07-15 NOTE — Telephone Encounter (Signed)
Does the note mean 1 times a week for 5 weeks? If so, that's fine Okay to extend OT

## 2018-07-15 NOTE — Telephone Encounter (Signed)
Left verbal order

## 2018-07-29 ENCOUNTER — Other Ambulatory Visit: Payer: Self-pay | Admitting: Family Medicine

## 2018-07-29 DIAGNOSIS — Z86718 Personal history of other venous thrombosis and embolism: Secondary | ICD-10-CM

## 2018-07-29 DIAGNOSIS — F39 Unspecified mood [affective] disorder: Secondary | ICD-10-CM

## 2018-07-29 NOTE — Telephone Encounter (Signed)
Not due til 4/15

## 2018-08-05 ENCOUNTER — Other Ambulatory Visit: Payer: Self-pay

## 2018-08-05 ENCOUNTER — Ambulatory Visit (INDEPENDENT_AMBULATORY_CARE_PROVIDER_SITE_OTHER): Payer: Medicare Other

## 2018-08-05 ENCOUNTER — Telehealth: Payer: Self-pay | Admitting: Family Medicine

## 2018-08-05 VITALS — BP 120/82 | HR 80 | Temp 97.4°F | Resp 16 | Ht 66.0 in

## 2018-08-05 DIAGNOSIS — L209 Atopic dermatitis, unspecified: Secondary | ICD-10-CM

## 2018-08-05 DIAGNOSIS — Z0000 Encounter for general adult medical examination without abnormal findings: Secondary | ICD-10-CM

## 2018-08-05 DIAGNOSIS — R21 Rash and other nonspecific skin eruption: Secondary | ICD-10-CM

## 2018-08-05 MED ORDER — HYDROCORTISONE 1 % EX OINT: 1.0000 "application " | TOPICAL_OINTMENT | Freq: Every day | CUTANEOUS | 0 refills | Status: DC

## 2018-08-05 NOTE — Patient Instructions (Addendum)
Please use hydrocortisone cream once daily on face and neck. Use thin layer and avoid eyes. Use this sparingly as needed for severe itching and redness. Keep well moisturized. If it does not resolve or worsens please let us know and we can refer you to dermatology. Using steroid creams for too long can thin skin so use it the least amount as needed.     [name] , Thank you for taking time to come for your Medicare Wellness Visit. I appreciate your ongoing commitment to your health goals. Please review the following plan we discussed and let me know if I can assist you in the future.   Screening recommendations/referrals: Colonoscopy: done 12/27/17. Repeat in 10 years.  Recommended yearly ophthalmology/optometry visit for glaucoma screening and checkup Recommended yearly dental visit for hygiene and checkup  Vaccinations: Influenza vaccine: done 02/18/18 Pneumococcal vaccine: postponed Tdap vaccine: postponed Shingles vaccine: Shingrix discussed. Please contact your pharmacy for coverage information.   Advanced directives: Please bring a copy of your health care power of attorney and living will to the office at your convenience.  Next appointment: Please follow up in one year for your Medicare Annual Wellness visit.    Preventive Care 40-64 Years, Male Preventive care refers to lifestyle choices and visits with your health care provider that can promote health and wellness. What does preventive care include?  A yearly physical exam. This is also called an annual well check.  Dental exams once or twice a year.  Routine eye exams. Ask your health care provider how often you should have your eyes checked.  Personal lifestyle choices, including:  Daily care of your teeth and gums.  Regular physical activity.  Eating a healthy diet.  Avoiding tobacco and drug use.  Limiting alcohol use.  Practicing safe sex.  Taking low-dose aspirin every day starting at age 76. What  happens during an annual well check? The services and screenings done by your health care provider during your annual well check will depend on your age, overall health, lifestyle risk factors, and family history of disease. Counseling  Your health care provider may ask you questions about your:  Alcohol use.  Tobacco use.  Drug use.  Emotional well-being.  Home and relationship well-being.  Sexual activity.  Eating habits.  Work and work Statistician. Screening  You may have the following tests or measurements:  Height, weight, and BMI.  Blood pressure.  Lipid and cholesterol levels. These may be checked every 5 years, or more frequently if you are over 56 years old.  Skin check.  Lung cancer screening. You may have this screening every year starting at age 58 if you have a 30-pack-year history of smoking and currently smoke or have quit within the past 15 years.  Fecal occult blood test (FOBT) of the stool. You may have this test every year starting at age 68.  Flexible sigmoidoscopy or colonoscopy. You may have a sigmoidoscopy every 5 years or a colonoscopy every 10 years starting at age 29.  Prostate cancer screening. Recommendations will vary depending on your family history and other risks.  Hepatitis C blood test.  Hepatitis B blood test.  Sexually transmitted disease (STD) testing.  Diabetes screening. This is done by checking your blood sugar (glucose) after you have not eaten for a while (fasting). You may have this done every 1-3 years. Discuss your test results, treatment options, and if necessary, the need for more tests with your health care provider. Vaccines  Your health care  provider may recommend certain vaccines, such as:  Influenza vaccine. This is recommended every year.  Tetanus, diphtheria, and acellular pertussis (Tdap, Td) vaccine. You may need a Td booster every 10 years.  Zoster vaccine. You may need this after age 16.  Pneumococcal  13-valent conjugate (PCV13) vaccine. You may need this if you have certain conditions and have not been vaccinated.  Pneumococcal polysaccharide (PPSV23) vaccine. You may need one or two doses if you smoke cigarettes or if you have certain conditions. Talk to your health care provider about which screenings and vaccines you need and how often you need them. This information is not intended to replace advice given to you by your health care provider. Make sure you discuss any questions you have with your health care provider. Document Released: 06/07/2015 Document Revised: 01/29/2016 Document Reviewed: 03/12/2015 Elsevier Interactive Patient Education  2017 [place] Prevention in the Home Falls can cause injuries. They can happen to people of all ages. There are many things you can do to make your home safe and to help prevent falls. What can I do on the outside of my home?  Regularly fix the edges of walkways and driveways and fix any cracks.  Remove anything that might make you trip as you walk through a door, such as a raised step or threshold.  Trim any bushes or trees on the path to your home.  Use bright outdoor lighting.  Clear any walking paths of anything that might make someone trip, such as rocks or tools.  Regularly check to see if handrails are loose or broken. Make sure that both sides of any steps have handrails.  Any raised decks and porches should have guardrails on the edges.  Have any leaves, snow, or ice cleared regularly.  Use sand or salt on walking paths during winter.  Clean up any spills in your garage right away. This includes oil or grease spills. What can I do in the bathroom?  Use night lights.  Install grab bars by the toilet and in the tub and shower. Do not use towel bars as grab bars.  Use non-skid mats or decals in the tub or shower.  If you need to sit down in the shower, use a plastic, non-slip stool.  Keep the floor dry. Clean  up any water that spills on the floor as soon as it happens.  Remove soap buildup in the tub or shower regularly.  Attach bath mats securely with double-sided non-slip rug tape.  Do not have throw rugs and other things on the floor that can make you trip. What can I do in the bedroom?  Use night lights.  Make sure that you have a light by your bed that is easy to reach.  Do not use any sheets or blankets that are too big for your bed. They should not hang down onto the floor.  Have a firm chair that has side arms. You can use this for support while you get dressed.  Do not have throw rugs and other things on the floor that can make you trip. What can I do in the kitchen?  Clean up any spills right away.  Avoid walking on wet floors.  Keep items that you use a lot in easy-to-reach places.  If you need to reach something above you, use a strong step stool that has a grab bar.  Keep electrical cords out of the way.  Do not use floor polish or wax that  makes floors slippery. If you must use wax, use non-skid floor wax.  Do not have throw rugs and other things on the floor that can make you trip. What can I do with my stairs?  Do not leave any items on the stairs.  Make sure that there are handrails on both sides of the stairs and use them. Fix handrails that are broken or loose. Make sure that handrails are as long as the stairways.  Check any carpeting to make sure that it is firmly attached to the stairs. Fix any carpet that is loose or worn.  Avoid having throw rugs at the top or bottom of the stairs. If you do have throw rugs, attach them to the floor with carpet tape.  Make sure that you have a light switch at the top of the stairs and the bottom of the stairs. If you do not have them, ask someone to add them for you. What else can I do to help prevent falls?  Wear shoes that:  Do not have high heels.  Have rubber bottoms.  Are comfortable and fit you well.  Are  closed at the toe. Do not wear sandals.  If you use a stepladder:  Make sure that it is fully opened. Do not climb a closed stepladder.  Make sure that both sides of the stepladder are locked into place.  Ask someone to hold it for you, if possible.  Clearly mark and make sure that you can see:  Any grab bars or handrails.  First and last steps.  Where the edge of each step is.  Use tools that help you move around (mobility aids) if they are needed. These include:  Canes.  Walkers.  Scooters.  Crutches.  Turn on the lights when you go into a dark area. Replace any light bulbs as soon as they burn out.  Set up your furniture so you have a clear path. Avoid moving your furniture around.  If any of your floors are uneven, fix them.  If there are any pets around you, be aware of where they are.  Review your medicines with your doctor. Some medicines can make you feel dizzy. This can increase your chance of falling. Ask your doctor what other things that you can do to help prevent falls. This information is not intended to replace advice given to you by your health care provider. Make sure you discuss any questions you have with your health care provider. Document Released: 03/07/2009 Document Revised: 10/17/2015 Document Reviewed: 06/15/2014 Elsevier Interactive Patient Education  2017 [employer].

## 2018-08-05 NOTE — Telephone Encounter (Signed)
It's difficult for patient to get to the office, so we'll cancel my earlier request for him to come back  [place], please send order for hand brace to OT to fit him for that; see Kasey's note in his Medicare Wellness Visit  I see that Gerald Montgomery actually saw him for the facial rash I'll refer to [facility], as this sounds like chronic seborrheic dermatitis  I'll send GI complaint, PPI request to his GI doctor in case an EGD is needed or worth considering again, last EGD Sept 2018, especially given his hx of anemia  Dr. Marius Ditch, Here is the note from LPN: "Patient has been taking OTC nexium for acid reflux due to failing prilosec in the past and family requests prescription for nexium due to cost of OTC meds. His sister doesn't think the OTC dosage is strong enough and he may need to increase to prescription strength. Please advise. Thank you!"

## 2018-08-05 NOTE — Telephone Encounter (Signed)
Can we get pt appt

## 2018-08-05 NOTE — Telephone Encounter (Signed)
-----   Message from Clemetine Marker, LPN sent at 02/20/5746 12:41 PM EDT ----- Regarding: nurse notes Please see nurse notes for the following:  1 - dry, red, itchy, flaky skin on face and neck  2 - request for brace for left hand  3 - request for rx for nexium  Let me know if you have any questions, thank you!  Gerald Montgomery

## 2018-08-05 NOTE — Progress Notes (Signed)
Subjective:   Gerald Montgomery is a 63 y.o. male who presents for an Initial Medicare Annual Wellness Visit.  Review of Systems    Cardiac Risk Factors include: advanced age (>65men, >54 women);male gender;sedentary lifestyle    Objective:    Today's Vitals   08/05/18 1133  BP: 120/82  Pulse: 80  Resp: 16  Temp: (!) 97.4 F (36.3 C)  TempSrc: Oral  SpO2: 98%  Height: 5\' 6"  (1.676 m)   Body mass index is 19.14 kg/m.  Advanced Directives 08/05/2018 12/27/2017 10/28/2017 08/24/2017 07/07/2017 06/07/2017 06/04/2017  Does Patient Have a Medical Advance Directive? Yes No No No Yes Yes Yes  Type of Academic librarian - - - Clinical cytogeneticist of [employer] Power of Attorney  Does patient want to make changes to medical advance directive? - - No - Patient declined No - Patient declined No - Patient declined - No - Patient declined  Copy of [place] in Chart? Yes - validated most recent copy scanned in chart (See row information) - No - copy requested No - copy requested No - copy requested No - copy requested No - copy requested  Would patient like information on creating a medical advance directive? - No - Patient declined No - Patient declined No - Patient declined No - Patient declined - No - Patient declined    Current Medications (verified) Outpatient Encounter Medications as of 08/05/2018  Medication Sig   acetaminophen (TYLENOL) 325 MG tablet Take 1 tablet (325 mg total) by mouth every 6 (six) hours as needed for mild pain (or Fever >/= 101).   ARIPiprazole (ABILIFY) 15 MG tablet TAKE 1 TABLET(15 MG) BY MOUTH DAILY   bisacodyl (DULCOLAX) 5 MG EC tablet Take 1 tablet (5 mg total) by mouth daily as needed for moderate constipation.   esomeprazole (NEXIUM) 20 MG capsule Take 20 mg by mouth daily at 12 noon. OTC   fesoterodine (TOVIAZ) 8 MG TB24 tablet Take 1 tablet (8 mg total) by mouth daily.    Hydrocortisone (GERHARDT'S BUTT CREAM) CREA Apply 1 application topically 4 (four) times daily.   rivaroxaban (XARELTO) 10 MG TABS tablet TAKE 1 TABLET(10 MG) BY MOUTH DAILY   nystatin (MYCOSTATIN/NYSTOP) powder Apply topically 3 (three) times daily. To affected rash (Patient not taking: Reported on 08/05/2018)   [DISCONTINUED] Omeprazole (PRILOSEC PO) Take by mouth as needed.   Facility-Administered Encounter Medications as of 08/05/2018  Medication   lidocaine (XYLOCAINE) 2 % jelly 1 application    Allergies (verified) Macrobid [nitrofurantoin]; Penicillins; and Sulfa antibiotics   History: Past Medical History:  Diagnosis Date   Cerebral palsy ([place])    Depression    GERD (gastroesophageal reflux disease)    History of DVT of lower extremity    Mood disorder ([facility])    Neurogenic bladder    Urinary incontinence    Past Surgical History:  Procedure Laterality Date   COLONOSCOPY WITH PROPOFOL N/A 12/27/2017   Procedure: COLONOSCOPY WITH PROPOFOL;  Surgeon: Lin Landsman, MD;  Location: [facility] ENDOSCOPY;  Service: Gastroenterology;  Laterality: N/A;   ESOPHAGOGASTRODUODENOSCOPY Left 02/07/2017   Procedure: ESOPHAGOGASTRODUODENOSCOPY (EGD);  Surgeon: Thornton Park, MD;  Location: [place];  Service: Gastroenterology;  Laterality: Left;   ESOPHAGOGASTRODUODENOSCOPY (EGD) WITH PROPOFOL  12/27/2017   Procedure: ESOPHAGOGASTRODUODENOSCOPY (EGD) WITH PROPOFOL;  Surgeon: Lin Landsman, MD;  Location: [facility] ENDOSCOPY;  Service: Gastroenterology;;   filter surgery for blood clot  2013  HIP FRACTURE SURGERY  2013   IR CATHETER TUBE CHANGE  07/07/2017   Family History  Problem Relation Age of Onset   Heart disease Mother    Atrial fibrillation Mother    Diabetes Mother        Borderline   Heart disease Father        Passed away of heart attack   Kidney disease Neg Hx    Prostate cancer Neg Hx    Social History   Socioeconomic History   Marital  status: Single    Spouse name: Not on file   Number of children: Not on file   Years of education: Not on file   Highest education level: Not on file  Occupational History   Occupation: disabled  Social Designer, fashion/clothing strain: Not hard at all   Food insecurity:    Worry: Never true    Inability: Never true   Transportation needs:    Medical: No    Non-medical: No  Tobacco Use   Smoking status: Never Smoker   Smokeless tobacco: Never Used  Substance and Sexual Activity   Alcohol use: No    Alcohol/week: 0.0 standard drinks   Drug use: No   Sexual activity: Never  Lifestyle   Physical activity:    Days per week: 0 days    Minutes per session: 0 min   Stress: Not at all  Relationships   Social connections:    Talks on phone: More than three times a week    Gets together: More than three times a week    Attends religious service: Never    Active member of club or organization: No    Attends meetings of clubs or organizations: Never    Relationship status: Never married  Other Topics Concern   Not on file  Social History Narrative   Not on file   Tobacco Counseling Counseling given: Not Answered   Clinical Intake:  Pre-visit preparation completed: Yes  Pain : No/denies pain     Nutritional Risks: None Diabetes: No  How often do you need to have someone help you when you read instructions, pamphlets, or other written materials from your doctor or pharmacy?: 1 - Never  Interpreter Needed?: No  Information entered by :: Clemetine Marker LPN  Activities of Daily Living In your present state of health, do you have any difficulty performing the following activities: 08/05/2018 02/18/2018  Hearing? N Y  Comment declines hearing aids -  Vision? N N  Difficulty concentrating or making decisions? N N  Walking or climbing stairs? Y Y  Dressing or bathing? Y Y  Doing errands, shopping? Tempie Donning  Preparing Food and eating ? Y -  Using the Toilet?  (No Data) -  Comment suprapubic catheter -  In the past six months, have you accidently leaked urine? Y -  Do you have problems with loss of bowel control? Y -  Managing your Medications? Y -  Managing your Finances? Y -  Housekeeping or managing your Housekeeping? Y -  Some recent data might be hidden     Immunizations and Health Maintenance Immunization History  Administered Date(s) Administered   Influenza,inj,Quad PF,6+ Mos 03/04/2016, 02/06/2017, 02/18/2018   Health Maintenance Due  Topic Date Due   Hepatitis C Screening  [birth year]   TETANUS/TDAP  01/29/1976    Patient Care Team: Arnetha Courser, MD as PCP - General (Family Medicine) Nickie Retort, MD as Consulting Physician (Urology)  Indicate  any recent Medical Services you may have received from other than [facility] providers in the past year (date may be approximate).    Assessment:   This is a routine wellness examination for [place].  Hearing/Vision screen Hearing Screening Comments: Pt denies hearing difficulty Vision Screening Comments: Not established with eye provider  Dietary issues and exercise activities discussed: Current Exercise Habits: The patient does not participate in regular exercise at present, Exercise limited by: orthopedic condition(s);neurologic condition(s)  Goals   None    Depression Screen PHQ 2/9 Scores 08/05/2018 02/18/2018 08/03/2017 05/31/2017  PHQ - 2 Score 0 0 0 0  PHQ- 9 Score - 0 - -    Fall Risk Fall Risk  08/05/2018 02/18/2018 08/03/2017 05/31/2017 03/25/2017  Falls in the past year? 0 No No No No  Number falls in past yr: 0 - - - -  Injury with Fall? 0 - - - -  Risk for fall due to : - - - - -  Follow up Falls evaluation completed - - - -    FALL RISK PREVENTION PERTAINING TO THE HOME:  Any stairs in or around the home? No  If so, do they handrails? No   Home free of loose throw rugs in walkways, pet beds, electrical cords, etc? Yes  Adequate lighting in your home to  reduce risk of falls? Yes   ASSISTIVE DEVICES UTILIZED TO PREVENT FALLS:  Life alert? Yes Use of a cane, walker or w/c? Yes  Grab bars in the bathroom? No  Shower chair or bench in shower? No  Elevated toilet seat or a handicapped toilet? No   DME ORDERS:  DME order needed?  Yes   - brace for left hand  TIMED UP AND GO:  Was the test performed? No . Patient remained in wheelchair Length of time to ambulate 10 feet:  sec.    Education: Fall risk prevention has been discussed.  Intervention(s) required? No    Cognitive Function: 6CIT not performed      Screening Tests Health Maintenance  Topic Date Due   Hepatitis C Screening  July 10, 1955   TETANUS/TDAP  01/29/1976   COLONOSCOPY  12/28/2027   INFLUENZA VACCINE  Completed   HIV Screening  Completed    Qualifies for Shingles Vaccine? Yes  . Due for Shingrix. Education has been provided regarding the importance of this vaccine. Pt has been advised to call insurance company to determine out of pocket expense. Advised may also receive vaccine at local pharmacy or Health Dept. Verbalized acceptance and understanding.  Tdap: Although this vaccine is not a covered service during a Wellness Exam, does the patient still wish to receive this vaccine today?  No .  Education has been provided regarding the importance of this vaccine. Advised may receive this vaccine at local pharmacy or Health Dept. Aware to provide a copy of the vaccination record if obtained from local pharmacy or Health Dept. Verbalized acceptance and understanding.  Flu Vaccine: Up to date  Pneumococcal Vaccine: Due for Pneumococcal vaccine. Does the patient want to receive this vaccine today?  No . Education has been provided regarding the importance of this vaccine but still declined. Advised may receive this vaccine at local pharmacy or Health Dept. Aware to provide a copy of the vaccination record if obtained from local pharmacy or Health Dept. Verbalized  acceptance and understanding.  Cancer Screenings:  Colorectal Screening: Completed 12/27/17. Repeat every 10 years  Lung Cancer Screening: (Low Dose CT Chest recommended if Age  55-80 years, 30 pack-year currently smoking OR have quit w/in 15years.) does not qualify.   Additional Screening:  Hepatitis C Screening: does qualify; postponed  Vision Screening: Recommended annual ophthalmology exams for early detection of glaucoma and other disorders of the eye. Is the patient up to date with their annual eye exam?  No  Who is the provider or what is the name of the office in which the pt attends annual eye exams? No provider If pt is not established with a provider, would they like to be referred to a provider to establish care? No .   Dental Screening: Recommended annual dental exams for proper oral hygiene  Community Resource Referral:  CRR required this visit?  No       Plan:    I have personally reviewed and addressed the Medicare Annual Wellness questionnaire and have noted the following in the patients chart:  A. Medical and social history B. Use of alcohol, tobacco or illicit drugs  C. Current medications and supplements D. Functional ability and status E.  Nutritional status F.  Physical activity G. Advance directives H. List of other physicians I.  Hospitalizations, surgeries, and ER visits in previous 12 months J.  [place] such as hearing and vision if needed, cognitive and depression L. Referrals and appointments   In addition, I have reviewed and discussed with patient certain preventive protocols, quality metrics, and best practice recommendations. A written personalized care plan for preventive services as well as general preventive health recommendations were provided to patient.   Signed,  Clemetine Marker, LPN Nurse Health Advisor   Nurse Notes: pt accompanied to visit today by his sister [name] and mother Mickel Baas. Pt c/o dry, red, itchy, flaky skin  on face and neck. Pt's sister states it has been like this for nearly a year or more. They do not shave his face regularly or use any harsh soaps or lotions. Requested Suezanne Cheshire AGNP and Dr. Ancil Boozer to look at the patient today since it is hard for him to get in to the office. His sister and mother are requesting a referral to dermatology for this ongoing issue.  Patient's left hand in contracted position causing callous build up from finger nails. His sister states they do exercises with his hand to help straighten it and requests a prescription for a brace to help keep it from becoming more contracted. He is expecting a new wheelchair soon from new [employer] but they do not do and DME for braces.   Patient has been taking OTC nexium for acid reflux due to failing prilosec in the past and family requests prescription for nexium due to cost of OTC meds. His sister doesn't think the OTC dosage is strong enough and he may need to increase to prescription strength. Please advise. Thank you!  _______ I have seen and evaluated this patient for red rash to face and on neck. Rash is red, dry and flaky, unchanged for one year. Treated with moisturizer. Patient endorses mild irritation. No conjunctiva involvement, not hot to touch. Discussed findings and plan with patient, refer to derm if unimproved.   Please use hydrocortisone cream once daily on face and neck. Use thin layer and avoid eyes. Use this sparingly as needed for severe itching and redness. Keep well moisturized. If it does not resolve or worsens please let us know and we can refer you to dermatology. Using steroid creams for too long can thin skin so use it the least amount as  needed.  Suezanne Cheshire AGNP-C

## 2018-08-05 NOTE — Telephone Encounter (Signed)
Let's get him an appointment for all of those issues please

## 2018-08-05 NOTE — Telephone Encounter (Signed)
Per Dr Sanda Klein request I did not contact pt to schedule appt

## 2018-08-08 ENCOUNTER — Other Ambulatory Visit: Payer: Self-pay

## 2018-08-08 MED ORDER — ESOMEPRAZOLE MAGNESIUM 40 MG PO CPDR: 40.0000 mg | DELAYED_RELEASE_CAPSULE | Freq: Every day | ORAL | 11 refills | Status: DC

## 2018-08-10 ENCOUNTER — Other Ambulatory Visit: Payer: Self-pay

## 2018-08-10 NOTE — Telephone Encounter (Signed)
Please see note.

## 2018-08-11 ENCOUNTER — Telehealth: Payer: Self-pay

## 2018-08-11 ENCOUNTER — Telehealth: Payer: Self-pay | Admitting: Family Medicine

## 2018-08-11 NOTE — Telephone Encounter (Signed)
PCP is out of office, I can provide this verbal order in her absence as it appears appropriate. Called number without answer.

## 2018-08-11 NOTE — Telephone Encounter (Signed)
That's fine, thank you (Please just clarify frequency for the chart, they left it starred)

## 2018-08-11 NOTE — Telephone Encounter (Signed)
Copied from [place] [id number]. Topic: Quick Communication - Home Health Verbal Orders >> Aug 11, 2018 12:22 PM Carolyn Stare wrote: Ledell Peoples with [facility]   Callback Number [phone]   Requesting  verbal to continue OT  brand new custom wheel chair and they are doing transition

## 2018-08-11 NOTE — Telephone Encounter (Signed)
Copied from [place] [id number]. Topic: Quick Communication - Home Health Verbal Orders >> Aug 11, 2018 12:22 PM Carolyn Stare wrote: Gerald Montgomery with [facility]   Callback Number [phone]   Requesting  verbal to continue OT  brand new custom wheel chair and they are doing transition   Frequency: ***

## 2018-08-11 NOTE — Telephone Encounter (Signed)
1 time a week for 4 weeks. Verbal orders given.

## 2018-08-11 NOTE — Telephone Encounter (Signed)
Verbal given to lisa

## 2018-08-12 NOTE — Telephone Encounter (Signed)
Hello Dr. Sanda Klein myself neither Ginger are unable to see an open encounter for this pt, so we are not able to close it. I'm unsure of what to do I apologize.

## 2018-08-25 ENCOUNTER — Telehealth: Payer: Self-pay | Admitting: Urology

## 2018-08-25 NOTE — Telephone Encounter (Signed)
Received a report from [facility]:  On 08/05/2018, two attempts were made to provide home health services to this patient.  On the first attempt, the caregiver refused service.  On the second attempt, there was no answer at the door.

## 2018-09-02 ENCOUNTER — Other Ambulatory Visit: Payer: Self-pay | Admitting: Family Medicine

## 2018-09-02 DIAGNOSIS — Z86718 Personal history of other venous thrombosis and embolism: Secondary | ICD-10-CM

## 2018-09-06 ENCOUNTER — Telehealth: Payer: Self-pay

## 2018-09-06 NOTE — Telephone Encounter (Signed)
Incoming fax from [facility] confirming suprapubic cath changes every 4 weeks. Also states that caregiver has been taught how to flush cath. Order signed and faxed back.

## 2018-09-26 ENCOUNTER — Other Ambulatory Visit: Payer: Self-pay | Admitting: Family Medicine

## 2018-09-26 DIAGNOSIS — F39 Unspecified mood [affective] disorder: Secondary | ICD-10-CM

## 2018-10-20 ENCOUNTER — Ambulatory Visit: Payer: Medicare Other | Admitting: Nurse Practitioner

## 2019-01-04 NOTE — Progress Notes (Deleted)
01/05/2019 10:23 AM   Gerald Montgomery 05/28/1955 [id number]  Referring provider: Arnetha Courser, MD [address] [place],  [place] [address]  No chief complaint on file.   HPI: Gerald Montgomery is a 63 year old male with cerebral palsy and a neurogenic bladder which is managed with a SPT.      PMH: Past Medical History:  Diagnosis Date  . Cerebral palsy ([place])   . Depression   . GERD (gastroesophageal reflux disease)   . History of DVT of lower extremity   . Mood disorder ([place])   . Neurogenic bladder   . Urinary incontinence     Surgical History: Past Surgical History:  Procedure Laterality Date  . COLONOSCOPY WITH PROPOFOL N/A 12/27/2017   Procedure: COLONOSCOPY WITH PROPOFOL;  Surgeon: Lin Landsman, MD;  Location: [facility] ENDOSCOPY;  Service: Gastroenterology;  Laterality: N/A;  . ESOPHAGOGASTRODUODENOSCOPY Left 02/07/2017   Procedure: ESOPHAGOGASTRODUODENOSCOPY (EGD);  Surgeon: Thornton Park, MD;  Location: [place];  Service: Gastroenterology;  Laterality: Left;  . ESOPHAGOGASTRODUODENOSCOPY (EGD) WITH PROPOFOL  12/27/2017   Procedure: ESOPHAGOGASTRODUODENOSCOPY (EGD) WITH PROPOFOL;  Surgeon: Lin Landsman, MD;  Location: [facility] ENDOSCOPY;  Service: Gastroenterology;;  . filter surgery for blood clot  2013  . HIP FRACTURE SURGERY  2013  . IR CATHETER TUBE CHANGE  07/07/2017    Home Medications:  Allergies as of 01/05/2019      Reactions   Macrobid [nitrofurantoin]    Penicillins    Sickness/sweating   Sulfa Antibiotics Itching      Medication List       Accurate as of January 04, 2019 10:23 AM. If you have any questions, ask your nurse or doctor.        acetaminophen 325 MG tablet Commonly known as: TYLENOL Take 1 tablet (325 mg total) by mouth every 6 (six) hours as needed for mild pain (or Fever >/= 101).   ARIPiprazole 15 MG tablet Commonly known as: ABILIFY TAKE 1 TABLET BY MOUTH ONCE DAILY   bisacodyl 5 MG EC tablet  Commonly known as: DULCOLAX Take 1 tablet (5 mg total) by mouth daily as needed for moderate constipation.   esomeprazole 20 MG capsule Commonly known as: NEXIUM Take 20 mg by mouth daily at 12 noon. OTC   esomeprazole 40 MG capsule Commonly known as: NEXIUM Take 1 capsule (40 mg total) by mouth daily before breakfast.   fesoterodine 8 MG Tb24 tablet Commonly known as: TOVIAZ Take 1 tablet (8 mg total) by mouth daily.   Gerhardt's butt cream Crea Apply 1 application topically 4 (four) times daily.   hydrocortisone 1 % ointment Apply 1 application topically daily. Avoid eyes. Use sparingly as needed.   nystatin powder Commonly known as: MYCOSTATIN/NYSTOP Apply topically 3 (three) times daily. To affected rash   Xarelto 10 MG Tabs tablet Generic drug: rivaroxaban TAKE 1 TABLET BY MOUTH ONCE DAILY       Allergies:  Allergies  Allergen Reactions  . Macrobid [Nitrofurantoin]   . Penicillins     Sickness/sweating  . Sulfa Antibiotics Itching    Family History: Family History  Problem Relation Age of Onset  . Heart disease Mother   . Atrial fibrillation Mother   . Diabetes Mother        Borderline  . Heart disease Father        Passed away of heart attack  . Kidney disease Neg Hx   . Prostate cancer Neg Hx  Social History:  reports that he has never smoked. He has never used smokeless tobacco. He reports that he does not drink alcohol or use drugs.  ROS:                                        Physical Exam: There were no vitals taken for this visit.  Constitutional:  Well nourished. Alert and oriented, No acute distress. HEENT: [place] AT, moist mucus membranes.  Trachea midline, no masses. Cardiovascular: No clubbing, cyanosis, or edema. Respiratory: Normal respiratory effort, no increased work of breathing. GI: Abdomen is soft, non tender, non distended, no abdominal masses. Liver and spleen not palpable.  No hernias appreciated.   Stool sample for occult testing is not indicated.   GU: No CVA tenderness.  No bladder fullness or masses.  Patient with circumcised/uncircumcised phallus. ***Foreskin easily retracted***  Urethral meatus is patent.  No penile discharge. No penile lesions or rashes. Scrotum without lesions, cysts, rashes and/or edema.  Testicles are located scrotally bilaterally. No masses are appreciated in the testicles. Left and right epididymis are normal. Rectal: Patient with  normal sphincter tone. Anus and perineum without scarring or rashes. No rectal masses are appreciated. Prostate is approximately *** grams, *** nodules are appreciated. Seminal vesicles are normal. Skin: No rashes, bruises or suspicious lesions. Lymph: No cervical or inguinal adenopathy. Neurologic: Grossly intact, no focal deficits, moving all 4 extremities. Psychiatric: Normal mood and affect.  Laboratory Data: Lab Results  Component Value Date   WBC 5.7 02/25/2018   HGB 15.0 02/25/2018   HCT 43.9 02/25/2018   MCV 91.3 02/25/2018   PLT 162 02/25/2018    Lab Results  Component Value Date   CREATININE 0.55 (L) 08/24/2017    No results found for: PSA  No results found for: TESTOSTERONE  Lab Results  Component Value Date   HGBA1C 5.2 03/04/2016    Lab Results  Component Value Date   TSH 2.098 08/24/2017       Component Value Date/Time   CHOL 200 03/04/2016 1112   HDL 48 03/04/2016 1112   CHOLHDL 4.2 03/04/2016 1112   VLDL 22 03/04/2016 1112   LDLCALC 130 (H) 03/04/2016 1112    Lab Results  Component Value Date   AST 22 08/24/2017   Lab Results  Component Value Date   ALT 25 08/24/2017   No components found for: ALKALINEPHOPHATASE No components found for: BILIRUBINTOTAL  No results found for: ESTRADIOL  Urinalysis    Component Value Date/Time   COLORURINE STRAW (A) 06/07/2017 2000   APPEARANCEUR CLEAR (A) 06/07/2017 2000   APPEARANCEUR Cloudy (A) 04/14/2017 1505   LABSPEC 1.004 (L) 06/07/2017  2000   LABSPEC 1.017 01/11/2012 0208   PHURINE 6.0 06/07/2017 2000   GLUCOSEU NEGATIVE 06/07/2017 2000   GLUCOSEU Negative 01/11/2012 0208   HGBUR SMALL (A) 06/07/2017 2000   BILIRUBINUR NEGATIVE 06/07/2017 2000   BILIRUBINUR Negative 04/14/2017 1505   BILIRUBINUR Negative 01/11/2012 0208   KETONESUR NEGATIVE 06/07/2017 2000   PROTEINUR NEGATIVE 06/07/2017 2000   UROBILINOGEN negative 10/08/2015 1009   NITRITE POSITIVE (A) 06/07/2017 2000   LEUKOCYTESUR MODERATE (A) 06/07/2017 2000   LEUKOCYTESUR 1+ (A) 04/14/2017 1505   LEUKOCYTESUR 3+ 01/11/2012 0208    I have reviewed the labs.   Pertinent Imaging: *** I have independently reviewed the films.    Assessment & Plan:  ***  1. Neurogenic bladder ***  No follow-ups on file.  These notes generated with voice recognition software. I apologize for typographical errors.  Zara Council, PA-C  [facility] [address]  [place], [place] [address] [phone]

## 2019-01-05 ENCOUNTER — Encounter: Payer: Self-pay | Admitting: Urology

## 2019-01-05 ENCOUNTER — Ambulatory Visit: Payer: Medicare Other | Admitting: Urology

## 2019-01-26 ENCOUNTER — Other Ambulatory Visit: Payer: Self-pay

## 2019-01-26 ENCOUNTER — Ambulatory Visit (INDEPENDENT_AMBULATORY_CARE_PROVIDER_SITE_OTHER): Payer: Medicare Other

## 2019-01-26 DIAGNOSIS — Z23 Encounter for immunization: Secondary | ICD-10-CM

## 2019-02-02 ENCOUNTER — Ambulatory Visit: Payer: Medicare Other | Admitting: Urology

## 2019-02-10 ENCOUNTER — Telehealth: Payer: Self-pay

## 2019-02-10 NOTE — Telephone Encounter (Signed)
Incoming faxed orders from [facility]. Orders signed by [facility] and faxed back.

## 2019-03-03 ENCOUNTER — Other Ambulatory Visit: Payer: Self-pay | Admitting: Family Medicine

## 2019-03-03 DIAGNOSIS — Z86718 Personal history of other venous thrombosis and embolism: Secondary | ICD-10-CM

## 2019-03-03 NOTE — Telephone Encounter (Signed)
Please schedule patient for follow up in the next 30 days.  

## 2019-03-03 NOTE — Telephone Encounter (Signed)
lvm for pt to schedule appt in 30 days. Also a 30 day supply of medication has been sent to pharmacy

## 2019-03-21 ENCOUNTER — Encounter: Payer: Self-pay | Admitting: Family Medicine

## 2019-03-21 ENCOUNTER — Other Ambulatory Visit: Payer: Self-pay

## 2019-03-21 ENCOUNTER — Ambulatory Visit (INDEPENDENT_AMBULATORY_CARE_PROVIDER_SITE_OTHER): Payer: Medicare Other | Admitting: Family Medicine

## 2019-03-21 VITALS — BP 122/82 | HR 96 | Temp 98.3°F | Resp 14

## 2019-03-21 DIAGNOSIS — G809 Cerebral palsy, unspecified: Secondary | ICD-10-CM | POA: Diagnosis not present

## 2019-03-21 DIAGNOSIS — Z86718 Personal history of other venous thrombosis and embolism: Secondary | ICD-10-CM

## 2019-03-21 DIAGNOSIS — B3789 Other sites of candidiasis: Secondary | ICD-10-CM

## 2019-03-21 DIAGNOSIS — D509 Iron deficiency anemia, unspecified: Secondary | ICD-10-CM | POA: Diagnosis not present

## 2019-03-21 DIAGNOSIS — Z7901 Long term (current) use of anticoagulants: Secondary | ICD-10-CM

## 2019-03-21 DIAGNOSIS — E7800 Pure hypercholesterolemia, unspecified: Secondary | ICD-10-CM | POA: Diagnosis not present

## 2019-03-21 DIAGNOSIS — K219 Gastro-esophageal reflux disease without esophagitis: Secondary | ICD-10-CM | POA: Diagnosis not present

## 2019-03-21 DIAGNOSIS — F39 Unspecified mood [affective] disorder: Secondary | ICD-10-CM

## 2019-03-21 DIAGNOSIS — Z5181 Encounter for therapeutic drug level monitoring: Secondary | ICD-10-CM

## 2019-03-21 LAB — COMPLETE METABOLIC PANEL WITH GFR
AG Ratio: 1.6 (calc) (ref 1.0–2.5)
ALT: 38 U/L (ref 9–46)
AST: 19 U/L (ref 10–35)
Albumin: 4.2 g/dL (ref 3.6–5.1)
Alkaline phosphatase (APISO): 82 U/L (ref 35–144)
BUN: 21 mg/dL (ref 7–25)
CO2: 25 mmol/L (ref 20–32)
Calcium: 9 mg/dL (ref 8.6–10.3)
Chloride: 107 mmol/L (ref 98–110)
Creat: 0.76 mg/dL (ref 0.70–1.25)
GFR, Est African American: 113 mL/min/{1.73_m2} (ref >=60)
GFR, Est Non African American: 98 mL/min/{1.73_m2} (ref >=60)
Globulin: 2.6 g/dL (calc) (ref 1.9–3.7)
Glucose, Bld: 102 mg/dL — ABNORMAL HIGH (ref 65–99)
Potassium: 4.5 mmol/L (ref 3.5–5.3)
Sodium: 141 mmol/L (ref 135–146)
Total Bilirubin: 0.3 mg/dL (ref 0.2–1.2)
Total Protein: 6.8 g/dL (ref 6.1–8.1)

## 2019-03-21 LAB — CBC WITH DIFFERENTIAL/PLATELET
Absolute Monocytes: 493 cells/uL (ref 200–950)
Basophils Absolute: 53 cells/uL (ref 0–200)
Basophils Relative: 0.6 %
Eosinophils Absolute: 88 cells/uL (ref 15–500)
Eosinophils Relative: 1 %
HCT: 41.9 % (ref 38.5–50.0)
Hemoglobin: 14.2 g/dL (ref 13.2–17.1)
Lymphs Abs: 1470 cells/uL (ref 850–3900)
MCH: 30 pg (ref 27.0–33.0)
MCHC: 33.9 g/dL (ref 32.0–36.0)
MCV: 88.4 fL (ref 80.0–100.0)
MPV: 11.2 fL (ref 7.5–12.5)
Monocytes Relative: 5.6 %
Neutro Abs: 6697 cells/uL (ref 1500–7800)
Neutrophils Relative %: 76.1 %
Platelets: 172 10*3/uL (ref 140–400)
RBC: 4.74 10*6/uL (ref 4.20–5.80)
RDW: 13.1 % (ref 11.0–15.0)
Total Lymphocyte: 16.7 %
WBC: 8.8 10*3/uL (ref 3.8–10.8)

## 2019-03-21 LAB — LIPID PANEL
Cholesterol: 191 mg/dL (ref <200)
HDL: 39 mg/dL — ABNORMAL LOW (ref >=40)
LDL Cholesterol (Calc): 124 mg/dL (calc) — ABNORMAL HIGH
Non-HDL Cholesterol (Calc): 152 mg/dL (calc) — ABNORMAL HIGH (ref <130)
Total CHOL/HDL Ratio: 4.9 (calc) (ref <5.0)
Triglycerides: 162 mg/dL — ABNORMAL HIGH (ref <150)

## 2019-03-21 MED ORDER — ARIPIPRAZOLE 15 MG PO TABS: ORAL_TABLET | ORAL | 1 refills | Status: DC

## 2019-03-21 MED ORDER — ESOMEPRAZOLE MAGNESIUM 40 MG PO CPDR: 40.0000 mg | DELAYED_RELEASE_CAPSULE | Freq: Every day | ORAL | 11 refills | Status: DC

## 2019-03-21 MED ORDER — RIVAROXABAN 10 MG PO TABS: 10.0000 mg | ORAL_TABLET | Freq: Every day | ORAL | 5 refills | Status: DC

## 2019-03-21 MED ORDER — NYSTATIN 100000 UNIT/GM EX POWD: Freq: Three times a day (TID) | CUTANEOUS | 5 refills | Status: DC

## 2019-03-21 NOTE — Progress Notes (Signed)
Name: Gerald Montgomery   MRN: [id number]    DOB: 01-03-1956   Date:03/21/2019       Progress Note  Chief Complaint  Patient presents with  . Follow-up  . Medication Refill     Subjective:   Gerald Montgomery is a 63 y.o. male, presents to clinic for routine follow up on the conditions listed above.  GERD - well controlled with nexium, still triggered by certain foods.  Request refill.  Skin breakdown- no current sores or ulcers, good skin care routine right now, with nystatin powder and bourdauexs and other barrier creams.  More difficult to control with any urinary issues or stool incontinence.  Has rash to face - been there for a long time that they are going to dermatology for it  Suprapubic catheter - 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  No bleeding or bruising concern.  No change to LE swelling, no SOB or CP.  Mood - request refill of Abilify - Dr. Sanda Klein would manage the medications.  Pts family member state his mood and behavior has been good.    Patient Active Problem List   Diagnosis Date Noted  . Iron deficiency anemia 08/24/2017  . Hypochromic microcytic anemia 08/03/2017  . GERD (gastroesophageal reflux disease) 08/03/2017  . Stage 1 decubitus ulcer 08/03/2017  . Abnormal urine odor 07/16/2016  . Need for home health care 06/17/2016  . Hyperglycemia 03/04/2016  . Screening for lipid disorders 03/04/2016  . Cerumen impaction 02/12/2016  . Cerebral palsy ([place]) 12/05/2015  . Hematuria, gross 10/07/2015  . History of DVT of lower extremity 04/11/2015  . Mood disorder ([place]) 04/11/2015  . Neurogenic bladder 04/11/2015    Past Surgical History:  Procedure Laterality Date  . COLONOSCOPY WITH PROPOFOL N/A 12/27/2017   Procedure: COLONOSCOPY WITH PROPOFOL;  Surgeon: Lin Landsman, MD;  Location: [facility] ENDOSCOPY;  Service: Gastroenterology;  Laterality: N/A;  . ESOPHAGOGASTRODUODENOSCOPY  Left 02/07/2017   Procedure: ESOPHAGOGASTRODUODENOSCOPY (EGD);  Surgeon: Thornton Park, MD;  Location: [place];  Service: Gastroenterology;  Laterality: Left;  . ESOPHAGOGASTRODUODENOSCOPY (EGD) WITH PROPOFOL  12/27/2017   Procedure: ESOPHAGOGASTRODUODENOSCOPY (EGD) WITH PROPOFOL;  Surgeon: Lin Landsman, MD;  Location: [facility] ENDOSCOPY;  Service: Gastroenterology;;  . filter surgery for blood clot  2013  . HIP FRACTURE SURGERY  2013  . IR CATHETER TUBE CHANGE  07/07/2017    Family History  Problem Relation Age of Onset  . Heart disease Mother   . Atrial fibrillation Mother   . Diabetes Mother        Borderline  . Heart disease Father        Passed away of heart attack  . Kidney disease Neg Hx   . Prostate cancer Neg Hx     Social History   Socioeconomic History  . Marital status: Single    Spouse name: Not on file  . Number of children: Not on file  . Years of education: Not on file  . Highest education level: Not on file  Occupational History  . Occupation: disabled  Social Needs  . Financial resource strain: Not hard at all  . Food insecurity    Worry: Never true    Inability: Never true  . Transportation needs    Medical: No    Non-medical: No  Tobacco Use  . Smoking status: Never Smoker  . Smokeless tobacco: Never Used  Substance and  Sexual Activity  . Alcohol use: No    Alcohol/week: 0.0 standard drinks  . Drug use: No  . Sexual activity: Never  Lifestyle  . Physical activity    Days per week: 0 days    Minutes per session: 0 min  . Stress: Not at all  Relationships  . Social connections    Talks on phone: More than three times a week    Gets together: More than three times a week    Attends religious service: Never    Active member of club or organization: No    Attends meetings of clubs or organizations: Never    Relationship status: Never married  . Intimate partner violence    Fear of current or ex partner: No    Emotionally abused:  No    Physically abused: No    Forced sexual activity: No  Other Topics Concern  . Not on file  Social History Narrative  . Not on file     Current Outpatient Medications:  .  acetaminophen (TYLENOL) 325 MG tablet, Take 1 tablet (325 mg total) by mouth every 6 (six) hours as needed for mild pain (or Fever >/= 101)., Disp: , Rfl:  .  ARIPiprazole (ABILIFY) 15 MG tablet, TAKE 1 TABLET BY MOUTH ONCE DAILY, Disp: 90 tablet, Rfl: 1 .  bisacodyl (DULCOLAX) 5 MG EC tablet, Take 1 tablet (5 mg total) by mouth daily as needed for moderate constipation., Disp: 30 tablet, Rfl: 0 .  esomeprazole (NEXIUM) 40 MG capsule, Take 1 capsule (40 mg total) by mouth daily before breakfast., Disp: 30 capsule, Rfl: 11 .  fesoterodine (TOVIAZ) 8 MG TB24 tablet, Take 1 tablet (8 mg total) by mouth daily., Disp: 30 tablet, Rfl: 6 .  Hydrocortisone (GERHARDT'S BUTT CREAM) CREA, Apply 1 application topically 4 (four) times daily., Disp: 1 each, Rfl: 5 .  nystatin (MYCOSTATIN/NYSTOP) powder, Apply topically 3 (three) times daily. To affected rash, Disp: 30 g, Rfl: 5 .  XARELTO 10 MG TABS tablet, TAKE 1 TABLET BY MOUTH ONCE DAILY, Disp: 30 tablet, Rfl: 0 .  esomeprazole (NEXIUM) 20 MG capsule, Take 20 mg by mouth daily at 12 noon. OTC, Disp: , Rfl:  .  hydrocortisone 1 % ointment, Apply 1 application topically daily. Avoid eyes. Use sparingly as needed. (Patient not taking: Reported on 03/21/2019), Disp: 30 g, Rfl: 0  Current Facility-Administered Medications:  .  lidocaine (XYLOCAINE) 2 % jelly 1 application, 1 application, Urethral, Once, Stoioff, Scott C, MD  Allergies  Allergen Reactions  . Macrobid [Nitrofurantoin]   . Penicillins     Sickness/sweating  . Sulfa Antibiotics Itching    I personally reviewed active problem list, medication list, allergies, family history, social history, health maintenance, notes from last encounter, lab results with the patient/caregiver today.  Review of Systems   Constitutional: Negative.   HENT: Negative.   Eyes: Negative.   Respiratory: Negative.   Cardiovascular: Negative.   Gastrointestinal: Negative.   Endocrine: Negative.   Genitourinary: Negative.   Musculoskeletal: Negative.   Skin: Negative.   Allergic/Immunologic: Negative.   Neurological: Negative.   Hematological: Negative.   Psychiatric/Behavioral: Negative.   All other systems reviewed and are negative.    Objective:    Vitals:   03/21/19 1107  BP: 122/82  Pulse: 96  Resp: 14  Temp: 98.3 F (36.8 C)  SpO2: 98%    There is no height or weight on file to calculate BMI.  Physical Exam Vitals signs and nursing note  reviewed.  Constitutional:      Appearance: He is well-developed.  HENT:     Head: Normocephalic and atraumatic.     Nose: Nose normal.  Eyes:     General:        Right eye: No discharge.        Left eye: No discharge.     Conjunctiva/sclera: Conjunctivae normal.  Neck:     Trachea: No tracheal deviation.  Cardiovascular:     Rate and Rhythm: Normal rate and regular rhythm.  Pulmonary:     Effort: Pulmonary effort is normal. No respiratory distress.     Breath sounds: No stridor or decreased air movement. Examination of the right-lower field reveals decreased breath sounds. Examination of the left-lower field reveals decreased breath sounds. Decreased breath sounds present.  Musculoskeletal: Normal range of motion.  Skin:    General: Skin is warm and dry.     Findings: No rash.  Neurological:     Mental Status: He is alert.     Motor: Atrophy and abnormal muscle tone present.     Coordination: Coordination abnormal.     Comments: Pt in wheelchair B/l LE atrophy and left UE atrophy and some contracture   Psychiatric:        Behavior: Behavior normal.      No results found for this or any previous visit (from the past 2160 hour(s)).  Diabetic Foot Exam: Diabetic Foot Exam - Simple   No data filed       PHQ2/9: Depression screen  [facility] 2/9 03/21/2019 08/05/2018 02/18/2018 08/03/2017 05/31/2017  Decreased Interest 0 0 0 0 0  Down, Depressed, Hopeless 0 0 0 0 0  PHQ - 2 Score 0 0 0 0 0  Altered sleeping 0 - 0 - -  Tired, decreased energy 0 - 0 - -  Change in appetite 0 - 0 - -  Feeling bad or failure about yourself  0 - 0 - -  Trouble concentrating 0 - 0 - -  Moving slowly or fidgety/restless 0 - 0 - -  Suicidal thoughts 0 - 0 - -  PHQ-9 Score 0 - 0 - -  Difficult doing work/chores Not difficult at all - Not difficult at all - -    phq 9 is negative reviewed  Fall Risk: Fall Risk  03/21/2019 08/05/2018 02/18/2018 08/03/2017 05/31/2017  Falls in the past year? 0 0 No No No  Number falls in past yr: 0 0 - - -  Injury with Fall? 0 0 - - -  Risk for fall due to : - - - - -  Follow up - Falls evaluation completed - - -      Functional Status Survey: Is the patient deaf or have difficulty hearing?: No Does the patient have difficulty seeing, even when wearing glasses/contacts?: No Does the patient have difficulty concentrating, remembering, or making decisions?: Yes Does the patient have difficulty walking or climbing stairs?: Yes Does the patient have difficulty dressing or bathing?: Yes Does the patient have difficulty doing errands alone such as visiting a doctor's office or shopping?: Yes    Assessment & Plan:       ICD-10-CM   1. Cerebral palsy, unspecified type ([place])  G80.9    at his baseline, no concerns  2. Iron deficiency anemia, unspecified iron deficiency anemia type  D50.9 CBC with Differential/Platelet   hx of anemia, no melena or hematochezia  3. Pure hypercholesterolemia  [id number] COMPLETE METABOLIC PANEL WITH GFR  Lipid panel   hx of HLD, no meds  4. Gastroesophageal reflux disease without esophagitis  K21.9    well controlled with current meds, still some food triggers  5. History of DVT of lower extremity  Z86.718 rivaroxaban (XARELTO) 10 MG TABS tablet   No worsening swelling, no SOB, CP or  tachychardia  6. Candida rash of groin  B37.89    well controlled with current regimine   7. Mood disorder ([facility])  F39 ARIPiprazole (ABILIFY) 15 MG tablet    CBC with Differential/Platelet    COMPLETE METABOLIC PANEL WITH GFR    Lipid panel   moods good with abilify  8. Monitoring for long-term anticoagulant use  Z51.81 CBC with Differential/Platelet   123456 COMPLETE METABOLIC PANEL WITH GFR  9. Medication monitoring encounter  Z51.81 CBC with Differential/Platelet    COMPLETE METABOLIC PANEL WITH GFR    Lipid panel     Return in about 6 months (around 09/19/2019) for Routine follow-up, Medicare Well Visit (due next March).   Delsa Grana, PA-C 03/21/19 11:41 AM

## 2019-03-23 ENCOUNTER — Other Ambulatory Visit: Payer: Self-pay | Admitting: Family Medicine

## 2019-03-23 ENCOUNTER — Encounter: Payer: Self-pay | Admitting: Family Medicine

## 2019-03-23 DIAGNOSIS — E785 Hyperlipidemia, unspecified: Secondary | ICD-10-CM | POA: Insufficient documentation

## 2019-03-23 NOTE — Progress Notes (Unsigned)
i

## 2019-04-01 ENCOUNTER — Other Ambulatory Visit: Payer: Self-pay | Admitting: Urology

## 2019-05-03 ENCOUNTER — Other Ambulatory Visit: Payer: Self-pay | Admitting: Urology

## 2019-06-06 ENCOUNTER — Telehealth: Payer: Self-pay | Admitting: Urology

## 2019-06-06 NOTE — Telephone Encounter (Signed)
Patient has not been seen since 06/2018 and is wanting advise on increased sediment and flushing options. Do you want to see patient before discussing increased flushing?

## 2019-06-06 NOTE — Telephone Encounter (Signed)
I do understand not wanting to come to the office due to COVID, but this cannot be managed over the phone.

## 2019-06-06 NOTE — Telephone Encounter (Signed)
Pt's sister (caregiver) called and states that he is having increased amount of sediment in his bag. She would like a call back to discuss options for flushing, she does not want to bring him to the office if possible, due to [place].

## 2019-06-06 NOTE — Telephone Encounter (Signed)
Spoke to patient's sister(caregiver) and informed her that patient will need to be seen. She only wants to see Stoioff. Appointment has been made.

## 2019-06-21 ENCOUNTER — Telehealth: Payer: Self-pay | Admitting: Urology

## 2019-06-21 NOTE — Telephone Encounter (Signed)
Error

## 2019-06-23 ENCOUNTER — Telehealth (INDEPENDENT_AMBULATORY_CARE_PROVIDER_SITE_OTHER): Payer: Medicare Other | Admitting: Urology

## 2019-06-23 ENCOUNTER — Other Ambulatory Visit: Payer: Self-pay

## 2019-06-23 DIAGNOSIS — N319 Neuromuscular dysfunction of bladder, unspecified: Secondary | ICD-10-CM

## 2019-06-23 MED ORDER — OXYBUTYNIN CHLORIDE 5 MG PO TABS: ORAL_TABLET | ORAL | 0 refills | Status: DC

## 2019-06-23 NOTE — Progress Notes (Signed)
Virtual Visit via Telephone Note  I connected with Gerald Montgomery on 06/23/19 at  1:00 PM EST by telephone and verified that I am speaking with the correct person using two identifiers.  Mr. Douget has cerebral palsy and I spoke with his primary caregiver, Gerald Montgomery who is his sister and on his DPR.  They requested a virtual visit secondary to COVID-19 pandemic.  Location: Patient: Home Provider: Office, [facility]   I discussed the limitations, risks, security and privacy concerns of performing an evaluation and management service by telephone and the availability of in person appointments. I also discussed with the patient that there may be a patient responsible charge related to this service. The patient expressed understanding and agreed to proceed.   History of Present Illness: 64 y.o. male with a neurogenic bladder secondary to cerebral palsy and managed with an indwelling suprapubic tube for several years.  His catheter is changed by home health.  He was last seen 06/2018 for cystoscopy.  His sister states the past several weeks she has noted significant amount of increased sediment in his catheter and drainage bag occasionally clogging the catheter necessitating home health calls for catheter change.  She has been irrigating the catheter with sterile saline daily which has helped.  He intermittently complains of penile pain with leakage around the catheter which seems to be associated with increased sediment.  He remains on extended release fesoterodine 8 mg daily.   Observations/Objective: N/A  Assessment and Plan: 64 y.o. male with cerebral palsy and neurogenic bladder managed with an indwelling SP tube.  Will start acetic acid irrigation and Rx will be sent to pharmacy.  Will add immediate release oxybutynin 5 mg every 12 hours as needed for bladder spasms.  Follow Up Instructions: 6 months   I discussed the assessment and treatment plan with the patient. The  patient was provided an opportunity to ask questions and all were answered. The patient agreed with the plan and demonstrated an understanding of the instructions.   The patient was advised to call back or seek an in-person evaluation if the symptoms worsen or if the condition fails to improve as anticipated.  I provided 12 minutes of non-face-to-face time during this encounter.   Abbie Sons, MD

## 2019-06-26 ENCOUNTER — Telehealth: Payer: Self-pay

## 2019-06-26 NOTE — Telephone Encounter (Signed)
-----   Message from Abbie Sons, MD sent at 06/25/2019 11:48 AM EST ----- Regarding: RE: Acetic acid irrigation The home mixed instruction is fine ----- Message ----- From: Royanne Foots, CMA Sent: 06/23/2019   1:29 PM EST To: Abbie Sons, MD Subject: RE: Acetic acid irrigation                     It can be sent into the pharmacy or I can give them the vinegar home mix instruction sheet which ever you prefer ----- Message ----- From: Abbie Sons, MD Sent: 06/23/2019   1:13 PM EST To: Royanne Foots, CMA Subject: Acetic acid irrigation                         Patient with cerebral palsy and neurogenic bladder.  Sister is primary caregiver.  He has been having increased sediment from his SP tube.  Can we start on acetic acid irrigations?  Does this get called into a particular pharmacy and can you let his sister know?  Thanks,!

## 2019-06-26 NOTE — Telephone Encounter (Signed)
Spoke with patient's sister and verbally gave her instruction on bladder instillation of Vinegar solution. She verbalized instruction back and understands protocol.    Vinegar Bladder Irrigation Protocol patient education  Patient's on intermittent catheterization with chronic bacteriuria and/or chronic bladder stones, irrigating the bladder with a dilute vinegar solution can be beneficial.  The recommended concentration is 0.25% acetic acid. Most grocery stores carry white vinegar as a 5% solution.  Therefore, to make appropriate bladder irrigations, it needs to be diluted at a ratio of roughly 20:1.  To achieve this, see the chart below to determine what amount of 5% white vinegar solution you should mix with your normal bladder irrigation (homemade saline or sterile sodium chloride from the pharmacy).  Amount of 5% White Vinegar Solution to Mix In: If Your Normal Bladder Irrigation Amount Is: 2.5 teaspoons (12.5 mL) 250 mL irrigation 5 teaspoons (25 mL) 500 mL irrigation 10 teaspoons (50 mL) 1000 mL irrigation About 6 ounces 1 gallon irrigation  leave some irrigation in the bladder for a period of time to dissolve debris/mucous. Discontinue irrigation if it causes pain or discomfort. Do not use this solution if you believe

## 2019-07-04 ENCOUNTER — Telehealth: Payer: Self-pay | Admitting: Family Medicine

## 2019-07-04 NOTE — Telephone Encounter (Signed)
We received a PA on Toviaz a RX that was prescribed 2 months ago. I spoke to patient's mother and he has not been taking this medication. She states he has not had any problems and they only have used the Oxybutynin a couple times. The have been irrigating the catheter with the vinager and water solution and that seems to be working very well. I discontinued Toviaz.

## 2019-08-08 ENCOUNTER — Other Ambulatory Visit: Payer: Self-pay | Admitting: Urology

## 2019-08-08 ENCOUNTER — Other Ambulatory Visit: Payer: Self-pay | Admitting: Family Medicine

## 2019-08-08 ENCOUNTER — Other Ambulatory Visit: Payer: Self-pay | Admitting: Dermatology

## 2019-08-08 DIAGNOSIS — F39 Unspecified mood [affective] disorder: Secondary | ICD-10-CM

## 2019-08-08 NOTE — Telephone Encounter (Signed)
Refill request for general medication.  Last office visit:03/21/19  No follow-ups on file.

## 2019-09-05 ENCOUNTER — Other Ambulatory Visit: Payer: Self-pay | Admitting: Family Medicine

## 2019-09-05 DIAGNOSIS — Z86718 Personal history of other venous thrombosis and embolism: Secondary | ICD-10-CM

## 2019-09-20 ENCOUNTER — Ambulatory Visit: Payer: Medicare Other | Admitting: Family Medicine

## 2019-09-20 ENCOUNTER — Other Ambulatory Visit: Payer: Self-pay | Admitting: Dermatology

## 2019-09-28 ENCOUNTER — Other Ambulatory Visit: Payer: Self-pay | Admitting: Urology

## 2019-10-05 ENCOUNTER — Ambulatory Visit (INDEPENDENT_AMBULATORY_CARE_PROVIDER_SITE_OTHER): Payer: Medicare Other | Admitting: Family Medicine

## 2019-10-05 ENCOUNTER — Other Ambulatory Visit: Payer: Self-pay

## 2019-10-05 ENCOUNTER — Telehealth: Payer: Self-pay | Admitting: Family Medicine

## 2019-10-05 ENCOUNTER — Encounter: Payer: Self-pay | Admitting: Family Medicine

## 2019-10-05 VITALS — BP 134/82 | HR 87 | Temp 98.5°F | Resp 16

## 2019-10-05 DIAGNOSIS — Z9181 History of falling: Secondary | ICD-10-CM

## 2019-10-05 DIAGNOSIS — J302 Other seasonal allergic rhinitis: Secondary | ICD-10-CM | POA: Diagnosis not present

## 2019-10-05 DIAGNOSIS — H109 Unspecified conjunctivitis: Secondary | ICD-10-CM | POA: Diagnosis not present

## 2019-10-05 DIAGNOSIS — F39 Unspecified mood [affective] disorder: Secondary | ICD-10-CM

## 2019-10-05 DIAGNOSIS — D509 Iron deficiency anemia, unspecified: Secondary | ICD-10-CM

## 2019-10-05 DIAGNOSIS — L89151 Pressure ulcer of sacral region, stage 1: Secondary | ICD-10-CM

## 2019-10-05 DIAGNOSIS — E7800 Pure hypercholesterolemia, unspecified: Secondary | ICD-10-CM | POA: Diagnosis not present

## 2019-10-05 DIAGNOSIS — M24542 Contracture, left hand: Secondary | ICD-10-CM

## 2019-10-05 DIAGNOSIS — Z5181 Encounter for therapeutic drug level monitoring: Secondary | ICD-10-CM

## 2019-10-05 DIAGNOSIS — Z86718 Personal history of other venous thrombosis and embolism: Secondary | ICD-10-CM

## 2019-10-05 DIAGNOSIS — R413 Other amnesia: Secondary | ICD-10-CM

## 2019-10-05 DIAGNOSIS — K219 Gastro-esophageal reflux disease without esophagitis: Secondary | ICD-10-CM

## 2019-10-05 DIAGNOSIS — Z7901 Long term (current) use of anticoagulants: Secondary | ICD-10-CM

## 2019-10-05 DIAGNOSIS — G809 Cerebral palsy, unspecified: Secondary | ICD-10-CM

## 2019-10-05 MED ORDER — CROMOLYN SODIUM 4 % OP SOLN: 1.0000 [drp] | Freq: Four times a day (QID) | OPHTHALMIC | 0 refills | Status: DC | PRN

## 2019-10-05 NOTE — Telephone Encounter (Signed)
Pharmacy called stating that the prescription that was sent in for pts eyes is on back order for 2 weeks. Please advise.    [place], [place] [address]  Phone: [phone] Fax: [phone]  Not a 24 hour pharmacy; exact hours not known.

## 2019-10-05 NOTE — Patient Instructions (Signed)
Cromolyn drops - cash price on [employer] is <$20 If still too expensive get pataday generic over the counter  For dry eyes get systane and blink gel eye drops and do daily

## 2019-10-05 NOTE — Progress Notes (Signed)
Name: Gerald Montgomery   MRN: [id number]    DOB: [birth year]   Date:10/05/2019       Progress Note  Chief Complaint  Patient presents with  . Follow-up  . Depression  . Gastroesophageal Reflux  . Referral    in home PT/OT for mechanical lift, wheelchair coverage     Subjective:   Gerald Montgomery is a 64 y.o. male, presents to clinic for routine follow up on the conditions listed above.  Very red inflammed eyes, injected claritin and flonase - helped but they ran out Not doing any eye drops right now - very red lately, he complains of burning  Did eval with wheel chair - a long time ago but need to come back out to adjust, family needs help with transferring need a lift, want home health eval for lifts/WC etc  GERD good, no sx or compliants since starting daily meds  MDD - abilify - they state mood is good, sometimes depressed, no behavioral issues Depression screen [facility] 2/9 10/05/2019 03/21/2019 08/05/2018  Decreased Interest 1 0 0  Down, Depressed, Hopeless 1 0 0  PHQ - 2 Score 2 0 0  Altered sleeping 0 0 -  Tired, decreased energy 0 0 -  Change in appetite 0 0 -  Feeling bad or failure about yourself  1 0 -  Trouble concentrating 3 0 -  Moving slowly or fidgety/restless 0 0 -  Suicidal thoughts 0 0 -  PHQ-9 Score 6 0 -  Difficult doing work/chores Somewhat difficult Not difficult at all -   Was on a different medicine int he past but he was "zombied out"  Moods and concentration Memory problems - good long term memory, worsening short term memory - can't remember things, trouble concentrating Gradually worsening - has not seen neurology, past PCP was prescribing abilify   Pressure spot still there on his buttock that they came in for in the past       Patient Active Problem List   Diagnosis Date Noted  . Hyperlipidemia, unspecified 03/23/2019  . Iron deficiency anemia 08/24/2017  . Hypochromic microcytic anemia 08/03/2017  . GERD (gastroesophageal reflux  disease) 08/03/2017  . Stage 1 decubitus ulcer 08/03/2017  . Abnormal urine odor 07/16/2016  . Need for home health care 06/17/2016  . Hyperglycemia 03/04/2016  . Screening for lipid disorders 03/04/2016  . Cerumen impaction 02/12/2016  . Cerebral palsy ([place]) 12/05/2015  . History of DVT of lower extremity 04/11/2015  . Mood disorder ([place]) 04/11/2015  . Neurogenic bladder 04/11/2015    Past Surgical History:  Procedure Laterality Date  . COLONOSCOPY WITH PROPOFOL N/A 12/27/2017   Procedure: COLONOSCOPY WITH PROPOFOL;  Surgeon: Lin Landsman, MD;  Location: [facility] ENDOSCOPY;  Service: Gastroenterology;  Laterality: N/A;  . ESOPHAGOGASTRODUODENOSCOPY Left 02/07/2017   Procedure: ESOPHAGOGASTRODUODENOSCOPY (EGD);  Surgeon: Thornton Park, MD;  Location: [facility];  Service: Gastroenterology;  Laterality: Left;  . ESOPHAGOGASTRODUODENOSCOPY (EGD) WITH PROPOFOL  12/27/2017   Procedure: ESOPHAGOGASTRODUODENOSCOPY (EGD) WITH PROPOFOL;  Surgeon: Lin Landsman, MD;  Location: [facility] ENDOSCOPY;  Service: Gastroenterology;;  . filter surgery for blood clot  2013  . HIP FRACTURE SURGERY  2013  . IR CATHETER TUBE CHANGE  07/07/2017    Family History  Problem Relation Age of Onset  . Heart disease Mother   . Atrial fibrillation Mother   . Diabetes Mother        Borderline  . Heart disease Father  Passed away of heart attack  . Kidney disease Neg Hx   . Prostate cancer Neg Hx     Social History   Tobacco Use  . Smoking status: Never Smoker  . Smokeless tobacco: Never Used  Substance Use Topics  . Alcohol use: No    Alcohol/week: 0.0 standard drinks  . Drug use: No      Current Outpatient Medications:  .  acetaminophen (TYLENOL) 325 MG tablet, Take 1 tablet (325 mg total) by mouth every 6 (six) hours as needed for mild pain (or Fever >/= 101)., Disp: , Rfl:  .  ARIPiprazole (ABILIFY) 15 MG tablet, TAKE 1 TABLET BY MOUTH ONCE DAILY, Disp: 90 tablet, Rfl: 3 .   esomeprazole (NEXIUM) 40 MG capsule, Take 1 capsule (40 mg total) by mouth daily before breakfast., Disp: 30 capsule, Rfl: 11 .  metroNIDAZOLE (METROGEL) 1 % gel, APPLY A SMALL AMOUNT TO AFFECTED AREA TWICE DAILY, Disp: 60 g, Rfl: 0 .  nystatin (MYCOSTATIN/NYSTOP) powder, Apply topically 3 (three) times daily. To affected rash, Disp: 30 g, Rfl: 5 .  oxybutynin (DITROPAN) 5 MG tablet, TAKE 1/2 TABLET BY MOUTH TWICE DAILY AS NEEDED BLADDER SPASM, Disp: 60 tablet, Rfl: 0 .  XARELTO 10 MG TABS tablet, TAKE 1 TABLET BY MOUTH ONCE DAILY, Disp: 30 tablet, Rfl: 5 .  bisacodyl (DULCOLAX) 5 MG EC tablet, Take 1 tablet (5 mg total) by mouth daily as needed for moderate constipation. (Patient not taking: Reported on 10/05/2019), Disp: 30 tablet, Rfl: 0  Current Facility-Administered Medications:  .  lidocaine (XYLOCAINE) 2 % jelly 1 application, 1 application, Urethral, Once, Stoioff, Scott C, MD  Allergies  Allergen Reactions  . Macrobid [Nitrofurantoin]   . Penicillins     Sickness/sweating  . Sulfa Antibiotics Itching    Chart Review Today: I personally reviewed active problem list, medication list, allergies, family history, social history, health maintenance, notes from last encounter, lab results, imaging with the patient/caregiver today.   Review of Systems  10 Systems reviewed and are negative for acute change except as noted in the HPI.  Objective:    Vitals:   10/05/19 1144  BP: 134/82  Pulse: 87  Resp: 16  Temp: 98.5 F (36.9 C)  SpO2: 97%    There is no height or weight on file to calculate BMI.  Physical Exam Vitals and nursing note reviewed.  Constitutional:      Appearance: He is well-developed. He is ill-appearing (chronically).  HENT:     Head: Normocephalic and atraumatic.     Nose: Nose normal.  Eyes:     General:        Right eye: No discharge.        Left eye: No discharge.     Conjunctiva/sclera: Conjunctivae normal.  Neck:     Trachea: No tracheal  deviation.  Cardiovascular:     Rate and Rhythm: Normal rate and regular rhythm.  Pulmonary:     Effort: Pulmonary effort is normal. No respiratory distress.     Breath sounds: No stridor or decreased air movement.  Musculoskeletal:        General: Normal range of motion.  Skin:    General: Skin is warm and dry.     Findings: No rash.  Neurological:     Mental Status: He is alert.     Motor: Atrophy and abnormal muscle tone present.     Coordination: Coordination abnormal.     Comments: Pt in wheelchair B/l LE atrophy and  left UE atrophy and some contracture   Psychiatric:        Behavior: Behavior normal.      Fall Risk: Fall Risk  10/05/2019 03/21/2019 08/05/2018 02/18/2018 08/03/2017  Falls in the past year? 0 0 0 No No  Number falls in past yr: 0 0 0 - -  Injury with Fall? 0 0 0 - -  Risk for fall due to : - - - - -  Follow up - - Falls evaluation completed - -    Functional Status Survey: Is the patient deaf or have difficulty hearing?: No Does the patient have difficulty seeing, even when wearing glasses/contacts?: No Does the patient have difficulty concentrating, remembering, or making decisions?: No Does the patient have difficulty walking or climbing stairs?: Yes Does the patient have difficulty dressing or bathing?: Yes Does the patient have difficulty doing errands alone such as visiting a doctor's office or shopping?: Yes   Assessment & Plan:     ICD-10-CM   1. Conjunctivitis of both eyes, unspecified conjunctivitis type  H10.9    restart flonase and claritin, use allergy eye drops - ddx allergic conjunctivitis vs dry eye?  Suggested systane and lubricating drops for dry eye if not improv  2. Seasonal allergies  J30.2    see above - restart OTC med  3. Mood disorder ([place])  F39    pt on abilify by past PCP, mood overall good, but sometimes down, no behavioral issues  4. Pure hypercholesterolemia  [id number] COMPLETE METABOLIC PANEL WITH GFR    Lipid panel  5.  Iron deficiency anemia, unspecified iron deficiency anemia type  D50.9 CBC with Differential/Platelet  6. Cerebral palsy, unspecified type ([place])  G80.9 Ambulatory referral to Home Health  7. Gastroesophageal reflux disease, unspecified whether esophagitis present  K21.9    well controlled on nexium  8. Pressure injury of sacral region, stage 1  L89.151 Ambulatory referral to Home Health  9. History of DVT of lower extremity  Z86.718   10. Monitoring for long-term anticoagulant use  [id number] COMPLETE METABOLIC PANEL WITH GFR   Z79.01 CBC with Differential/Platelet  11. Memory changes  R41.3    a recent decline in memory and concentration, f/up neurology  12. Encounter for medication monitoring  [id number] COMPLETE METABOLIC PANEL WITH GFR    Lipid panel    cromolyn (OPTICROM) 4 % ophthalmic solution    CBC with Differential/Platelet  13. At maximum risk for fall  Z91.81 Ambulatory referral to Home Health  14. Contracture of joint of hand, left  M24.542 Ambulatory referral to Home Health     No follow-ups on file.   Delsa Grana, PA-C 10/05/19 12:08 PM

## 2019-10-06 LAB — COMPLETE METABOLIC PANEL WITH GFR
AG Ratio: 1.6 (calc) (ref 1.0–2.5)
ALT: 19 U/L (ref 9–46)
AST: 14 U/L (ref 10–35)
Albumin: 4.4 g/dL (ref 3.6–5.1)
Alkaline phosphatase (APISO): 86 U/L (ref 35–144)
BUN/Creatinine Ratio: 34 (calc) — ABNORMAL HIGH (ref 6–22)
BUN: 21 mg/dL (ref 7–25)
CO2: 22 mmol/L (ref 20–32)
Calcium: 9 mg/dL (ref 8.6–10.3)
Chloride: 105 mmol/L (ref 98–110)
Creat: 0.62 mg/dL — ABNORMAL LOW (ref 0.70–1.25)
GFR, Est African American: 123 mL/min/{1.73_m2} (ref >=60)
GFR, Est Non African American: 106 mL/min/{1.73_m2} (ref >=60)
Globulin: 2.7 g/dL (calc) (ref 1.9–3.7)
Glucose, Bld: 93 mg/dL (ref 65–99)
Potassium: 4.1 mmol/L (ref 3.5–5.3)
Sodium: 138 mmol/L (ref 135–146)
Total Bilirubin: 0.5 mg/dL (ref 0.2–1.2)
Total Protein: 7.1 g/dL (ref 6.1–8.1)

## 2019-10-06 LAB — LIPID PANEL
Cholesterol: 191 mg/dL (ref <200)
HDL: 45 mg/dL (ref >=40)
LDL Cholesterol (Calc): 129 mg/dL (calc) — ABNORMAL HIGH
Non-HDL Cholesterol (Calc): 146 mg/dL (calc) — ABNORMAL HIGH (ref <130)
Total CHOL/HDL Ratio: 4.2 (calc) (ref <5.0)
Triglycerides: 80 mg/dL (ref <150)

## 2019-10-06 LAB — CBC WITH DIFFERENTIAL/PLATELET
Absolute Monocytes: 451 cells/uL (ref 200–950)
Basophils Absolute: 51 cells/uL (ref 0–200)
Basophils Relative: 0.6 %
Eosinophils Absolute: 43 cells/uL (ref 15–500)
Eosinophils Relative: 0.5 %
HCT: 43.5 % (ref 38.5–50.0)
Hemoglobin: 14.5 g/dL (ref 13.2–17.1)
Lymphs Abs: 1105 cells/uL (ref 850–3900)
MCH: 29.5 pg (ref 27.0–33.0)
MCHC: 33.3 g/dL (ref 32.0–36.0)
MCV: 88.6 fL (ref 80.0–100.0)
MPV: 11.4 fL (ref 7.5–12.5)
Monocytes Relative: 5.3 %
Neutro Abs: 6851 cells/uL (ref 1500–7800)
Neutrophils Relative %: 80.6 %
Platelets: 183 10*3/uL (ref 140–400)
RBC: 4.91 10*6/uL (ref 4.20–5.80)
RDW: 13 % (ref 11.0–15.0)
Total Lymphocyte: 13 %
WBC: 8.5 10*3/uL (ref 3.8–10.8)

## 2019-10-06 NOTE — Telephone Encounter (Signed)
Patient family notified

## 2019-10-17 ENCOUNTER — Telehealth: Payer: Self-pay

## 2019-10-17 NOTE — Telephone Encounter (Signed)
Copied from [place] [id number]. Topic: General - Other >> Oct 17, 2019  9:54 AM Rainey Pines A wrote: Patients sister [name] would like a callback from [facility] in regards to a status update on the home health referral that they talked about with Lucio Edward about 2 weeks ago. Please advise

## 2019-10-17 NOTE — Telephone Encounter (Signed)
Were you going to refer him to home health and PT?

## 2019-10-18 ENCOUNTER — Encounter: Payer: Self-pay | Admitting: Family Medicine

## 2019-10-18 NOTE — Telephone Encounter (Signed)
They wanted [place] for old WC from something they started over a year ago?  The WC they don't know how to use and wanted lift, etc? I put in [facility] orders - you can look them over and see if they look right?  Can put in again if needed and I'll cosign.

## 2019-10-20 ENCOUNTER — Other Ambulatory Visit: Payer: Self-pay | Admitting: Dermatology

## 2019-10-23 ENCOUNTER — Telehealth: Payer: Self-pay | Admitting: Family Medicine

## 2019-10-23 NOTE — Telephone Encounter (Signed)
Per staff message from referrals and [place] -   " Elenora Fender, Laverda Sorenson, CMA  Delsa Grana, PA-C      Previous Messages   ----- Message -----  From: Jana Half  Sent: 10/20/2019  3:31 PM EDT  To: Janene Madeira, CMA  Subject: [place]! JUST GOT IN TOUCH WITH BARBARA PNT'S SISTER AND SHE STATES THAT HE IS ACTIVE WITH [facility] ALREADY. CAN YOU SEND THEM THE UPDATED ORDER? SHE SAID THAT HE REALLY NEEDS PT FOR HOME EVALUATION. HE NEEDS AN ORDER FOR A HOYER LIFT AS WELL. "  Will try to contact [facility] to give verbal [place] orders - pt had previously worked with home health company and prior PCP on some in-home evaluation and had previously gotten specialized wheelchair but family stated to me at his last visit that they need to reconnect and need help with using the wheelchair and still need help getting a lift for the patient.  I attempted put in a home health order again and this was the messages that I got in response.  We will contact the prior company see if they can send out PT OT?  And do the evaluation for patient's needs will sign order once their evaluation and required orders is complete.  Delsa Grana, PA-C

## 2019-10-24 NOTE — Telephone Encounter (Signed)
Called added orders for PT and OT to help get lift

## 2019-10-26 ENCOUNTER — Ambulatory Visit: Payer: Medicare Other

## 2019-10-31 ENCOUNTER — Telehealth: Payer: Self-pay

## 2019-10-31 NOTE — Telephone Encounter (Signed)
[name], Physical Therapist, reports he consulted with Mr Gerald Montgomery sister and due to patients disease process, sister does not believe PT is necessary at this time. Patients sister would like to proceed with occupational therapy only instead of physical therapy and occupational therapy combined. [name] wanted to make Dr Bernardo Heater aware.

## 2019-10-31 NOTE — Telephone Encounter (Signed)
Error

## 2019-11-01 NOTE — Telephone Encounter (Signed)
Copied from [place] [id number]. Topic: General - Other >> Oct 31, 2019  4:03 PM Marya Landry D wrote: Reason for CRM:  Filomena Jungling called from [facility] looking to get some verbal orders  [phone] and needs to discuss orders for equipment including a hospital bed, special lift,(been in current bed for about 8 years), is suffering from extreme anxiety and would like to discuss possible prescriptions for that as well.please advise

## 2019-11-01 NOTE — Telephone Encounter (Signed)
Orders will be faxed for equipment needs

## 2019-11-16 ENCOUNTER — Other Ambulatory Visit: Payer: Self-pay | Admitting: Dermatology

## 2019-11-17 ENCOUNTER — Other Ambulatory Visit: Payer: Self-pay | Admitting: Urology

## 2019-12-05 ENCOUNTER — Ambulatory Visit: Payer: Medicare Other

## 2019-12-19 ENCOUNTER — Ambulatory Visit: Payer: Medicare Other | Admitting: Dermatology

## 2019-12-20 ENCOUNTER — Ambulatory Visit: Payer: Medicare Other | Admitting: Dermatology

## 2020-01-08 ENCOUNTER — Other Ambulatory Visit: Payer: Self-pay | Admitting: Dermatology

## 2020-01-15 ENCOUNTER — Other Ambulatory Visit: Payer: Self-pay | Admitting: Urology

## 2020-02-08 ENCOUNTER — Ambulatory Visit (INDEPENDENT_AMBULATORY_CARE_PROVIDER_SITE_OTHER): Payer: Medicare Other

## 2020-02-08 DIAGNOSIS — Z0000 Encounter for general adult medical examination without abnormal findings: Secondary | ICD-10-CM

## 2020-02-08 NOTE — Patient Instructions (Signed)
Gerald Montgomery , Thank you for taking time to come for your Medicare Wellness Visit. I appreciate your ongoing commitment to your health goals. Please review the following plan we discussed and let me know if I can assist you in the future.   Screening recommendations/referrals: Colonoscopy: done 12/27/17 Recommended yearly ophthalmology/optometry visit for glaucoma screening and checkup Recommended yearly dental visit for hygiene and checkup  Vaccinations: Influenza vaccine: due Pneumococcal vaccine: due at age 77 Tdap vaccine: due Shingles vaccine: Shingrix discussed. Please contact your pharmacy for coverage information.  Covid-19: declined  Conditions/risks identified: Recommend continuing to prevent falls and continue PT and OT  Next appointment: Follow up in one year for your annual wellness visit   Preventive Care 40-64 Years, Male Preventive care refers to lifestyle choices and visits with your health care provider that can promote health and wellness. What does preventive care include?  A yearly physical exam. This is also called an annual well check.  Dental exams once or twice a year.  Routine eye exams. Ask your health care provider how often you should have your eyes checked.  Personal lifestyle choices, including:  Daily care of your teeth and gums.  Regular physical activity.  Eating a healthy diet.  Avoiding tobacco and drug use.  Limiting alcohol use.  Practicing safe sex.  Taking low-dose aspirin every day starting at age 56. What happens during an annual well check? The services and screenings done by your health care provider during your annual well check will depend on your age, overall health, lifestyle risk factors, and family history of disease. Counseling  Your health care provider may ask you questions about your:  Alcohol use.  Tobacco use.  Drug use.  Emotional well-being.  Home and relationship well-being.  Sexual activity.  Eating  habits.  Work and work Statistician. Screening  You may have the following tests or measurements:  Height, weight, and BMI.  Blood pressure.  Lipid and cholesterol levels. These may be checked every 5 years, or more frequently if you are over 83 years old.  Skin check.  Lung cancer screening. You may have this screening every year starting at age 57 if you have a 30-pack-year history of smoking and currently smoke or have quit within the past 15 years.  Fecal occult blood test (FOBT) of the stool. You may have this test every year starting at age 10.  Flexible sigmoidoscopy or colonoscopy. You may have a sigmoidoscopy every 5 years or a colonoscopy every 10 years starting at age 46.  Prostate cancer screening. Recommendations will vary depending on your family history and other risks.  Hepatitis C blood test.  Hepatitis B blood test.  Sexually transmitted disease (STD) testing.  Diabetes screening. This is done by checking your blood sugar (glucose) after you have not eaten for a while (fasting). You may have this done every 1-3 years. Discuss your test results, treatment options, and if necessary, the need for more tests with your health care provider. Vaccines  Your health care provider may recommend certain vaccines, such as:  Influenza vaccine. This is recommended every year.  Tetanus, diphtheria, and acellular pertussis (Tdap, Td) vaccine. You may need a Td booster every 10 years.  Zoster vaccine. You may need this after age 8.  Pneumococcal 13-valent conjugate (PCV13) vaccine. You may need this if you have certain conditions and have not been vaccinated.  Pneumococcal polysaccharide (PPSV23) vaccine. You may need one or two doses if you smoke cigarettes or if you  have certain conditions. Talk to your health care provider about which screenings and vaccines you need and how often you need them. This information is not intended to replace advice given to you by your  health care provider. Make sure you discuss any questions you have with your health care provider. Document Released: 06/07/2015 Document Revised: 01/29/2016 Document Reviewed: 03/12/2015 Elsevier Interactive Patient Education  2017 [place] Prevention in the Home Falls can cause injuries. They can happen to people of all ages. There are many things you can do to make your home safe and to help prevent falls. What can I do on the outside of my home?  Regularly fix the edges of walkways and driveways and fix any cracks.  Remove anything that might make you trip as you walk through a door, such as a raised step or threshold.  Trim any bushes or trees on the path to your home.  Use bright outdoor lighting.  Clear any walking paths of anything that might make someone trip, such as rocks or tools.  Regularly check to see if handrails are loose or broken. Make sure that both sides of any steps have handrails.  Any raised decks and porches should have guardrails on the edges.  Have any leaves, snow, or ice cleared regularly.  Use sand or salt on walking paths during winter.  Clean up any spills in your garage right away. This includes oil or grease spills. What can I do in the bathroom?  Use night lights.  Install grab bars by the toilet and in the tub and shower. Do not use towel bars as grab bars.  Use non-skid mats or decals in the tub or shower.  If you need to sit down in the shower, use a plastic, non-slip stool.  Keep the floor dry. Clean up any water that spills on the floor as soon as it happens.  Remove soap buildup in the tub or shower regularly.  Attach bath mats securely with double-sided non-slip rug tape.  Do not have throw rugs and other things on the floor that can make you trip. What can I do in the bedroom?  Use night lights.  Make sure that you have a light by your bed that is easy to reach.  Do not use any sheets or blankets that are too big  for your bed. They should not hang down onto the floor.  Have a firm chair that has side arms. You can use this for support while you get dressed.  Do not have throw rugs and other things on the floor that can make you trip. What can I do in the kitchen?  Clean up any spills right away.  Avoid walking on wet floors.  Keep items that you use a lot in easy-to-reach places.  If you need to reach something above you, use a strong step stool that has a grab bar.  Keep electrical cords out of the way.  Do not use floor polish or wax that makes floors slippery. If you must use wax, use non-skid floor wax.  Do not have throw rugs and other things on the floor that can make you trip. What can I do with my stairs?  Do not leave any items on the stairs.  Make sure that there are handrails on both sides of the stairs and use them. Fix handrails that are broken or loose. Make sure that handrails are as long as the stairways.  Check any carpeting  to make sure that it is firmly attached to the stairs. Fix any carpet that is loose or worn.  Avoid having throw rugs at the top or bottom of the stairs. If you do have throw rugs, attach them to the floor with carpet tape.  Make sure that you have a light switch at the top of the stairs and the bottom of the stairs. If you do not have them, ask someone to add them for you. What else can I do to help prevent falls?  Wear shoes that:  Do not have high heels.  Have rubber bottoms.  Are comfortable and fit you well.  Are closed at the toe. Do not wear sandals.  If you use a stepladder:  Make sure that it is fully opened. Do not climb a closed stepladder.  Make sure that both sides of the stepladder are locked into place.  Ask someone to hold it for you, if possible.  Clearly mark and make sure that you can see:  Any grab bars or handrails.  First and last steps.  Where the edge of each step is.  Use tools that help you move around  (mobility aids) if they are needed. These include:  Canes.  Walkers.  Scooters.  Crutches.  Turn on the lights when you go into a dark area. Replace any light bulbs as soon as they burn out.  Set up your furniture so you have a clear path. Avoid moving your furniture around.  If any of your floors are uneven, fix them.  If there are any pets around you, be aware of where they are.  Review your medicines with your doctor. Some medicines can make you feel dizzy. This can increase your chance of falling. Ask your doctor what other things that you can do to help prevent falls. This information is not intended to replace advice given to you by your health care provider. Make sure you discuss any questions you have with your health care provider. Document Released: 03/07/2009 Document Revised: 10/17/2015 Document Reviewed: 06/15/2014 Elsevier Interactive Patient Education  2017 [employer].

## 2020-02-08 NOTE — Progress Notes (Signed)
Subjective:   Gerald Montgomery is a 64 y.o. male who presents for Medicare Annual/Subsequent preventive examination.  Virtual Visit via Telephone Note  I connected with  [name] on 02/08/20 at  9:20 AM EDT by telephone and verified that I am speaking with the correct person using two identifiers.  Medicare Annual Wellness visit completed telephonically due to Covid-19 pandemic.   Location: Patient: home Provider: [place]   I discussed the limitations, risks, security and privacy concerns of performing an evaluation and management service by telephone and the availability of in person appointments. The patient expressed understanding and agreed to proceed.  Unable to perform video visit due to video visit attempted and failed and/or patient does not have video capability.   Some vital signs may be absent or patient reported.   Clemetine Marker, LPN    Review of Systems     Cardiac Risk Factors include: advanced age (>89men, >50 women);male gender;sedentary lifestyle     Objective:    Today's Vitals   02/08/20 0926  PainSc: 5    There is no height or weight on file to calculate BMI.  Advanced Directives 02/08/2020 08/05/2018 12/27/2017 10/28/2017 08/24/2017 07/07/2017 06/07/2017  Does Patient Have a Medical Advance Directive? No Yes No No No Yes Yes  Type of Advance Directive - Healthcare Power of [place]  Does patient want to make changes to medical advance directive? - - - No - Patient declined No - Patient declined No - Patient declined -  Copy of [place] in Chart? - Yes - validated most recent copy scanned in chart (See row information) - No - copy requested No - copy requested No - copy requested No - copy requested  Would patient like information on creating a medical advance directive? No - Patient declined - No - Patient declined No - Patient declined No - Patient declined No - Patient  declined -    Current Medications (verified) Outpatient Encounter Medications as of 02/08/2020  Medication Sig  . acetaminophen (TYLENOL) 325 MG tablet Take 1 tablet (325 mg total) by mouth every 6 (six) hours as needed for mild pain (or Fever >/= 101).  . ARIPiprazole (ABILIFY) 15 MG tablet TAKE 1 TABLET BY MOUTH ONCE DAILY  . cromolyn (OPTICROM) 4 % ophthalmic solution Place 1 drop into both eyes 4 (four) times daily as needed (for eye itching burning and drainage/allergies).  Marland Kitchen esomeprazole (NEXIUM) 40 MG capsule Take 1 capsule (40 mg total) by mouth daily before breakfast.  . fluticasone (FLONASE) 50 MCG/ACT nasal spray Place into both nostrils daily.  Marland Kitchen loratadine (CLARITIN) 10 MG tablet Take 10 mg by mouth daily.  . metroNIDAZOLE (METROGEL) 1 % gel APPLY A SMALL AMOUNT TO AFFECTED AREA TWICE DAILY  . nystatin cream (MYCOSTATIN)   . oxybutynin (DITROPAN) 5 MG tablet TAKE 1/2 TABLET BY MOUTH TWICE DAILY AS NEEDED BLADDER SPASM  . Red Yeast Rice 600 MG CAPS Take by mouth.  Alveda Reasons 10 MG TABS tablet TAKE 1 TABLET BY MOUTH ONCE DAILY  . [DISCONTINUED] bisacodyl (DULCOLAX) 5 MG EC tablet Take 1 tablet (5 mg total) by mouth daily as needed for moderate constipation. (Patient not taking: Reported on 10/05/2019)  . [DISCONTINUED] nystatin (MYCOSTATIN/NYSTOP) powder Apply topically 3 (three) times daily. To affected rash   Facility-Administered Encounter Medications as of 02/08/2020  Medication  . lidocaine (XYLOCAINE) 2 % jelly 1 application  Allergies (verified) Macrobid [nitrofurantoin], Penicillins, and Sulfa antibiotics   History: Past Medical History:  Diagnosis Date  . Cerebral palsy ([place])   . Depression   . GERD (gastroesophageal reflux disease)   . History of DVT of lower extremity   . Mood disorder ([place])   . Neurogenic bladder   . Urinary incontinence    Past Surgical History:  Procedure Laterality Date  . COLONOSCOPY WITH PROPOFOL N/A 12/27/2017   Procedure:  COLONOSCOPY WITH PROPOFOL;  Surgeon: Lin Landsman, MD;  Location: [facility] ENDOSCOPY;  Service: Gastroenterology;  Laterality: N/A;  . ESOPHAGOGASTRODUODENOSCOPY Left 02/07/2017   Procedure: ESOPHAGOGASTRODUODENOSCOPY (EGD);  Surgeon: Thornton Park, MD;  Location: [place];  Service: Gastroenterology;  Laterality: Left;  . ESOPHAGOGASTRODUODENOSCOPY (EGD) WITH PROPOFOL  12/27/2017   Procedure: ESOPHAGOGASTRODUODENOSCOPY (EGD) WITH PROPOFOL;  Surgeon: Lin Landsman, MD;  Location: [facility] ENDOSCOPY;  Service: Gastroenterology;;  . filter surgery for blood clot  2013  . HIP FRACTURE SURGERY  2013  . IR CATHETER TUBE CHANGE  07/07/2017   Family History  Problem Relation Age of Onset  . Heart disease Mother   . Atrial fibrillation Mother   . Diabetes Mother        Borderline  . Heart disease Father        Passed away of heart attack  . Kidney disease Neg Hx   . Prostate cancer Neg Hx    Social History   Socioeconomic History  . Marital status: Single    Spouse name: Not on file  . Number of children: Not on file  . Years of education: Not on file  . Highest education level: Not on file  Occupational History  . Occupation: disabled  Tobacco Use  . Smoking status: Never Smoker  . Smokeless tobacco: Never Used  Vaping Use  . Vaping Use: Never used  Substance and Sexual Activity  . Alcohol use: No    Alcohol/week: 0.0 standard drinks  . Drug use: No  . Sexual activity: Never  Other Topics Concern  . Not on file  Social History Narrative  . Not on file   Social Determinants of Health   Financial Resource Strain: Low Risk   . Difficulty of Paying Living Expenses: Not hard at all  Food Insecurity: No Food Insecurity  . Worried About Charity fundraiser in the Last Year: Never true  . Ran Out of Food in the Last Year: Never true  Transportation Needs: No Transportation Needs  . Lack of Transportation (Medical): No  . Lack of Transportation (Non-Medical): No    Physical Activity: Inactive  . Days of Exercise per Week: 0 days  . Minutes of Exercise per Session: 0 min  Stress: No Stress Concern Present  . Feeling of Stress : Not at all  Social Connections: Socially Isolated  . Frequency of Communication with Friends and Family: More than three times a week  . Frequency of Social Gatherings with Friends and Family: More than three times a week  . Attends Religious Services: Never  . Active Member of Clubs or Organizations: No  . Attends Archivist Meetings: Never  . Marital Status: Never married    Tobacco Counseling Counseling given: Not Answered   Clinical Intake:  Pre-visit preparation completed: Yes  Pain : 0-10 Pain Score: 5  Pain Type: Chronic pain Pain Location: Leg Pain Orientation: Left Pain Descriptors / Indicators: Aching, Discomfort, Sore Pain Onset: More than a month ago Pain Frequency: Constant     Nutritional  Risks: None Diabetes: No  How often do you need to have someone help you when you read instructions, pamphlets, or other written materials from your doctor or pharmacy?: 4 - Often    Interpreter Needed?: No  Information entered by :: Clemetine Marker LPN   Activities of Daily Living In your present state of health, do you have any difficulty performing the following activities: 02/08/2020 10/05/2019  Hearing? N N  Comment declines hearing aids -  Vision? N N  Difficulty concentrating or making decisions? Y N  Walking or climbing stairs? Y Y  Dressing or bathing? Y Y  Doing errands, shopping? Tempie Donning  Preparing Food and eating ? Y -  Using the Toilet? Y -  In the past six months, have you accidently leaked urine? Y -  Do you have problems with loss of bowel control? Y -  Managing your Medications? Y -  Managing your Finances? Y -  Housekeeping or managing your Housekeeping? Y -  Some recent data might be hidden    Patient Care Team: Delsa Grana, PA-C as PCP - General (Family  Medicine) Nickie Retort, MD as Consulting Physician (Urology)  Indicate any recent Medical Services you may have received from other than [facility] providers in the past year (date may be approximate).     Assessment:   This is a routine wellness examination for [place].  Hearing/Vision screen  Hearing Screening   125Hz  250Hz  500Hz  1000Hz  2000Hz  3000Hz  4000Hz  6000Hz  8000Hz   Right ear:           Left ear:           Comments: Pt denies hearing difficulty  Vision Screening Comments: Not established with eye provider  Dietary issues and exercise activities discussed: Current Exercise Habits: The patient does not participate in regular exercise at present, Exercise limited by: neurologic condition(s);orthopedic condition(s)  Goals   None    Depression Screen PHQ 2/9 Scores 02/08/2020 10/05/2019 03/21/2019 08/05/2018 02/18/2018 08/03/2017 05/31/2017  PHQ - 2 Score 0 2 0 0 0 0 0  PHQ- 9 Score - 6 0 - 0 - -    Fall Risk Fall Risk  02/08/2020 10/05/2019 03/21/2019 08/05/2018 02/18/2018  Falls in the past year? 0 0 0 0 No  Number falls in past yr: 0 0 0 0 -  Injury with Fall? 0 0 0 0 -  Risk for fall due to : Impaired balance/gait;Impaired mobility - - - -  Follow up Falls prevention discussed - - Falls evaluation completed -    Any stairs in or around the home? No  If so, are there any without handrails? No  Home free of loose throw rugs in walkways, pet beds, electrical cords, etc? Yes  Adequate lighting in your home to reduce risk of falls? Yes   ASSISTIVE DEVICES UTILIZED TO PREVENT FALLS:  Life alert? No  Use of a cane, walker or w/c? Yes  Grab bars in the bathroom? No  Shower chair or bench in shower? No  Elevated toilet seat or a handicapped toilet? No   TIMED UP AND GO:  Was the test performed? No . Telephonic visit.   Cognitive Function: 6CIT deferred for 2021 AWV        Immunizations Immunization History  Administered Date(s) Administered  . Influenza,inj,Quad  PF,6+ Mos 03/04/2016, 02/06/2017, 02/18/2018, 01/26/2019    TDAP status: Due, Education has been provided regarding the importance of this vaccine. Advised may receive this vaccine at local pharmacy or Health Dept.  Aware to provide a copy of the vaccination record if obtained from local pharmacy or Health Dept. Verbalized acceptance and understanding.   Flu Vaccine status: Declined, Education has been provided regarding the importance of this vaccine but patient still declined. Advised may receive this vaccine at local pharmacy or Health Dept. Aware to provide a copy of the vaccination record if obtained from local pharmacy or Health Dept. Verbalized acceptance and understanding.   Pneumococcal vaccine status: due at age 14  Covid-19 vaccine status: Declined, Education has been provided regarding the importance of this vaccine but patient still declined. Advised may receive this vaccine at local pharmacy or Health Dept.or vaccine clinic. Aware to provide a copy of the vaccination record if obtained from local pharmacy or Health Dept. Verbalized acceptance and understanding.  Qualifies for Shingles Vaccine? Yes   Zostavax completed No   Shingrix Completed?: No.    Education has been provided regarding the importance of this vaccine. Patient has been advised to call insurance company to determine out of pocket expense if they have not yet received this vaccine. Advised may also receive vaccine at local pharmacy or Health Dept. Verbalized acceptance and understanding.  Screening Tests Health Maintenance  Topic Date Due  . Hepatitis C Screening  Never done  . COVID-19 Vaccine (1) Never done  . INFLUENZA VACCINE  12/24/2019  . TETANUS/TDAP  10/07/2023 (Originally 01/29/1975)  . COLONOSCOPY  12/28/2027  . HIV Screening  Completed    Health Maintenance  Health Maintenance Due  Topic Date Due  . Hepatitis C Screening  Never done  . COVID-19 Vaccine (1) Never done  . INFLUENZA VACCINE   12/24/2019    Colorectal cancer screening: Completed 12/27/17. Repeat every 10 years  Lung Cancer Screening: (Low Dose CT Chest recommended if Age 3-80 years, 30 pack-year currently smoking OR have quit w/in 15years.) does not qualify.    Additional Screening:  Hepatitis C Screening: does qualify; postponed  Vision Screening: Recommended annual ophthalmology exams for early detection of glaucoma and other disorders of the eye. Is the patient up to date with their annual eye exam?  No  Who is the provider or what is the name of the office in which the patient attends annual eye exams? Not established  If pt is not established with a provider, would they like to be referred to a provider to establish care? No .   Dental Screening: Recommended annual dental exams for proper oral hygiene  Community Resource Referral / Chronic Care Management: CRR required this visit?  No   CCM required this visit?  No      Plan:     I have personally reviewed and noted the following in the patient's chart:   . Medical and social history . Use of alcohol, tobacco or illicit drugs  . Current medications and supplements . Functional ability and status . Nutritional status . Physical activity . Advanced directives . List of other physicians . Hospitalizations, surgeries, and ER visits in previous 12 months . Vitals . Screenings to include cognitive, depression, and falls . Referrals and appointments  In addition, I have reviewed and discussed with patient certain preventive protocols, quality metrics, and best practice recommendations. A written personalized care plan for preventive services as well as general preventive health recommendations were provided to patient.     Clemetine Marker, LPN   2/95/1884   Nurse Notes: pt accompanied during visit by sister, [name]. Pt c/o itching and irritation on his bottom and does not  like to be placed on his side. [name] stated they have tried multiple OTC  products recommended by pharmacist (desitin, calmoseptine, calamine, boudreaux's butt paste, hydrocortisone, aquaphor, etc.) advised to possibly try diffierent brand or type of adult diaper to see if that helps as well as contact dermatology for recommendations.

## 2020-02-19 ENCOUNTER — Telehealth: Payer: Self-pay | Admitting: Family Medicine

## 2020-02-19 NOTE — Telephone Encounter (Signed)
Will consult with SP and get back to pt  I will need to review [facility] documentation and we have not had a OV face-to-face since May 2021.  Delsa Grana, PA-C

## 2020-02-19 NOTE — Telephone Encounter (Signed)
Pt's sister called in for assistance from provider, she has spoken with pt's insurance and was told to request a Rx for a  lift kit. Sister says that Medicare is requesting a  Rx and a letter stating pt's currently health condition and how he is declining she says that pt is unable to walk or  use the left side of his body.   please fax to [employer] [phone]  With Medicare   Please assist.

## 2020-02-20 NOTE — Telephone Encounter (Signed)
appt scheduled with Kristeen Miss for this coming Thursday

## 2020-02-21 NOTE — Progress Notes (Signed)
Name: Gerald Montgomery   MRN: [id number]    DOB: 07/06/1955   Date:02/22/2020       Progress Note  Subjective:    Chief Complaint  Chief Complaint  Patient presents with  . Letter for School/Work    and prescription for hoyer lift    I connected with  [name]  on 02/22/20 at  1:20 PM EDT by a video enabled telemedicine application and verified that I am speaking with the correct person using two identifiers.  I discussed the limitations of evaluation and management by telemedicine and the availability of in person appointments. The patient expressed understanding and agreed to proceed. Staff also discussed with the patient that there may be a patient responsible charge related to this service. Patient Location: home Provider Location: [facility] office Additional Individuals present: sister   HPI Gerald Montgomery is a 64 y/o male presents with request to get lift order to help with caring for the pt in his home.  He has history of cerebral palsy he is wheelchair-bound, he does have home health OT and home health nursing that helps specifically with neurogenic bladder and management of indwelling suprapubic tube which she has had for several years.  His sister presents with him today, she is his primary caregiver, [name] on his DPR. Recent in office visit patient sister inquired about reordering home health to help them understand how to use his wheelchair.  His PCP had ordered the wheelchair just prior to her leaving the clinic in early 2020 at the start of Covid pandemic, since May of this year home health PT and did go out for an assessment but determined that his deteriorating condition did not require any PT and HH OT and nursing has been working with the patient and his sister for the past 4 months.  Pt's sister called in for assistance a few days ago stating that she spoke with pt's insurance and was told to request a Rx for a  lift kit. Sister says that Medicare is  requesting a  Rx and a letter stating pt's currently health condition and how he is declining she says that pt is unable to walk or  use the left side of his body.   please fax to [employer] [phone]  With Medicare  Pt is completely dependent on sister for all weight transfers, rolling and position changes to avoid pressure ulcers.  He can sit up for a few hours if she helps him, but he cannot sit up any longer due to skin breakdown.  He otherwise requires full assistance for feeding, bathing, clothing [facility] OT and nursing has continued to work with the pt [name] with He is experiencing decline in mobility, increased weakness, contractures and mobility on left side of his body.     Patient Active Problem List   Diagnosis Date Noted  . Hyperlipidemia, unspecified 03/23/2019  . Iron deficiency anemia 08/24/2017  . Hypochromic microcytic anemia 08/03/2017  . GERD (gastroesophageal reflux disease) 08/03/2017  . Stage 1 decubitus ulcer 08/03/2017  . Abnormal urine odor 07/16/2016  . Need for home health care 06/17/2016  . Hyperglycemia 03/04/2016  . Screening for lipid disorders 03/04/2016  . Cerumen impaction 02/12/2016  . Cerebral palsy ([place]) 12/05/2015  . History of DVT of lower extremity 04/11/2015  . Mood disorder ([place]) 04/11/2015  . Neurogenic bladder 04/11/2015  . Monitoring for long-term anticoagulant use 04/11/2015    Social History   Tobacco Use  .  Smoking status: Never Smoker  . Smokeless tobacco: Never Used  Substance Use Topics  . Alcohol use: No    Alcohol/week: 0.0 standard drinks     Current Outpatient Medications:  .  acetaminophen (TYLENOL) 325 MG tablet, Take 1 tablet (325 mg total) by mouth every 6 (six) hours as needed for mild pain (or Fever >/= 101)., Disp: , Rfl:  .  ARIPiprazole (ABILIFY) 15 MG tablet, TAKE 1 TABLET BY MOUTH ONCE DAILY, Disp: 90 tablet, Rfl: 3 .  cromolyn (OPTICROM) 4 % ophthalmic solution, Place 1 drop into both eyes 4 (four) times  daily as needed (for eye itching burning and drainage/allergies)., Disp: 10 mL, Rfl: 0 .  esomeprazole (NEXIUM) 40 MG capsule, Take 1 capsule (40 mg total) by mouth daily before breakfast., Disp: 30 capsule, Rfl: 11 .  fluticasone (FLONASE) 50 MCG/ACT nasal spray, Place into both nostrils daily., Disp: , Rfl:  .  loratadine (CLARITIN) 10 MG tablet, Take 10 mg by mouth daily., Disp: , Rfl:  .  metroNIDAZOLE (METROGEL) 1 % gel, APPLY A SMALL AMOUNT TO AFFECTED AREA TWICE DAILY, Disp: 60 g, Rfl: 0 .  nystatin cream (MYCOSTATIN), , Disp: , Rfl:  .  oxybutynin (DITROPAN) 5 MG tablet, TAKE 1/2 TABLET BY MOUTH TWICE DAILY AS NEEDED BLADDER SPASM, Disp: 60 tablet, Rfl: 0 .  Red Yeast Rice 600 MG CAPS, Take by mouth., Disp: , Rfl:  .  XARELTO 10 MG TABS tablet, TAKE 1 TABLET BY MOUTH ONCE DAILY, Disp: 30 tablet, Rfl: 5  Current Facility-Administered Medications:  .  lidocaine (XYLOCAINE) 2 % jelly 1 application, 1 application, Urethral, Once, Stoioff, Scott C, MD  Allergies  Allergen Reactions  . Macrobid [Nitrofurantoin]   . Penicillins     Sickness/sweating  . Sulfa Antibiotics Itching    I personally reviewed active problem list, medication list, allergies, family history, social history, health maintenance, notes from last encounter, lab results, imaging with the patient/caregiver today.   Review of Systems  10 Systems reviewed and are negative for acute change except as noted in the HPI.   Objective:   Virtual encounter, vitals limited, only able to obtain the following Today's Vitals   02/22/20 1319  Weight: 118 lb (53.5 kg)   Body mass index is 19.05 kg/m. Nursing Note and Vital Signs reviewed.  Physical Exam Patient observed lying flat in hospital bed in his home alert to the voice of his sister and able to make eye contact with the phone but when asked to say hello began to cry PE limited by telephone encounter  No results found for this or any previous visit (from the past  72 hour(s)).  Assessment and Plan:     ICD-10-CM   1. Cerebral palsy, unspecified type ([place] [address])  G80.9 For home use only DME Hospital bed    Patient presented via virtual encounter with his sister and primary caregiver present, they present for paperwork and orders for a lift in a hospital bed  will coordinate requested orders with HHOT:  [name] ([facility]) [phone] she has assessed the patient for the past couple years and has been working with him recently.   Patient is unable to stand at all, no mobility currently, unable to reposition himself in bed, high fall risk due to deconditioning and progression of left-sided contractures and weakness, lifelong cerebral palsy,  CP has affected both legs for most of his life-cannot ambulate with assist, with progressive left upper extremity CP symptoms Progression: worsening, decline in  function worsening generalized weakness Other diagnoses: Neurogenic bladder secondary to cerebral palsy with indwelling suprapubic tube Hx of falls, none in the past year  CMA reaching out to [place] to request more info for specific DME orders and recent eval/assessment    - I discussed the assessment and treatment plan with the patient. The patient was provided an opportunity to ask questions and all were answered. The patient agreed with the plan and demonstrated an understanding of the instructions.  I provided 30+ minutes of non-face-to-face time during this encounter.  Delsa Grana, PA-C 02/22/20 1:50 PM

## 2020-02-22 ENCOUNTER — Other Ambulatory Visit: Payer: Self-pay

## 2020-02-22 ENCOUNTER — Encounter: Payer: Self-pay | Admitting: Family Medicine

## 2020-02-22 ENCOUNTER — Telehealth (INDEPENDENT_AMBULATORY_CARE_PROVIDER_SITE_OTHER): Payer: Medicare Other | Admitting: Family Medicine

## 2020-02-22 VITALS — Wt 118.0 lb

## 2020-02-22 DIAGNOSIS — G809 Cerebral palsy, unspecified: Secondary | ICD-10-CM | POA: Diagnosis not present

## 2020-02-22 DIAGNOSIS — Z9181 History of falling: Secondary | ICD-10-CM | POA: Diagnosis not present

## 2020-02-22 DIAGNOSIS — R531 Weakness: Secondary | ICD-10-CM

## 2020-02-23 ENCOUNTER — Other Ambulatory Visit: Payer: Self-pay

## 2020-02-23 DIAGNOSIS — Z9181 History of falling: Secondary | ICD-10-CM

## 2020-02-23 DIAGNOSIS — R531 Weakness: Secondary | ICD-10-CM

## 2020-02-23 DIAGNOSIS — G809 Cerebral palsy, unspecified: Secondary | ICD-10-CM

## 2020-02-24 ENCOUNTER — Other Ambulatory Visit: Payer: Self-pay | Admitting: Dermatology

## 2020-02-24 ENCOUNTER — Other Ambulatory Visit: Payer: Self-pay | Admitting: Urology

## 2020-02-24 ENCOUNTER — Other Ambulatory Visit: Payer: Self-pay | Admitting: Family Medicine

## 2020-03-11 ENCOUNTER — Ambulatory Visit (INDEPENDENT_AMBULATORY_CARE_PROVIDER_SITE_OTHER): Payer: Medicare Other

## 2020-03-11 ENCOUNTER — Other Ambulatory Visit: Payer: Self-pay

## 2020-03-11 DIAGNOSIS — Z23 Encounter for immunization: Secondary | ICD-10-CM | POA: Diagnosis not present

## 2020-03-20 ENCOUNTER — Telehealth: Payer: Self-pay

## 2020-03-20 NOTE — Telephone Encounter (Signed)
Copied from [place] [id number]. Topic: General - Other >> Mar 20, 2020  1:42 PM Leward Quan A wrote: Reason for CRM: Patient sister and POA called in to inquire if Delsa Grana had any info on the urine that was collected on 03/18/20 due to patient having blood in his urine on and off. Please call Duke Salvia at Ph# [phone]

## 2020-03-21 NOTE — Telephone Encounter (Signed)
Patient caregiver stated that [place] from [place] came out and took specimen. She stated Dr.Stioff ordered specimen. Will call Urology for results.

## 2020-03-22 ENCOUNTER — Encounter: Payer: Self-pay | Admitting: Emergency Medicine

## 2020-03-22 ENCOUNTER — Telehealth: Payer: Self-pay

## 2020-03-22 NOTE — Telephone Encounter (Signed)
Notified patient as instructed,.  

## 2020-03-22 NOTE — Telephone Encounter (Signed)
Patient's guardian called wanting to know if we were going to treat UX that was taken by home this week. Do not see results in chart Results should be sent today via fax if finalized.  Spoke with [name] concerned with increase of blood in bag. Pink lemonade noted instructed to increase fluids. Reassured that blood is to be expected with catheter and blood thinners.  Denies fever, chills, nausea/vomiting, no altered mental status.

## 2020-03-22 NOTE — Telephone Encounter (Signed)
Chronic colonization with bacteria as expected with an indwelling suprapubic tube and would not treat unless he is having specific symptoms of infection including fever, altered mental status or increasing bladder spasms.  We have not received the urine culture results

## 2020-03-25 ENCOUNTER — Other Ambulatory Visit: Payer: Self-pay | Admitting: Dermatology

## 2020-03-25 ENCOUNTER — Encounter: Payer: Self-pay | Admitting: Emergency Medicine

## 2020-03-25 ENCOUNTER — Encounter: Payer: Self-pay | Admitting: Family Medicine

## 2020-03-26 ENCOUNTER — Telehealth: Payer: Self-pay

## 2020-03-26 ENCOUNTER — Other Ambulatory Visit: Payer: Self-pay | Admitting: Emergency Medicine

## 2020-03-26 DIAGNOSIS — Z86718 Personal history of other venous thrombosis and embolism: Secondary | ICD-10-CM

## 2020-03-26 MED ORDER — RIVAROXABAN 10 MG PO TABS: 10.0000 mg | ORAL_TABLET | Freq: Every day | ORAL | 0 refills | Status: DC

## 2020-03-26 NOTE — Telephone Encounter (Signed)
Copied from [place] [id number]. Topic: General - Call Back - No Documentation >> Mar 26, 2020 12:16 PM Erick Blinks wrote: Cecille Rubin from [facility] wants to know if pt needs an appt with PCP or Urologist, they are still waiting for his UA results  Cecille Rubin from [facility]: [phone]

## 2020-03-26 NOTE — Telephone Encounter (Signed)
Lori notified to schedule with Urologist

## 2020-03-27 NOTE — Telephone Encounter (Signed)
Informed that prescription has been sent to pharmacy. They will call back to schedule appt within the next 30days for in office appt.

## 2020-04-05 ENCOUNTER — Other Ambulatory Visit: Payer: Self-pay | Admitting: Dermatology

## 2020-04-09 ENCOUNTER — Other Ambulatory Visit: Payer: Self-pay | Admitting: Dermatology

## 2020-04-09 ENCOUNTER — Ambulatory Visit: Payer: Medicare Other | Admitting: Family Medicine

## 2020-04-10 ENCOUNTER — Other Ambulatory Visit: Payer: Self-pay

## 2020-04-10 ENCOUNTER — Encounter: Payer: Self-pay | Admitting: Family Medicine

## 2020-04-10 ENCOUNTER — Ambulatory Visit (INDEPENDENT_AMBULATORY_CARE_PROVIDER_SITE_OTHER): Payer: Medicare Other | Admitting: Family Medicine

## 2020-04-10 VITALS — BP 124/86 | HR 85 | Temp 98.1°F | Resp 18

## 2020-04-10 DIAGNOSIS — D509 Iron deficiency anemia, unspecified: Secondary | ICD-10-CM

## 2020-04-10 DIAGNOSIS — F39 Unspecified mood [affective] disorder: Secondary | ICD-10-CM

## 2020-04-10 DIAGNOSIS — E7800 Pure hypercholesterolemia, unspecified: Secondary | ICD-10-CM | POA: Diagnosis not present

## 2020-04-10 DIAGNOSIS — R21 Rash and other nonspecific skin eruption: Secondary | ICD-10-CM

## 2020-04-10 DIAGNOSIS — L89151 Pressure ulcer of sacral region, stage 1: Secondary | ICD-10-CM

## 2020-04-10 DIAGNOSIS — F321 Major depressive disorder, single episode, moderate: Secondary | ICD-10-CM

## 2020-04-10 DIAGNOSIS — J302 Other seasonal allergic rhinitis: Secondary | ICD-10-CM | POA: Diagnosis not present

## 2020-04-10 DIAGNOSIS — Z7901 Long term (current) use of anticoagulants: Secondary | ICD-10-CM

## 2020-04-10 DIAGNOSIS — Z5181 Encounter for therapeutic drug level monitoring: Secondary | ICD-10-CM

## 2020-04-10 DIAGNOSIS — K219 Gastro-esophageal reflux disease without esophagitis: Secondary | ICD-10-CM

## 2020-04-10 DIAGNOSIS — Z86718 Personal history of other venous thrombosis and embolism: Secondary | ICD-10-CM

## 2020-04-10 DIAGNOSIS — G809 Cerebral palsy, unspecified: Secondary | ICD-10-CM

## 2020-04-10 DIAGNOSIS — Z9359 Other cystostomy status: Secondary | ICD-10-CM

## 2020-04-10 MED ORDER — CITALOPRAM HYDROBROMIDE 20 MG PO TABS: ORAL_TABLET | ORAL | 1 refills | Status: DC

## 2020-04-10 MED ORDER — NYSTATIN 100000 UNIT/GM EX POWD: 1.0000 "application " | Freq: Three times a day (TID) | CUTANEOUS | 1 refills | Status: DC | PRN

## 2020-04-10 NOTE — Progress Notes (Signed)
Name: Gerald Montgomery   MRN: [id number]    DOB: [birth year]   Date:04/10/2020       Progress Note  Chief Complaint  Patient presents with  . Rash    on buttock  . Consult    letter for medical neccesity for lift chair and bed     Subjective:   Gerald Montgomery is a 64 y.o. male, presents to clinic for routine f/up  GERD- well controlled with nexium -patient does take the medications daily and he denies any indigestion epigastric pain difficulty swallowing  History of hyperlipidemia not on medications Note on chart alerts that he has atherosclerotic cardiovascular disease -the patient's daughter is here says she has been managing this with red rice yeast twice a day?  Also states that she was told his cholesterol was well controlled and looking great? Lab Results  Component Value Date   CHOL 191 10/05/2019   HDL 45 10/05/2019   LDLCALC 129 (H) 10/05/2019   TRIG 80 10/05/2019   CHOLHDL 4.2 10/05/2019    The 10-year ASCVD risk score Mikey Bussing DC Jr., et al., 2013) is: 10.7%   Values used to calculate the score:     Age: 45 years     Sex: Male     Is Non-Hispanic African American: No     Diabetic: No     Tobacco smoker: No     Systolic Blood Pressure: 376 mmHg     Is BP treated: No     HDL Cholesterol: 45 mg/dL     Total Cholesterol: 191 mg/dL  Patient's mood is worse he is feeling depressed struggles with getting out of bed in the morning with his progressively worsening chronic conditions and inability to be mobile anymore.  His behavior is good but he does tend to be down multiple times a day.  His daughter states that he will even get upset or cry at commercials.  He has been managed on Abilify by his prior PCP, we have continued this as there would be greater risk of stopping medication than continuing medications and he is not currently seeing a psychiatrist.  Patient did PHQ screening with his family present, hesitant to discuss moods or depression today Depression screen  [facility] 2/9 04/10/2020 02/22/2020 02/08/2020  Decreased Interest 0 0 0  Down, Depressed, Hopeless 0 0 0  PHQ - 2 Score 0 0 0  Altered sleeping - 0 -  Tired, decreased energy - 0 -  Change in appetite - 0 -  Feeling bad or failure about yourself  - 0 -  Trouble concentrating - 0 -  Moving slowly or fidgety/restless - 0 -  Suicidal thoughts - 0 -  PHQ-9 Score - 0 -  Difficult doing work/chores - Not difficult at all -   Rash and wound to buttock -he has a history of stage I pressure ulcers and nonspecific rash that had improved in the past with application of nystatin creams patient's daughter has also been trying to manage with over-the-counter steroids or other salves.  He is wearing briefs daily for incontinence she notes that he has sweating inside of these and has a rash and inflammation where they contact his skin on his thighs and around his buttocks and sacrum area he had a lot of broken skin due to aggressive the scratching and itching  Suprapubic catheter secondary to cerebral palsy and neurogenic bladder, managed by urology and at home care done by home health skilled nursing  History of  cerebral palsy with progressive weakness he is in a bed or a wheelchair hydration the time now cannot transfer at all without full assistance.  Home health has been coming to his home family in the room reports that they need a letter for medical necessity in order for a lift however I reviewed all of our scanned in documents that we have been receiving over the past 6 months and I do not see any request for this or assessments or orders for me to sign.  This is a according to patient's daughter who is speaking with Herbie Baltimore and Jinny Blossom, did call [name] with the home health team today to get more information  Social worker Jaquann Linens CAP?  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  acetaminophen (TYLENOL) 325 MG tablet, Take 1 tablet (325 mg total) by mouth every 6 (six) hours as needed for mild pain (or Fever >/= 101)., Disp: , Rfl:  .  ARIPiprazole (ABILIFY) 15 MG tablet, TAKE 1 TABLET BY MOUTH ONCE DAILY, Disp: 90 tablet, Rfl: 3 .  cromolyn (OPTICROM) 4 % ophthalmic solution, Place 1 drop into both eyes 4 (four) times daily as needed (for eye itching burning and drainage/allergies)., Disp: 10 mL, Rfl: 0 .  esomeprazole (NEXIUM) 40 MG capsule, Take 1 capsule (40 mg total) by mouth daily before breakfast., Disp: 30 capsule, Rfl: 11 .  fluticasone (FLONASE) 50 MCG/ACT nasal spray, Place into both nostrils daily., Disp: , Rfl:  .  loratadine (CLARITIN) 10 MG tablet, Take 10 mg by mouth daily., Disp: , Rfl:  .  metroNIDAZOLE (METROGEL) 1 % gel, APPLY A SMALL AMOUNT TO AFFECTED AREA TWICE DAILY, Disp: 60 g, Rfl: 0 .  nystatin cream (MYCOSTATIN), APPLY TOPICALLY 3 TIMES DAILY TO AFFECTED AREA, Disp: 30 g, Rfl: 2 .  oxybutynin (DITROPAN) 5 MG tablet, TAKE 1/2 TABLET BY MOUTH TWICE DAILY AS NEEDED BLADDER SPASM, Disp: 60 tablet, Rfl: 0 .  Red Yeast Rice 600 MG CAPS, Take by mouth., Disp: , Rfl:  .  rivaroxaban (XARELTO) 10 MG TABS tablet, Take 1 tablet (10 mg total) by mouth daily., Disp: 30 tablet, Rfl: 0  Current Facility-Administered Medications:  .  lidocaine (XYLOCAINE) 2 % jelly 1 application, 1 application, Urethral, Once, Stoioff, Ronda Fairly, MD  Patient Active Problem List   Diagnosis Date Noted  . Hyperlipidemia, unspecified 03/23/2019  . Iron deficiency anemia  08/24/2017  . Hypochromic microcytic anemia 08/03/2017  . GERD (gastroesophageal reflux disease) 08/03/2017  . Stage 1 decubitus ulcer 08/03/2017  . Abnormal urine odor 07/16/2016  . Need for home health care 06/17/2016  . Hyperglycemia 03/04/2016  . Screening for lipid disorders 03/04/2016  . Cerumen impaction 02/12/2016  . Cerebral palsy ([place]) 12/05/2015  . History of DVT of lower extremity 04/11/2015  . Mood disorder ([place]) 04/11/2015  . Neurogenic bladder 04/11/2015  . Monitoring for long-term anticoagulant use 04/11/2015    Past Surgical History:  Procedure Laterality Date  . COLONOSCOPY WITH PROPOFOL N/A 12/27/2017   Procedure: COLONOSCOPY WITH PROPOFOL;  Surgeon: Lin Landsman, MD;  [facility] ENDOSCOPY;  Service: Gastroenterology;  Laterality: N/A;  . ESOPHAGOGASTRODUODENOSCOPY Left 02/07/2017   Procedure: ESOPHAGOGASTRODUODENOSCOPY (EGD);  Surgeon: Thornton Park, MD;  Location: [facility];  Service: Gastroenterology;  Laterality: Left;  . ESOPHAGOGASTRODUODENOSCOPY (EGD) WITH PROPOFOL  12/27/2017   Procedure: ESOPHAGOGASTRODUODENOSCOPY (EGD) WITH PROPOFOL;  Surgeon: Lin Landsman, MD;  Location: [facility] ENDOSCOPY;  Service: Gastroenterology;;  . filter surgery for blood clot  2013  . HIP FRACTURE SURGERY  2013  . IR CATHETER TUBE CHANGE  07/07/2017    Family History  Problem Relation Age of Onset  . Heart disease Mother   . Atrial fibrillation Mother   . Diabetes Mother        Borderline  . Heart disease Father        Passed away of heart attack  . Kidney disease Neg Hx   . Prostate cancer Neg Hx     Social History   Tobacco Use  . Smoking status: Never Smoker  . Smokeless tobacco: Never Used  Vaping Use  . Vaping Use: Never used  Substance Use Topics  . Alcohol use: No    Alcohol/week: 0.0 standard drinks  . Drug use: No     Allergies  Allergen Reactions  . Macrobid [Nitrofurantoin]   . Penicillins     Sickness/sweating  . Sulfa  Antibiotics Itching    Health Maintenance  Topic Date Due  . COVID-19 Vaccine (1) 04/26/2020 (Originally 01/28/1968)  . Hepatitis C Screening  04/10/2021 (Originally 01-17-1956)  . TETANUS/TDAP  10/07/2023 (Originally 01/29/1975)  . COLONOSCOPY  12/28/2027  . INFLUENZA VACCINE  Completed  . HIV Screening  Completed    Chart Review Today: I personally reviewed active problem list, medication list, allergies, family history, social history, health maintenance, notes from last encounter, lab results, imaging with the patient/caregiver today.   Review of Systems  10 Systems reviewed and are negative for acute change except as noted in the HPI.  Objective:   Vitals:   04/10/20 1049  BP: 124/86  Pulse: 85  Resp: 18  Temp: 98.1 F (36.7 C)  TempSrc: Oral  SpO2: 99%    There is no height or weight on file to calculate BMI.  Physical Exam Vitals and nursing note reviewed.  Constitutional:      Appearance: He is well-developed. He is ill-appearing (chronically).  HENT:     Head: Normocephalic and atraumatic.     Nose: Nose normal.  Eyes:     General:        Right eye: No discharge.        Left eye: No discharge.     Conjunctiva/sclera: Conjunctivae normal.  Neck:     Trachea: No tracheal deviation.  Cardiovascular:     Rate and Rhythm: Normal rate and regular rhythm.  Pulmonary:     Effort: Pulmonary effort is normal. No respiratory distress.     Breath sounds: No stridor or decreased air movement.     Comments: Only able to evaluate anteriorly and upper posterior lung fields, diminished bs, no rales or wheeze Abdominal:     General: Bowel sounds are normal. There is no distension.     Palpations: Abdomen is soft.     Tenderness: There is no abdominal tenderness.  Skin:    Findings: Rash present.     Comments: Unable to maneuver pt in clinic in his  WC, skin not directly evaluated today in exam room, picture from this morning shows large area of rash, erythema and  excoriations to large area of sacrum  Neurological:     Mental Status: He is alert.     Motor: Atrophy and abnormal muscle tone present.     Coordination: Coordination abnormal.     Comments: Pt in wheelchair B/l LE atrophy and left UE atrophy and some contracture   Psychiatric:        Attention and Perception: Attention normal.        Mood and Affect: Mood is depressed.        Speech: Speech is delayed.        Behavior: Behavior is cooperative.           Assessment & Plan:     ICD-10-CM   1. Cerebral palsy, unspecified type ([place])  G80.9   2. Seasonal allergies  J30.2    Currently well controlled continue allergy medications as needed  3. Mood disorder ([place])  F39    On Abilify for mood disorder but he is feeling more down crying easily discussed trying an additional medication to help with depression  4. Pure hypercholesterolemia  O03.21 COMPLETE METABOLIC PANEL WITH GFR    Lipid panel   Patient has elevated cholesterol and atherosclerotic disease not on statin reviewed medications and recommendations  5. Gastroesophageal reflux disease, unspecified whether esophagitis present  K21.9    Stable, well-controlled symptoms on Nexium daily  6. Iron deficiency anemia, unspecified iron deficiency anemia type  D50.9 CBC with Differential/Platelet   Monitoring H/H  7. Pressure injury of sacral region, stage 1  L89.151    needs HH wound care   8. History of DVT of lower extremity  Z86.718 CBC with Differential/Platelet    COMPLETE METABOLIC PANEL WITH GFR   No change or worsening of lower extremity symptoms atrophied, extended out straight in wheelchair  9. Monitoring for long-term anticoagulant use  Z51.81 CBC with Differential/Platelet   Y24.82 COMPLETE METABOLIC PANEL WITH GFR   Monitoring labs no bleeding concerns today  10. Suprapubic catheter ([place])  Z93.59    monitored by urology and [facility] nursing  11. Encounter for medication monitoring  Z51.81 CBC with Differential/Platelet     COMPLETE METABOLIC PANEL WITH GFR  12. Rash and nonspecific skin eruption  R21    Possibly contact dermatitis to briefs/depends, excoriated, needs HH wound care assessment and tx   Pt daughter states they have been needing several orders for supplies at home -including a left and she also mentions wanting to get a new bed states that she has been needing a letter of medical necessity however have not seen any documents coming from the people that the patient daughter mentions today.  Gave the patient's family our office number and fax and encouraged her to give this to [place] and/or [place] and ask them to fax [place] the assessments in order so that they can be signed and added to any office visits or other required documentation.  Order for lift? Hospital bed? Letter of medical necessity?  Will complete once we receive records/eval/orders   Past virtual visit for orders done in Sept -  HPI: HPI BEVIN MAYALL is a 64 y/o male presents with request to get lift order to help with caring for the pt in his home.  He has history of cerebral palsy he is wheelchair-bound, he does have home health OT and home health nursing that helps specifically with neurogenic  bladder and management of indwelling suprapubic tube which she has had for several years.  His sister presents with him today, she is his primary caregiver, [name] on his DPR. Recent in office visit patient sister inquired about reordering home health to help them understand how to use his wheelchair.  His PCP had ordered the wheelchair just prior to her leaving the clinic in early 2020 at the start of Covid pandemic, since May of this year home health PT and did go out for an assessment but determined that his deteriorating condition did not require any PT and HH OT and nursing has been working with the patient and his sister for the past 4 months.  Pt's sister called in for assistance a few days ago stating that she spoke with pt's  insurance and was told to request a Rx for a lift kit. Sister says that Medicare is requesting a Rx and a letter stating pt's currently health condition and how he is declining she says that pt is unable to walk or use the left side of his body.   please fax toRobert Linens [phone] With Medicare  Pt is completely dependent on sister for all weight transfers, rolling and position changes to avoid pressure ulcers.  He can sit up for a few hours if she helps him, but he cannot sit up any longer due to skin breakdown.  He otherwise requires full assistance for feeding, bathing, clothing [place] OT and nursing has continued to work with the pt [name] with He is experiencing decline in mobility, increased weakness, contractures and mobility on left side of his body.  02/22/2020 A&P: 1. Cerebral palsy, unspecified type ([place])  G80.9 For home use only DME Hospital bed    Patient presented via virtual encounter with his sister and primary caregiver present, they present for paperwork and orders for a lift in a hospital bed  will coordinate requested orders with HHOT:  [name] ([facility]) [phone] she has assessed the patient for the past couple years and has been working with him recently.   Patient is unable to stand at all, no mobility currently, unable to reposition himself in bed, high fall risk due to deconditioning and progression of left-sided contractures and weakness, lifelong cerebral palsy,  CP has affected both legs for most of his life-cannot ambulate with assist, with progressive left upper extremity CP symptoms Progression: worsening, decline in function worsening generalized weakness Other diagnoses: Neurogenic bladder secondary to cerebral palsy with indwelling suprapubic tube Hx of falls, none in the past year  CMA reaching out to [place] to request more info for specific DME orders and recent eval/assessment   Cathrine Muster CMA had helped with getting DME orders  done and faxed with encounter visit documentation on 9/30 and 02/23/2020:  Past orders: Length of Need Lifetime  Patient has (list medical condition): Cerebral palsy, nonambulatory, left-sided contracture deformity  The above medical condition requires: Patient requires the ability to reposition frequently  Head must be elevated greater than: 45 degrees  Bed type Semi-electric  Hoyer Lift Yes  Support Surface: Gel Overlay   No change to last assessment and condition: Patient is unable to stand at all, no mobility currently, unable to reposition himself in bed, high fall risk due to deconditioning and progression of left-sided contractures and weakness, lifelong cerebral palsy,  CP has affected both legs for most of his life-cannot ambulate with assist, with progressive left upper extremity CP symptoms Progression: worsening, decline in function  worsening generalized weakness Other diagnoses: Neurogenic bladder secondary to cerebral palsy with indwelling suprapubic tube Hx of falls, none in the past year  Overhead patient lift system Seabron Spates Jenne Pane)  DX:G80.9,R53.1,Z91.81 LON:99 Sleep safe bed  DX:G80.9, R53.1,Z91.81 LON:99  Above and prior orders are medically necessary due to pt chronic and progression and declining condition.  Return in about 3 months (around 07/11/2020) for Routine follow-up.   Delsa Grana, PA-C 04/10/20 11:11 AM

## 2020-04-10 NOTE — Patient Instructions (Signed)
Please see all the records and orders previously done and records from this visit and last virtual visit:  Need 2 month follow up on mood/celexa  Info about statins:  Recommendations on cholesterol and starting statins.    There is a benefit from LDL-C (bad cholesterol) lowering with statin therapy at virtually all levels of cardiovascular risk.  If statin therapy had no side effects and caused no financial burden, it might be reasonable to recommend it to virtually all at-risk individuals, similar to a healthy diet and exercise  It is this good of a medication!!  It reduces risk in almost everyone.   There are possible side effects with ALL medications and with statins there is a small subset of the population who may not metabolize it well, which causing muscle aches as a side effect (~5%).  We monitor for this, can test for this, and usually are careful to work with you to get a medication that gives you the benefits with minimal side effects.  Some people are sensitive to medications in general and we try to use the highest dose tolerated to give the most benefit.   [place] [facility] cholesterol and statin guidelines are as follows: In adults 39 to 63 years of age without diabetes mellitus and with LDL-C levels ?24, at a 10-year atherosclerotic cardiovascular disease risk of ?7.5 percent, start a moderate-intensity statin if a discussion of treatment options favors statin therapy  If LDL is >160, statins are indicated.  Patients with other significant risk factors would also benefit from statin.  Some of these factors include a family history of premature cardiovascular disease, chronic kidney disease, or chronic inflammatory disorder (such as chronic human immunodeficiency viral infection).   Can get more information at the following website:  PromotionalLoans.co.za    Lab Results   Component Value Date   CHOL 191 10/05/2019   HDL 45 10/05/2019   LDLCALC 129 (H) 10/05/2019   TRIG 80 10/05/2019   CHOLHDL 4.2 10/05/2019   The 10-year ASCVD risk score Mikey Bussing DC Jr., et al., 2013) is: 10.7%   Values used to calculate the score:     Age: 64 years     Sex: Male     Is Non-Hispanic African American: No     Diabetic: No     Tobacco smoker: No     Systolic Blood Pressure: 735 mmHg     Is BP treated: No     HDL Cholesterol: 45 mg/dL     Total Cholesterol: 191 mg/dL  Statin is indicated with ASCVD risk > 7.5%   I would only do a statin for [place] if he tolerated the medication well, he was agreeable to taking another pill and understood it was for lowering cholesterol and intended to decrease his risk of having a heart attack or stroke in the next 10 years.  With his worsening chronic conditions starting a statin medication may not change his life expectancy and if he had any adverse side effects I would advise stopping medications.  We can review his labs and you can let me know what he would like to do

## 2020-04-11 LAB — LIPID PANEL
Cholesterol: 186 mg/dL (ref <200)
HDL: 40 mg/dL (ref >=40)
LDL Cholesterol (Calc): 126 mg/dL (calc) — ABNORMAL HIGH
Non-HDL Cholesterol (Calc): 146 mg/dL (calc) — ABNORMAL HIGH (ref <130)
Total CHOL/HDL Ratio: 4.7 (calc) (ref <5.0)
Triglycerides: 102 mg/dL (ref <150)

## 2020-04-11 LAB — COMPLETE METABOLIC PANEL WITH GFR
AG Ratio: 1.6 (calc) (ref 1.0–2.5)
ALT: 21 U/L (ref 9–46)
AST: 18 U/L (ref 10–35)
Albumin: 4.3 g/dL (ref 3.6–5.1)
Alkaline phosphatase (APISO): 87 U/L (ref 35–144)
BUN/Creatinine Ratio: 36 (calc) — ABNORMAL HIGH (ref 6–22)
BUN: 21 mg/dL (ref 7–25)
CO2: 23 mmol/L (ref 20–32)
Calcium: 9.1 mg/dL (ref 8.6–10.3)
Chloride: 107 mmol/L (ref 98–110)
Creat: 0.58 mg/dL — ABNORMAL LOW (ref 0.70–1.25)
GFR, Est African American: 126 mL/min/{1.73_m2} (ref >=60)
GFR, Est Non African American: 109 mL/min/{1.73_m2} (ref >=60)
Globulin: 2.7 g/dL (calc) (ref 1.9–3.7)
Glucose, Bld: 87 mg/dL (ref 65–99)
Potassium: 4.6 mmol/L (ref 3.5–5.3)
Sodium: 140 mmol/L (ref 135–146)
Total Bilirubin: 0.3 mg/dL (ref 0.2–1.2)
Total Protein: 7 g/dL (ref 6.1–8.1)

## 2020-04-11 LAB — CBC WITH DIFFERENTIAL/PLATELET
Absolute Monocytes: 426 cells/uL (ref 200–950)
Basophils Absolute: 53 cells/uL (ref 0–200)
Basophils Relative: 0.7 %
Eosinophils Absolute: 30 cells/uL (ref 15–500)
Eosinophils Relative: 0.4 %
HCT: 40.7 % (ref 38.5–50.0)
Hemoglobin: 13.4 g/dL (ref 13.2–17.1)
Lymphs Abs: 1056 cells/uL (ref 850–3900)
MCH: 27.8 pg (ref 27.0–33.0)
MCHC: 32.9 g/dL (ref 32.0–36.0)
MCV: 84.4 fL (ref 80.0–100.0)
MPV: 11.2 fL (ref 7.5–12.5)
Monocytes Relative: 5.6 %
Neutro Abs: 6034 cells/uL (ref 1500–7800)
Neutrophils Relative %: 79.4 %
Platelets: 195 10*3/uL (ref 140–400)
RBC: 4.82 10*6/uL (ref 4.20–5.80)
RDW: 13.5 % (ref 11.0–15.0)
Total Lymphocyte: 13.9 %
WBC: 7.6 10*3/uL (ref 3.8–10.8)

## 2020-04-22 ENCOUNTER — Telehealth: Payer: Self-pay

## 2020-04-22 NOTE — Telephone Encounter (Signed)
Copied from [place] [id number]. Topic: General - Other >> Apr 19, 2020 10:06 AM Celene Kras wrote: Reason for CRM: Pts sister called and is requesting to have an update on the fax for the pts lift and safe bed. She states that she faxed it over last week. Please advise.

## 2020-04-25 ENCOUNTER — Telehealth: Payer: Medicare Other | Admitting: Family Medicine

## 2020-05-03 DIAGNOSIS — N39498 Other specified urinary incontinence: Secondary | ICD-10-CM | POA: Diagnosis not present

## 2020-05-03 DIAGNOSIS — Z86718 Personal history of other venous thrombosis and embolism: Secondary | ICD-10-CM | POA: Diagnosis not present

## 2020-05-03 DIAGNOSIS — Z435 Encounter for attention to cystostomy: Secondary | ICD-10-CM | POA: Diagnosis not present

## 2020-05-03 DIAGNOSIS — Z7901 Long term (current) use of anticoagulants: Secondary | ICD-10-CM | POA: Diagnosis not present

## 2020-05-03 DIAGNOSIS — N319 Neuromuscular dysfunction of bladder, unspecified: Secondary | ICD-10-CM | POA: Diagnosis not present

## 2020-05-03 DIAGNOSIS — F329 Major depressive disorder, single episode, unspecified: Secondary | ICD-10-CM | POA: Diagnosis not present

## 2020-05-03 DIAGNOSIS — G809 Cerebral palsy, unspecified: Secondary | ICD-10-CM | POA: Diagnosis not present

## 2020-05-03 DIAGNOSIS — Z95828 Presence of other vascular implants and grafts: Secondary | ICD-10-CM | POA: Diagnosis not present

## 2020-05-03 DIAGNOSIS — Z466 Encounter for fitting and adjustment of urinary device: Secondary | ICD-10-CM | POA: Diagnosis not present

## 2020-05-13 DIAGNOSIS — Z466 Encounter for fitting and adjustment of urinary device: Secondary | ICD-10-CM | POA: Diagnosis not present

## 2020-05-13 DIAGNOSIS — Z435 Encounter for attention to cystostomy: Secondary | ICD-10-CM | POA: Diagnosis not present

## 2020-05-13 DIAGNOSIS — G809 Cerebral palsy, unspecified: Secondary | ICD-10-CM | POA: Diagnosis not present

## 2020-05-13 DIAGNOSIS — N39498 Other specified urinary incontinence: Secondary | ICD-10-CM | POA: Diagnosis not present

## 2020-05-13 DIAGNOSIS — N319 Neuromuscular dysfunction of bladder, unspecified: Secondary | ICD-10-CM | POA: Diagnosis not present

## 2020-05-13 DIAGNOSIS — F329 Major depressive disorder, single episode, unspecified: Secondary | ICD-10-CM | POA: Diagnosis not present

## 2020-05-13 NOTE — Telephone Encounter (Signed)
Never received fax

## 2020-05-18 DIAGNOSIS — Z435 Encounter for attention to cystostomy: Secondary | ICD-10-CM | POA: Diagnosis not present

## 2020-05-18 DIAGNOSIS — Z466 Encounter for fitting and adjustment of urinary device: Secondary | ICD-10-CM | POA: Diagnosis not present

## 2020-05-18 DIAGNOSIS — G809 Cerebral palsy, unspecified: Secondary | ICD-10-CM | POA: Diagnosis not present

## 2020-05-18 DIAGNOSIS — N39498 Other specified urinary incontinence: Secondary | ICD-10-CM | POA: Diagnosis not present

## 2020-05-18 DIAGNOSIS — F329 Major depressive disorder, single episode, unspecified: Secondary | ICD-10-CM | POA: Diagnosis not present

## 2020-05-18 DIAGNOSIS — N319 Neuromuscular dysfunction of bladder, unspecified: Secondary | ICD-10-CM | POA: Diagnosis not present

## 2020-05-21 DIAGNOSIS — F329 Major depressive disorder, single episode, unspecified: Secondary | ICD-10-CM | POA: Diagnosis not present

## 2020-05-21 DIAGNOSIS — Z466 Encounter for fitting and adjustment of urinary device: Secondary | ICD-10-CM | POA: Diagnosis not present

## 2020-05-21 DIAGNOSIS — G809 Cerebral palsy, unspecified: Secondary | ICD-10-CM | POA: Diagnosis not present

## 2020-05-21 DIAGNOSIS — N319 Neuromuscular dysfunction of bladder, unspecified: Secondary | ICD-10-CM | POA: Diagnosis not present

## 2020-05-21 DIAGNOSIS — Z435 Encounter for attention to cystostomy: Secondary | ICD-10-CM | POA: Diagnosis not present

## 2020-05-21 DIAGNOSIS — N39498 Other specified urinary incontinence: Secondary | ICD-10-CM | POA: Diagnosis not present

## 2020-05-28 ENCOUNTER — Other Ambulatory Visit: Payer: Self-pay | Admitting: Urology

## 2020-05-28 ENCOUNTER — Other Ambulatory Visit: Payer: Self-pay | Admitting: Family Medicine

## 2020-05-28 ENCOUNTER — Other Ambulatory Visit: Payer: Self-pay | Admitting: Dermatology

## 2020-05-29 DIAGNOSIS — F329 Major depressive disorder, single episode, unspecified: Secondary | ICD-10-CM | POA: Diagnosis not present

## 2020-05-29 DIAGNOSIS — Z435 Encounter for attention to cystostomy: Secondary | ICD-10-CM | POA: Diagnosis not present

## 2020-05-29 DIAGNOSIS — Z466 Encounter for fitting and adjustment of urinary device: Secondary | ICD-10-CM | POA: Diagnosis not present

## 2020-05-29 DIAGNOSIS — G809 Cerebral palsy, unspecified: Secondary | ICD-10-CM | POA: Diagnosis not present

## 2020-05-29 DIAGNOSIS — N319 Neuromuscular dysfunction of bladder, unspecified: Secondary | ICD-10-CM | POA: Diagnosis not present

## 2020-05-29 DIAGNOSIS — N39498 Other specified urinary incontinence: Secondary | ICD-10-CM | POA: Diagnosis not present

## 2020-06-02 DIAGNOSIS — N319 Neuromuscular dysfunction of bladder, unspecified: Secondary | ICD-10-CM | POA: Diagnosis not present

## 2020-06-02 DIAGNOSIS — Z86718 Personal history of other venous thrombosis and embolism: Secondary | ICD-10-CM | POA: Diagnosis not present

## 2020-06-02 DIAGNOSIS — Z435 Encounter for attention to cystostomy: Secondary | ICD-10-CM | POA: Diagnosis not present

## 2020-06-02 DIAGNOSIS — Z466 Encounter for fitting and adjustment of urinary device: Secondary | ICD-10-CM | POA: Diagnosis not present

## 2020-06-02 DIAGNOSIS — F329 Major depressive disorder, single episode, unspecified: Secondary | ICD-10-CM | POA: Diagnosis not present

## 2020-06-02 DIAGNOSIS — Z7901 Long term (current) use of anticoagulants: Secondary | ICD-10-CM | POA: Diagnosis not present

## 2020-06-02 DIAGNOSIS — N39498 Other specified urinary incontinence: Secondary | ICD-10-CM | POA: Diagnosis not present

## 2020-06-02 DIAGNOSIS — G809 Cerebral palsy, unspecified: Secondary | ICD-10-CM | POA: Diagnosis not present

## 2020-06-02 DIAGNOSIS — Z95828 Presence of other vascular implants and grafts: Secondary | ICD-10-CM | POA: Diagnosis not present

## 2020-06-05 DIAGNOSIS — Z435 Encounter for attention to cystostomy: Secondary | ICD-10-CM | POA: Diagnosis not present

## 2020-06-05 DIAGNOSIS — N39498 Other specified urinary incontinence: Secondary | ICD-10-CM | POA: Diagnosis not present

## 2020-06-05 DIAGNOSIS — Z466 Encounter for fitting and adjustment of urinary device: Secondary | ICD-10-CM | POA: Diagnosis not present

## 2020-06-05 DIAGNOSIS — G809 Cerebral palsy, unspecified: Secondary | ICD-10-CM | POA: Diagnosis not present

## 2020-06-05 DIAGNOSIS — N319 Neuromuscular dysfunction of bladder, unspecified: Secondary | ICD-10-CM | POA: Diagnosis not present

## 2020-06-05 DIAGNOSIS — F329 Major depressive disorder, single episode, unspecified: Secondary | ICD-10-CM | POA: Diagnosis not present

## 2020-06-21 ENCOUNTER — Other Ambulatory Visit: Payer: Self-pay | Admitting: Family Medicine

## 2020-06-21 DIAGNOSIS — N319 Neuromuscular dysfunction of bladder, unspecified: Secondary | ICD-10-CM | POA: Diagnosis not present

## 2020-06-21 DIAGNOSIS — Z86718 Personal history of other venous thrombosis and embolism: Secondary | ICD-10-CM

## 2020-06-21 DIAGNOSIS — N39498 Other specified urinary incontinence: Secondary | ICD-10-CM | POA: Diagnosis not present

## 2020-06-21 DIAGNOSIS — Z466 Encounter for fitting and adjustment of urinary device: Secondary | ICD-10-CM | POA: Diagnosis not present

## 2020-06-21 DIAGNOSIS — Z435 Encounter for attention to cystostomy: Secondary | ICD-10-CM | POA: Diagnosis not present

## 2020-06-21 DIAGNOSIS — F329 Major depressive disorder, single episode, unspecified: Secondary | ICD-10-CM | POA: Diagnosis not present

## 2020-06-21 DIAGNOSIS — G809 Cerebral palsy, unspecified: Secondary | ICD-10-CM | POA: Diagnosis not present

## 2020-06-25 DIAGNOSIS — Z466 Encounter for fitting and adjustment of urinary device: Secondary | ICD-10-CM | POA: Diagnosis not present

## 2020-06-25 DIAGNOSIS — N39498 Other specified urinary incontinence: Secondary | ICD-10-CM | POA: Diagnosis not present

## 2020-06-25 DIAGNOSIS — G809 Cerebral palsy, unspecified: Secondary | ICD-10-CM | POA: Diagnosis not present

## 2020-06-25 DIAGNOSIS — F329 Major depressive disorder, single episode, unspecified: Secondary | ICD-10-CM | POA: Diagnosis not present

## 2020-06-25 DIAGNOSIS — N319 Neuromuscular dysfunction of bladder, unspecified: Secondary | ICD-10-CM | POA: Diagnosis not present

## 2020-06-25 DIAGNOSIS — Z435 Encounter for attention to cystostomy: Secondary | ICD-10-CM | POA: Diagnosis not present

## 2020-07-02 ENCOUNTER — Telehealth: Payer: Self-pay

## 2020-07-02 DIAGNOSIS — Z95828 Presence of other vascular implants and grafts: Secondary | ICD-10-CM | POA: Diagnosis not present

## 2020-07-02 DIAGNOSIS — F329 Major depressive disorder, single episode, unspecified: Secondary | ICD-10-CM | POA: Diagnosis not present

## 2020-07-02 DIAGNOSIS — Z435 Encounter for attention to cystostomy: Secondary | ICD-10-CM | POA: Diagnosis not present

## 2020-07-02 DIAGNOSIS — N319 Neuromuscular dysfunction of bladder, unspecified: Secondary | ICD-10-CM | POA: Diagnosis not present

## 2020-07-02 DIAGNOSIS — Z7901 Long term (current) use of anticoagulants: Secondary | ICD-10-CM | POA: Diagnosis not present

## 2020-07-02 DIAGNOSIS — N39498 Other specified urinary incontinence: Secondary | ICD-10-CM | POA: Diagnosis not present

## 2020-07-02 DIAGNOSIS — Z466 Encounter for fitting and adjustment of urinary device: Secondary | ICD-10-CM | POA: Diagnosis not present

## 2020-07-02 DIAGNOSIS — Z86718 Personal history of other venous thrombosis and embolism: Secondary | ICD-10-CM | POA: Diagnosis not present

## 2020-07-02 DIAGNOSIS — G809 Cerebral palsy, unspecified: Secondary | ICD-10-CM | POA: Diagnosis not present

## 2020-07-02 NOTE — Telephone Encounter (Signed)
Copied from [place] [id number]. Topic: General - Other >> Jul 02, 2020  9:29 AM Leward Quan A wrote: Reason for CRM: Patients guardian and caretaker [name] called in to inform Kristeen Miss that she have not forgotten about his appointment and will call back to reschedule. But say that the whole house had Covid. Say when he gets over the sinus issues she will make an appointment to bring him in to be seen. Please advise Ph# [phone]

## 2020-07-02 NOTE — Telephone Encounter (Signed)
FYI

## 2020-07-15 DIAGNOSIS — G809 Cerebral palsy, unspecified: Secondary | ICD-10-CM | POA: Diagnosis not present

## 2020-07-15 DIAGNOSIS — Z435 Encounter for attention to cystostomy: Secondary | ICD-10-CM | POA: Diagnosis not present

## 2020-07-15 DIAGNOSIS — N39498 Other specified urinary incontinence: Secondary | ICD-10-CM | POA: Diagnosis not present

## 2020-07-15 DIAGNOSIS — F329 Major depressive disorder, single episode, unspecified: Secondary | ICD-10-CM | POA: Diagnosis not present

## 2020-07-15 DIAGNOSIS — N319 Neuromuscular dysfunction of bladder, unspecified: Secondary | ICD-10-CM | POA: Diagnosis not present

## 2020-07-15 DIAGNOSIS — Z466 Encounter for fitting and adjustment of urinary device: Secondary | ICD-10-CM | POA: Diagnosis not present

## 2020-07-29 ENCOUNTER — Other Ambulatory Visit: Payer: Self-pay | Admitting: Family Medicine

## 2020-07-29 ENCOUNTER — Telehealth: Payer: Self-pay

## 2020-07-29 DIAGNOSIS — Z435 Encounter for attention to cystostomy: Secondary | ICD-10-CM | POA: Diagnosis not present

## 2020-07-29 DIAGNOSIS — N319 Neuromuscular dysfunction of bladder, unspecified: Secondary | ICD-10-CM | POA: Diagnosis not present

## 2020-07-29 DIAGNOSIS — N39498 Other specified urinary incontinence: Secondary | ICD-10-CM | POA: Diagnosis not present

## 2020-07-29 DIAGNOSIS — Z466 Encounter for fitting and adjustment of urinary device: Secondary | ICD-10-CM | POA: Diagnosis not present

## 2020-07-29 DIAGNOSIS — F329 Major depressive disorder, single episode, unspecified: Secondary | ICD-10-CM | POA: Diagnosis not present

## 2020-07-29 DIAGNOSIS — G809 Cerebral palsy, unspecified: Secondary | ICD-10-CM | POA: Diagnosis not present

## 2020-07-29 MED ORDER — PANTOPRAZOLE SODIUM 40 MG PO TBEC: 40.0000 mg | DELAYED_RELEASE_TABLET | Freq: Every day | ORAL | 1 refills | Status: DC

## 2020-07-29 NOTE — Telephone Encounter (Signed)
Insurance denied his nexium?

## 2020-08-01 DIAGNOSIS — Z435 Encounter for attention to cystostomy: Secondary | ICD-10-CM | POA: Diagnosis not present

## 2020-08-01 DIAGNOSIS — N319 Neuromuscular dysfunction of bladder, unspecified: Secondary | ICD-10-CM | POA: Diagnosis not present

## 2020-08-01 DIAGNOSIS — Z466 Encounter for fitting and adjustment of urinary device: Secondary | ICD-10-CM | POA: Diagnosis not present

## 2020-08-01 DIAGNOSIS — N39498 Other specified urinary incontinence: Secondary | ICD-10-CM | POA: Diagnosis not present

## 2020-08-01 DIAGNOSIS — Z95828 Presence of other vascular implants and grafts: Secondary | ICD-10-CM | POA: Diagnosis not present

## 2020-08-01 DIAGNOSIS — Z86718 Personal history of other venous thrombosis and embolism: Secondary | ICD-10-CM | POA: Diagnosis not present

## 2020-08-01 DIAGNOSIS — Z7901 Long term (current) use of anticoagulants: Secondary | ICD-10-CM | POA: Diagnosis not present

## 2020-08-01 DIAGNOSIS — F329 Major depressive disorder, single episode, unspecified: Secondary | ICD-10-CM | POA: Diagnosis not present

## 2020-08-01 DIAGNOSIS — G809 Cerebral palsy, unspecified: Secondary | ICD-10-CM | POA: Diagnosis not present

## 2020-08-08 DIAGNOSIS — N319 Neuromuscular dysfunction of bladder, unspecified: Secondary | ICD-10-CM | POA: Diagnosis not present

## 2020-08-08 DIAGNOSIS — Z466 Encounter for fitting and adjustment of urinary device: Secondary | ICD-10-CM | POA: Diagnosis not present

## 2020-08-08 DIAGNOSIS — N39498 Other specified urinary incontinence: Secondary | ICD-10-CM | POA: Diagnosis not present

## 2020-08-08 DIAGNOSIS — G809 Cerebral palsy, unspecified: Secondary | ICD-10-CM | POA: Diagnosis not present

## 2020-08-08 DIAGNOSIS — F329 Major depressive disorder, single episode, unspecified: Secondary | ICD-10-CM | POA: Diagnosis not present

## 2020-08-08 DIAGNOSIS — Z435 Encounter for attention to cystostomy: Secondary | ICD-10-CM | POA: Diagnosis not present

## 2020-08-12 ENCOUNTER — Other Ambulatory Visit: Payer: Self-pay

## 2020-08-12 MED ORDER — NYSTATIN 100000 UNIT/GM EX POWD: CUTANEOUS | 1 refills | Status: DC

## 2020-08-15 ENCOUNTER — Other Ambulatory Visit: Payer: Self-pay | Admitting: Family Medicine

## 2020-08-15 DIAGNOSIS — F321 Major depressive disorder, single episode, moderate: Secondary | ICD-10-CM

## 2020-08-15 NOTE — Telephone Encounter (Signed)
Looks like [name] wanted follow up in 2 months for mood. Courtesy 30 day refill provided. Scheduled on 08/22/2020.

## 2020-08-22 ENCOUNTER — Telehealth (INDEPENDENT_AMBULATORY_CARE_PROVIDER_SITE_OTHER): Payer: Medicare Other | Admitting: Family Medicine

## 2020-08-22 ENCOUNTER — Encounter: Payer: Self-pay | Admitting: Family Medicine

## 2020-08-22 DIAGNOSIS — E7800 Pure hypercholesterolemia, unspecified: Secondary | ICD-10-CM | POA: Diagnosis not present

## 2020-08-22 MED ORDER — ROSUVASTATIN CALCIUM 5 MG PO TABS: 5.0000 mg | ORAL_TABLET | Freq: Every day | ORAL | 3 refills | Status: DC

## 2020-08-22 NOTE — Progress Notes (Signed)
Name: DECARI DUGGAR   MRN: [id number]    DOB: 06-20-1955   Date:08/22/2020       Progress Note  Subjective:    Chief Complaint  Chief Complaint  Patient presents with  . Follow-up  . Medication Refill  . Hyperlipidemia    Willing to start medication    I connected with  [name] on 08/22/20 at  1:20 PM EDT by telephone and verified that I am speaking with the correct person using two identifiers.   I discussed the limitations, risks, security and privacy concerns of performing an evaluation and management service by telephone and the availability of in person appointments. Staff also discussed with the patient that there may be a patient responsible charge related to this service.  Patient verbalized understanding and agreed to proceed with encounter. Patient Location: home Provider Location: [facility] Additional Individuals present: [name]  HPI  [name] calls today to ask that pt and family is in agreement about starting and trying a low dose cholesterol med. Lab Results  Component Value Date   CHOL 186 04/10/2020   HDL 40 04/10/2020   LDLCALC 126 (H) 04/10/2020   TRIG 102 04/10/2020   CHOLHDL 4.7 04/10/2020  The 10-year ASCVD risk score Mikey Bussing DC Jr., et al., 2013) is: 11.3%   Values used to calculate the score:     Age: 65 years     Sex: Male     Is Non-Hispanic African American: No     Diabetic: No     Tobacco smoker: No     Systolic Blood Pressure: 650 mmHg     Is BP treated: No     HDL Cholesterol: 40 mg/dL     Total Cholesterol: 186 mg/dL       Patient Active Problem List   Diagnosis Date Noted  . Current moderate episode of major depressive disorder ([place]) 04/10/2020  . Suprapubic catheter ([place]) 04/10/2020  . Hyperlipidemia, unspecified 03/23/2019  . Iron deficiency anemia 08/24/2017  . Hypochromic microcytic anemia 08/03/2017  . GERD (gastroesophageal reflux disease) 08/03/2017  . Stage 1 decubitus ulcer 08/03/2017  . Abnormal urine odor  07/16/2016  . Need for home health care 06/17/2016  . Hyperglycemia 03/04/2016  . Screening for lipid disorders 03/04/2016  . Cerumen impaction 02/12/2016  . Cerebral palsy ([place]) 12/05/2015  . History of DVT of lower extremity 04/11/2015  . Mood disorder ([place]) 04/11/2015  . Neurogenic bladder 04/11/2015  . Monitoring for long-term anticoagulant use 04/11/2015    Social History   Tobacco Use  . Smoking status: Never Smoker  . Smokeless tobacco: Never Used  Substance Use Topics  . Alcohol use: No    Alcohol/week: 0.0 standard drinks     Current Outpatient Medications:  .  acetaminophen (TYLENOL) 325 MG tablet, Take 1 tablet (325 mg total) by mouth every 6 (six) hours as needed for mild pain (or Fever >/= 101)., Disp: , Rfl:  .  ARIPiprazole (ABILIFY) 15 MG tablet, TAKE 1 TABLET BY MOUTH ONCE DAILY, Disp: 90 tablet, Rfl: 3 .  citalopram (CELEXA) 20 MG tablet, TAKE 1/2 TABLET BY MOUTH ONCE DAILY FOR 7 DAYS THEN 1 TABLET  ONCE DAILY, Disp: 30 tablet, Rfl: 0 .  cromolyn (OPTICROM) 4 % ophthalmic solution, Place 1 drop into both eyes 4 (four) times daily as needed (for eye itching burning and drainage/allergies)., Disp: 10 mL, Rfl: 0 .  fluticasone (FLONASE) 50 MCG/ACT nasal spray, Place into both nostrils daily., Disp: , Rfl:  .  loratadine (CLARITIN) 10 MG tablet, Take 10 mg by mouth daily., Disp: , Rfl:  .  nystatin (NYSTATIN) powder, APPLY TOPICALLY 3 TIMES DAILY AS NEEDED to affected skin, follow up in office if not improving, Disp: 60 g, Rfl: 1 .  nystatin cream (MYCOSTATIN), APPLY TOPICALLY 3 TIMES DAILY TO AFFECTED AREA, Disp: 30 g, Rfl: 2 .  oxybutynin (DITROPAN) 5 MG tablet, TAKE 1/2 TABLET BY MOUTH TWICE DAILY AS NEEDED BLADDER SPASM, Disp: 60 tablet, Rfl: 0 .  pantoprazole (PROTONIX) 40 MG tablet, Take 1 tablet (40 mg total) by mouth daily., Disp: 90 tablet, Rfl: 1 .  Red Yeast Rice 600 MG CAPS, Take by mouth., Disp: , Rfl:  .  rivaroxaban (XARELTO) 10 MG TABS tablet, Take 1  tablet (10 mg total) by mouth daily., Disp: 90 tablet, Rfl: 1 .  metroNIDAZOLE (METROGEL) 1 % gel, APPLY A SMALL AMOUNT TO AFFECTED AREA TWICE DAILY (Patient not taking: Reported on 08/22/2020), Disp: 60 g, Rfl: 0  Current Facility-Administered Medications:  .  lidocaine (XYLOCAINE) 2 % jelly 1 application, 1 application, Urethral, Once, Stoioff, Scott C, MD  Allergies  Allergen Reactions  . Macrobid [Nitrofurantoin]   . Penicillins     Sickness/sweating  . Sulfa Antibiotics Itching    Chart Review: I personally reviewed active problem list, medication list, allergies, family history, social history, health maintenance, notes from last encounter, lab results, imaging with the patient/caregiver today.   Review of Systems  Constitutional: Negative.   HENT: Negative.   Eyes: Negative.   Respiratory: Negative.   Cardiovascular: Negative.   Gastrointestinal: Negative.   Endocrine: Negative.   Genitourinary: Negative.   Musculoskeletal: Negative.   Skin: Negative.   Allergic/Immunologic: Negative.   Neurological: Negative.   Hematological: Negative.   Psychiatric/Behavioral: Negative.   All other systems reviewed and are negative.    Objective:    Virtual encounter, vitals limited, only able to obtain the following There were no vitals filed for this visit. There is no height or weight on file to calculate BMI. Nursing Note and Vital Signs reviewed.  Physical Exam Vitals and nursing note reviewed.  Neurological:     Mental Status: Mental status is at baseline.  Psychiatric:        Mood and Affect: Mood normal.        Behavior: Behavior normal.     PE limited by telephone encounter  No results found for this or any previous visit (from the past 72 hour(s)).  Assessment and Plan:     ICD-10-CM   1. Pure hypercholesterolemia  E78.00 rosuvastatin (CRESTOR) 5 MG tablet  start with 1/2 dose for 1-2 months and closely monitor for any SE - can increase to 5 mg if doing  well 4 month f/up to recheck labs and tolerance  Reviewed plan with [name] - 4 month f/up in person for exam, labs etc  I provided 15+ minutes of non-face-to-face time during this encounter.  Delsa Grana, PA-C 08/22/20 11:49 AM

## 2020-08-24 ENCOUNTER — Other Ambulatory Visit: Payer: Self-pay | Admitting: Urology

## 2020-08-26 ENCOUNTER — Other Ambulatory Visit: Payer: Self-pay

## 2020-08-26 DIAGNOSIS — F39 Unspecified mood [affective] disorder: Secondary | ICD-10-CM

## 2020-08-26 NOTE — Telephone Encounter (Signed)
Spoke with sister and scheduled appt

## 2020-08-27 DIAGNOSIS — N319 Neuromuscular dysfunction of bladder, unspecified: Secondary | ICD-10-CM | POA: Diagnosis not present

## 2020-08-27 DIAGNOSIS — Z466 Encounter for fitting and adjustment of urinary device: Secondary | ICD-10-CM | POA: Diagnosis not present

## 2020-08-27 DIAGNOSIS — G809 Cerebral palsy, unspecified: Secondary | ICD-10-CM | POA: Diagnosis not present

## 2020-08-27 DIAGNOSIS — Z435 Encounter for attention to cystostomy: Secondary | ICD-10-CM | POA: Diagnosis not present

## 2020-08-27 DIAGNOSIS — F329 Major depressive disorder, single episode, unspecified: Secondary | ICD-10-CM | POA: Diagnosis not present

## 2020-08-27 DIAGNOSIS — N39498 Other specified urinary incontinence: Secondary | ICD-10-CM | POA: Diagnosis not present

## 2020-08-30 MED ORDER — ARIPIPRAZOLE 15 MG PO TABS: 15.0000 mg | ORAL_TABLET | Freq: Every day | ORAL | 1 refills | Status: DC

## 2020-08-30 MED ORDER — NYSTATIN 100000 UNIT/GM EX CREA: TOPICAL_CREAM | CUTANEOUS | 1 refills | Status: DC

## 2020-08-31 DIAGNOSIS — G809 Cerebral palsy, unspecified: Secondary | ICD-10-CM | POA: Diagnosis not present

## 2020-08-31 DIAGNOSIS — N39498 Other specified urinary incontinence: Secondary | ICD-10-CM | POA: Diagnosis not present

## 2020-08-31 DIAGNOSIS — F329 Major depressive disorder, single episode, unspecified: Secondary | ICD-10-CM | POA: Diagnosis not present

## 2020-08-31 DIAGNOSIS — Z435 Encounter for attention to cystostomy: Secondary | ICD-10-CM | POA: Diagnosis not present

## 2020-08-31 DIAGNOSIS — Z95828 Presence of other vascular implants and grafts: Secondary | ICD-10-CM | POA: Diagnosis not present

## 2020-08-31 DIAGNOSIS — N319 Neuromuscular dysfunction of bladder, unspecified: Secondary | ICD-10-CM | POA: Diagnosis not present

## 2020-08-31 DIAGNOSIS — Z7901 Long term (current) use of anticoagulants: Secondary | ICD-10-CM | POA: Diagnosis not present

## 2020-08-31 DIAGNOSIS — Z466 Encounter for fitting and adjustment of urinary device: Secondary | ICD-10-CM | POA: Diagnosis not present

## 2020-08-31 DIAGNOSIS — Z86718 Personal history of other venous thrombosis and embolism: Secondary | ICD-10-CM | POA: Diagnosis not present

## 2020-09-03 NOTE — Progress Notes (Signed)
09/04/2020 11:26 AM   Gerald Montgomery November 01, 1955 [id number]  Referring provider: Delsa Grana, PA-C [address] [place],   [id number]  Chief Complaint  Patient presents with  . Follow-up   Urological history 1. Neurogenic bladder -managed with a SPT  2. High risk hematuria -non-smoker -CTU 2017 bilateral renal cysts and bladder diverticuli -cysto 2017 NED -cysto 2020 NED   HPI: Gerald Montgomery is a 65 y.o. who presents today for a medication refill request with his sister, Gerald Montgomery.    He has no urinary complaints at today's visit.  He did have an episode of gross hematuria associated with right flank pain last year.    She has reduced the vinegar irrigation from 2 ounces to 1 ounces daily and he has not had any further hematuria.     UA was negative for micro heme and urine culture was negative in 02/2020.    Home health is changing the SPT every thirty days.     Allergies  Allergen Reactions  . Macrobid [Nitrofurantoin]   . Penicillins     Sickness/sweating  . Sulfa Antibiotics Itching   Outpatient Encounter Medications as of 09/04/2020  Medication Sig  . acetaminophen (TYLENOL) 325 MG tablet Take 1 tablet (325 mg total) by mouth every 6 (six) hours as needed for mild pain (or Fever >/= 101).  . ARIPiprazole (ABILIFY) 15 MG tablet Take 1 tablet (15 mg total) by mouth daily.  . citalopram (CELEXA) 20 MG tablet TAKE 1/2 TABLET BY MOUTH ONCE DAILY FOR 7 DAYS THEN 1 TABLET  ONCE DAILY  . cromolyn (OPTICROM) 4 % ophthalmic solution Place 1 drop into both eyes 4 (four) times daily as needed (for eye itching burning and drainage/allergies).  . fluticasone (FLONASE) 50 MCG/ACT nasal spray Place into both nostrils daily.  Marland Kitchen loratadine (CLARITIN) 10 MG tablet Take 10 mg by mouth daily.  . metroNIDAZOLE (METROGEL) 1 % gel APPLY A SMALL AMOUNT TO AFFECTED AREA TWICE DAILY  . nystatin (NYSTATIN) powder APPLY TOPICALLY 3 TIMES DAILY AS NEEDED to affected skin,  follow up in office if not improving  . nystatin cream (MYCOSTATIN) APPLY TOPICALLY 3 TIMES DAILY TO AFFECTED AREA  . oxybutynin (DITROPAN) 5 MG tablet TAKE 1/2 TABLET BY MOUTH TWICE DAILY AS NEEDED BLADDER SPASM  . pantoprazole (PROTONIX) 40 MG tablet Take 1 tablet (40 mg total) by mouth daily.  . rivaroxaban (XARELTO) 10 MG TABS tablet Take 1 tablet (10 mg total) by mouth daily.  . rosuvastatin (CRESTOR) 5 MG tablet Take 1 tablet (5 mg total) by mouth daily.   Facility-Administered Encounter Medications as of 09/04/2020  Medication  . lidocaine (XYLOCAINE) 2 % jelly 1 application    Active Ambulatory Problems    Diagnosis Date Noted  . History of DVT of lower extremity 04/11/2015  . Mood disorder ([place]) 04/11/2015  . Neurogenic bladder 04/11/2015  . Monitoring for long-term anticoagulant use 04/11/2015  . Cerebral palsy ([place]) 12/05/2015  . Cerumen impaction 02/12/2016  . Hyperglycemia 03/04/2016  . Screening for lipid disorders 03/04/2016  . Need for home health care 06/17/2016  . Abnormal urine odor 07/16/2016  . Hypochromic microcytic anemia 08/03/2017  . GERD (gastroesophageal reflux disease) 08/03/2017  . Stage 1 decubitus ulcer 08/03/2017  . Iron deficiency anemia 08/24/2017  . Hyperlipidemia, unspecified 03/23/2019  . Current moderate episode of major depressive disorder ([place]) 04/10/2020  . Suprapubic catheter ([place]) 04/10/2020   Resolved Ambulatory Problems    Diagnosis Date Noted  .  Urinary tract infection 08/16/2015  . Hematuria, gross 10/07/2015  . Pneumonia 02/05/2016  . LLL pneumonia 02/12/2016  . Stage I pressure ulcer of right upper back 08/27/2016  . Sepsis ([place]) 02/05/2017  . Pressure injury of skin 02/06/2017   Past Medical History:  Diagnosis Date  . Depression   . Urinary incontinence    Family History  Problem Relation Age of Onset  . Heart disease Mother   . Atrial fibrillation Mother   . Diabetes Mother        Borderline  . Heart disease  Father        Passed away of heart attack  . Kidney disease Neg Hx   . Prostate cancer Neg Hx    Social History   Socioeconomic History  . Marital status: Single    Spouse name: Not on file  . Number of children: Not on file  . Years of education: Not on file  . Highest education level: Not on file  Occupational History  . Occupation: disabled  Tobacco Use  . Smoking status: Never Smoker  . Smokeless tobacco: Never Used  Vaping Use  . Vaping Use: Never used  Substance and Sexual Activity  . Alcohol use: No    Alcohol/week: 0.0 standard drinks  . Drug use: No  . Sexual activity: Never  Other Topics Concern  . Not on file  Social History Narrative  . Not on file   Social Determinants of Health   Financial Resource Strain: Low Risk   . Difficulty of Paying Living Expenses: Not hard at all  Food Insecurity: No Food Insecurity  . Worried About Charity fundraiser in the Last Year: Never true  . Ran Out of Food in the Last Year: Never true  Transportation Needs: No Transportation Needs  . Lack of Transportation (Medical): No  . Lack of Transportation (Non-Medical): No  Physical Activity: Inactive  . Days of Exercise per Week: 0 days  . Minutes of Exercise per Session: 0 min  Stress: No Stress Concern Present  . Feeling of Stress : Not at all  Social Connections: Socially Isolated  . Frequency of Communication with Friends and Family: More than three times a week  . Frequency of Social Gatherings with Friends and Family: More than three times a week  . Attends Religious Services: Never  . Active Member of Clubs or Organizations: No  . Attends Archivist Meetings: Never  . Marital Status: Never married  Intimate Partner Violence: Not At Risk  . Fear of Current or Ex-Partner: No  . Emotionally Abused: No  . Physically Abused: No  . Sexually Abused: No    Physical Exam: BP 137/87   Pulse 77   Constitutional:  Well nourished. Alert and oriented, No acute  distress. HEENT: [place] AT, mask in place.  Trachea midline Cardiovascular: No clubbing, cyanosis, or edema. Respiratory: Normal respiratory effort, no increased work of breathing. GU: No CVA tenderness.  No bladder fullness or masses.  SPT site is clean and dry.  An overnight bag is in place with light clear yellow urine.   Neurologic: Grossly intact, no focal deficits, moving all 4 extremities. Psychiatric: Normal mood and affect.  Laboratory Data: Component     Latest Ref Rng & Units 04/10/2020  Glucose     65 - 99 mg/dL 87  BUN     7 - 25 mg/dL 21  Creatinine     0.70 - 1.25 mg/dL 0.58 (L)  GFR, Est Non  African American     > OR = 60 mL/min/1.34m2 109  GFR, Est African American     > OR = 60 mL/min/1.28m2 126  BUN/Creatinine Ratio     6 - 22 (calc) 36 (H)  Sodium     135 - 146 mmol/L 140  Potassium     3.5 - 5.3 mmol/L 4.6  Chloride     98 - 110 mmol/L 107  CO2     20 - 32 mmol/L 23  Calcium     8.6 - 10.3 mg/dL 9.1  Total Protein     6.1 - 8.1 g/dL 7.0  Albumin MSPROF     3.6 - 5.1 g/dL 4.3  Globulin     1.9 - 3.7 g/dL (calc) 2.7  AG Ratio     1.0 - 2.5 (calc) 1.6  Total Bilirubin     0.2 - 1.2 mg/dL 0.3  Alkaline phosphatase (APISO)     35 - 144 U/L 87  AST     10 - 35 U/L 18  ALT     9 - 46 U/L 21   Component     Latest Ref Rng & Units 04/10/2020          WBC     3.8 - 10.8 Thousand/uL 7.6  RBC     4.20 - 5.80 Million/uL 4.82  Hemoglobin     13.2 - 17.1 g/dL 13.4  HCT     38.5 - 50.0 % 40.7  MCV     80.0 - 100.0 fL 84.4  MCH     27.0 - 33.0 pg 27.8  MCHC     32.0 - 36.0 g/dL 32.9  RDW     11.0 - 15.0 % 13.5  Platelets     140 - 400 Thousand/uL 195  Neutrophils     % 79.4  Lymphs     %   Monocytes     %   Eos     %   Basos     %   NEUT#     1,500 - 7,800 cells/uL 6,034  Lymphocyte #     850 - 3,900 cells/uL 1,056  Monocytes Absolute     0.1 - 0.9 x10E3/uL   EOS (ABSOLUTE)     0.0 - 0.4 x10E3/uL   Basophils Absolute     0 -  200 cells/uL 53  Immature Granulocytes     %   Immature Grans (Abs)     0.0 - 0.1 x10E3/uL   MPV     7.5 - 12.5 fL 11.2  WBC mixed population     200 - 950 cells/uL   Eosinophils Absolute     15 - 500 cells/uL 30  Total Lymphocyte     % 13.9  Monocytes Relative     % 5.6  Eosinophil     % 0.4  Basophil     % 0.7  Absolute Monocytes     200 - 950 cells/uL 426  Lymphocytes     %   Monocyte #     0.2 - 1.0 K/uL   I have reviewed the labs.  Pertinent Imaging: No recent imaging   Assessment & Plan:   1. Urinary retention -SPT exchanges managed through home health -Gerald Montgomery will continue to irrigate the [place] daily with vinegar  2. Gross hematuria -Episode last October which is since resolved -We discussed the recommended work-up for gross hematuria with CT urogram and cystoscopy, but they deferred at  this time stating if it should occur again they would pursue hematuria work-up at that time due to patient's comorbidities  Zara Council, PA-C  [place] [address], [place], [place] [address] [phone]

## 2020-09-04 ENCOUNTER — Ambulatory Visit (INDEPENDENT_AMBULATORY_CARE_PROVIDER_SITE_OTHER): Payer: Medicare Other | Admitting: Urology

## 2020-09-04 ENCOUNTER — Other Ambulatory Visit: Payer: Self-pay

## 2020-09-04 ENCOUNTER — Ambulatory Visit: Payer: Medicare Other | Admitting: Urology

## 2020-09-04 ENCOUNTER — Encounter: Payer: Self-pay | Admitting: Urology

## 2020-09-04 VITALS — BP 137/87 | HR 77

## 2020-09-04 DIAGNOSIS — R310 Gross hematuria: Secondary | ICD-10-CM

## 2020-09-04 DIAGNOSIS — N319 Neuromuscular dysfunction of bladder, unspecified: Secondary | ICD-10-CM | POA: Diagnosis not present

## 2020-09-16 ENCOUNTER — Other Ambulatory Visit: Payer: Self-pay | Admitting: Family Medicine

## 2020-09-16 DIAGNOSIS — Z435 Encounter for attention to cystostomy: Secondary | ICD-10-CM | POA: Diagnosis not present

## 2020-09-16 DIAGNOSIS — G809 Cerebral palsy, unspecified: Secondary | ICD-10-CM | POA: Diagnosis not present

## 2020-09-16 DIAGNOSIS — N319 Neuromuscular dysfunction of bladder, unspecified: Secondary | ICD-10-CM | POA: Diagnosis not present

## 2020-09-16 DIAGNOSIS — F329 Major depressive disorder, single episode, unspecified: Secondary | ICD-10-CM | POA: Diagnosis not present

## 2020-09-16 DIAGNOSIS — Z466 Encounter for fitting and adjustment of urinary device: Secondary | ICD-10-CM | POA: Diagnosis not present

## 2020-09-16 DIAGNOSIS — N39498 Other specified urinary incontinence: Secondary | ICD-10-CM | POA: Diagnosis not present

## 2020-09-25 ENCOUNTER — Other Ambulatory Visit: Payer: Self-pay

## 2020-09-25 DIAGNOSIS — F321 Major depressive disorder, single episode, moderate: Secondary | ICD-10-CM

## 2020-09-26 ENCOUNTER — Telehealth: Payer: Self-pay | Admitting: Family Medicine

## 2020-09-26 ENCOUNTER — Other Ambulatory Visit: Payer: Self-pay | Admitting: Family Medicine

## 2020-09-26 DIAGNOSIS — F329 Major depressive disorder, single episode, unspecified: Secondary | ICD-10-CM | POA: Diagnosis not present

## 2020-09-26 DIAGNOSIS — F321 Major depressive disorder, single episode, moderate: Secondary | ICD-10-CM

## 2020-09-26 DIAGNOSIS — G809 Cerebral palsy, unspecified: Secondary | ICD-10-CM | POA: Diagnosis not present

## 2020-09-26 DIAGNOSIS — Z466 Encounter for fitting and adjustment of urinary device: Secondary | ICD-10-CM | POA: Diagnosis not present

## 2020-09-26 DIAGNOSIS — Z435 Encounter for attention to cystostomy: Secondary | ICD-10-CM | POA: Diagnosis not present

## 2020-09-26 DIAGNOSIS — N39498 Other specified urinary incontinence: Secondary | ICD-10-CM | POA: Diagnosis not present

## 2020-09-26 DIAGNOSIS — N319 Neuromuscular dysfunction of bladder, unspecified: Secondary | ICD-10-CM | POA: Diagnosis not present

## 2020-09-26 NOTE — Telephone Encounter (Signed)
Copied from [place] [id number]. Topic: Quick Communication - Rx Refill/Question >> Sep 26, 2020 10:47 AM Leward Quan A wrote: Medication: citalopram (CELEXA) 20 MG tablet Asking for extra refills please   Has the patient contacted their pharmacy? Yes.   (Agent: If no, request that the patient contact the pharmacy for the refill.) (Agent: If yes, when and what did the pharmacy advise?)  Preferred Pharmacy (with phone number or street name): [facility] - [place], [place].  Phone:  [phone] Fax:  [phone]     Agent: Please be advised that RX refills may take up to 3 business days. We ask that you follow-up with your pharmacy.

## 2020-09-26 NOTE — Telephone Encounter (Signed)
Copied from [place] [id number]. Topic: Quick Communication - Home Health Verbal Orders >> Sep 26, 2020 10:44 AM Jodie Echevaria wrote: Caller/Agency: [facility] Number: [phone] ok to LM  Requesting OT/PT/Skilled Nursing/Social Work/Speech Therapy: Skilled nursing  continuation of care catheter change  Frequency: once every 3 weeks

## 2020-09-26 NOTE — Telephone Encounter (Signed)
Pt's sister and guardian called to report that he does not have any current supply of this medication. She needs to pick this up  Best contact: [place]

## 2020-09-26 NOTE — Telephone Encounter (Signed)
Last seen virtually: 3.31.2022 Next scheduled: no appt scheduled

## 2020-09-26 NOTE — Telephone Encounter (Signed)
This request has been addressed by other. Will refuse at this time-duplicate.

## 2020-09-26 NOTE — Addendum Note (Signed)
Addended by: Docia Furl on: 09/26/2020 03:42 PM   Modules accepted: Orders

## 2020-09-27 MED ORDER — CITALOPRAM HYDROBROMIDE 20 MG PO TABS: 20.0000 mg | ORAL_TABLET | Freq: Every day | ORAL | 0 refills | Status: DC

## 2020-09-27 NOTE — Addendum Note (Signed)
Addended by: Steele Sizer F on: 09/27/2020 01:10 PM   Modules accepted: Orders

## 2020-09-30 ENCOUNTER — Telehealth: Payer: Self-pay | Admitting: Urology

## 2020-09-30 DIAGNOSIS — N39498 Other specified urinary incontinence: Secondary | ICD-10-CM | POA: Diagnosis not present

## 2020-09-30 DIAGNOSIS — Z466 Encounter for fitting and adjustment of urinary device: Secondary | ICD-10-CM | POA: Diagnosis not present

## 2020-09-30 DIAGNOSIS — F329 Major depressive disorder, single episode, unspecified: Secondary | ICD-10-CM | POA: Diagnosis not present

## 2020-09-30 DIAGNOSIS — Z86718 Personal history of other venous thrombosis and embolism: Secondary | ICD-10-CM | POA: Diagnosis not present

## 2020-09-30 DIAGNOSIS — G809 Cerebral palsy, unspecified: Secondary | ICD-10-CM | POA: Diagnosis not present

## 2020-09-30 DIAGNOSIS — Z95828 Presence of other vascular implants and grafts: Secondary | ICD-10-CM | POA: Diagnosis not present

## 2020-09-30 DIAGNOSIS — Z435 Encounter for attention to cystostomy: Secondary | ICD-10-CM | POA: Diagnosis not present

## 2020-09-30 DIAGNOSIS — N319 Neuromuscular dysfunction of bladder, unspecified: Secondary | ICD-10-CM | POA: Diagnosis not present

## 2020-09-30 DIAGNOSIS — Z7901 Long term (current) use of anticoagulants: Secondary | ICD-10-CM | POA: Diagnosis not present

## 2020-09-30 NOTE — Telephone Encounter (Signed)
Home health nurse called about irrigation of catheter with apple cider vinager.  Told nurse never heard of it and doubted Gerald Montgomery did.  Look like he has seen [name] in past and that's who orders needed to come from if not, do as there protocal with just plain saline.

## 2020-09-30 NOTE — Telephone Encounter (Signed)
Copied from [place] [id number]. Topic: General - Call Back - No Documentation >> Sep 30, 2020 12:48 PM Erick Blinks wrote: Reason for CRM: Nicki calling from [facility] wants a callback to discuss his catheter management and other info.   Best contact: [phone]

## 2020-09-30 NOTE — Telephone Encounter (Signed)
Verbal given 

## 2020-09-30 NOTE — Telephone Encounter (Signed)
Nicky from home health calling asking if the vinegar irrigation that is being used for pt if it is medical grade or from over the counter vinegar as she has never heard of this. Please advise. Thanks

## 2020-10-07 DIAGNOSIS — N39498 Other specified urinary incontinence: Secondary | ICD-10-CM | POA: Diagnosis not present

## 2020-10-07 DIAGNOSIS — N319 Neuromuscular dysfunction of bladder, unspecified: Secondary | ICD-10-CM | POA: Diagnosis not present

## 2020-10-07 DIAGNOSIS — Z435 Encounter for attention to cystostomy: Secondary | ICD-10-CM | POA: Diagnosis not present

## 2020-10-07 DIAGNOSIS — F329 Major depressive disorder, single episode, unspecified: Secondary | ICD-10-CM | POA: Diagnosis not present

## 2020-10-07 DIAGNOSIS — Z466 Encounter for fitting and adjustment of urinary device: Secondary | ICD-10-CM | POA: Diagnosis not present

## 2020-10-07 DIAGNOSIS — G809 Cerebral palsy, unspecified: Secondary | ICD-10-CM | POA: Diagnosis not present

## 2020-10-11 ENCOUNTER — Other Ambulatory Visit: Payer: Self-pay | Admitting: Urology

## 2020-10-17 ENCOUNTER — Ambulatory Visit: Payer: Medicare Other | Admitting: Family Medicine

## 2020-10-22 ENCOUNTER — Other Ambulatory Visit: Payer: Self-pay

## 2020-10-22 ENCOUNTER — Encounter: Payer: Self-pay | Admitting: Family Medicine

## 2020-10-22 ENCOUNTER — Ambulatory Visit: Payer: Medicare Other | Admitting: Family Medicine

## 2020-10-22 ENCOUNTER — Ambulatory Visit (INDEPENDENT_AMBULATORY_CARE_PROVIDER_SITE_OTHER): Payer: Medicare Other | Admitting: Unknown Physician Specialty

## 2020-10-22 VITALS — BP 122/76 | HR 76 | Temp 98.6°F | Resp 16

## 2020-10-22 DIAGNOSIS — Z5181 Encounter for therapeutic drug level monitoring: Secondary | ICD-10-CM | POA: Diagnosis not present

## 2020-10-22 DIAGNOSIS — E785 Hyperlipidemia, unspecified: Secondary | ICD-10-CM | POA: Diagnosis not present

## 2020-10-22 DIAGNOSIS — F321 Major depressive disorder, single episode, moderate: Secondary | ICD-10-CM

## 2020-10-22 MED ORDER — CITALOPRAM HYDROBROMIDE 20 MG PO TABS: 20.0000 mg | ORAL_TABLET | Freq: Every day | ORAL | 3 refills | Status: DC

## 2020-10-22 NOTE — Progress Notes (Signed)
There were no vitals taken for this visit.   Subjective:    Patient ID: Gerald Montgomery, male    DOB: 05-06-1956, 65 y.o.   MRN: [id number]  HPI: Gerald Montgomery is a 65 y.o. male  No chief complaint on file.   Pt is here with his sister and giving much of hx.    Depression screen [facility] 2/9 10/22/2020 08/22/2020 04/10/2020 02/22/2020 02/08/2020  Decreased Interest 1 0 0 0 0  Down, Depressed, Hopeless 1 0 0 0 0  PHQ - 2 Score 2 0 0 0 0  Altered sleeping 0 0 - 0 -  Tired, decreased energy 1 0 - 0 -  Change in appetite 0 0 - 0 -  Feeling bad or failure about yourself  0 0 - 0 -  Trouble concentrating 2 0 - 0 -  Moving slowly or fidgety/restless 0 0 - 0 -  Suicidal thoughts 0 0 - 0 -  PHQ-9 Score 5 0 - 0 -  Difficult doing work/chores Not difficult at all Not difficult at all - Not difficult at all -  Some recent data might be hidden    Depression Put on Citalopram for sxs of depression.  He was crying all the time which has ceased.  PHQ is stable  It is difficult for pt to come in as wheelchair bound with CP.  Would like to update labs.    Hyperlipidemia Using medications without problems: No Muscle aches    Relevant past medical, surgical, family and social history reviewed and updated as indicated. Interim medical history since our last visit reviewed. Allergies and medications reviewed and updated.  Review of Systems  Per HPI unless specifically indicated above     Objective:    There were no vitals taken for this visit.  Wt Readings from Last 3 Encounters:  02/22/20 118 lb (53.5 kg)  02/18/18 118 lb 9.6 oz (53.8 kg)  12/27/17 125 lb (56.7 kg)    Physical Exam Constitutional:      General: He is not in acute distress.    Appearance: He is well-developed.     Comments: In Wheelchair  HENT:     Head: Normocephalic and atraumatic.  Eyes:     General: Lids are normal. No scleral icterus.       Right eye: No discharge.        Left eye: No discharge.      Conjunctiva/sclera: Conjunctivae normal.  Neck:     Vascular: No carotid bruit or JVD.  Cardiovascular:     Rate and Rhythm: Normal rate and regular rhythm.     Heart sounds: Normal heart sounds.  Pulmonary:     Effort: Pulmonary effort is normal. No respiratory distress.     Breath sounds: Normal breath sounds.  Abdominal:     Palpations: There is no hepatomegaly or splenomegaly.  Musculoskeletal:        General: Normal range of motion.     Cervical back: Normal range of motion and neck supple.  Skin:    General: Skin is warm and dry.     Coloration: Skin is not pale.     Findings: No rash.  Neurological:     Mental Status: He is oriented to person, place, and time.  Psychiatric:        Behavior: Behavior normal.        Thought Content: Thought content normal.        Judgment: Judgment normal.  Assessment & Plan:   Problem List Items Addressed This Visit      Unprioritized   Current moderate episode of major depressive disorder ([place])    Stable, continue present medications.        Relevant Orders   TSH   Hyperlipidemia, unspecified    Stable, continue present medications.        Relevant Orders   Lipid panel    Other Visit Diagnoses    Medication monitoring encounter    -  Primary   Relevant Orders   Comprehensive metabolic panel       Follow up plan: Return in about 6 months (around 04/23/2021).

## 2020-10-22 NOTE — Assessment & Plan Note (Signed)
Stable, continue present medications.   

## 2020-10-23 LAB — COMPREHENSIVE METABOLIC PANEL
AG Ratio: 2 (calc) (ref 1.0–2.5)
ALT: 25 U/L (ref 9–46)
AST: 22 U/L (ref 10–35)
Albumin: 4.6 g/dL (ref 3.6–5.1)
Alkaline phosphatase (APISO): 77 U/L (ref 35–144)
BUN/Creatinine Ratio: 42 (calc) — ABNORMAL HIGH (ref 6–22)
BUN: 26 mg/dL — ABNORMAL HIGH (ref 7–25)
CO2: 24 mmol/L (ref 20–32)
Calcium: 9 mg/dL (ref 8.6–10.3)
Chloride: 107 mmol/L (ref 98–110)
Creat: 0.62 mg/dL — ABNORMAL LOW (ref 0.70–1.25)
Globulin: 2.3 g/dL (calc) (ref 1.9–3.7)
Glucose, Bld: 90 mg/dL (ref 65–99)
Potassium: 4.5 mmol/L (ref 3.5–5.3)
Sodium: 140 mmol/L (ref 135–146)
Total Bilirubin: 0.3 mg/dL (ref 0.2–1.2)
Total Protein: 6.9 g/dL (ref 6.1–8.1)

## 2020-10-23 LAB — LIPID PANEL
Cholesterol: 131 mg/dL (ref <200)
HDL: 52 mg/dL (ref >=40)
LDL Cholesterol (Calc): 65 mg/dL (calc)
Non-HDL Cholesterol (Calc): 79 mg/dL (calc) (ref <130)
Total CHOL/HDL Ratio: 2.5 (calc) (ref <5.0)
Triglycerides: 64 mg/dL (ref <150)

## 2020-10-23 LAB — TSH: TSH: 1.39 mIU/L (ref 0.40–4.50)

## 2020-10-28 ENCOUNTER — Other Ambulatory Visit: Payer: Self-pay | Admitting: Family Medicine

## 2020-10-28 DIAGNOSIS — N319 Neuromuscular dysfunction of bladder, unspecified: Secondary | ICD-10-CM | POA: Diagnosis not present

## 2020-10-28 DIAGNOSIS — N39498 Other specified urinary incontinence: Secondary | ICD-10-CM | POA: Diagnosis not present

## 2020-10-28 DIAGNOSIS — F329 Major depressive disorder, single episode, unspecified: Secondary | ICD-10-CM | POA: Diagnosis not present

## 2020-10-28 DIAGNOSIS — G809 Cerebral palsy, unspecified: Secondary | ICD-10-CM | POA: Diagnosis not present

## 2020-10-28 DIAGNOSIS — Z435 Encounter for attention to cystostomy: Secondary | ICD-10-CM | POA: Diagnosis not present

## 2020-10-28 DIAGNOSIS — Z466 Encounter for fitting and adjustment of urinary device: Secondary | ICD-10-CM | POA: Diagnosis not present

## 2020-10-30 DIAGNOSIS — Z7901 Long term (current) use of anticoagulants: Secondary | ICD-10-CM | POA: Diagnosis not present

## 2020-10-30 DIAGNOSIS — G809 Cerebral palsy, unspecified: Secondary | ICD-10-CM | POA: Diagnosis not present

## 2020-10-30 DIAGNOSIS — Z466 Encounter for fitting and adjustment of urinary device: Secondary | ICD-10-CM | POA: Diagnosis not present

## 2020-10-30 DIAGNOSIS — F329 Major depressive disorder, single episode, unspecified: Secondary | ICD-10-CM | POA: Diagnosis not present

## 2020-10-30 DIAGNOSIS — Z95828 Presence of other vascular implants and grafts: Secondary | ICD-10-CM | POA: Diagnosis not present

## 2020-10-30 DIAGNOSIS — Z86718 Personal history of other venous thrombosis and embolism: Secondary | ICD-10-CM | POA: Diagnosis not present

## 2020-10-30 DIAGNOSIS — N319 Neuromuscular dysfunction of bladder, unspecified: Secondary | ICD-10-CM | POA: Diagnosis not present

## 2020-10-30 DIAGNOSIS — N39498 Other specified urinary incontinence: Secondary | ICD-10-CM | POA: Diagnosis not present

## 2020-10-30 DIAGNOSIS — Z435 Encounter for attention to cystostomy: Secondary | ICD-10-CM | POA: Diagnosis not present

## 2020-11-07 NOTE — Progress Notes (Signed)
No encounter done with provider on this day  Closing out encounter Delsa Grana, PA-C

## 2020-11-18 ENCOUNTER — Other Ambulatory Visit: Payer: Self-pay | Admitting: Family Medicine

## 2020-11-18 DIAGNOSIS — Z86718 Personal history of other venous thrombosis and embolism: Secondary | ICD-10-CM

## 2020-11-21 DIAGNOSIS — Z435 Encounter for attention to cystostomy: Secondary | ICD-10-CM | POA: Diagnosis not present

## 2020-11-21 DIAGNOSIS — G809 Cerebral palsy, unspecified: Secondary | ICD-10-CM | POA: Diagnosis not present

## 2020-11-21 DIAGNOSIS — F329 Major depressive disorder, single episode, unspecified: Secondary | ICD-10-CM | POA: Diagnosis not present

## 2020-11-21 DIAGNOSIS — N319 Neuromuscular dysfunction of bladder, unspecified: Secondary | ICD-10-CM | POA: Diagnosis not present

## 2020-11-21 DIAGNOSIS — N39498 Other specified urinary incontinence: Secondary | ICD-10-CM | POA: Diagnosis not present

## 2020-11-21 DIAGNOSIS — Z466 Encounter for fitting and adjustment of urinary device: Secondary | ICD-10-CM | POA: Diagnosis not present

## 2020-11-28 DIAGNOSIS — G809 Cerebral palsy, unspecified: Secondary | ICD-10-CM | POA: Diagnosis not present

## 2020-11-28 DIAGNOSIS — Z466 Encounter for fitting and adjustment of urinary device: Secondary | ICD-10-CM | POA: Diagnosis not present

## 2020-11-28 DIAGNOSIS — N39498 Other specified urinary incontinence: Secondary | ICD-10-CM | POA: Diagnosis not present

## 2020-11-28 DIAGNOSIS — N319 Neuromuscular dysfunction of bladder, unspecified: Secondary | ICD-10-CM | POA: Diagnosis not present

## 2020-11-28 DIAGNOSIS — Z435 Encounter for attention to cystostomy: Secondary | ICD-10-CM | POA: Diagnosis not present

## 2020-11-28 DIAGNOSIS — F329 Major depressive disorder, single episode, unspecified: Secondary | ICD-10-CM | POA: Diagnosis not present

## 2020-11-29 ENCOUNTER — Telehealth (INDEPENDENT_AMBULATORY_CARE_PROVIDER_SITE_OTHER): Payer: Medicare Other | Admitting: Family Medicine

## 2020-11-29 ENCOUNTER — Telehealth: Payer: Self-pay

## 2020-11-29 DIAGNOSIS — Z86718 Personal history of other venous thrombosis and embolism: Secondary | ICD-10-CM | POA: Diagnosis not present

## 2020-11-29 DIAGNOSIS — Z5329 Procedure and treatment not carried out because of patient's decision for other reasons: Secondary | ICD-10-CM

## 2020-11-29 DIAGNOSIS — Z435 Encounter for attention to cystostomy: Secondary | ICD-10-CM | POA: Diagnosis not present

## 2020-11-29 DIAGNOSIS — Z7901 Long term (current) use of anticoagulants: Secondary | ICD-10-CM | POA: Diagnosis not present

## 2020-11-29 DIAGNOSIS — Z466 Encounter for fitting and adjustment of urinary device: Secondary | ICD-10-CM | POA: Diagnosis not present

## 2020-11-29 DIAGNOSIS — N319 Neuromuscular dysfunction of bladder, unspecified: Secondary | ICD-10-CM | POA: Diagnosis not present

## 2020-11-29 DIAGNOSIS — Z95828 Presence of other vascular implants and grafts: Secondary | ICD-10-CM | POA: Diagnosis not present

## 2020-11-29 DIAGNOSIS — N39498 Other specified urinary incontinence: Secondary | ICD-10-CM | POA: Diagnosis not present

## 2020-11-29 DIAGNOSIS — G809 Cerebral palsy, unspecified: Secondary | ICD-10-CM | POA: Diagnosis not present

## 2020-11-29 DIAGNOSIS — F329 Major depressive disorder, single episode, unspecified: Secondary | ICD-10-CM | POA: Diagnosis not present

## 2020-11-29 NOTE — Telephone Encounter (Signed)
Copied from [place] [id number]. Topic: General - Other >> Nov 29, 2020  9:05 AM Valere Dross wrote: Reason for CRM: Pts sister called in stating the pt took a COVID test this morning and it came up negative, just for a FYI.

## 2020-12-01 DIAGNOSIS — Z20822 Contact with and (suspected) exposure to covid-19: Secondary | ICD-10-CM | POA: Diagnosis not present

## 2020-12-02 NOTE — Progress Notes (Signed)
Appt cancelled, no encounter done

## 2020-12-10 ENCOUNTER — Other Ambulatory Visit: Payer: Self-pay | Admitting: Family Medicine

## 2020-12-10 DIAGNOSIS — F39 Unspecified mood [affective] disorder: Secondary | ICD-10-CM

## 2020-12-13 DIAGNOSIS — F329 Major depressive disorder, single episode, unspecified: Secondary | ICD-10-CM | POA: Diagnosis not present

## 2020-12-13 DIAGNOSIS — Z466 Encounter for fitting and adjustment of urinary device: Secondary | ICD-10-CM | POA: Diagnosis not present

## 2020-12-13 DIAGNOSIS — N319 Neuromuscular dysfunction of bladder, unspecified: Secondary | ICD-10-CM | POA: Diagnosis not present

## 2020-12-13 DIAGNOSIS — G809 Cerebral palsy, unspecified: Secondary | ICD-10-CM | POA: Diagnosis not present

## 2020-12-13 DIAGNOSIS — N39498 Other specified urinary incontinence: Secondary | ICD-10-CM | POA: Diagnosis not present

## 2020-12-13 DIAGNOSIS — Z435 Encounter for attention to cystostomy: Secondary | ICD-10-CM | POA: Diagnosis not present

## 2020-12-29 DIAGNOSIS — N319 Neuromuscular dysfunction of bladder, unspecified: Secondary | ICD-10-CM | POA: Diagnosis not present

## 2020-12-29 DIAGNOSIS — G809 Cerebral palsy, unspecified: Secondary | ICD-10-CM | POA: Diagnosis not present

## 2020-12-29 DIAGNOSIS — Z466 Encounter for fitting and adjustment of urinary device: Secondary | ICD-10-CM | POA: Diagnosis not present

## 2020-12-29 DIAGNOSIS — N39498 Other specified urinary incontinence: Secondary | ICD-10-CM | POA: Diagnosis not present

## 2020-12-29 DIAGNOSIS — Z86718 Personal history of other venous thrombosis and embolism: Secondary | ICD-10-CM | POA: Diagnosis not present

## 2020-12-29 DIAGNOSIS — Z435 Encounter for attention to cystostomy: Secondary | ICD-10-CM | POA: Diagnosis not present

## 2020-12-29 DIAGNOSIS — F329 Major depressive disorder, single episode, unspecified: Secondary | ICD-10-CM | POA: Diagnosis not present

## 2020-12-29 DIAGNOSIS — Z7901 Long term (current) use of anticoagulants: Secondary | ICD-10-CM | POA: Diagnosis not present

## 2020-12-29 DIAGNOSIS — Z95828 Presence of other vascular implants and grafts: Secondary | ICD-10-CM | POA: Diagnosis not present

## 2021-01-01 DIAGNOSIS — Z466 Encounter for fitting and adjustment of urinary device: Secondary | ICD-10-CM | POA: Diagnosis not present

## 2021-01-01 DIAGNOSIS — G809 Cerebral palsy, unspecified: Secondary | ICD-10-CM | POA: Diagnosis not present

## 2021-01-01 DIAGNOSIS — Z435 Encounter for attention to cystostomy: Secondary | ICD-10-CM | POA: Diagnosis not present

## 2021-01-01 DIAGNOSIS — F329 Major depressive disorder, single episode, unspecified: Secondary | ICD-10-CM | POA: Diagnosis not present

## 2021-01-01 DIAGNOSIS — N39498 Other specified urinary incontinence: Secondary | ICD-10-CM | POA: Diagnosis not present

## 2021-01-01 DIAGNOSIS — N319 Neuromuscular dysfunction of bladder, unspecified: Secondary | ICD-10-CM | POA: Diagnosis not present

## 2021-01-08 ENCOUNTER — Telehealth: Payer: Self-pay | Admitting: Family Medicine

## 2021-01-08 NOTE — Telephone Encounter (Signed)
Copied from [place] [id number]. Topic: Medicare AWV >> Jan 08, 2021 11:44 AM Cher Nakai R wrote: Reason for CRM:  No answer unable to leave a  message for patient to call back and schedule Medicare Annual Wellness Visit (AWV) in office.   If unable to come into the office for AWV,  please offer to do virtually or by telephone.  Last AWV: 02/08/2020  Please schedule at anytime with [place].  40 minute appointment  Any questions, please contact me at [phone]

## 2021-01-15 DIAGNOSIS — F329 Major depressive disorder, single episode, unspecified: Secondary | ICD-10-CM | POA: Diagnosis not present

## 2021-01-15 DIAGNOSIS — Z435 Encounter for attention to cystostomy: Secondary | ICD-10-CM | POA: Diagnosis not present

## 2021-01-15 DIAGNOSIS — Z466 Encounter for fitting and adjustment of urinary device: Secondary | ICD-10-CM | POA: Diagnosis not present

## 2021-01-15 DIAGNOSIS — N39498 Other specified urinary incontinence: Secondary | ICD-10-CM | POA: Diagnosis not present

## 2021-01-15 DIAGNOSIS — G809 Cerebral palsy, unspecified: Secondary | ICD-10-CM | POA: Diagnosis not present

## 2021-01-15 DIAGNOSIS — N319 Neuromuscular dysfunction of bladder, unspecified: Secondary | ICD-10-CM | POA: Diagnosis not present

## 2021-01-23 DIAGNOSIS — N39498 Other specified urinary incontinence: Secondary | ICD-10-CM | POA: Diagnosis not present

## 2021-01-23 DIAGNOSIS — Z466 Encounter for fitting and adjustment of urinary device: Secondary | ICD-10-CM | POA: Diagnosis not present

## 2021-01-23 DIAGNOSIS — G809 Cerebral palsy, unspecified: Secondary | ICD-10-CM | POA: Diagnosis not present

## 2021-01-23 DIAGNOSIS — Z435 Encounter for attention to cystostomy: Secondary | ICD-10-CM | POA: Diagnosis not present

## 2021-01-23 DIAGNOSIS — N319 Neuromuscular dysfunction of bladder, unspecified: Secondary | ICD-10-CM | POA: Diagnosis not present

## 2021-01-23 DIAGNOSIS — F329 Major depressive disorder, single episode, unspecified: Secondary | ICD-10-CM | POA: Diagnosis not present

## 2021-01-28 DIAGNOSIS — N39498 Other specified urinary incontinence: Secondary | ICD-10-CM | POA: Diagnosis not present

## 2021-01-28 DIAGNOSIS — Z86718 Personal history of other venous thrombosis and embolism: Secondary | ICD-10-CM | POA: Diagnosis not present

## 2021-01-28 DIAGNOSIS — G809 Cerebral palsy, unspecified: Secondary | ICD-10-CM | POA: Diagnosis not present

## 2021-01-28 DIAGNOSIS — Z435 Encounter for attention to cystostomy: Secondary | ICD-10-CM | POA: Diagnosis not present

## 2021-01-28 DIAGNOSIS — N319 Neuromuscular dysfunction of bladder, unspecified: Secondary | ICD-10-CM | POA: Diagnosis not present

## 2021-01-28 DIAGNOSIS — Z466 Encounter for fitting and adjustment of urinary device: Secondary | ICD-10-CM | POA: Diagnosis not present

## 2021-01-28 DIAGNOSIS — Z95828 Presence of other vascular implants and grafts: Secondary | ICD-10-CM | POA: Diagnosis not present

## 2021-01-28 DIAGNOSIS — F329 Major depressive disorder, single episode, unspecified: Secondary | ICD-10-CM | POA: Diagnosis not present

## 2021-01-28 DIAGNOSIS — Z7901 Long term (current) use of anticoagulants: Secondary | ICD-10-CM | POA: Diagnosis not present

## 2021-02-05 DIAGNOSIS — N39498 Other specified urinary incontinence: Secondary | ICD-10-CM | POA: Diagnosis not present

## 2021-02-05 DIAGNOSIS — Z466 Encounter for fitting and adjustment of urinary device: Secondary | ICD-10-CM | POA: Diagnosis not present

## 2021-02-05 DIAGNOSIS — Z435 Encounter for attention to cystostomy: Secondary | ICD-10-CM | POA: Diagnosis not present

## 2021-02-05 DIAGNOSIS — G809 Cerebral palsy, unspecified: Secondary | ICD-10-CM | POA: Diagnosis not present

## 2021-02-05 DIAGNOSIS — N319 Neuromuscular dysfunction of bladder, unspecified: Secondary | ICD-10-CM | POA: Diagnosis not present

## 2021-02-05 DIAGNOSIS — F329 Major depressive disorder, single episode, unspecified: Secondary | ICD-10-CM | POA: Diagnosis not present

## 2021-02-18 ENCOUNTER — Ambulatory Visit (INDEPENDENT_AMBULATORY_CARE_PROVIDER_SITE_OTHER): Payer: Medicare Other

## 2021-02-18 DIAGNOSIS — Z0000 Encounter for general adult medical examination without abnormal findings: Secondary | ICD-10-CM | POA: Diagnosis not present

## 2021-02-18 NOTE — Progress Notes (Signed)
Subjective:   Gerald Montgomery is a 65 y.o. male who presents for Medicare Annual/Subsequent preventive examination.  Virtual Visit via Telephone Note  I connected with  [name] on 02/18/21 at  8:40 AM EDT by telephone and verified that I am speaking with the correct person using two identifiers.  Location: Patient: home Provider: [place] Persons participating in the virtual visit: patient & sister [place] Advisor   I discussed the limitations, risks, security and privacy concerns of performing an evaluation and management service by telephone and the availability of in person appointments. The patient expressed understanding and agreed to proceed.  Interactive audio and video telecommunications were attempted between this nurse and patient, however failed, due to patient having technical difficulties OR patient did not have access to video capability.  We continued and completed visit with audio only.  Some vital signs may be absent or patient reported.   Clemetine Marker, LPN   Review of Systems     Cardiac Risk Factors include: advanced age (>76men, >65 women);male gender;sedentary lifestyle     Objective:    There were no vitals filed for this visit. There is no height or weight on file to calculate BMI.  Advanced Directives 02/18/2021 02/08/2020 08/05/2018 12/27/2017 10/28/2017 08/24/2017 07/07/2017  Does Patient Have a Medical Advance Directive? No No Yes No No No Yes  Type of Advance Directive - - Healthcare Power of [place]  Does patient want to make changes to medical advance directive? - - - - No - Patient declined No - Patient declined No - Patient declined  Copy of [place] in Chart? - - Yes - validated most recent copy scanned in chart (See row information) - No - copy requested No - copy requested No - copy requested  Would patient like information on creating a medical advance directive? - No - Patient  declined - No - Patient declined No - Patient declined No - Patient declined No - Patient declined    Current Medications (verified) Outpatient Encounter Medications as of 02/18/2021  Medication Sig   acetaminophen (TYLENOL) 325 MG tablet Take 1 tablet (325 mg total) by mouth every 6 (six) hours as needed for mild pain (or Fever >/= 101).   ARIPiprazole (ABILIFY) 15 MG tablet TAKE 1 TABLET BY MOUTH ONCE DAILY   citalopram (CELEXA) 20 MG tablet Take 1 tablet (20 mg total) by mouth daily.   fexofenadine (ALLEGRA ALLERGY) 180 MG tablet Take 180 mg by mouth daily.   fluticasone (FLONASE) 50 MCG/ACT nasal spray Place into both nostrils daily.   metroNIDAZOLE (METROGEL) 1 % gel APPLY A SMALL AMOUNT TO AFFECTED AREA TWICE DAILY   nystatin (NYSTATIN) powder APPLY TOPICALLY 3 TIMES DAILY AS NEEDED to affected skin, follow up in office if not improving   nystatin cream (MYCOSTATIN) APPLY TOPICALLY 3 TIMES DAILY TO AFFECTED AREA   oxybutynin (DITROPAN) 5 MG tablet TAKE 1/2 TABLET BY MOUTH TWICE DAILY AS NEEDED BLADDER SPASM   pantoprazole (PROTONIX) 40 MG tablet TAKE 1 TABLET BY MOUTH ONCE DAILY   rosuvastatin (CRESTOR) 5 MG tablet Take 1 tablet (5 mg total) by mouth daily.   XARELTO 10 MG TABS tablet TAKE 1 TABLET BY MOUTH ONCE DAILY   [DISCONTINUED] cromolyn (OPTICROM) 4 % ophthalmic solution Place 1 drop into both eyes 4 (four) times daily as needed (for eye itching burning and drainage/allergies).   [DISCONTINUED] loratadine (CLARITIN) 10 MG tablet Take 10  mg by mouth daily.   Facility-Administered Encounter Medications as of 02/18/2021  Medication   lidocaine (XYLOCAINE) 2 % jelly 1 application    Allergies (verified) Macrobid [nitrofurantoin], Penicillins, and Sulfa antibiotics   History: Past Medical History:  Diagnosis Date   Cerebral palsy (HCC)    Depression    GERD (gastroesophageal reflux disease)    History of DVT of lower extremity    Mood disorder (HCC)    Neurogenic bladder     Urinary incontinence    Past Surgical History:  Procedure Laterality Date   COLONOSCOPY WITH PROPOFOL N/A 12/27/2017   Procedure: COLONOSCOPY WITH PROPOFOL;  Surgeon: Lin Landsman, MD;  Location: [facility] ENDOSCOPY;  Service: Gastroenterology;  Laterality: N/A;   ESOPHAGOGASTRODUODENOSCOPY Left 02/07/2017   Procedure: ESOPHAGOGASTRODUODENOSCOPY (EGD);  Surgeon: Thornton Park, MD;  Location: [facility];  Service: Gastroenterology;  Laterality: Left;   ESOPHAGOGASTRODUODENOSCOPY (EGD) WITH PROPOFOL  12/27/2017   Procedure: ESOPHAGOGASTRODUODENOSCOPY (EGD) WITH PROPOFOL;  Surgeon: Lin Landsman, MD;  Location: [facility];  Service: Gastroenterology;;   filter surgery for blood clot  2013   HIP FRACTURE SURGERY  2013   IR CATHETER TUBE CHANGE  07/07/2017   Family History  Problem Relation Age of Onset   Heart disease Mother    Atrial fibrillation Mother    Diabetes Mother        Borderline   Heart disease Father        Passed away of heart attack   Kidney disease Neg Hx    Prostate cancer Neg Hx    Social History   Socioeconomic History   Marital status: Single    Spouse name: Not on file   Number of children: Not on file   Years of education: Not on file   Highest education level: Not on file  Occupational History   Occupation: disabled  Tobacco Use   Smoking status: Never   Smokeless tobacco: Never  Vaping Use   Vaping Use: Never used  Substance and Sexual Activity   Alcohol use: No    Alcohol/week: 0.0 standard drinks   Drug use: No   Sexual activity: Never  Other Topics Concern   Not on file  Social History Narrative   Not on file   Social Determinants of Health   Financial Resource Strain: Low Risk    Difficulty of Paying Living Expenses: Not hard at all  Food Insecurity: No Food Insecurity   Worried About Charity fundraiser in the Last Year: Never true   [place] in the Last Year: Never true  Transportation Needs: No Transportation  Needs   Lack of Transportation (Medical): No   Lack of Transportation (Non-Medical): No  Physical Activity: Inactive   Days of Exercise per Week: 0 days   Minutes of Exercise per Session: 0 min  Stress: No Stress Concern Present   Feeling of Stress : Only a little  Social Connections: Socially Isolated   Frequency of Communication with Friends and Family: More than three times a week   Frequency of Social Gatherings with Friends and Family: More than three times a week   Attends Religious Services: Never   Marine scientist or Organizations: No   Attends Music therapist: Never   Marital Status: Never married    Tobacco Counseling Counseling given: Not Answered   Clinical Intake:  Pre-visit preparation completed: Yes  Pain : No/denies pain     Nutritional Risks: None Diabetes: No  How often  do you need to have someone help you when you read instructions, pamphlets, or other written materials from your doctor or pharmacy?: 4 - Often    Interpreter Needed?: No  Information entered by :: Clemetine Marker LPN   Activities of Daily Living In your present state of health, do you have any difficulty performing the following activities: 02/18/2021 10/22/2020  Hearing? N N  Vision? N N  Difficulty concentrating or making decisions? Tempie Donning  Walking or climbing stairs? Y Y  Dressing or bathing? Y Y  Doing errands, shopping? Tempie Donning  Preparing Food and eating ? Y -  Using the Toilet? Y -  In the past six months, have you accidently leaked urine? Y -  Do you have problems with loss of bowel control? Y -  Managing your Medications? Y -  Managing your Finances? Y -  Housekeeping or managing your Housekeeping? Y -  Some recent data might be hidden    Patient Care Team: Delsa Grana, PA-C as PCP - General (Family Medicine) Nickie Retort, MD (Inactive) as Consulting Physician (Urology)  Indicate any recent Medical Services you may have received from other than  [facility] providers in the past year (date may be approximate).     Assessment:   This is a routine wellness examination for Rajohn.  Hearing/Vision screen Hearing Screening - Comments:: Pt denies hearing difficulty Vision Screening - Comments:: Not established with eye provider  Dietary issues and exercise activities discussed: Current Exercise Habits: The patient does not participate in regular exercise at present, Exercise limited by: orthopedic condition(s);neurologic condition(s)   Goals Addressed   None    Depression Screen PHQ 2/9 Scores 02/18/2021 10/22/2020 08/22/2020 04/10/2020 02/22/2020 02/08/2020 10/05/2019  PHQ - 2 Score 0 2 0 0 0 0 2  PHQ- 9 Score - 5 0 - 0 - 6    Fall Risk Fall Risk  02/18/2021 10/22/2020 08/22/2020 04/10/2020 02/22/2020  Falls in the past year? 0 0 0 0 0  Number falls in past yr: 0 0 0 0 0  Injury with Fall? 0 0 0 0 0  Risk for fall due to : Impaired balance/gait;Impaired mobility - - - -  Follow up Falls prevention discussed - - Falls evaluation completed -    FALL RISK PREVENTION PERTAINING TO THE HOME:  Any stairs in or around the home? No  If so, are there any without handrails? No  Home free of loose throw rugs in walkways, pet beds, electrical cords, etc? Yes  Adequate lighting in your home to reduce risk of falls? Yes   ASSISTIVE DEVICES UTILIZED TO PREVENT FALLS:  Life alert? No  Use of a cane, walker or w/c? Yes  Grab bars in the bathroom? No  Shower chair or bench in shower? No  Elevated toilet seat or a handicapped toilet? No   TIMED UP AND GO:  Was the test performed? No . Telephonic visit.   Cognitive Function: Cognitive status assessed by direct observation. Patient is unable to complete screening 6CIT or MMSE.          Immunizations Immunization History  Administered Date(s) Administered   Influenza,inj,Quad PF,6+ Mos 03/04/2016, 02/06/2017, 02/18/2018, 01/26/2019, 03/11/2020    TDAP status: Due, Education has been  provided regarding the importance of this vaccine. Advised may receive this vaccine at local pharmacy or Health Dept. Aware to provide a copy of the vaccination record if obtained from local pharmacy or Health Dept. Verbalized acceptance and understanding.  Flu Vaccine status:  Due, Education has been provided regarding the importance of this vaccine. Advised may receive this vaccine at local pharmacy or Health Dept. Aware to provide a copy of the vaccination record if obtained from local pharmacy or Health Dept. Verbalized acceptance and understanding.  Pneumococcal vaccine status: Due, Education has been provided regarding the importance of this vaccine. Advised may receive this vaccine at local pharmacy or Health Dept. Aware to provide a copy of the vaccination record if obtained from local pharmacy or Health Dept. Verbalized acceptance and understanding.  Covid-19 vaccine status: Declined, Education has been provided regarding the importance of this vaccine but patient still declined. Advised may receive this vaccine at local pharmacy or Health Dept.or vaccine clinic. Aware to provide a copy of the vaccination record if obtained from local pharmacy or Health Dept. Verbalized acceptance and understanding.  Qualifies for Shingles Vaccine? Yes   Zostavax completed No   Shingrix Completed?: No.    Education has been provided regarding the importance of this vaccine. Patient has been advised to call insurance company to determine out of pocket expense if they have not yet received this vaccine. Advised may also receive vaccine at local pharmacy or Health Dept. Verbalized acceptance and understanding.  Screening Tests Health Maintenance  Topic Date Due   Zoster Vaccines- Shingrix (1 of 2) Never done   INFLUENZA VACCINE  12/23/2020   Hepatitis C Screening  04/10/2021 (Originally 01/29/1974)   TETANUS/TDAP  10/07/2023 (Originally 01/29/1975)   COLONOSCOPY (Pts 45-85yrs Insurance coverage will need to be  confirmed)  12/28/2027   HIV Screening  Completed   HPV VACCINES  Aged Out    Health Maintenance  Health Maintenance Due  Topic Date Due   Zoster Vaccines- Shingrix (1 of 2) Never done   INFLUENZA VACCINE  12/23/2020    Colorectal cancer screening: Type of screening: Colonoscopy. Completed 12/27/17. Repeat every 10 years  Lung Cancer Screening: (Low Dose CT Chest recommended if Age 61-80 years, 30 pack-year currently smoking OR have quit w/in 15years.) does not qualify.   Additional Screening:  Hepatitis C Screening: does qualify; postponed  Vision Screening: Recommended annual ophthalmology exams for early detection of glaucoma and other disorders of the eye. Is the patient up to date with their annual eye exam?  No  Who is the provider or what is the name of the office in which the patient attends annual eye exams? Not established.   Dental Screening: Recommended annual dental exams for proper oral hygiene  Community Resource Referral / Chronic Care Management: CRR required this visit?  No   CCM required this visit?  No      Plan:     I have personally reviewed and noted the following in the patient's chart:   Medical and social history Use of alcohol, tobacco or illicit drugs  Current medications and supplements including opioid prescriptions. Patient is not currently taking opioid prescriptions. Functional ability and status Nutritional status Physical activity Advanced directives List of other physicians Hospitalizations, surgeries, and ER visits in previous 12 months Vitals Screenings to include cognitive, depression, and falls Referrals and appointments  In addition, I have reviewed and discussed with patient certain preventive protocols, quality metrics, and best practice recommendations. A written personalized care plan for preventive services as well as general preventive health recommendations were provided to patient.     Clemetine Marker, LPN   [id number]/[id number]    Nurse Notes: none

## 2021-02-18 NOTE — Patient Instructions (Signed)
[name] , Thank you for taking time to come for your Medicare Wellness Visit. I appreciate your ongoing commitment to your health goals. Please review the following plan we discussed and let me know if I can assist you in the future.   Screening recommendations/referrals: Colonoscopy: done 12/27/17. Repeat 12/2027 Recommended yearly ophthalmology/optometry visit for glaucoma screening and checkup Recommended yearly dental visit for hygiene and checkup  Vaccinations: Influenza vaccine: due Pneumococcal vaccine: due Tdap vaccine: due Shingles vaccine: Shingrix discussed. Please contact your pharmacy for coverage information.  Covid-19: declined  Conditions/risks identified: Recommend continuing physical therapy  Next appointment: Follow up in one year for your annual wellness visit   Preventive Care 40-64 Years, Male Preventive care refers to lifestyle choices and visits with your health care provider that can promote health and wellness. What does preventive care include? A yearly physical exam. This is also called an annual well check. Dental exams once or twice a year. Routine eye exams. Ask your health care provider how often you should have your eyes checked. Personal lifestyle choices, including: Daily care of your teeth and gums. Regular physical activity. Eating a healthy diet. Avoiding tobacco and drug use. Limiting alcohol use. Practicing safe sex. Taking low-dose aspirin every day starting at age 63. What happens during an annual well check? The services and screenings done by your health care provider during your annual well check will depend on your age, overall health, lifestyle risk factors, and family history of disease. Counseling  Your health care provider may ask you questions about your: Alcohol use. Tobacco use. Drug use. Emotional well-being. Home and relationship well-being. Sexual activity. Eating habits. Work and work Statistician. Screening  You  may have the following tests or measurements: Height, weight, and BMI. Blood pressure. Lipid and cholesterol levels. These may be checked every 5 years, or more frequently if you are over 34 years old. Skin check. Lung cancer screening. You may have this screening every year starting at age 29 if you have a 30-pack-year history of smoking and currently smoke or have quit within the past 15 years. Fecal occult blood test (FOBT) of the stool. You may have this test every year starting at age 6. Flexible sigmoidoscopy or colonoscopy. You may have a sigmoidoscopy every 5 years or a colonoscopy every 10 years starting at age 60. Prostate cancer screening. Recommendations will vary depending on your family history and other risks. Hepatitis C blood test. Hepatitis B blood test. Sexually transmitted disease (STD) testing. Diabetes screening. This is done by checking your blood sugar (glucose) after you have not eaten for a while (fasting). You may have this done every 1-3 years. Discuss your test results, treatment options, and if necessary, the need for more tests with your health care provider. Vaccines  Your health care provider may recommend certain vaccines, such as: Influenza vaccine. This is recommended every year. Tetanus, diphtheria, and acellular pertussis (Tdap, Td) vaccine. You may need a Td booster every 10 years. Zoster vaccine. You may need this after age 20. Pneumococcal 13-valent conjugate (PCV13) vaccine. You may need this if you have certain conditions and have not been vaccinated. Pneumococcal polysaccharide (PPSV23) vaccine. You may need one or two doses if you smoke cigarettes or if you have certain conditions. Talk to your health care provider about which screenings and vaccines you need and how often you need them. This information is not intended to replace advice given to you by your health care provider. Make sure you discuss any  questions you have with your health care  provider. Document Released: 06/07/2015 Document Revised: 01/29/2016 Document Reviewed: 03/12/2015 Elsevier Interactive Patient Education  2017 [place] Prevention in the Home Falls can cause injuries. They can happen to people of all ages. There are many things you can do to make your home safe and to help prevent falls. What can I do on the outside of my home? Regularly fix the edges of walkways and driveways and fix any cracks. Remove anything that might make you trip as you walk through a door, such as a raised step or threshold. Trim any bushes or trees on the path to your home. Use bright outdoor lighting. Clear any walking paths of anything that might make someone trip, such as rocks or tools. Regularly check to see if handrails are loose or broken. Make sure that both sides of any steps have handrails. Any raised decks and porches should have guardrails on the edges. Have any leaves, snow, or ice cleared regularly. Use sand or salt on walking paths during winter. Clean up any spills in your garage right away. This includes oil or grease spills. What can I do in the bathroom? Use night lights. Install grab bars by the toilet and in the tub and shower. Do not use towel bars as grab bars. Use non-skid mats or decals in the tub or shower. If you need to sit down in the shower, use a plastic, non-slip stool. Keep the floor dry. Clean up any water that spills on the floor as soon as it happens. Remove soap buildup in the tub or shower regularly. Attach bath mats securely with double-sided non-slip rug tape. Do not have throw rugs and other things on the floor that can make you trip. What can I do in the bedroom? Use night lights. Make sure that you have a light by your bed that is easy to reach. Do not use any sheets or blankets that are too big for your bed. They should not hang down onto the floor. Have a firm chair that has side arms. You can use this for support while  you get dressed. Do not have throw rugs and other things on the floor that can make you trip. What can I do in the kitchen? Clean up any spills right away. Avoid walking on wet floors. Keep items that you use a lot in easy-to-reach places. If you need to reach something above you, use a strong step stool that has a grab bar. Keep electrical cords out of the way. Do not use floor polish or wax that makes floors slippery. If you must use wax, use non-skid floor wax. Do not have throw rugs and other things on the floor that can make you trip. What can I do with my stairs? Do not leave any items on the stairs. Make sure that there are handrails on both sides of the stairs and use them. Fix handrails that are broken or loose. Make sure that handrails are as long as the stairways. Check any carpeting to make sure that it is firmly attached to the stairs. Fix any carpet that is loose or worn. Avoid having throw rugs at the top or bottom of the stairs. If you do have throw rugs, attach them to the floor with carpet tape. Make sure that you have a light switch at the top of the stairs and the bottom of the stairs. If you do not have them, ask someone to add them  for you. What else can I do to help prevent falls? Wear shoes that: Do not have high heels. Have rubber bottoms. Are comfortable and fit you well. Are closed at the toe. Do not wear sandals. If you use a stepladder: Make sure that it is fully opened. Do not climb a closed stepladder. Make sure that both sides of the stepladder are locked into place. Ask someone to hold it for you, if possible. Clearly mark and make sure that you can see: Any grab bars or handrails. First and last steps. Where the edge of each step is. Use tools that help you move around (mobility aids) if they are needed. These include: Canes. Walkers. Scooters. Crutches. Turn on the lights when you go into a dark area. Replace any light bulbs as soon as they burn  out. Set up your furniture so you have a clear path. Avoid moving your furniture around. If any of your floors are uneven, fix them. If there are any pets around you, be aware of where they are. Review your medicines with your doctor. Some medicines can make you feel dizzy. This can increase your chance of falling. Ask your doctor what other things that you can do to help prevent falls. This information is not intended to replace advice given to you by your health care provider. Make sure you discuss any questions you have with your health care provider. Document Released: 03/07/2009 Document Revised: 10/17/2015 Document Reviewed: 06/15/2014 Elsevier Interactive Patient Education  2017 [employer].

## 2021-02-24 DIAGNOSIS — N319 Neuromuscular dysfunction of bladder, unspecified: Secondary | ICD-10-CM | POA: Diagnosis not present

## 2021-02-24 DIAGNOSIS — N39498 Other specified urinary incontinence: Secondary | ICD-10-CM | POA: Diagnosis not present

## 2021-02-24 DIAGNOSIS — F329 Major depressive disorder, single episode, unspecified: Secondary | ICD-10-CM | POA: Diagnosis not present

## 2021-02-24 DIAGNOSIS — Z466 Encounter for fitting and adjustment of urinary device: Secondary | ICD-10-CM | POA: Diagnosis not present

## 2021-02-24 DIAGNOSIS — Z435 Encounter for attention to cystostomy: Secondary | ICD-10-CM | POA: Diagnosis not present

## 2021-02-24 DIAGNOSIS — G809 Cerebral palsy, unspecified: Secondary | ICD-10-CM | POA: Diagnosis not present

## 2021-02-27 DIAGNOSIS — N319 Neuromuscular dysfunction of bladder, unspecified: Secondary | ICD-10-CM | POA: Diagnosis not present

## 2021-02-27 DIAGNOSIS — F329 Major depressive disorder, single episode, unspecified: Secondary | ICD-10-CM | POA: Diagnosis not present

## 2021-02-27 DIAGNOSIS — G809 Cerebral palsy, unspecified: Secondary | ICD-10-CM | POA: Diagnosis not present

## 2021-02-27 DIAGNOSIS — Z435 Encounter for attention to cystostomy: Secondary | ICD-10-CM | POA: Diagnosis not present

## 2021-02-27 DIAGNOSIS — Z466 Encounter for fitting and adjustment of urinary device: Secondary | ICD-10-CM | POA: Diagnosis not present

## 2021-02-27 DIAGNOSIS — Z7901 Long term (current) use of anticoagulants: Secondary | ICD-10-CM | POA: Diagnosis not present

## 2021-02-27 DIAGNOSIS — N39498 Other specified urinary incontinence: Secondary | ICD-10-CM | POA: Diagnosis not present

## 2021-02-27 DIAGNOSIS — Z86718 Personal history of other venous thrombosis and embolism: Secondary | ICD-10-CM | POA: Diagnosis not present

## 2021-02-27 DIAGNOSIS — Z95828 Presence of other vascular implants and grafts: Secondary | ICD-10-CM | POA: Diagnosis not present

## 2021-03-17 ENCOUNTER — Other Ambulatory Visit: Payer: Self-pay | Admitting: Urology

## 2021-03-17 DIAGNOSIS — Z435 Encounter for attention to cystostomy: Secondary | ICD-10-CM | POA: Diagnosis not present

## 2021-03-17 DIAGNOSIS — Z466 Encounter for fitting and adjustment of urinary device: Secondary | ICD-10-CM | POA: Diagnosis not present

## 2021-03-17 DIAGNOSIS — F329 Major depressive disorder, single episode, unspecified: Secondary | ICD-10-CM | POA: Diagnosis not present

## 2021-03-17 DIAGNOSIS — N319 Neuromuscular dysfunction of bladder, unspecified: Secondary | ICD-10-CM | POA: Diagnosis not present

## 2021-03-17 DIAGNOSIS — N39498 Other specified urinary incontinence: Secondary | ICD-10-CM | POA: Diagnosis not present

## 2021-03-17 DIAGNOSIS — G809 Cerebral palsy, unspecified: Secondary | ICD-10-CM | POA: Diagnosis not present

## 2021-03-24 DIAGNOSIS — N319 Neuromuscular dysfunction of bladder, unspecified: Secondary | ICD-10-CM | POA: Diagnosis not present

## 2021-03-24 DIAGNOSIS — N39498 Other specified urinary incontinence: Secondary | ICD-10-CM | POA: Diagnosis not present

## 2021-03-24 DIAGNOSIS — Z435 Encounter for attention to cystostomy: Secondary | ICD-10-CM | POA: Diagnosis not present

## 2021-03-24 DIAGNOSIS — G809 Cerebral palsy, unspecified: Secondary | ICD-10-CM | POA: Diagnosis not present

## 2021-03-24 DIAGNOSIS — F329 Major depressive disorder, single episode, unspecified: Secondary | ICD-10-CM | POA: Diagnosis not present

## 2021-03-24 DIAGNOSIS — Z466 Encounter for fitting and adjustment of urinary device: Secondary | ICD-10-CM | POA: Diagnosis not present

## 2021-03-29 DIAGNOSIS — F329 Major depressive disorder, single episode, unspecified: Secondary | ICD-10-CM | POA: Diagnosis not present

## 2021-03-29 DIAGNOSIS — K219 Gastro-esophageal reflux disease without esophagitis: Secondary | ICD-10-CM | POA: Diagnosis not present

## 2021-03-29 DIAGNOSIS — E785 Hyperlipidemia, unspecified: Secondary | ICD-10-CM | POA: Diagnosis not present

## 2021-03-29 DIAGNOSIS — N39498 Other specified urinary incontinence: Secondary | ICD-10-CM | POA: Diagnosis not present

## 2021-03-29 DIAGNOSIS — Z435 Encounter for attention to cystostomy: Secondary | ICD-10-CM | POA: Diagnosis not present

## 2021-03-29 DIAGNOSIS — Z86718 Personal history of other venous thrombosis and embolism: Secondary | ICD-10-CM | POA: Diagnosis not present

## 2021-03-29 DIAGNOSIS — N319 Neuromuscular dysfunction of bladder, unspecified: Secondary | ICD-10-CM | POA: Diagnosis not present

## 2021-03-29 DIAGNOSIS — Z95828 Presence of other vascular implants and grafts: Secondary | ICD-10-CM | POA: Diagnosis not present

## 2021-03-29 DIAGNOSIS — Z466 Encounter for fitting and adjustment of urinary device: Secondary | ICD-10-CM | POA: Diagnosis not present

## 2021-03-29 DIAGNOSIS — Z7901 Long term (current) use of anticoagulants: Secondary | ICD-10-CM | POA: Diagnosis not present

## 2021-03-29 DIAGNOSIS — G809 Cerebral palsy, unspecified: Secondary | ICD-10-CM | POA: Diagnosis not present

## 2021-04-07 DIAGNOSIS — G809 Cerebral palsy, unspecified: Secondary | ICD-10-CM | POA: Diagnosis not present

## 2021-04-07 DIAGNOSIS — Z466 Encounter for fitting and adjustment of urinary device: Secondary | ICD-10-CM | POA: Diagnosis not present

## 2021-04-07 DIAGNOSIS — F329 Major depressive disorder, single episode, unspecified: Secondary | ICD-10-CM | POA: Diagnosis not present

## 2021-04-07 DIAGNOSIS — Z435 Encounter for attention to cystostomy: Secondary | ICD-10-CM | POA: Diagnosis not present

## 2021-04-07 DIAGNOSIS — N319 Neuromuscular dysfunction of bladder, unspecified: Secondary | ICD-10-CM | POA: Diagnosis not present

## 2021-04-07 DIAGNOSIS — N39498 Other specified urinary incontinence: Secondary | ICD-10-CM | POA: Diagnosis not present

## 2021-04-14 ENCOUNTER — Other Ambulatory Visit: Payer: Self-pay | Admitting: Family Medicine

## 2021-04-28 ENCOUNTER — Telehealth (INDEPENDENT_AMBULATORY_CARE_PROVIDER_SITE_OTHER): Payer: Medicare Other | Admitting: Internal Medicine

## 2021-04-28 ENCOUNTER — Encounter: Payer: Medicare Other | Admitting: Internal Medicine

## 2021-04-28 DIAGNOSIS — G809 Cerebral palsy, unspecified: Secondary | ICD-10-CM | POA: Diagnosis not present

## 2021-04-28 DIAGNOSIS — Z95828 Presence of other vascular implants and grafts: Secondary | ICD-10-CM | POA: Diagnosis not present

## 2021-04-28 DIAGNOSIS — K219 Gastro-esophageal reflux disease without esophagitis: Secondary | ICD-10-CM

## 2021-04-28 DIAGNOSIS — N39498 Other specified urinary incontinence: Secondary | ICD-10-CM | POA: Diagnosis not present

## 2021-04-28 DIAGNOSIS — J309 Allergic rhinitis, unspecified: Secondary | ICD-10-CM | POA: Diagnosis not present

## 2021-04-28 DIAGNOSIS — E785 Hyperlipidemia, unspecified: Secondary | ICD-10-CM | POA: Diagnosis not present

## 2021-04-28 DIAGNOSIS — Z9359 Other cystostomy status: Secondary | ICD-10-CM

## 2021-04-28 DIAGNOSIS — N319 Neuromuscular dysfunction of bladder, unspecified: Secondary | ICD-10-CM | POA: Diagnosis not present

## 2021-04-28 DIAGNOSIS — Z435 Encounter for attention to cystostomy: Secondary | ICD-10-CM | POA: Diagnosis not present

## 2021-04-28 DIAGNOSIS — F329 Major depressive disorder, single episode, unspecified: Secondary | ICD-10-CM | POA: Diagnosis not present

## 2021-04-28 DIAGNOSIS — Z7901 Long term (current) use of anticoagulants: Secondary | ICD-10-CM | POA: Diagnosis not present

## 2021-04-28 DIAGNOSIS — Z86718 Personal history of other venous thrombosis and embolism: Secondary | ICD-10-CM

## 2021-04-28 DIAGNOSIS — Z466 Encounter for fitting and adjustment of urinary device: Secondary | ICD-10-CM | POA: Diagnosis not present

## 2021-04-28 DIAGNOSIS — F331 Major depressive disorder, recurrent, moderate: Secondary | ICD-10-CM

## 2021-04-28 MED ORDER — TIZANIDINE HCL 2 MG PO CAPS: 2.0000 mg | ORAL_CAPSULE | Freq: Three times a day (TID) | ORAL | 0 refills | Status: DC

## 2021-04-28 NOTE — Progress Notes (Signed)
Virtual Visit via Video Note  I connected with Gerald Montgomery on 04/28/21 at 11:20 AM EST by a video enabled telemedicine application and verified that I am speaking with the correct person using two identifiers.  Location: Patient: Home Provider: [facility]   I discussed the limitations of evaluation and management by telemedicine and the availability of in person appointments. The patient expressed understanding and agreed to proceed.  History of Present Illness:  Gerald Montgomery is a 65 year old male presenting via telephone for follow up. Sister present and provided the history. Chronic medical conditions include Cerebral Palsy, hx of DVT, HLD, anemia. Wheelchair  CP: -Lives with mother but sister is main care giver -Uses wheelchair -Has home health nursing and aids -Not very cooperative when working with therapy, appears very tense, especially in the mornings  MDD: -Celexa 20, Abilify 15 currently -Moods stable  HLD: -Medications: Crestor 5 mg  -Patient is compliant with above medications and reports no side effects.  -Last lipid panel: 5/22 TC 131, HDL 52, triglycerides 64, LDL 65  GERD: -Protonix 40 mg, symptoms well controlled.  Hx of DVT, 3-4 years ago: -DVT 3-4 years ago -Currently on Xarelto doing well, did have one occasion of bleeding in urine last year but denies this currently or any other abnormal bleeding.   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 Cerebral palsy, unspecified type [facility] Overall stable, we will try a low dose muscle relaxer to help him  with morning stiffness and mobility.   - tizanidine (ZANAFLEX) 2 MG capsule; Take 1 capsule (2 mg total) by mouth 3 (three) times daily.  Dispense: 30 capsule; Refill: 0  2. Moderate episode of recurrent major depressive disorder ([place]): Stable, no changes to medications made today.  3. Hyperlipidemia, unspecified hyperlipidemia type: Stable, continue statin, recheck lipid panel in 6 months.   4. Suprapubic catheter ([place]): Stable, no issues, no changes to medications made, continue to follow with Urology.  5. Gastroesophageal reflux disease without esophagitis: Stable, continue PPI.  6. History of DVT of lower extremity: Stable, on Xarelto, continue to monitor.   7. Allergic rhinitis, unspecified seasonality, unspecified trigger: Stable, discussed switching to another anti-histamine, continue Flonase.   Follow up: 6 months in person    I discussed the assessment and treatment plan with the patient. The patient was provided an opportunity to ask questions and all were answered. The patient agreed with the plan and demonstrated an understanding of the instructions.   The patient was advised to call back or seek an in-person evaluation if the symptoms worsen or if the condition fails to improve as anticipated.  I provided 33 minutes of non-face-to-face time during this encounter.   Teodora Medici, DO

## 2021-04-28 NOTE — Addendum Note (Signed)
Addended by: Teodora Medici on: 04/28/2021 01:01 PM   Modules accepted: Orders

## 2021-04-28 NOTE — Progress Notes (Signed)
This encounter was created in error - please disregard.

## 2021-05-06 ENCOUNTER — Telehealth: Payer: Self-pay

## 2021-05-06 NOTE — Telephone Encounter (Signed)
° °  Telephone encounter was:  Successful.  05/06/2021 Name: Gerald Montgomery MRN: [id number] DOB: [birth year]  Gerald Montgomery is a 66 y.o. year old male who is a primary care patient of Delsa Grana, [place] . The community resource team was consulted for assistance with Transportation Needs   Care guide performed the following interventions: Spoke with patient's sister [name] about [place] transportation and [employer].  She plans to call and set-up the patient's transportation and call to apply for Medicaid.   Follow Up Plan:  Care guide will follow up with patient by phone over the next 7-10 days.  Norfleet Capers, AAS Paralegal, [place] [facility]  [address], [place] [address] ??[email or web address]   ?? [phone]   [email or web address]

## 2021-05-19 ENCOUNTER — Other Ambulatory Visit: Payer: Self-pay | Admitting: Family Medicine

## 2021-05-19 DIAGNOSIS — Z86718 Personal history of other venous thrombosis and embolism: Secondary | ICD-10-CM

## 2021-05-19 DIAGNOSIS — E7800 Pure hypercholesterolemia, unspecified: Secondary | ICD-10-CM

## 2021-05-20 ENCOUNTER — Other Ambulatory Visit: Payer: Self-pay

## 2021-05-20 DIAGNOSIS — G809 Cerebral palsy, unspecified: Secondary | ICD-10-CM | POA: Diagnosis not present

## 2021-05-20 DIAGNOSIS — N319 Neuromuscular dysfunction of bladder, unspecified: Secondary | ICD-10-CM | POA: Diagnosis not present

## 2021-05-20 DIAGNOSIS — E7800 Pure hypercholesterolemia, unspecified: Secondary | ICD-10-CM

## 2021-05-20 DIAGNOSIS — F329 Major depressive disorder, single episode, unspecified: Secondary | ICD-10-CM | POA: Diagnosis not present

## 2021-05-20 DIAGNOSIS — Z466 Encounter for fitting and adjustment of urinary device: Secondary | ICD-10-CM | POA: Diagnosis not present

## 2021-05-20 DIAGNOSIS — N39498 Other specified urinary incontinence: Secondary | ICD-10-CM | POA: Diagnosis not present

## 2021-05-20 DIAGNOSIS — Z435 Encounter for attention to cystostomy: Secondary | ICD-10-CM | POA: Diagnosis not present

## 2021-05-21 MED ORDER — ROSUVASTATIN CALCIUM 5 MG PO TABS
5.0000 mg | ORAL_TABLET | Freq: Every day | ORAL | 3 refills | Status: DC
Start: 2021-05-21 — End: 2022-03-26

## 2021-05-21 MED ORDER — NYSTATIN 100000 UNIT/GM EX POWD: CUTANEOUS | 1 refills | Status: DC

## 2021-05-22 ENCOUNTER — Telehealth: Payer: Self-pay

## 2021-05-22 NOTE — Telephone Encounter (Signed)
° °  Telephone encounter was:  Successful.  05/22/2021 Name: [name] MRN: [id number] DOB: 03/13/1956  Gerald Montgomery is a 65 y.o. year old male who is a primary care patient of Delsa Grana, [place] . The community resource team was consulted for assistance with Transportation Needs   Care guide performed the following interventions: Per message from Cyndia Bent patient's sister [name] will call me at another time patient does not have an appt until later in 2023, so she is not going to worrk about it right now. Closing referral for now will update chart when I hear from patient's sister.   Follow Up Plan:  No further follow up planned at this time. The patient has been provided with needed resources.  Sylwia Cuervo, AAS Paralegal, [place] [employer]  [address], [place] [address] ??[email or web address]   ?? [phone]   [email or web address]

## 2021-05-22 NOTE — Telephone Encounter (Signed)
° °  Telephone encounter was:  Unsuccessful.  05/22/2021 Name: Gerald Montgomery MRN: [id number] DOB: [birth year]  Unsuccessful outbound call made today to assist with:  Transportation Needs   Outreach Attempt:  2nd Attempt  Unable to leave a message for patient's sister Gerald Montgomery voicemail is not setup.  Amaliya Whitelaw, AAS Paralegal, [place] [facility]  [address], [place] [address] ??[email or web address]   ?? [phone]   [email or web address]

## 2021-05-22 NOTE — Telephone Encounter (Signed)
[name], pt sister called back.  She said she will call you another time, pt does not have appt until later in 2023, so she is not going to worry about it right now.

## 2021-05-27 DIAGNOSIS — N319 Neuromuscular dysfunction of bladder, unspecified: Secondary | ICD-10-CM | POA: Diagnosis not present

## 2021-05-27 DIAGNOSIS — F329 Major depressive disorder, single episode, unspecified: Secondary | ICD-10-CM | POA: Diagnosis not present

## 2021-05-27 DIAGNOSIS — G809 Cerebral palsy, unspecified: Secondary | ICD-10-CM | POA: Diagnosis not present

## 2021-05-27 DIAGNOSIS — Z466 Encounter for fitting and adjustment of urinary device: Secondary | ICD-10-CM | POA: Diagnosis not present

## 2021-05-27 DIAGNOSIS — N39498 Other specified urinary incontinence: Secondary | ICD-10-CM | POA: Diagnosis not present

## 2021-05-27 DIAGNOSIS — Z435 Encounter for attention to cystostomy: Secondary | ICD-10-CM | POA: Diagnosis not present

## 2021-05-28 DIAGNOSIS — Z7901 Long term (current) use of anticoagulants: Secondary | ICD-10-CM | POA: Diagnosis not present

## 2021-05-28 DIAGNOSIS — N319 Neuromuscular dysfunction of bladder, unspecified: Secondary | ICD-10-CM | POA: Diagnosis not present

## 2021-05-28 DIAGNOSIS — Z95828 Presence of other vascular implants and grafts: Secondary | ICD-10-CM | POA: Diagnosis not present

## 2021-05-28 DIAGNOSIS — E785 Hyperlipidemia, unspecified: Secondary | ICD-10-CM | POA: Diagnosis not present

## 2021-05-28 DIAGNOSIS — Z466 Encounter for fitting and adjustment of urinary device: Secondary | ICD-10-CM | POA: Diagnosis not present

## 2021-05-28 DIAGNOSIS — Z435 Encounter for attention to cystostomy: Secondary | ICD-10-CM | POA: Diagnosis not present

## 2021-05-28 DIAGNOSIS — G809 Cerebral palsy, unspecified: Secondary | ICD-10-CM | POA: Diagnosis not present

## 2021-05-28 DIAGNOSIS — Z86718 Personal history of other venous thrombosis and embolism: Secondary | ICD-10-CM | POA: Diagnosis not present

## 2021-05-28 DIAGNOSIS — M2450 Contracture, unspecified joint: Secondary | ICD-10-CM | POA: Diagnosis not present

## 2021-05-28 DIAGNOSIS — F329 Major depressive disorder, single episode, unspecified: Secondary | ICD-10-CM | POA: Diagnosis not present

## 2021-05-28 DIAGNOSIS — K219 Gastro-esophageal reflux disease without esophagitis: Secondary | ICD-10-CM | POA: Diagnosis not present

## 2021-05-28 DIAGNOSIS — N39498 Other specified urinary incontinence: Secondary | ICD-10-CM | POA: Diagnosis not present

## 2021-06-06 DIAGNOSIS — G809 Cerebral palsy, unspecified: Secondary | ICD-10-CM | POA: Diagnosis not present

## 2021-06-06 DIAGNOSIS — N319 Neuromuscular dysfunction of bladder, unspecified: Secondary | ICD-10-CM | POA: Diagnosis not present

## 2021-06-06 DIAGNOSIS — Z435 Encounter for attention to cystostomy: Secondary | ICD-10-CM | POA: Diagnosis not present

## 2021-06-06 DIAGNOSIS — F329 Major depressive disorder, single episode, unspecified: Secondary | ICD-10-CM | POA: Diagnosis not present

## 2021-06-06 DIAGNOSIS — Z466 Encounter for fitting and adjustment of urinary device: Secondary | ICD-10-CM | POA: Diagnosis not present

## 2021-06-06 DIAGNOSIS — N39498 Other specified urinary incontinence: Secondary | ICD-10-CM | POA: Diagnosis not present

## 2021-06-09 ENCOUNTER — Other Ambulatory Visit: Payer: Self-pay | Admitting: Family Medicine

## 2021-06-09 DIAGNOSIS — F39 Unspecified mood [affective] disorder: Secondary | ICD-10-CM

## 2021-06-09 NOTE — Telephone Encounter (Signed)
Requested medication (s) are due for refill today: yes  Requested medication (s) are on the active medication list: yes  Last refill:  12/10/20 #90/1  Future visit scheduled: no  Notes to clinic:  .rxde     Requested Prescriptions  Pending Prescriptions Disp Refills   ARIPiprazole (ABILIFY) 15 MG tablet [Pharmacy Med Name: ARIPIPRAZOLE 15 MG TAB] 90 tablet 1    Sig: TAKE 1 TABLET BY MOUTH ONCE DAILY     Not Delegated - Psychiatry:  Antipsychotics - Second Generation (Atypical) - aripiprazole Failed - 06/09/2021 12:57 PM      Failed - This refill cannot be delegated      Passed - Valid encounter within last 6 months    Recent Outpatient Visits           1 month ago Cerebral palsy, unspecified type [facility])   Anderson, DO   6 months ago No-show for appointment   [facility] Delsa Grana, PA-C   7 months ago Medication monitoring encounter   [facility] Kathrine Haddock, NP   7 months ago [facility] Delsa Grana, PA-C   9 months ago Pure hypercholesterolemia   [facility] Delsa Grana, PA-C       Future Appointments             In 2 months McGowan, Shannon A, [place]   In 8 months  [facility], [facility]

## 2021-06-10 ENCOUNTER — Telehealth: Payer: Self-pay | Admitting: Family Medicine

## 2021-06-10 DIAGNOSIS — N319 Neuromuscular dysfunction of bladder, unspecified: Secondary | ICD-10-CM | POA: Diagnosis not present

## 2021-06-10 DIAGNOSIS — Z435 Encounter for attention to cystostomy: Secondary | ICD-10-CM | POA: Diagnosis not present

## 2021-06-10 DIAGNOSIS — Z466 Encounter for fitting and adjustment of urinary device: Secondary | ICD-10-CM | POA: Diagnosis not present

## 2021-06-10 DIAGNOSIS — G809 Cerebral palsy, unspecified: Secondary | ICD-10-CM | POA: Diagnosis not present

## 2021-06-10 DIAGNOSIS — N39498 Other specified urinary incontinence: Secondary | ICD-10-CM | POA: Diagnosis not present

## 2021-06-10 DIAGNOSIS — F329 Major depressive disorder, single episode, unspecified: Secondary | ICD-10-CM | POA: Diagnosis not present

## 2021-06-10 NOTE — Telephone Encounter (Signed)
Mitzi Hansen RN from [place] called to report that the patient has not had a bowel movement in seven days. She wants to use an enema and to also prescribe him something for long term care. Please advise Best contact: [phone]

## 2021-06-10 NOTE — Telephone Encounter (Signed)
Nurse notified  

## 2021-06-21 ENCOUNTER — Other Ambulatory Visit: Payer: Self-pay | Admitting: Family Medicine

## 2021-06-21 DIAGNOSIS — Z86718 Personal history of other venous thrombosis and embolism: Secondary | ICD-10-CM

## 2021-06-21 NOTE — Telephone Encounter (Signed)
Requested medication (s) are due for refill today: Yes  Requested medication (s) are on the active medication list: yes  Last refill:  05/19/21 #90  Future visit scheduled: no  Notes to clinic:  overdue lab work   Requested Prescriptions  Pending Prescriptions Disp Refills   XARELTO 10 MG TABS tablet [Pharmacy Med Name: XARELTO 10 MG TAB] 90 tablet 0    Sig: TAKE 1 TABLET BY MOUTH ONCE DAILY     Hematology: Anticoagulants - rivaroxaban Failed - 06/21/2021 12:06 PM      Failed - ALT in normal range and within 180 days    ALT  Date Value Ref Range Status  10/22/2020 25 9 - 46 U/L Final   SGPT (ALT)  Date Value Ref Range Status  01/25/2014 24 U/L Final    Comment:    14-63 NOTE: New Reference Range 12/12/13           Failed - AST in normal range and within 180 days    AST  Date Value Ref Range Status  10/22/2020 22 10 - 35 U/L Final   SGOT(AST)  Date Value Ref Range Status  01/25/2014 14 (L) 15 - 37 Unit/L Final          Failed - Cr in normal range and within 360 days    Creat  Date Value Ref Range Status  10/22/2020 0.62 (L) 0.70 - 1.25 mg/dL Final    Comment:    For patients >37 years of age, the reference limit for Creatinine is approximately 13% higher for people identified as African-American. .           Failed - HCT in normal range and within 360 days    HCT  Date Value Ref Range Status  04/10/2020 40.7 38.5 - 50.0 % Final   Hematocrit  Date Value Ref Range Status  10/07/2015 40.9 37.5 - 51.0 % Final          Failed - HGB in normal range and within 360 days    Hemoglobin  Date Value Ref Range Status  04/10/2020 13.4 13.2 - 17.1 g/dL Final  10/07/2015 13.9 12.6 - 17.7 g/dL Final          Failed - PLT in normal range and within 360 days    Platelets  Date Value Ref Range Status  04/10/2020 195 140 - 400 Thousand/uL Final  10/07/2015 180 150 - 379 x10E3/uL Final          Passed - Valid encounter within last 12 months    Recent  Outpatient Visits           1 month ago Cerebral palsy, unspecified type [facility])   Carroll, DO   6 months ago No-show for appointment   [facility] Delsa Grana, PA-C   8 months ago Medication monitoring encounter   [facility] Kathrine Haddock, NP   8 months ago [facility] Delsa Grana, PA-C   10 months ago Pure hypercholesterolemia   [facility] Delsa Grana, PA-C       Future Appointments             In 2 months McGowan, Shannon A, [place]   In 8 months  [facility], [facility]

## 2021-06-24 DIAGNOSIS — Z435 Encounter for attention to cystostomy: Secondary | ICD-10-CM | POA: Diagnosis not present

## 2021-06-24 DIAGNOSIS — Z466 Encounter for fitting and adjustment of urinary device: Secondary | ICD-10-CM | POA: Diagnosis not present

## 2021-06-24 DIAGNOSIS — N319 Neuromuscular dysfunction of bladder, unspecified: Secondary | ICD-10-CM | POA: Diagnosis not present

## 2021-06-24 DIAGNOSIS — G809 Cerebral palsy, unspecified: Secondary | ICD-10-CM | POA: Diagnosis not present

## 2021-06-24 DIAGNOSIS — F329 Major depressive disorder, single episode, unspecified: Secondary | ICD-10-CM | POA: Diagnosis not present

## 2021-06-24 DIAGNOSIS — N39498 Other specified urinary incontinence: Secondary | ICD-10-CM | POA: Diagnosis not present

## 2021-06-27 DIAGNOSIS — N319 Neuromuscular dysfunction of bladder, unspecified: Secondary | ICD-10-CM | POA: Diagnosis not present

## 2021-06-27 DIAGNOSIS — Z7901 Long term (current) use of anticoagulants: Secondary | ICD-10-CM | POA: Diagnosis not present

## 2021-06-27 DIAGNOSIS — Z95828 Presence of other vascular implants and grafts: Secondary | ICD-10-CM | POA: Diagnosis not present

## 2021-06-27 DIAGNOSIS — Z466 Encounter for fitting and adjustment of urinary device: Secondary | ICD-10-CM | POA: Diagnosis not present

## 2021-06-27 DIAGNOSIS — Z86718 Personal history of other venous thrombosis and embolism: Secondary | ICD-10-CM | POA: Diagnosis not present

## 2021-06-27 DIAGNOSIS — Z435 Encounter for attention to cystostomy: Secondary | ICD-10-CM | POA: Diagnosis not present

## 2021-06-27 DIAGNOSIS — K219 Gastro-esophageal reflux disease without esophagitis: Secondary | ICD-10-CM | POA: Diagnosis not present

## 2021-06-27 DIAGNOSIS — G809 Cerebral palsy, unspecified: Secondary | ICD-10-CM | POA: Diagnosis not present

## 2021-06-27 DIAGNOSIS — M2450 Contracture, unspecified joint: Secondary | ICD-10-CM | POA: Diagnosis not present

## 2021-06-27 DIAGNOSIS — E785 Hyperlipidemia, unspecified: Secondary | ICD-10-CM | POA: Diagnosis not present

## 2021-06-27 DIAGNOSIS — F329 Major depressive disorder, single episode, unspecified: Secondary | ICD-10-CM | POA: Diagnosis not present

## 2021-06-27 DIAGNOSIS — N39498 Other specified urinary incontinence: Secondary | ICD-10-CM | POA: Diagnosis not present

## 2021-07-02 DIAGNOSIS — Z20822 Contact with and (suspected) exposure to covid-19: Secondary | ICD-10-CM | POA: Diagnosis not present

## 2021-07-09 DIAGNOSIS — G809 Cerebral palsy, unspecified: Secondary | ICD-10-CM | POA: Diagnosis not present

## 2021-07-09 DIAGNOSIS — Z435 Encounter for attention to cystostomy: Secondary | ICD-10-CM | POA: Diagnosis not present

## 2021-07-09 DIAGNOSIS — F329 Major depressive disorder, single episode, unspecified: Secondary | ICD-10-CM | POA: Diagnosis not present

## 2021-07-09 DIAGNOSIS — Z466 Encounter for fitting and adjustment of urinary device: Secondary | ICD-10-CM | POA: Diagnosis not present

## 2021-07-09 DIAGNOSIS — N39498 Other specified urinary incontinence: Secondary | ICD-10-CM | POA: Diagnosis not present

## 2021-07-09 DIAGNOSIS — N319 Neuromuscular dysfunction of bladder, unspecified: Secondary | ICD-10-CM | POA: Diagnosis not present

## 2021-07-21 DIAGNOSIS — Z435 Encounter for attention to cystostomy: Secondary | ICD-10-CM | POA: Diagnosis not present

## 2021-07-21 DIAGNOSIS — F329 Major depressive disorder, single episode, unspecified: Secondary | ICD-10-CM | POA: Diagnosis not present

## 2021-07-21 DIAGNOSIS — N39498 Other specified urinary incontinence: Secondary | ICD-10-CM | POA: Diagnosis not present

## 2021-07-21 DIAGNOSIS — G809 Cerebral palsy, unspecified: Secondary | ICD-10-CM | POA: Diagnosis not present

## 2021-07-21 DIAGNOSIS — N319 Neuromuscular dysfunction of bladder, unspecified: Secondary | ICD-10-CM | POA: Diagnosis not present

## 2021-07-21 DIAGNOSIS — Z466 Encounter for fitting and adjustment of urinary device: Secondary | ICD-10-CM | POA: Diagnosis not present

## 2021-07-24 DIAGNOSIS — N319 Neuromuscular dysfunction of bladder, unspecified: Secondary | ICD-10-CM | POA: Diagnosis not present

## 2021-07-24 DIAGNOSIS — F329 Major depressive disorder, single episode, unspecified: Secondary | ICD-10-CM | POA: Diagnosis not present

## 2021-07-24 DIAGNOSIS — Z466 Encounter for fitting and adjustment of urinary device: Secondary | ICD-10-CM | POA: Diagnosis not present

## 2021-07-24 DIAGNOSIS — G809 Cerebral palsy, unspecified: Secondary | ICD-10-CM | POA: Diagnosis not present

## 2021-07-24 DIAGNOSIS — Z435 Encounter for attention to cystostomy: Secondary | ICD-10-CM | POA: Diagnosis not present

## 2021-07-24 DIAGNOSIS — N39498 Other specified urinary incontinence: Secondary | ICD-10-CM | POA: Diagnosis not present

## 2021-07-25 ENCOUNTER — Other Ambulatory Visit: Payer: Self-pay | Admitting: Unknown Physician Specialty

## 2021-07-25 DIAGNOSIS — F321 Major depressive disorder, single episode, moderate: Secondary | ICD-10-CM

## 2021-07-25 NOTE — Telephone Encounter (Signed)
Requested Prescriptions  ?Pending Prescriptions Disp Refills  ?? citalopram (CELEXA) 20 MG tablet [Pharmacy Med Name: CITALOPRAM HYDROBROMIDE 20 MG TAB] 90 tablet 3  ?  Sig: TAKE 1 TABLET BY MOUTH ONCE DAILY  ?  ? Psychiatry:  Antidepressants - SSRI Passed - 07/25/2021 12:37 PM  ?  ?  Passed - Completed PHQ-2 or PHQ-9 in the last 360 days  ?  ?  Passed - Valid encounter within last 6 months  ?  Recent Outpatient Visits   ?      ? 2 months ago Cerebral palsy, unspecified type ([place])  ? [facility] Teodora Medici, DO  ? 7 months ago No-show for appointment  ? [facility] [place], Kristeen Miss, [place]  ? 9 months ago Medication monitoring encounter  ? [facility] Kathrine Haddock, NP  ? 9 months ago Anxiety  ? [facility] [place], Kristeen Miss, [place]  ? 11 months ago Pure hypercholesterolemia  ? [facility] Delsa Grana, [place]  ?  ?  ?Future Appointments   ?        ? In 1 month McGowan, Gordan Payment [place]  ? In 3 months Delsa Grana, PA-C [facility], [place]  ? In 6 months  [place]  ?  ? ?  ?  ?  ? ? ?

## 2021-07-27 DIAGNOSIS — G809 Cerebral palsy, unspecified: Secondary | ICD-10-CM | POA: Diagnosis not present

## 2021-07-27 DIAGNOSIS — Z86718 Personal history of other venous thrombosis and embolism: Secondary | ICD-10-CM | POA: Diagnosis not present

## 2021-07-27 DIAGNOSIS — F329 Major depressive disorder, single episode, unspecified: Secondary | ICD-10-CM | POA: Diagnosis not present

## 2021-07-27 DIAGNOSIS — M2450 Contracture, unspecified joint: Secondary | ICD-10-CM | POA: Diagnosis not present

## 2021-07-27 DIAGNOSIS — Z95828 Presence of other vascular implants and grafts: Secondary | ICD-10-CM | POA: Diagnosis not present

## 2021-07-27 DIAGNOSIS — Z435 Encounter for attention to cystostomy: Secondary | ICD-10-CM | POA: Diagnosis not present

## 2021-07-27 DIAGNOSIS — Z7901 Long term (current) use of anticoagulants: Secondary | ICD-10-CM | POA: Diagnosis not present

## 2021-07-27 DIAGNOSIS — N319 Neuromuscular dysfunction of bladder, unspecified: Secondary | ICD-10-CM | POA: Diagnosis not present

## 2021-07-27 DIAGNOSIS — N39498 Other specified urinary incontinence: Secondary | ICD-10-CM | POA: Diagnosis not present

## 2021-07-27 DIAGNOSIS — Z466 Encounter for fitting and adjustment of urinary device: Secondary | ICD-10-CM | POA: Diagnosis not present

## 2021-07-27 DIAGNOSIS — E785 Hyperlipidemia, unspecified: Secondary | ICD-10-CM | POA: Diagnosis not present

## 2021-07-27 DIAGNOSIS — K219 Gastro-esophageal reflux disease without esophagitis: Secondary | ICD-10-CM | POA: Diagnosis not present

## 2021-08-08 DIAGNOSIS — Z435 Encounter for attention to cystostomy: Secondary | ICD-10-CM | POA: Diagnosis not present

## 2021-08-08 DIAGNOSIS — N39498 Other specified urinary incontinence: Secondary | ICD-10-CM | POA: Diagnosis not present

## 2021-08-08 DIAGNOSIS — N319 Neuromuscular dysfunction of bladder, unspecified: Secondary | ICD-10-CM | POA: Diagnosis not present

## 2021-08-08 DIAGNOSIS — G809 Cerebral palsy, unspecified: Secondary | ICD-10-CM | POA: Diagnosis not present

## 2021-08-08 DIAGNOSIS — F329 Major depressive disorder, single episode, unspecified: Secondary | ICD-10-CM | POA: Diagnosis not present

## 2021-08-08 DIAGNOSIS — Z466 Encounter for fitting and adjustment of urinary device: Secondary | ICD-10-CM | POA: Diagnosis not present

## 2021-08-11 ENCOUNTER — Telehealth: Payer: Self-pay | Admitting: Family Medicine

## 2021-08-11 DIAGNOSIS — G809 Cerebral palsy, unspecified: Secondary | ICD-10-CM

## 2021-08-11 NOTE — Telephone Encounter (Signed)
Spoke to [place] pt sister, she states if they can get an order for a ramp due to him being on the wheelchair. Please advice. ?

## 2021-08-11 NOTE — Telephone Encounter (Signed)
Pt sister, [name] [phone] calling to request an order to be sent to CAP worker [name] ?Please fax a letter stating the patient is wheelchair bound and is in need of a ramp to get to his doctors appointments ? ?[name] Fax- [phone] ?Phone- [phone] ? ?

## 2021-08-13 NOTE — Telephone Encounter (Signed)
Did you ever figured it out? We can try to put in a DME order, per his sister Gerald Montgomery will be able to get his ramp as long as you can give an order for a ramp. Also if pt needs an appointment his sister is wondering if it can be virtual appointment? ?

## 2021-08-14 ENCOUNTER — Telehealth: Payer: Self-pay | Admitting: *Deleted

## 2021-08-14 NOTE — Chronic Care Management (AMB) (Signed)
?  Chronic Care Management  ? ?Note ? ?08/14/2021 ?Name: Gerald Montgomery MRN: [id number] DOB: [birth year] ? ?Gerald Montgomery is a 66 y.o. year old male who is a primary care patient of Delsa Grana, [place]. I reached out to [name] by phone today in response to a referral sent by Gerald Montgomery PCP. ? ?Gerald Montgomery was given information about Chronic Care Management services today including:  ?CCM service includes personalized support from designated clinical staff supervised by his physician, including individualized plan of care and coordination with other care providers ?24/7 contact phone numbers for assistance for urgent and routine care needs. ?Service will only be billed when office clinical staff spend 20 minutes or more in a month to coordinate care. ?Only one practitioner may furnish and bill the service in a calendar month. ?The patient may stop CCM services at any time (effective at the end of the month) by phone call to the office staff. ?The patient is responsible for co-pay (up to 20% after annual deductible is met) if co-pay is required by the individual health plan.  ? ?Patient agreed to services and verbal consent obtained.  ? ?Follow up plan: ?Telephone appointment with care management team member scheduled for: 08/22/2021 ? ? , CCMA ?Care Guide, Embedded Care Coordination ?[place]  Care Management  ?Direct Dial: [phone] ? ? ?

## 2021-08-14 NOTE — Telephone Encounter (Signed)
Referral has been placed. 

## 2021-08-20 ENCOUNTER — Other Ambulatory Visit: Payer: Self-pay | Admitting: Family Medicine

## 2021-08-20 DIAGNOSIS — Z86718 Personal history of other venous thrombosis and embolism: Secondary | ICD-10-CM

## 2021-08-20 DIAGNOSIS — F39 Unspecified mood [affective] disorder: Secondary | ICD-10-CM

## 2021-08-22 ENCOUNTER — Ambulatory Visit: Payer: Medicare Other | Admitting: *Deleted

## 2021-08-22 DIAGNOSIS — Z9181 History of falling: Secondary | ICD-10-CM

## 2021-08-22 DIAGNOSIS — F331 Major depressive disorder, recurrent, moderate: Secondary | ICD-10-CM

## 2021-08-22 DIAGNOSIS — G809 Cerebral palsy, unspecified: Secondary | ICD-10-CM

## 2021-08-22 NOTE — Patient Instructions (Signed)
Visit Information ? ?Thank you for taking time to visit with me today. Please don't hesitate to contact me if I can be of assistance to you before our next scheduled telephone appointment. ? ?Following are the goals we discussed today:  ?- begin a notebook of services in my neighborhood or community ?- call 211 when I need some help ?- follow-up on any referrals for help I am given ?- think ahead to make sure my need does not become an emergency ?- have a back-up plan ? ?Our next appointment is by telephone on 09/12/21 at [place] ? ?Please call the care guide team at [phone] if you need to cancel or reschedule your appointment.  ? ?If you are experiencing a Mental Health or [place] or need someone to talk to, please call the Suicide and Crisis Lifeline: 988  ? ?Following is a copy of your full plan of care:  ?Care Plan : General Social Work (Adult)  ?Updates made by Gerald Claude, LCSW since 08/22/2021 12:00 AM  ?  ? ?Problem: CHL AMB "PATIENT-SPECIFIC PROBLEM"   ?Note:   ?CARE PLAN ENTRY ?(see longitudinal plan of care for additional care plan information) ? ?Current Barriers:  ?Patient with  cerebral palsy  in need of assistance with connection to community resources  ?Knowledge deficits and need for support, education and care coordination related to community resources support  ?Inability to perform ADL's independently, Inability to perform IADL's independently, and Lacks knowledge of community resource: wheelchair ramp ? ?Clinical Goal(s)  ?Over the next 90 days, patient will work with CAP services and Vocational Rehab-Independent Living to secure wheelchair ramp ? ?Interventions provided by LCSW:  ?Assessed patient's care coordination needs related to patient's current medical condition(cerebral palsy) and discussed ongoing care management follow up  ?Confirmed that patient now resides with his sister following the death of his mother-patient's sister is patient's full time caregiver and  legal guardian, transportation to medical appointments provided by [employer] ?Confirmed that patient is receiving Community Alternative Program-(CAP) services-CAP worker is [employer] who is currently working on getting wheelchair ramp arranged for patient and additional DME needs ?CAP worker working on arranging in home care on the weekends to assist with patient's care ?Confirmed that company will be coming out today at 11am to take the measurement-patient may still need medical necessity letter from provider ?Provided patient's sister with information about the Independent Living Program  ?Advised patient's  ?Collaborated with appropriate clinical care team members regarding patient needs ?Collaborated with CAP services and Vocational Rehab-left voicemail message requesting a return call regarding wheelchair ramp ?Active listening / Reflection utilized  ?Emotional Support Provided ?Caregiver stress acknowledged   ? ? ?Patient Self Care Activities & Deficits:  ?Patient is unable to independently navigate community resource options without care coordination support  ?Acknowledges deficits and is motivated to resolve concern  ?Unable to perform ADLs independently ?Unable to perform IADLs independently ?Strong family or social support ? ?Initial goal documentation ? ? ?  ? ? ?Gerald Montgomery was given information about Care Management services by the embedded care coordination team including:  ?Care Management services include personalized support from designated clinical staff supervised by his physician, including individualized plan of care and coordination with other care providers ?24/7 contact phone numbers for assistance for urgent and routine care needs. ?The patient may stop CCM services at any time (effective at the end of the month) by phone call to the office staff. ? ?Patient agreed to services and  verbal consent obtained.  ? ?Patient verbalizes understanding of instructions and care plan provided today  and agrees to view in [place]. Active MyChart status confirmed with patient.   ? ?Telephone follow up appointment with care management team member scheduled for: 09/12/21 ? ?Gerald Ogle, LCSW ?Clinical Social Worker  ?[facility] Care Management ?[phone]  ? ?  ?

## 2021-08-22 NOTE — Chronic Care Management (AMB) (Addendum)
Care Management ?Clinical Social Work Note ? ?08/22/2021 ?Name: Gerald Montgomery MRN: [id number] DOB: 14-Feb-1956 ? ?Gerald Montgomery is a 66 y.o. year old male who is a primary care patient of Delsa Grana, [place].  The Care Management team was consulted for assistance with chronic disease management and coordination needs. ? ?Collaboration with patient's Montgomery  for initial visit in response to provider referral for social work chronic care management and care coordination services ? ?Consent to Services:  ?Gerald Montgomery was given information about Care Management services today including:  ?Care Management services includes personalized support from designated clinical staff supervised by Gerald physician, including individualized plan of care and coordination with other care providers ?24/7 contact phone numbers for assistance for urgent and routine care needs. ?The patient may stop case management services at any time by phone call to the office staff. ? ?Patient agreed to services and consent obtained.  ? ?Assessment: Review of patient past medical history, allergies, medications, and health status, including review of relevant consultants reports was performed today as part of a comprehensive evaluation and provision of chronic care management and care coordination services. ? ?SDOH (Social Determinants of Health) assessments and interventions performed:   ? ?Advanced Directives Status: Not addressed in this encounter. ? ?Care Plan ? ?Allergies  ?Allergen Reactions  ? Macrobid [Nitrofurantoin]   ? Penicillins   ?  Sickness/sweating  ? Sulfa Antibiotics Itching  ? ? ?Outpatient Encounter Medications as of 08/22/2021  ?Medication Sig  ? acetaminophen (TYLENOL) 325 MG tablet Take 1 tablet (325 mg total) by mouth every 6 (six) hours as needed for mild pain (or Fever >/= 101).  ? citalopram (CELEXA) 20 MG tablet TAKE 1 TABLET BY MOUTH ONCE DAILY  ? fexofenadine (ALLEGRA) 180 MG tablet Take 180 mg by mouth daily.  ? fluticasone  (FLONASE) 50 MCG/ACT nasal spray Place into both nostrils daily.  ? metroNIDAZOLE (METROGEL) 1 % gel APPLY A SMALL AMOUNT TO AFFECTED AREA TWICE DAILY  ? nystatin cream (MYCOSTATIN) APPLY TOPICALLY 3 TIMES DAILY TO AFFECTED AREA  ? nystatin powder APPLY TOPICALLY 3 TIMES DAILY AS NEEDED to affected skin, follow up in office if not improving  ? oxybutynin (DITROPAN) 5 MG tablet TAKE 1/2 TABLET BY MOUTH TWICE DAILY AS NEEDED BLADDER SPASM  ? pantoprazole (PROTONIX) 40 MG tablet TAKE 1 TABLET BY MOUTH ONCE DAILY  ? rosuvastatin (CRESTOR) 5 MG tablet Take 1 tablet (5 mg total) by mouth daily.  ? tizanidine (ZANAFLEX) 2 MG capsule Take 1 capsule (2 mg total) by mouth 3 (three) times daily.  ? [DISCONTINUED] ARIPiprazole (ABILIFY) 15 MG tablet TAKE 1 TABLET BY MOUTH ONCE DAILY  ? [DISCONTINUED] XARELTO 10 MG TABS tablet TAKE 1 TABLET BY MOUTH ONCE DAILY  ? ?Facility-Administered Encounter Medications as of 08/22/2021  ?Medication  ? lidocaine (XYLOCAINE) 2 % jelly 1 application  ? ? ?Patient Active Problem List  ? Diagnosis Date Noted  ? Current moderate episode of major depressive disorder ([place]) 04/10/2020  ? Suprapubic catheter ([place]) 04/10/2020  ? Hyperlipidemia, unspecified 03/23/2019  ? Iron deficiency anemia 08/24/2017  ? Hypochromic microcytic anemia 08/03/2017  ? GERD (gastroesophageal reflux disease) 08/03/2017  ? Stage 1 decubitus ulcer 08/03/2017  ? Abnormal urine odor 07/16/2016  ? Need for home health care 06/17/2016  ? Hyperglycemia 03/04/2016  ? Screening for lipid disorders 03/04/2016  ? Cerumen impaction 02/12/2016  ? Cerebral palsy ([place]) 12/05/2015  ? History of DVT of lower extremity 04/11/2015  ? Neurogenic bladder 04/11/2015  ?  Monitoring for long-term anticoagulant use 04/11/2015  ? ? ?Conditions to be addressed/monitored:  cerebral palsy ; ADL IADL limitations and Lacks knowledge of community resource: wheelchair ramp ? ?Care Plan : General Social Work (Adult)  ?Updates made by Vern Claude, LCSW  since 08/22/2021 12:00 AM  ?  ? ?Problem: CHL AMB "PATIENT-SPECIFIC PROBLEM"   ?Note:   ?CARE PLAN ENTRY ?(see longitudinal plan of care for additional care plan information) ? ?Current Barriers:  ?Patient with  cerebral palsy  in need of assistance with connection to community resources  ?Knowledge deficits and need for support, education and care coordination related to community resources support  ?Inability to perform ADL's independently, Inability to perform IADL's independently, and Lacks knowledge of community resource: wheelchair ramp ? ?Clinical Goal(s)  ?Over the next 90 days, patient will work with CAP services and [facility] to secure wheelchair ramp ? ?Interventions provided by LCSW:  ?Assessed patient's care coordination needs related to patient's current medical condition(cerebral palsy) and discussed ongoing care management follow up  ?Confirmed that patient now resides with Gerald Montgomery following the death of Gerald mother-patient's Montgomery is patient's full time caregiver and legal guardian, transportation to medical appointments provided by [employer] ?Confirmed that patient is receiving Community Alternative Program-(CAP) services-CAP worker is [employer] who is currently working on getting wheelchair ramp arranged for patient and additional DME needs ?CAP worker working on arranging in home care on the weekends to assist with patient's care ?Confirmed that company will be coming out today at 11am to take the measurement-patient may still need medical necessity letter from provider ?Provided patient's Montgomery with information about the Independent Living Program  ?Advised patient's  ?Collaborated with appropriate clinical care team members regarding patient needs ?Collaborated with CAP services and [facility]-left voicemail message requesting a return call regarding wheelchair ramp ?Active listening / Reflection utilized  ?Emotional Support Provided ?Caregiver stress  acknowledged   ?Update: return call from [facility] confirming that they have received referral for the wheelchair ramp. They have sent patient's Montgomery paper work that needs to be completed before the work can be done. Patient's Montgomery to be notified ? ? ?Patient Self Care Activities & Deficits:  ?Patient is unable to independently navigate community resource options without care coordination support  ?Acknowledges deficits and is motivated to resolve concern  ?Unable to perform ADLs independently ?Unable to perform IADLs independently ?Strong family or social support ? ?Initial goal documentation ? ? ?  ?  ? ?Follow Up Plan: SW will follow up with patient by phone over the next 30 business days ? ?Ramirez Fullbright, LCSW ?Clinical Social Worker  ?[facility] Care Management ?[phone] ? ?

## 2021-08-26 DIAGNOSIS — N39498 Other specified urinary incontinence: Secondary | ICD-10-CM | POA: Diagnosis not present

## 2021-08-26 DIAGNOSIS — Z95828 Presence of other vascular implants and grafts: Secondary | ICD-10-CM | POA: Diagnosis not present

## 2021-08-26 DIAGNOSIS — Z435 Encounter for attention to cystostomy: Secondary | ICD-10-CM | POA: Diagnosis not present

## 2021-08-26 DIAGNOSIS — F329 Major depressive disorder, single episode, unspecified: Secondary | ICD-10-CM | POA: Diagnosis not present

## 2021-08-26 DIAGNOSIS — Z466 Encounter for fitting and adjustment of urinary device: Secondary | ICD-10-CM | POA: Diagnosis not present

## 2021-08-26 DIAGNOSIS — M2450 Contracture, unspecified joint: Secondary | ICD-10-CM | POA: Diagnosis not present

## 2021-08-26 DIAGNOSIS — Z86718 Personal history of other venous thrombosis and embolism: Secondary | ICD-10-CM | POA: Diagnosis not present

## 2021-08-26 DIAGNOSIS — K219 Gastro-esophageal reflux disease without esophagitis: Secondary | ICD-10-CM | POA: Diagnosis not present

## 2021-08-26 DIAGNOSIS — E785 Hyperlipidemia, unspecified: Secondary | ICD-10-CM | POA: Diagnosis not present

## 2021-08-26 DIAGNOSIS — Z7901 Long term (current) use of anticoagulants: Secondary | ICD-10-CM | POA: Diagnosis not present

## 2021-08-26 DIAGNOSIS — G809 Cerebral palsy, unspecified: Secondary | ICD-10-CM | POA: Diagnosis not present

## 2021-08-26 DIAGNOSIS — N319 Neuromuscular dysfunction of bladder, unspecified: Secondary | ICD-10-CM | POA: Diagnosis not present

## 2021-09-01 DIAGNOSIS — N319 Neuromuscular dysfunction of bladder, unspecified: Secondary | ICD-10-CM | POA: Diagnosis not present

## 2021-09-01 DIAGNOSIS — G809 Cerebral palsy, unspecified: Secondary | ICD-10-CM | POA: Diagnosis not present

## 2021-09-01 DIAGNOSIS — Z466 Encounter for fitting and adjustment of urinary device: Secondary | ICD-10-CM | POA: Diagnosis not present

## 2021-09-01 DIAGNOSIS — F329 Major depressive disorder, single episode, unspecified: Secondary | ICD-10-CM | POA: Diagnosis not present

## 2021-09-01 DIAGNOSIS — Z435 Encounter for attention to cystostomy: Secondary | ICD-10-CM | POA: Diagnosis not present

## 2021-09-01 DIAGNOSIS — N39498 Other specified urinary incontinence: Secondary | ICD-10-CM | POA: Diagnosis not present

## 2021-09-03 NOTE — Progress Notes (Signed)
Virtual Visit via Telephone Note ? ?I connected with [name] on 09/03/21 at 11:00 AM EDT by telephone and verified that I am speaking with the correct person using two identifiers. ? ?Location: ?Patient: Home  ?Provider: Office ?  ?I discussed the limitations, risks, security and privacy concerns of performing an evaluation and management service by telephone and the availability of in person appointments. I also discussed with the patient that there may be a patient responsible charge related to this service. The patient expressed understanding and agreed to proceed. ? ? ?History of Present Illness: ?Urological history ?1. Neurogenic bladder ?-managed with a SPT ?  ?2. High risk hematuria ?-non-smoker ?-CTU 2017 bilateral renal cysts and bladder diverticuli ?-cysto 2017 NED ?-cysto 2020 NED  ?  ?Observations/Objective: ?He is answering questions appropriately and his sister is present as well.  She provides most of the history.   ? ?She states that he is having his SPT exchanged monthly by Home Health.  He only has gross heme with exchanges.  He has not had any issues with UTI's.   ? ?Patient denies any modifying or aggravating factors.  Patient denies any gross hematuria, dysuria or suprapubic/flank pain.  Patient denies any fevers, chills, nausea or vomiting.   ? ?He is taking the oxybutynin IR BID for his bladder spasms and it is working well.  ? ?Assessment and Plan: ?1. Neurogenic bladder ?-refill sent for oxybutynin IR 5 mg BID ? ?2. High risk hematuria ?-instructed to notify us if he should experience persistent hematuria ? ?Follow Up Instructions: ? ?Continue monthly SPT exchanges.  Notify us if hematuria persists after exchanges.   ? ?Refill is also given for nystatin powder as they will not see their PCP until June.  ? ?I discussed the assessment and treatment plan with the patient. The patient was provided an opportunity to ask questions and all were answered. The patient agreed with the plan and  demonstrated an understanding of the instructions. ?  ?The patient was advised to call back or seek an in-person evaluation if the symptoms worsen or if the condition fails to improve as anticipated. ? ?I provided 20 minutes of non-face-to-face time during this encounter. ? ? ?Letzy Gullickson, PA-C  ?

## 2021-09-04 ENCOUNTER — Telehealth (INDEPENDENT_AMBULATORY_CARE_PROVIDER_SITE_OTHER): Payer: Medicare Other | Admitting: Urology

## 2021-09-04 ENCOUNTER — Encounter: Payer: Self-pay | Admitting: Urology

## 2021-09-04 DIAGNOSIS — B372 Candidiasis of skin and nail: Secondary | ICD-10-CM | POA: Diagnosis not present

## 2021-09-04 DIAGNOSIS — N319 Neuromuscular dysfunction of bladder, unspecified: Secondary | ICD-10-CM

## 2021-09-04 DIAGNOSIS — Z20822 Contact with and (suspected) exposure to covid-19: Secondary | ICD-10-CM | POA: Diagnosis not present

## 2021-09-04 MED ORDER — OXYBUTYNIN CHLORIDE 5 MG PO TABS: 5.0000 mg | ORAL_TABLET | Freq: Two times a day (BID) | ORAL | 3 refills | Status: AC

## 2021-09-04 MED ORDER — NYSTATIN 100000 UNIT/GM EX CREA: TOPICAL_CREAM | CUTANEOUS | 1 refills | Status: DC

## 2021-09-11 DIAGNOSIS — Z20822 Contact with and (suspected) exposure to covid-19: Secondary | ICD-10-CM | POA: Diagnosis not present

## 2021-09-12 ENCOUNTER — Ambulatory Visit (INDEPENDENT_AMBULATORY_CARE_PROVIDER_SITE_OTHER): Payer: Medicare Other | Admitting: *Deleted

## 2021-09-12 DIAGNOSIS — Z9181 History of falling: Secondary | ICD-10-CM

## 2021-09-12 DIAGNOSIS — G809 Cerebral palsy, unspecified: Secondary | ICD-10-CM

## 2021-09-12 DIAGNOSIS — F331 Major depressive disorder, recurrent, moderate: Secondary | ICD-10-CM

## 2021-09-12 NOTE — Chronic Care Management (AMB) (Signed)
Care Management ?Clinical Social Work Note ? ?09/12/2021 ?Name: Gerald Montgomery MRN: [id number] DOB: 10-10-1955 ? ?Gerald Montgomery is a 66 y.o. year old male who is a primary care patient of Delsa Grana, [place].  The Care Management team was consulted for assistance with chronic disease management and coordination needs. ? ?Collaboration with patient's sister and voacational rehab  for follow up visit in response to provider referral for social work chronic care management and care coordination services ? ?Consent to Services:  ?Gerald Montgomery was given information about Care Management services today including:  ?Care Management services includes personalized support from designated clinical staff supervised by his physician, including individualized plan of care and coordination with other care providers ?24/7 contact phone numbers for assistance for urgent and routine care needs. ?The patient may stop case management services at any time by phone call to the office staff. ? ?Patient agreed to services and consent obtained.  ? ?Assessment: Review of patient past medical history, allergies, medications, and health status, including review of relevant consultants reports was performed today as part of a comprehensive evaluation and provision of chronic care management and care coordination services. ? ?SDOH (Social Determinants of Health) assessments and interventions performed:   ? ?Advanced Directives Status: Not addressed in this encounter. ? ?Care Plan ? ?Allergies  ?Allergen Reactions  ? Macrobid [Nitrofurantoin]   ? Penicillins   ?  Sickness/sweating  ? Sulfa Antibiotics Itching  ? ? ?Outpatient Encounter Medications as of 09/12/2021  ?Medication Sig  ? acetaminophen (TYLENOL) 325 MG tablet Take 1 tablet (325 mg total) by mouth every 6 (six) hours as needed for mild pain (or Fever >/= 101).  ? ARIPiprazole (ABILIFY) 15 MG tablet TAKE 1 TABLET BY MOUTH ONCE DAILY  ? citalopram (CELEXA) 20 MG tablet TAKE 1 TABLET BY MOUTH  ONCE DAILY  ? fluticasone (FLONASE) 50 MCG/ACT nasal spray Place into both nostrils daily.  ? nystatin cream (MYCOSTATIN) APPLY TOPICALLY 3 TIMES DAILY TO AFFECTED AREA  ? oxybutynin (DITROPAN) 5 MG tablet Take 1 tablet (5 mg total) by mouth 2 (two) times daily.  ? pantoprazole (PROTONIX) 40 MG tablet TAKE 1 TABLET BY MOUTH ONCE DAILY  ? rosuvastatin (CRESTOR) 5 MG tablet Take 1 tablet (5 mg total) by mouth daily.  ? tizanidine (ZANAFLEX) 2 MG capsule Take 1 capsule (2 mg total) by mouth 3 (three) times daily.  ? XARELTO 10 MG TABS tablet TAKE 1 TABLET BY MOUTH ONCE DAILY  ? ?Facility-Administered Encounter Medications as of 09/12/2021  ?Medication  ? lidocaine (XYLOCAINE) 2 % jelly 1 application  ? ? ?Patient Active Problem List  ? Diagnosis Date Noted  ? Current moderate episode of major depressive disorder ([place]) 04/10/2020  ? Suprapubic catheter ([place]) 04/10/2020  ? Hyperlipidemia, unspecified 03/23/2019  ? Iron deficiency anemia 08/24/2017  ? Hypochromic microcytic anemia 08/03/2017  ? GERD (gastroesophageal reflux disease) 08/03/2017  ? Stage 1 decubitus ulcer 08/03/2017  ? Abnormal urine odor 07/16/2016  ? Need for home health care 06/17/2016  ? Hyperglycemia 03/04/2016  ? Screening for lipid disorders 03/04/2016  ? Cerumen impaction 02/12/2016  ? Cerebral palsy ([place]) 12/05/2015  ? History of DVT of lower extremity 04/11/2015  ? Neurogenic bladder 04/11/2015  ? Monitoring for long-term anticoagulant use 04/11/2015  ? ? ?Conditions to be addressed/monitored: ;  ?cerebral palsy ; ADL IADL limitations and Lacks knowledge of community resource: wheelchair ramp ?Care Plan : General Social Work (Adult)  ?Updates made by Vern Claude, LCSW since 09/12/2021  12:00 AM  ?  ? ?Problem: CHL AMB "PATIENT-SPECIFIC PROBLEM"   ?Note:   ?CARE PLAN ENTRY ?(see longitudinal plan of care for additional care plan information) ? ?Current Barriers:  ?Patient with  cerebral palsy  in need of assistance with connection to community  resources  ?Knowledge deficits and need for support, education and care coordination related to community resources support  ?Inability to perform ADL's independently, Inability to perform IADL's independently, and Lacks knowledge of community resource: wheelchair ramp ? ?Clinical Goal(s)  ?Over the next 90 days, patient will work with CAP services and [facility]dependent [facility] to secure wheelchair ramp ? ?Interventions provided by LCSW:  ?Assessed patient's care coordination needs related to patient's current medical condition(cerebral palsy) and discussed ongoing care management follow up  ?Confirmed that patient now resides with his sister following the death of his mother-patient's sister is patient's full time caregiver and legal guardian, transportation to medical appointments provided by [employer] ?Confirmed that patient is receiving Community Alternative Program-(CAP) services-CAP worker is [employer] who is currently working on getting wheelchair ramp arranged for patient and additional DME needs ?CAP worker working on arranging in home care on the weekends to assist with patient's care ?Confirmed that company will be coming out today at 11am to take the measurement-patient may still need medical necessity letter from provider ?Provided patient's sister with information about the Independent Living Program  ?Advised patient's  ?Collaborated with appropriate clinical care team members regarding patient needs ?Collaborated with CAP services and [facility]-left voicemail message requesting a return call regarding wheelchair ramp ?Active listening / Reflection utilized  ?Emotional Support Provided ?Caregiver stress acknowledged   ?Update: return call from [facility] confirming that they have received referral for the wheelchair ramp. They have sent patient's sister paper work that needs to be completed before the work can be done-patient's sister notified ?09/12/21  Update Patient's caregiver/sister Gerald Montgomery confirms that she has not received the paperwork from [facility] regarding the wheelchair ramp. Collaboration phone call to [facility] who confirms that paperwork has been sent but will re-send. Address confirmed-case manager coordinating wheelchair ramp is -[employer] [phone] assistant Bethena Roys [phone] assistant Sharyn Lull [phone] ? ? ?Patient Self Care Activities & Deficits:  ?Patient is unable to independently navigate community resource options without care coordination support  ?Acknowledges deficits and is motivated to resolve concern  ?Unable to perform ADLs independently ?Unable to perform IADLs independently ?Strong family or social support ? ?Please see past updates related to this goal by clicking on the "Past Updates" button in the selected goal  ? ? ?  ?  ? ?Follow Up Plan: SW will follow up with patient by phone over the next 7-14 buisness days ? ?Bricyn Labrada, LCSW ?Clinical Social Worker  ?[facility] ?[phone] ? ?

## 2021-09-12 NOTE — Patient Instructions (Signed)
Visit Information ? ?Thank you for taking time to visit with me today. Please don't hesitate to contact me if I can be of assistance to you before our next scheduled telephone appointment. ? ?Following are the goals we discussed today:  ?- begin a notebook of services in my neighborhood or community ?- call 211 when I need some help ?- follow-up on any referrals for help I am given ?- think ahead to make sure my need does not become an emergency ?- have a back-up plan  ?-follow up with [facility] regarding paperwork for wheelchair ramp-contact-Shaniece Ilda Foil [phone] assistant Bethena Roys [phone] assistant Sharyn Lull [phone] ? ? ?Our next appointment is by telephone on 09/22/21 at 1pm ? ?Please call the care guide team at [phone] if you need to cancel or reschedule your appointment.  ? ?If you are experiencing a Mental Health or [place] or need someone to talk to, please call the Suicide and Crisis Lifeline: 988  ? ?Patient verbalizes understanding of instructions and care plan provided today and agrees to view in [place]. Active MyChart status confirmed with patient.   ? ?Telephone follow up appointment with care management team member scheduled for: 09/22/21 ? ?Jaiden Dinkins, LCSW ?Clinical Social Worker  ?[facility] Care [facility] ?[phone] ? ?

## 2021-09-21 DIAGNOSIS — F331 Major depressive disorder, recurrent, moderate: Secondary | ICD-10-CM

## 2021-09-22 ENCOUNTER — Other Ambulatory Visit: Payer: Self-pay | Admitting: Family Medicine

## 2021-09-22 ENCOUNTER — Ambulatory Visit: Payer: Medicare Other | Admitting: *Deleted

## 2021-09-22 DIAGNOSIS — F331 Major depressive disorder, recurrent, moderate: Secondary | ICD-10-CM

## 2021-09-22 DIAGNOSIS — F39 Unspecified mood [affective] disorder: Secondary | ICD-10-CM

## 2021-09-22 DIAGNOSIS — G809 Cerebral palsy, unspecified: Secondary | ICD-10-CM | POA: Diagnosis not present

## 2021-09-22 DIAGNOSIS — Z86718 Personal history of other venous thrombosis and embolism: Secondary | ICD-10-CM

## 2021-09-22 DIAGNOSIS — Z435 Encounter for attention to cystostomy: Secondary | ICD-10-CM | POA: Diagnosis not present

## 2021-09-22 DIAGNOSIS — F329 Major depressive disorder, single episode, unspecified: Secondary | ICD-10-CM | POA: Diagnosis not present

## 2021-09-22 DIAGNOSIS — N319 Neuromuscular dysfunction of bladder, unspecified: Secondary | ICD-10-CM | POA: Diagnosis not present

## 2021-09-22 DIAGNOSIS — Z9181 History of falling: Secondary | ICD-10-CM

## 2021-09-22 DIAGNOSIS — Z466 Encounter for fitting and adjustment of urinary device: Secondary | ICD-10-CM | POA: Diagnosis not present

## 2021-09-22 DIAGNOSIS — N39498 Other specified urinary incontinence: Secondary | ICD-10-CM | POA: Diagnosis not present

## 2021-09-22 NOTE — Chronic Care Management (AMB) (Signed)
Care Management ?Clinical Social Work Note ? ?09/22/2021 ?Name: Gerald Montgomery MRN: [id number] DOB: 1955-11-05 ? ?Gerald Montgomery is a 66 y.o. year old male who is a primary care patient of Gerald Montgomery, [place].  The Care Management team was consulted for assistance with chronic disease management and coordination needs. ? ?Collaboration with patient's sister/caregiver  for follow up visit in response to provider referral for social work chronic care management and care coordination services ? ?Consent to Services:  ?Gerald Montgomery was given information about Care Management services today including:  ?Care Management services includes personalized support from designated clinical staff supervised by his physician, including individualized plan of care and coordination with other care providers ?24/7 contact phone numbers for assistance for urgent and routine care needs. ?The patient may stop case management services at any time by phone call to the office staff. ? ?Patient agreed to services and consent obtained.  ? ?Assessment: Review of patient past medical history, allergies, medications, and health status, including review of relevant consultants reports was performed today as part of a comprehensive evaluation and provision of chronic care management and care coordination services. ? ?SDOH (Social Determinants of Health) assessments and interventions performed:   ? ?Advanced Directives Status: Not addressed in this encounter. ? ?Care Plan ? ?Allergies  ?Allergen Reactions  ? Macrobid [Nitrofurantoin]   ? Penicillins   ?  Sickness/sweating  ? Sulfa Antibiotics Itching  ? ? ?Outpatient Encounter Medications as of 09/22/2021  ?Medication Sig  ? acetaminophen (TYLENOL) 325 MG tablet Take 1 tablet (325 mg total) by mouth every 6 (six) hours as needed for mild pain (or Fever >/= 101).  ? ARIPiprazole (ABILIFY) 15 MG tablet TAKE 1 TABLET BY MOUTH ONCE DAILY  ? citalopram (CELEXA) 20 MG tablet TAKE 1 TABLET BY MOUTH ONCE DAILY  ?  fluticasone (FLONASE) 50 MCG/ACT nasal spray Place into both nostrils daily.  ? nystatin cream (MYCOSTATIN) APPLY TOPICALLY 3 TIMES DAILY TO AFFECTED AREA  ? oxybutynin (DITROPAN) 5 MG tablet Take 1 tablet (5 mg total) by mouth 2 (two) times daily.  ? pantoprazole (PROTONIX) 40 MG tablet TAKE 1 TABLET BY MOUTH ONCE DAILY  ? rosuvastatin (CRESTOR) 5 MG tablet Take 1 tablet (5 mg total) by mouth daily.  ? tizanidine (ZANAFLEX) 2 MG capsule Take 1 capsule (2 mg total) by mouth 3 (three) times daily.  ? XARELTO 10 MG TABS tablet TAKE 1 TABLET BY MOUTH ONCE DAILY  ? ?Facility-Administered Encounter Medications as of 09/22/2021  ?Medication  ? lidocaine (XYLOCAINE) 2 % jelly 1 application  ? ? ?Patient Active Problem List  ? Diagnosis Date Noted  ? Current moderate episode of major depressive disorder ([place]) 04/10/2020  ? Suprapubic catheter ([place]) 04/10/2020  ? Hyperlipidemia, unspecified 03/23/2019  ? Iron deficiency anemia 08/24/2017  ? Hypochromic microcytic anemia 08/03/2017  ? GERD (gastroesophageal reflux disease) 08/03/2017  ? Stage 1 decubitus ulcer 08/03/2017  ? Abnormal urine odor 07/16/2016  ? Need for home health care 06/17/2016  ? Hyperglycemia 03/04/2016  ? Screening for lipid disorders 03/04/2016  ? Cerumen impaction 02/12/2016  ? Cerebral palsy ([place]) 12/05/2015  ? History of DVT of lower extremity 04/11/2015  ? Neurogenic bladder 04/11/2015  ? Monitoring for long-term anticoagulant use 04/11/2015  ? ? ?Conditions to be addressed/monitored:  ?cerebral palsy ; ADL IADL limitations and Lacks knowledge of community resource: wheelchair ramp ?Care Plan : General Social Work (Adult)  ?Updates made by Gerald Claude, LCSW since 09/22/2021 12:00 AM  ?  ? ?  Problem: CHL AMB "PATIENT-SPECIFIC PROBLEM"   ?Note:   ?CARE PLAN ENTRY ?(see longitudinal plan of care for additional care plan information) ? ?Current Barriers:  ?Patient with  cerebral palsy  in need of assistance with connection to community resources   ?Knowledge deficits and need for support, education and care coordination related to community resources support  ?Inability to perform ADL's independently, Inability to perform IADL's independently, and Lacks knowledge of community resource: wheelchair ramp ? ?Clinical Goal(s)  ?Over the next 90 days, patient will work with CAP services and [facility] to secure wheelchair ramp ? ?Interventions provided by LCSW:  ?Assessed patient's care coordination needs related to patient's current medical condition(cerebral palsy) and discussed ongoing care management follow up  ?Confirmed that patient now resides with his sister following the death of his mother-patient's sister is patient's full time caregiver and legal guardian, transportation to medical appointments provided by [employer] ?Confirmed that patient is receiving Community Alternative Program-(CAP) services-CAP worker is Gerald Montgomery who is currently working on getting wheelchair ramp arranged for patient and additional DME needs ?CAP worker working on arranging in home care on the weekends to assist with patient's care ?Confirmed that company will be coming out today at 11am to take the measurement-patient may still need medical necessity letter from provider ?Provided patient's sister with information about the Independent Living Program  ?Advised patient's  ?Collaborated with appropriate clinical care team members regarding patient needs ?Collaborated with CAP services and [facility]-left voicemail message requesting a return call regarding wheelchair ramp ?Active listening / Reflection utilized  ?Emotional Support Provided ?Caregiver stress acknowledged   ?Update: return call from Gerald Montgomery confirming that they have received referral for the wheelchair ramp. They have sent patient's sister paper work that needs to be completed before the work can be done-patient's sister notified ?09/12/21 Update  Patient's caregiver/sister Gerald Montgomery confirms that she has not received the paperwork from [facility] regarding the wheelchair ramp. Collaboration phone call to [facility] who confirms that paperwork has been sent but will re-send. Address confirmed-case manager coordinating wheelchair ramp is -Ella Bodo [phone] assistant Bethena Roys [phone] assistant Sharyn Lull [phone] ?09/22/21 Update Patient's caregiver confirmed that she has received the paperwork from [facility] regarding the wheelchair ramp. She has completed and returned the paperwork to begin the process to build ramp. No additional needs at this time, patient doing well, currently being seen by nurse. This Education officer, museum will follow up over the next 30 days. ? ? ?Patient Self Care Activities & Deficits:  ?Patient is unable to independently navigate community resource options without care coordination support  ?Acknowledges deficits and is motivated to resolve concern  ?Unable to perform ADLs independently ?Unable to perform IADLs independently ?Strong family or social support ? ?Please see past updates related to this goal by clicking on the "Past Updates" button in the selected goal  ? ? ?  ?  ? ?Follow Up Plan: SW will follow up with patient by phone over the next 30 business days ? ?Keithen Capo, LCSW ?Clinical Social Worker  ?[facility] ?[phone] ? ?

## 2021-09-22 NOTE — Patient Instructions (Addendum)
Visit Information ? ?Thank you for taking time to visit with me today. Please don't hesitate to contact me if I can be of assistance to you before our next scheduled telephone appointment. ? ?Following are the goals we discussed today:  ? ?- begin a notebook of services in my neighborhood or community ?- call 211 when I need some help ?- follow-up on any referrals for help I am given ?- think ahead to make sure my need does not become an emergency ?- have a back-up plan  ?-follow up with [facility] regarding paperwork for wheelchair ramp-contact-Shaniece Ilda Foil [phone] assistant Bethena Roys [phone] assistant Sharyn Lull [phone]-confirmed that paperwork was completed and returned ? ?Our next appointment is by telephone on 10/24/21 at 11am ? ?Please call the care guide team at [phone] if you need to cancel or reschedule your appointment.  ? ?If you are experiencing a Mental Health or [place] or need someone to talk to, please call the Suicide and Crisis Lifeline: 988  ? ?Patient verbalizes understanding of instructions and care plan provided today and agrees to view in [place]. Active MyChart status confirmed with patient.   ? ?Telephone follow up appointment with care management team member scheduled for: 10/24/21 ? ?Janann Boeve, LCSW ?Clinical Social Worker  ?[facility] Care Management ?[phone] ?

## 2021-09-25 DIAGNOSIS — F329 Major depressive disorder, single episode, unspecified: Secondary | ICD-10-CM | POA: Diagnosis not present

## 2021-09-25 DIAGNOSIS — Z7901 Long term (current) use of anticoagulants: Secondary | ICD-10-CM | POA: Diagnosis not present

## 2021-09-25 DIAGNOSIS — N319 Neuromuscular dysfunction of bladder, unspecified: Secondary | ICD-10-CM | POA: Diagnosis not present

## 2021-09-25 DIAGNOSIS — N39498 Other specified urinary incontinence: Secondary | ICD-10-CM | POA: Diagnosis not present

## 2021-09-25 DIAGNOSIS — K219 Gastro-esophageal reflux disease without esophagitis: Secondary | ICD-10-CM | POA: Diagnosis not present

## 2021-09-25 DIAGNOSIS — M24522 Contracture, left elbow: Secondary | ICD-10-CM | POA: Diagnosis not present

## 2021-09-25 DIAGNOSIS — M24542 Contracture, left hand: Secondary | ICD-10-CM | POA: Diagnosis not present

## 2021-09-25 DIAGNOSIS — Z86718 Personal history of other venous thrombosis and embolism: Secondary | ICD-10-CM | POA: Diagnosis not present

## 2021-09-25 DIAGNOSIS — Z95828 Presence of other vascular implants and grafts: Secondary | ICD-10-CM | POA: Diagnosis not present

## 2021-09-25 DIAGNOSIS — Z466 Encounter for fitting and adjustment of urinary device: Secondary | ICD-10-CM | POA: Diagnosis not present

## 2021-09-25 DIAGNOSIS — E785 Hyperlipidemia, unspecified: Secondary | ICD-10-CM | POA: Diagnosis not present

## 2021-09-25 DIAGNOSIS — G809 Cerebral palsy, unspecified: Secondary | ICD-10-CM | POA: Diagnosis not present

## 2021-09-25 DIAGNOSIS — Z435 Encounter for attention to cystostomy: Secondary | ICD-10-CM | POA: Diagnosis not present

## 2021-09-30 DIAGNOSIS — Z20822 Contact with and (suspected) exposure to covid-19: Secondary | ICD-10-CM | POA: Diagnosis not present

## 2021-10-03 ENCOUNTER — Ambulatory Visit: Payer: Self-pay

## 2021-10-03 NOTE — Telephone Encounter (Signed)
He needs an appt per leisa ?

## 2021-10-03 NOTE — Telephone Encounter (Signed)
?  Chief Complaint: medication advice ?Symptoms: heartburn, gassiness, constipation ?Frequency: ongoing ?Pertinent Negatives: NA ?Disposition: '[]'$ ED /'[]'$ Urgent Care (no appt availability in office) / '[]'$ Appointment(In office/virtual)/ '[]'$  [place] [facility]/ '[]'$ Home Care/ '[]'$ Refused Recommended Disposition /'[]'$ [place] [facility]/ '[x]'$  Follow-up with PCP ?Additional Notes: spoke with [name], pt's sister. She is requesting either refill and prescribed BID for pantoprazole or switching to something different that will help with acid reflux and gassiness. She states that pt has heartburn a lot so she has been giving the medication morning and evening some days but pt is having bowel problems and not having regular BM so she had to give him an enema and that helped with BM and abdominal distention. I advised her I would send this to PCP and have someone f/up with her.  ? ?Summary: Asking for change in medication due to the issues patient is having  ? Patient sister [name] called in needing a change with the pantoprazole (PROTONIX) 40 MG tablet the way patient take this medication say that he does not have regular bowel movements and gets very gassy so she have been giving him two a day one in the morning and one in the evening. Asking for a new Rx to be sent to the pharmacy today please since he is out of the medication and its not yet due for refill.  [place], [place]. Phone: [phone] Fax: [phone]     Please call Ms Minette Brine at Ph# [phone]   ?  ? ?Reason for Disposition ? [1] Caller has NON-URGENT medicine question about med that PCP prescribed AND [2] triager unable to answer question ? ?Answer Assessment - Initial Assessment Questions ?1. NAME of MEDICATION: "What medicine are you calling about?" ?    pantoprazole ?2. QUESTION: "What is your question?" (e.g., double dose of medicine, side effect) ?    Switching to something different or refill ?3. PRESCRIBING HCP:  "Who prescribed it?" Reason: if prescribed by specialist, call should be referred to that group. ?    [name], PA ?4. SYMPTOMS: "Do you have any symptoms?" ?    Heartburn, gassiness, constipation ? ?Protocols used: Medication Question Call-A-AH ? ?

## 2021-10-06 ENCOUNTER — Telehealth (INDEPENDENT_AMBULATORY_CARE_PROVIDER_SITE_OTHER): Payer: Medicare Other | Admitting: Family Medicine

## 2021-10-06 DIAGNOSIS — Z86718 Personal history of other venous thrombosis and embolism: Secondary | ICD-10-CM | POA: Diagnosis not present

## 2021-10-06 DIAGNOSIS — R109 Unspecified abdominal pain: Secondary | ICD-10-CM

## 2021-10-06 DIAGNOSIS — Z9359 Other cystostomy status: Secondary | ICD-10-CM | POA: Diagnosis not present

## 2021-10-06 DIAGNOSIS — K219 Gastro-esophageal reflux disease without esophagitis: Secondary | ICD-10-CM

## 2021-10-06 DIAGNOSIS — Z5982 Transportation insecurity: Secondary | ICD-10-CM

## 2021-10-06 DIAGNOSIS — E785 Hyperlipidemia, unspecified: Secondary | ICD-10-CM

## 2021-10-06 DIAGNOSIS — K5900 Constipation, unspecified: Secondary | ICD-10-CM

## 2021-10-06 DIAGNOSIS — G809 Cerebral palsy, unspecified: Secondary | ICD-10-CM | POA: Diagnosis not present

## 2021-10-06 NOTE — Progress Notes (Signed)
Name: Gerald Montgomery   MRN: [id number]    DOB: Jul 30, 1955   Date:10/06/2021       Progress Note  Subjective:    Chief Complaint  Chief Complaint  Patient presents with   Medication Change    GERD. On Protonix, tried OTC Nexium w/no help.    I connected with  [name]  on 10/06/21 at  2:40 PM EDT by a telephone encounter and verified that I am speaking with the correct person using two identifiers.  I discussed the limitations of evaluation and management by telemedicine and the availability of in person appointments. The patient expressed understanding and agreed to proceed. Staff also discussed with the patient that there may be a patient responsible charge related to this service. Patient Location: home- did not talk to pt, talked with sister Provider Location: [facility] Additional Individuals present: sister [name]  HPI Abdominal pain recurrent to right upper abd, usually occurs in the morning and sometimes to throat. She reports that she watches him all night sometimes on a video monitor he never appears to have any distress or discomfort but every morning he has different pain complaints He does not complain throughout the day about reflux he has not had any observed indigestion nausea vomiting or decrease or change in appetite She has noticed that she has to turn him and sometimes help him pass gas he seems to have intermittent bloating he is having normal bowel movements  Patient Active Problem List   Diagnosis Date Noted   Current moderate episode of major depressive disorder ([place]) 04/10/2020   Suprapubic catheter ([place]) 04/10/2020   Hyperlipidemia, unspecified 03/23/2019   Iron deficiency anemia 08/24/2017   Hypochromic microcytic anemia 08/03/2017   GERD (gastroesophageal reflux disease) 08/03/2017   Stage 1 decubitus ulcer 08/03/2017   Abnormal urine odor 07/16/2016   Need for home health care 06/17/2016   Hyperglycemia 03/04/2016   Screening for lipid  disorders 03/04/2016   Cerumen impaction 02/12/2016   Cerebral palsy ([place]) 12/05/2015   History of DVT of lower extremity 04/11/2015   Neurogenic bladder 04/11/2015   Monitoring for long-term anticoagulant use 04/11/2015    Social History   Tobacco Use   Smoking status: Never    Passive exposure: Never   Smokeless tobacco: Never  Substance Use Topics   Alcohol use: No    Alcohol/week: 0.0 standard drinks     Current Outpatient Medications:    acetaminophen (TYLENOL) 325 MG tablet, Take 1 tablet (325 mg total) by mouth every 6 (six) hours as needed for mild pain (or Fever >/= 101)., Disp: , Rfl:    ARIPiprazole (ABILIFY) 15 MG tablet, TAKE 1 TABLET BY MOUTH ONCE DAILY, Disp: 90 tablet, Rfl: 0   citalopram (CELEXA) 20 MG tablet, TAKE 1 TABLET BY MOUTH ONCE DAILY, Disp: 90 tablet, Rfl: 0   fluticasone (FLONASE) 50 MCG/ACT nasal spray, Place into both nostrils daily., Disp: , Rfl:    nystatin cream (MYCOSTATIN), APPLY TOPICALLY 3 TIMES DAILY TO AFFECTED AREA, Disp: 30 g, Rfl: 1   oxybutynin (DITROPAN) 5 MG tablet, Take 1 tablet (5 mg total) by mouth 2 (two) times daily., Disp: 180 tablet, Rfl: 3   pantoprazole (PROTONIX) 40 MG tablet, TAKE 1 TABLET BY MOUTH ONCE DAILY, Disp: 90 tablet, Rfl: 1   rosuvastatin (CRESTOR) 5 MG tablet, Take 1 tablet (5 mg total) by mouth daily., Disp: 90 tablet, Rfl: 3   tizanidine (ZANAFLEX) 2 MG capsule, Take 1 capsule (2 mg total)  by mouth 3 (three) times daily., Disp: 30 capsule, Rfl: 0   XARELTO 10 MG TABS tablet, TAKE 1 TABLET BY MOUTH ONCE DAILY, Disp: 90 tablet, Rfl: 0  Current Facility-Administered Medications:    lidocaine (XYLOCAINE) 2 % jelly 1 application, 1 application., Urethral, Once, Stoioff, Ronda Fairly, MD  Allergies  Allergen Reactions   Macrobid [Nitrofurantoin]    Penicillins     Sickness/sweating   Sulfa Antibiotics Itching      Review of Systems  Constitutional: Negative.   HENT: Negative.    Eyes: Negative.   Respiratory:  Negative.    Cardiovascular: Negative.   Gastrointestinal: Negative.   Endocrine: Negative.   Genitourinary: Negative.   Musculoskeletal: Negative.   Skin: Negative.   Allergic/Immunologic: Negative.   Neurological: Negative.   Hematological: Negative.   Psychiatric/Behavioral: Negative.    All other systems reviewed and are negative.    Objective:   Virtual encounter, vitals limited, only able to obtain the following There were no vitals filed for this visit. There is no height or weight on file to calculate BMI. Nursing Note and Vital Signs reviewed.  Physical Exam Pt not examined  PE limited by telephone encounter  No results found for this or any previous visit (from the past 72 hour(s)).  Assessment and Plan:   Patient is a 66 year old male who is currently living with his sister.  One of their family members died and Gerald Montgomery moved in with his sister who is now caring for him.  He is in a wheelchair and her home does not have a ramp so he has been unable to come into the office for his doctors visits.  He has only been living with her for about the past 3 months but I have not seen him for almost a year and a half. She made this appointment today because of his complaints of abdominal pain.  I was unable to see or speak with the patient at all today.  She reports that he appears well but will make occasional complaints of pain even with mornings when he wakes up when he appears to sleep well all night.  She has managed to keep his bowel movements more regular he did previously have some constipation and she was able to do an enema and get a large amount of stool to pass and he has since had regular soft bowel movements.  She does note that he has sometimes some abdominal bloating and she can adjust his positioning and help him pass gas and that seems to help the bloating.  Otherwise he has normal appetite and she has not noticed any weight changes.  At this time for his abdominal  complaints I explained I would be unable to give her any recommendations until is able to examine him but overall it does not seem that he is having any severe reflux issues.  If he does she could always add Pepcid to his other GERD medications  I also explained to his sister today that I am unable to help him without examining him and seeing him in the office occasionally.  She anticipates that a ramp will be built very soon.  A referral was put into palliative care and the patient and sister are talking to chronic care management -I encouraged her to call her insurance and see if there is any other services which are covered by insurance which allowed medical providers to come to them in their home.   Constipation, unspecified constipation type Continue  diet and stool softeners - current seems controlled per pt's sisters report  Abdominal pain, unspecified abdominal location Unable to assess pt for this w/o speaking to or examining him  Gastroesophageal reflux disease without esophagitis Continue same meds - can use pepcid OTC as needed   - Amb Referral to Palliative Care Referral for assistance with in home care?  For all of his dx -  Cerebral palsy, unspecified type ([place])  History of DVT of lower extremity - keeps him on long term high risk medications which I do not feel comfortable continuing to prescribe w/o seeing pt   Hyperlipidemia, unspecified hyperlipidemia type - overdue for OV and labs Suprapubic catheter ([place]) - ongoing care from urology and [place] Inability to acquire transportation - prior CCM referral    -Red flags and when to present for emergency care or RTC including fever >101.38F, chest pain, shortness of breath, new/worsening/un-resolving symptoms, reviewed with patient at time of visit. Follow up and care instructions discussed and provided in AVS. - I discussed the assessment and treatment plan with the patient. The patient was provided an opportunity to ask questions  and all were answered. The patient agreed with the plan and demonstrated an understanding of the instructions.  I provided 20+ minutes of non-face-to-face time during this encounter.  Delsa Grana, PA-C 10/06/21 3:46 PM

## 2021-10-09 ENCOUNTER — Telehealth: Payer: Self-pay | Admitting: Family Medicine

## 2021-10-09 DIAGNOSIS — F329 Major depressive disorder, single episode, unspecified: Secondary | ICD-10-CM | POA: Diagnosis not present

## 2021-10-09 DIAGNOSIS — N319 Neuromuscular dysfunction of bladder, unspecified: Secondary | ICD-10-CM | POA: Diagnosis not present

## 2021-10-09 DIAGNOSIS — Z435 Encounter for attention to cystostomy: Secondary | ICD-10-CM | POA: Diagnosis not present

## 2021-10-09 DIAGNOSIS — N39498 Other specified urinary incontinence: Secondary | ICD-10-CM | POA: Diagnosis not present

## 2021-10-09 DIAGNOSIS — Z466 Encounter for fitting and adjustment of urinary device: Secondary | ICD-10-CM | POA: Diagnosis not present

## 2021-10-09 DIAGNOSIS — G809 Cerebral palsy, unspecified: Secondary | ICD-10-CM | POA: Diagnosis not present

## 2021-10-09 NOTE — Telephone Encounter (Signed)
Pt sister called and stated that [employer] called her and stated that they needs orders from [facility] in regards to orders. She states that Lucio Edward had suggested this. She also wanted [place] to know that they do not know when the ramp will be built. Please advise  Phone# [phone]

## 2021-10-14 ENCOUNTER — Encounter: Payer: Self-pay | Admitting: Oncology

## 2021-10-14 ENCOUNTER — Telehealth: Payer: Self-pay | Admitting: Student

## 2021-10-14 NOTE — Telephone Encounter (Signed)
Spoke with patient's sister [name], regarding the Palliative referral/services and all questions were answered and she was in agreement with beginning services, verbal consent obtained.  I have scheduled a MyChart Palliative Consult for 10/21/21 @ 9:30 AM

## 2021-10-17 DIAGNOSIS — N39498 Other specified urinary incontinence: Secondary | ICD-10-CM | POA: Diagnosis not present

## 2021-10-17 DIAGNOSIS — G809 Cerebral palsy, unspecified: Secondary | ICD-10-CM | POA: Diagnosis not present

## 2021-10-17 DIAGNOSIS — N319 Neuromuscular dysfunction of bladder, unspecified: Secondary | ICD-10-CM | POA: Diagnosis not present

## 2021-10-17 DIAGNOSIS — Z435 Encounter for attention to cystostomy: Secondary | ICD-10-CM | POA: Diagnosis not present

## 2021-10-17 DIAGNOSIS — Z466 Encounter for fitting and adjustment of urinary device: Secondary | ICD-10-CM | POA: Diagnosis not present

## 2021-10-17 DIAGNOSIS — F329 Major depressive disorder, single episode, unspecified: Secondary | ICD-10-CM | POA: Diagnosis not present

## 2021-10-21 ENCOUNTER — Telehealth: Payer: Self-pay | Admitting: Student

## 2021-10-21 ENCOUNTER — Telehealth: Payer: Medicare Other | Admitting: Student

## 2021-10-21 NOTE — Telephone Encounter (Signed)
Spoke with patient's sister [name] and have rescheduled the Palliative Consult to 10/28/21 @ 9:30 AM.

## 2021-10-24 ENCOUNTER — Other Ambulatory Visit: Payer: Self-pay | Admitting: Family Medicine

## 2021-10-24 ENCOUNTER — Ambulatory Visit (INDEPENDENT_AMBULATORY_CARE_PROVIDER_SITE_OTHER): Payer: Medicare Other | Admitting: *Deleted

## 2021-10-24 DIAGNOSIS — K219 Gastro-esophageal reflux disease without esophagitis: Secondary | ICD-10-CM

## 2021-10-24 DIAGNOSIS — G809 Cerebral palsy, unspecified: Secondary | ICD-10-CM

## 2021-10-24 DIAGNOSIS — E785 Hyperlipidemia, unspecified: Secondary | ICD-10-CM

## 2021-10-24 DIAGNOSIS — F321 Major depressive disorder, single episode, moderate: Secondary | ICD-10-CM

## 2021-10-24 NOTE — Chronic Care Management (AMB) (Signed)
Chronic Care Management    Clinical Social Work Note  10/24/2021 Name: Gerald Montgomery MRN: [id number] DOB: October 23, 1955  Gerald Montgomery is a 66 y.o. year old male who is a primary care patient of Delsa Grana, [place]. The CCM team was consulted to assist the patient with chronic disease management and/or care coordination needs related to: [employer] .   Collaboration with patient's caregiver  for follow up visit in response to provider referral for social work chronic care management and care coordination services.   Consent to Services:  The patient was given information about Chronic Care Management services, agreed to services, and gave verbal consent prior to initiation of services.  Please see initial visit note for detailed documentation.   Patient agreed to services and consent obtained.   Assessment: Review of patient past medical history, allergies, medications, and health status, including review of relevant consultants reports was performed today as part of a comprehensive evaluation and provision of chronic care management and care coordination services.     SDOH (Social Determinants of Health) assessments and interventions performed:    Advanced Directives Status: Not addressed in this encounter.  CCM Care Plan  Allergies  Allergen Reactions   Macrobid [Nitrofurantoin]    Penicillins     Sickness/sweating   Sulfa Antibiotics Itching    Outpatient Encounter Medications as of 10/24/2021  Medication Sig   acetaminophen (TYLENOL) 325 MG tablet Take 1 tablet (325 mg total) by mouth every 6 (six) hours as needed for mild pain (or Fever >/= 101).   ARIPiprazole (ABILIFY) 15 MG tablet TAKE 1 TABLET BY MOUTH ONCE DAILY   citalopram (CELEXA) 20 MG tablet TAKE 1 TABLET BY MOUTH ONCE DAILY   fluticasone (FLONASE) 50 MCG/ACT nasal spray Place into both nostrils daily.   nystatin cream (MYCOSTATIN) APPLY TOPICALLY 3 TIMES DAILY TO AFFECTED AREA   oxybutynin (DITROPAN) 5 MG  tablet Take 1 tablet (5 mg total) by mouth 2 (two) times daily.   pantoprazole (PROTONIX) 40 MG tablet TAKE 1 TABLET BY MOUTH ONCE DAILY   rosuvastatin (CRESTOR) 5 MG tablet Take 1 tablet (5 mg total) by mouth daily.   tizanidine (ZANAFLEX) 2 MG capsule Take 1 capsule (2 mg total) by mouth 3 (three) times daily.   XARELTO 10 MG TABS tablet TAKE 1 TABLET BY MOUTH ONCE DAILY   Facility-Administered Encounter Medications as of 10/24/2021  Medication   lidocaine (XYLOCAINE) 2 % jelly 1 application    Patient Active Problem List   Diagnosis Date Noted   Current moderate episode of major depressive disorder ([place]) 04/10/2020   Suprapubic catheter ([place]) 04/10/2020   Hyperlipidemia, unspecified 03/23/2019   Iron deficiency anemia 08/24/2017   Hypochromic microcytic anemia 08/03/2017   GERD (gastroesophageal reflux disease) 08/03/2017   Stage 1 decubitus ulcer 08/03/2017   Abnormal urine odor 07/16/2016   Need for home health care 06/17/2016   Hyperglycemia 03/04/2016   Screening for lipid disorders 03/04/2016   Cerumen impaction 02/12/2016   Cerebral palsy ([place]) 12/05/2015   History of DVT of lower extremity 04/11/2015   Neurogenic bladder 04/11/2015   Monitoring for long-term anticoagulant use 04/11/2015    Conditions to be addressed/monitored:   cerebral palsy ; ADL IADL limitations and Lacks knowledge of community resource: wheelchair ramp Care Plan : General Social Work (Adult)  Updates made by [employer], Darla Lesches, LCSW since 10/24/2021 12:00 AM     Problem: CHL AMB "PATIENT-SPECIFIC PROBLEM"   Note:   CARE PLAN ENTRY (see longitudinal  plan of care for additional care plan information)  Current Barriers:  Patient with  cerebral palsy  in need of assistance with connection to community resources  Knowledge deficits and need for support, education and care coordination related to community resources support  Inability to perform ADL's independently, Inability to perform IADL's  independently, and Lacks knowledge of community resource: wheelchair ramp  Clinical Goal(s)  Over the next 90 days, patient will work with CAP services and [facility] to secure wheelchair ramp  Interventions provided by LCSW:  Continued to assess patient's care coordination needs related to patient's current medical condition(cerebral palsy) and discussed ongoing care management follow up  Confirmed that patient's caregiver continues to  receive Community Alternative Program-(CAP) services-CAP worker is [employer] who is currently working in conjunction with [facility] on getting wheelchair ramp arranged for patient  Patient's caregiver confirmed that wheelchair ramp application process in underway-she is currently awaiting additional paperwork to be signed to continue process CAP worker continues to work on arranging in home care on the weekends to assist with patient's care Patient's sister requesting assistance with applying for full guardianship for patient which would allow patient's sister to sign legal documents on his behalf-it was explained that patient's caregiver may need to consult with an attorney regarding this as well as the [facility] [place] nurse scheduled to visit patient on 10/28/21 Collaborated with appropriate clinical care team members regarding patient needs Active listening / Reflection utilized  Emotional Support Provided Caregiver stress acknowledged    Patient Self Care Activities & Deficits:  Patient is unable to independently navigate community resource options without care coordination support  Acknowledges deficits and is motivated to resolve concern  Unable to perform ADLs independently Unable to perform IADLs independently Strong family or social support  Please see past updates related to this goal by clicking on the "Past Updates" button in the selected goal          Follow Up Plan:  Client's sister will continue to work with the [facility] to assist with obtaining the wheelchair ramp      Elliot Gurney, [facility] Care Management [phone]

## 2021-10-24 NOTE — Patient Instructions (Signed)
Visit Information  Thank you for taking time to visit with me today. Please don't hesitate to contact me if I can be of assistance to you before our next scheduled telephone appointment.  Following are the goals we discussed today:   - begin a notebook of services in my neighborhood or community - call 211 when I need some help - follow-up on any referrals for help I am given - think ahead to make sure my need does not become an emergency - have a back-up plan  -follow up with [facility] regarding paperwork for wheelchair ramp-contact-Shaniece Ilda Foil [phone] assistant Bethena Roys [phone] assistant Sharyn Lull [phone] that paperwork was completed and returned -follow up with patient's CAP worker regarding full guardianship status   Please call the care guide team at [phone] if you need to cancel or reschedule your appointment.   If you are experiencing a Mental Health or [place] or need someone to talk to, please call the Suicide and Crisis Lifeline: 988   Patient verbalizes understanding of instructions and care plan provided today and agrees to view in [place]. Active MyChart status and patient understanding of how to access instructions and care plan via MyChart confirmed with patient.     No further follow up required: Patient's sister to continue to work with [facility] to obtain wheelchair ramp  Elliot Gurney, [facility] Care Management [phone]

## 2021-10-25 DIAGNOSIS — E785 Hyperlipidemia, unspecified: Secondary | ICD-10-CM | POA: Diagnosis not present

## 2021-10-25 DIAGNOSIS — Z95828 Presence of other vascular implants and grafts: Secondary | ICD-10-CM | POA: Diagnosis not present

## 2021-10-25 DIAGNOSIS — N39498 Other specified urinary incontinence: Secondary | ICD-10-CM | POA: Diagnosis not present

## 2021-10-25 DIAGNOSIS — Z466 Encounter for fitting and adjustment of urinary device: Secondary | ICD-10-CM | POA: Diagnosis not present

## 2021-10-25 DIAGNOSIS — Z86718 Personal history of other venous thrombosis and embolism: Secondary | ICD-10-CM | POA: Diagnosis not present

## 2021-10-25 DIAGNOSIS — Z435 Encounter for attention to cystostomy: Secondary | ICD-10-CM | POA: Diagnosis not present

## 2021-10-25 DIAGNOSIS — N319 Neuromuscular dysfunction of bladder, unspecified: Secondary | ICD-10-CM | POA: Diagnosis not present

## 2021-10-25 DIAGNOSIS — M24542 Contracture, left hand: Secondary | ICD-10-CM | POA: Diagnosis not present

## 2021-10-25 DIAGNOSIS — G809 Cerebral palsy, unspecified: Secondary | ICD-10-CM | POA: Diagnosis not present

## 2021-10-25 DIAGNOSIS — Z7901 Long term (current) use of anticoagulants: Secondary | ICD-10-CM | POA: Diagnosis not present

## 2021-10-25 DIAGNOSIS — M24522 Contracture, left elbow: Secondary | ICD-10-CM | POA: Diagnosis not present

## 2021-10-25 DIAGNOSIS — K219 Gastro-esophageal reflux disease without esophagitis: Secondary | ICD-10-CM | POA: Diagnosis not present

## 2021-10-25 DIAGNOSIS — F329 Major depressive disorder, single episode, unspecified: Secondary | ICD-10-CM | POA: Diagnosis not present

## 2021-10-27 ENCOUNTER — Ambulatory Visit: Payer: Medicare Other | Admitting: Family Medicine

## 2021-10-28 ENCOUNTER — Telehealth: Payer: Self-pay | Admitting: Student

## 2021-10-28 ENCOUNTER — Telehealth: Payer: Self-pay | Admitting: Family Medicine

## 2021-10-28 ENCOUNTER — Encounter: Payer: Self-pay | Admitting: Student

## 2021-10-28 ENCOUNTER — Telehealth: Payer: Medicare Other | Admitting: Student

## 2021-10-28 NOTE — Telephone Encounter (Signed)
Copied from [place] [phone]. Topic: Appointment Scheduling - Scheduling Inquiry for Clinic >> Oct 28, 2021 12:05 PM Valere Dross wrote: Reason for CRM: Pts sister called in stating she apologized about missing the pts virtual appt today at 9:30, with [facility], she was requesting to get a call back by her to reschedule that appt, please advise.

## 2021-10-28 NOTE — Telephone Encounter (Signed)
Spoke with patient's sister [name] and we have rescheduled the [place] that was scheduled for today to an In-home Consult for 11/04/21 @ 10:34 AM

## 2021-10-29 NOTE — Telephone Encounter (Signed)
Thank you, patient has been rescheduled.

## 2021-11-04 ENCOUNTER — Other Ambulatory Visit: Payer: Medicare Other | Admitting: Student

## 2021-11-04 DIAGNOSIS — G809 Cerebral palsy, unspecified: Secondary | ICD-10-CM

## 2021-11-04 DIAGNOSIS — K219 Gastro-esophageal reflux disease without esophagitis: Secondary | ICD-10-CM

## 2021-11-04 DIAGNOSIS — R141 Gas pain: Secondary | ICD-10-CM

## 2021-11-04 DIAGNOSIS — Z515 Encounter for palliative care: Secondary | ICD-10-CM

## 2021-11-04 DIAGNOSIS — N319 Neuromuscular dysfunction of bladder, unspecified: Secondary | ICD-10-CM

## 2021-11-04 NOTE — Progress Notes (Signed)
[facility] Palliative Care Consult Note Telephone: [phone]  Fax: [phone]   Date of encounter: 11/04/21 10:25 AM PATIENT NAME: Gerald Montgomery [id number]   [phone] (home) [phone] (work) DOB: 10/28/1955 MRN: [id number] PRIMARY CARE PROVIDER:    Laurell Roof  [address] [place] [address] [phone]  REFERRING PROVIDER:   Delsa Grana, PA-C [address] [place],  [place] [address] [phone]  RESPONSIBLE PARTY:    Contact Information     Name Relation Home Work Mobile   [place] Sister   [phone]        I met face to face with patient and family in the home. Palliative Care was asked to follow this patient by consultation request of  Delsa Grana, PA-C to address advance care planning and complex medical decision making. This is the initial visit.                                     ASSESSMENT AND PLAN / RECOMMENDATIONS:   Advance Care Planning/Goals of Care: Goals include to maximize quality of life and symptom management. Patient/health care surrogate gave his/her permission to discuss.Our advance care planning conversation included a discussion about:    The value and importance of advance care planning  Experiences with loved ones who have been seriously ill or have died  Exploration of personal, cultural or spiritual beliefs that might influence medical decisions  Exploration of goals of care in the event of a sudden injury or illness  Sister [name] is patient's legal guardian CODE STATUS: TBD; ongoing discussion.  Education provided on palliative medicine vs. Hospice services. Palliative medicine will continue to provide ongoing support, symptom management. Will monitor for changes/declines and will refer to hospice should he meet criteria. Sister is awaiting response for ramp to be built in the home.   Symptom Management/Plan:  Cerebral  palsy- patient is dependent for all adl's. 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 Stiffness of left leg. Recommend rolled cloth in left hand. OOB as tolerated, using mechanical lift. Monitor for skin breakdown. Continue using barrier protectant to buttocks/sacrum; turn and reposition every 2 hours. Monitor for swallowing difficulty, aspiration.   GERD, indigestion, gas- continue Gas X PRN; which has been effective for patient. Recommend taking protonix should symptoms worsen/persist. Sister would like to limit medications at this time if possible. Encourage sitting up for at least 30 minutes after eating.   Neurogenic bladder-has suprapubic catheter in place; changed out monthly per [facility]. Monitor for patency, complications. Continue oxybutynin for bladder spasms.   Follow up Palliative Care Visit: Palliative care will continue to follow for complex medical decision making, advance care planning, and clarification of goals. Return in 8 weeks or prn.   This visit was coded based on medical decision making (MDM).  PPS: 30%  HOSPICE ELIGIBILITY/DIAGNOSIS: TBD  Chief Complaint: Palliative Medicine initial visit.   HISTORY OF PRESENT ILLNESS:  Gerald Montgomery is a 66 y.o. year old male  with Cerebral palsy, hyperlipidemia, major depressive disorder, iron deficiency anemia, GERD, neurogenic bladder, super pubic catheter, history of DVT.    Patient resides in the home; sister [name] is his legal guardian. He denies pain, shortness of breath. Eating well, no swallowing difficulty. Taking medications whole. He does have occasional GERD symptoms, gas and indigestion. He is out of bed  daily to w/c via mechanical lift. He does have left leg/knee contracture. Patient had been ambulatory up until he fell and sustained hip/leg fracture. Sister denies any functional declines or changes. He is having more difficulty getting out of the home; family is awaiting ramp to be built. Skin  is intact; uses boudreax butt paste for occasional redness. Suprapubic catheter is changed out monthly per [facility]. A 10-point ROS is negative, except for the pertinent positives and negatives detailed per the HPI.    History obtained from review of EMR, discussion with primary team, and interview with family, facility staff/caregiver and/or Gerald Montgomery.  I reviewed available labs, medications, imaging, studies and related documents from the EMR.  Records reviewed and summarized above.    Physical Exam:  Pulse 72, resp 16, sats 96%, 142/84, Sats 96% on room air Constitutional: NAD General: frail appearing EYES: anicteric sclera, lids intact, no discharge  ENMT: intact hearing, oral mucous membranes moist, dentition intact CV: S1S2, RRR, no LE edema Pulmonary: LCTA, no increased work of breathing, no cough, room air Abdomen:  normo-active BS + 4 quadrants, soft and non tender, no ascites GU: deferred MSK: left wrist contracture, left leg/knee contracture, non- ambulatory Skin: warm and dry, no rashes or wounds on visible skin Neuro: generalized weakness Psych: non-anxious affect, A and O x 3, pleasant Hem/lymph/immuno: no widespread bruising CURRENT PROBLEM LIST:  Patient Active Problem List   Diagnosis Date Noted   Current moderate episode of major depressive disorder ([place]) 04/10/2020   Suprapubic catheter ([place]) 04/10/2020   Hyperlipidemia, unspecified 03/23/2019   Iron deficiency anemia 08/24/2017   Hypochromic microcytic anemia 08/03/2017   GERD (gastroesophageal reflux disease) 08/03/2017   Stage 1 decubitus ulcer 08/03/2017   Abnormal urine odor 07/16/2016   Need for home health care 06/17/2016   Hyperglycemia 03/04/2016   Screening for lipid disorders 03/04/2016   Cerumen impaction 02/12/2016   Cerebral palsy ([place]) 12/05/2015   History of DVT of lower extremity 04/11/2015   Neurogenic bladder 04/11/2015   Monitoring for long-term anticoagulant use 04/11/2015    PAST MEDICAL HISTORY:  Active Ambulatory Problems    Diagnosis Date Noted   History of DVT of lower extremity 04/11/2015   Neurogenic bladder 04/11/2015   Monitoring for long-term anticoagulant use 04/11/2015   Cerebral palsy ([place]) 12/05/2015   Cerumen impaction 02/12/2016   Hyperglycemia 03/04/2016   Screening for lipid disorders 03/04/2016   Need for home health care 06/17/2016   Abnormal urine odor 07/16/2016   Hypochromic microcytic anemia 08/03/2017   GERD (gastroesophageal reflux disease) 08/03/2017   Stage 1 decubitus ulcer 08/03/2017   Iron deficiency anemia 08/24/2017   Hyperlipidemia, unspecified 03/23/2019   Current moderate episode of major depressive disorder ([place]) 04/10/2020   Suprapubic catheter ([place]) 04/10/2020   Resolved Ambulatory Problems    Diagnosis Date Noted   Mood disorder ([place]) 04/11/2015   Urinary tract infection 08/16/2015   Hematuria, gross 10/07/2015   Pneumonia 02/05/2016   LLL pneumonia 02/12/2016   Stage I pressure ulcer of right upper back 08/27/2016   Sepsis ([place]) 02/05/2017   Pressure injury of skin 02/06/2017   Past Medical History:  Diagnosis Date   Depression    Urinary incontinence    SOCIAL HX:  Social History   Tobacco Use   Smoking status: Never    Passive exposure: Never   Smokeless tobacco: Never  Substance Use Topics   Alcohol use: No    Alcohol/week: 0.0 standard drinks of alcohol   FAMILY  HX:  Family History  Problem Relation Age of Onset   Heart disease Mother    Atrial fibrillation Mother    Diabetes Mother        Borderline   Heart disease Father        Passed away of heart attack   Kidney disease Neg Hx    Prostate cancer Neg Hx       ALLERGIES:  Allergies  Allergen Reactions   Macrobid [Nitrofurantoin]    Penicillins     Sickness/sweating   Sulfa Antibiotics Itching     PERTINENT MEDICATIONS:  Outpatient Encounter Medications as of 11/04/2021  Medication Sig   acetaminophen (TYLENOL) 325 MG  tablet Take 1 tablet (325 mg total) by mouth every 6 (six) hours as needed for mild pain (or Fever >/= 101).   ARIPiprazole (ABILIFY) 15 MG tablet TAKE 1 TABLET BY MOUTH ONCE DAILY   citalopram (CELEXA) 20 MG tablet TAKE 1 TABLET BY MOUTH ONCE DAILY   fluticasone (FLONASE) 50 MCG/ACT nasal spray Place into both nostrils daily.   nystatin cream (MYCOSTATIN) APPLY TOPICALLY 3 TIMES DAILY TO AFFECTED AREA   oxybutynin (DITROPAN) 5 MG tablet Take 1 tablet (5 mg total) by mouth 2 (two) times daily.   pantoprazole (PROTONIX) 40 MG tablet TAKE 1 TABLET BY MOUTH ONCE DAILY   rosuvastatin (CRESTOR) 5 MG tablet Take 1 tablet (5 mg total) by mouth daily.   tizanidine (ZANAFLEX) 2 MG capsule Take 1 capsule (2 mg total) by mouth 3 (three) times daily.   XARELTO 10 MG TABS tablet TAKE 1 TABLET BY MOUTH ONCE DAILY   Facility-Administered Encounter Medications as of 11/04/2021  Medication   lidocaine (XYLOCAINE) 2 % jelly 1 application   Thank you for the opportunity to participate in the care of Gerald Montgomery.  The palliative care team will continue to follow. Please call our office at [phone] if we can be of additional assistance.   Ezekiel Slocumb, NP   COVID-19 PATIENT SCREENING TOOL Asked and negative response unless otherwise noted:  Have you had symptoms of covid, tested positive or been in contact with someone with symptoms/positive test in the past 5-10 days? No

## 2021-11-05 ENCOUNTER — Telehealth: Payer: Self-pay

## 2021-11-05 DIAGNOSIS — Z466 Encounter for fitting and adjustment of urinary device: Secondary | ICD-10-CM | POA: Diagnosis not present

## 2021-11-05 DIAGNOSIS — Z435 Encounter for attention to cystostomy: Secondary | ICD-10-CM | POA: Diagnosis not present

## 2021-11-05 DIAGNOSIS — F329 Major depressive disorder, single episode, unspecified: Secondary | ICD-10-CM | POA: Diagnosis not present

## 2021-11-05 DIAGNOSIS — N39498 Other specified urinary incontinence: Secondary | ICD-10-CM | POA: Diagnosis not present

## 2021-11-05 DIAGNOSIS — G809 Cerebral palsy, unspecified: Secondary | ICD-10-CM | POA: Diagnosis not present

## 2021-11-05 DIAGNOSIS — N319 Neuromuscular dysfunction of bladder, unspecified: Secondary | ICD-10-CM | POA: Diagnosis not present

## 2021-11-05 NOTE — Telephone Encounter (Signed)
Message was left by Sonya from home health care asking if we can give orders for them to increase patient catheter size from 16 to 54fas pt catheter is clogging every two weeks. As per SZara CouncilPA-C pt would need to come in for an appointment as it is an upsize in SP tube. Left message for Home health to return my call [phone]

## 2021-11-05 NOTE — Telephone Encounter (Signed)
Spoke with Gerald Montgomery, she states there is a [employer] [facility] that comes from Palliative care asking if we can put in a order. As per Zara Council PA-C, We can not place order for upsize. If the NP from palliative care will upsize she would have to put in order. Sonya from [facility] verbalized understanding.

## 2021-11-06 ENCOUNTER — Encounter: Payer: Self-pay | Admitting: Student

## 2021-11-07 ENCOUNTER — Telehealth: Payer: Self-pay

## 2021-11-07 NOTE — Telephone Encounter (Signed)
PC SW outreached patients sister, [name], per [facility] NP L. [place] request.  Sister shared that she has been in a waiting period about receiving a ramp for patient. Sister shares that she has had communication with [email or web address]  [facility]  - [phone] - [phone]  Sister is unsure what is needed further from her to complete the process of the ramp, as she feels she may be misunderstanding the process and what is needed. Sister shared she had a Chief Strategy Officer come and assess for built, charge $3000. She says that her husband can build the ramp but needs to know incline.   SW emailed [employer] at the email address provided by sister to inquire on next steps on sister behalf. Awaiting response.

## 2021-11-21 DIAGNOSIS — Z466 Encounter for fitting and adjustment of urinary device: Secondary | ICD-10-CM | POA: Diagnosis not present

## 2021-11-21 DIAGNOSIS — Z435 Encounter for attention to cystostomy: Secondary | ICD-10-CM | POA: Diagnosis not present

## 2021-11-21 DIAGNOSIS — N319 Neuromuscular dysfunction of bladder, unspecified: Secondary | ICD-10-CM | POA: Diagnosis not present

## 2021-11-21 DIAGNOSIS — F329 Major depressive disorder, single episode, unspecified: Secondary | ICD-10-CM | POA: Diagnosis not present

## 2021-11-21 DIAGNOSIS — G809 Cerebral palsy, unspecified: Secondary | ICD-10-CM | POA: Diagnosis not present

## 2021-11-21 DIAGNOSIS — E785 Hyperlipidemia, unspecified: Secondary | ICD-10-CM

## 2021-11-21 DIAGNOSIS — N39498 Other specified urinary incontinence: Secondary | ICD-10-CM | POA: Diagnosis not present

## 2021-11-24 DIAGNOSIS — K219 Gastro-esophageal reflux disease without esophagitis: Secondary | ICD-10-CM | POA: Diagnosis not present

## 2021-11-24 DIAGNOSIS — N39498 Other specified urinary incontinence: Secondary | ICD-10-CM | POA: Diagnosis not present

## 2021-11-24 DIAGNOSIS — F329 Major depressive disorder, single episode, unspecified: Secondary | ICD-10-CM | POA: Diagnosis not present

## 2021-11-24 DIAGNOSIS — N319 Neuromuscular dysfunction of bladder, unspecified: Secondary | ICD-10-CM | POA: Diagnosis not present

## 2021-11-24 DIAGNOSIS — Z7901 Long term (current) use of anticoagulants: Secondary | ICD-10-CM | POA: Diagnosis not present

## 2021-11-24 DIAGNOSIS — M24522 Contracture, left elbow: Secondary | ICD-10-CM | POA: Diagnosis not present

## 2021-11-24 DIAGNOSIS — E785 Hyperlipidemia, unspecified: Secondary | ICD-10-CM | POA: Diagnosis not present

## 2021-11-24 DIAGNOSIS — Z435 Encounter for attention to cystostomy: Secondary | ICD-10-CM | POA: Diagnosis not present

## 2021-11-24 DIAGNOSIS — G809 Cerebral palsy, unspecified: Secondary | ICD-10-CM | POA: Diagnosis not present

## 2021-11-24 DIAGNOSIS — M24542 Contracture, left hand: Secondary | ICD-10-CM | POA: Diagnosis not present

## 2021-11-24 DIAGNOSIS — Z95828 Presence of other vascular implants and grafts: Secondary | ICD-10-CM | POA: Diagnosis not present

## 2021-11-24 DIAGNOSIS — Z86718 Personal history of other venous thrombosis and embolism: Secondary | ICD-10-CM | POA: Diagnosis not present

## 2021-11-24 DIAGNOSIS — Z466 Encounter for fitting and adjustment of urinary device: Secondary | ICD-10-CM | POA: Diagnosis not present

## 2021-11-27 DIAGNOSIS — N319 Neuromuscular dysfunction of bladder, unspecified: Secondary | ICD-10-CM | POA: Diagnosis not present

## 2021-11-27 DIAGNOSIS — N39498 Other specified urinary incontinence: Secondary | ICD-10-CM | POA: Diagnosis not present

## 2021-11-27 DIAGNOSIS — Z466 Encounter for fitting and adjustment of urinary device: Secondary | ICD-10-CM | POA: Diagnosis not present

## 2021-11-27 DIAGNOSIS — G809 Cerebral palsy, unspecified: Secondary | ICD-10-CM | POA: Diagnosis not present

## 2021-11-27 DIAGNOSIS — F329 Major depressive disorder, single episode, unspecified: Secondary | ICD-10-CM | POA: Diagnosis not present

## 2021-11-27 DIAGNOSIS — Z435 Encounter for attention to cystostomy: Secondary | ICD-10-CM | POA: Diagnosis not present

## 2021-12-08 DIAGNOSIS — F329 Major depressive disorder, single episode, unspecified: Secondary | ICD-10-CM | POA: Diagnosis not present

## 2021-12-08 DIAGNOSIS — N39498 Other specified urinary incontinence: Secondary | ICD-10-CM | POA: Diagnosis not present

## 2021-12-08 DIAGNOSIS — G809 Cerebral palsy, unspecified: Secondary | ICD-10-CM | POA: Diagnosis not present

## 2021-12-08 DIAGNOSIS — Z435 Encounter for attention to cystostomy: Secondary | ICD-10-CM | POA: Diagnosis not present

## 2021-12-08 DIAGNOSIS — Z466 Encounter for fitting and adjustment of urinary device: Secondary | ICD-10-CM | POA: Diagnosis not present

## 2021-12-08 DIAGNOSIS — N319 Neuromuscular dysfunction of bladder, unspecified: Secondary | ICD-10-CM | POA: Diagnosis not present

## 2021-12-09 ENCOUNTER — Other Ambulatory Visit: Payer: Self-pay | Admitting: Family Medicine

## 2021-12-09 DIAGNOSIS — F39 Unspecified mood [affective] disorder: Secondary | ICD-10-CM

## 2021-12-19 ENCOUNTER — Other Ambulatory Visit: Payer: Self-pay | Admitting: Family Medicine

## 2021-12-19 DIAGNOSIS — Z86718 Personal history of other venous thrombosis and embolism: Secondary | ICD-10-CM

## 2021-12-24 DIAGNOSIS — N319 Neuromuscular dysfunction of bladder, unspecified: Secondary | ICD-10-CM | POA: Diagnosis not present

## 2021-12-24 DIAGNOSIS — Z435 Encounter for attention to cystostomy: Secondary | ICD-10-CM | POA: Diagnosis not present

## 2021-12-24 DIAGNOSIS — Z86718 Personal history of other venous thrombosis and embolism: Secondary | ICD-10-CM | POA: Diagnosis not present

## 2021-12-24 DIAGNOSIS — Z95828 Presence of other vascular implants and grafts: Secondary | ICD-10-CM | POA: Diagnosis not present

## 2021-12-24 DIAGNOSIS — G809 Cerebral palsy, unspecified: Secondary | ICD-10-CM | POA: Diagnosis not present

## 2021-12-24 DIAGNOSIS — M24542 Contracture, left hand: Secondary | ICD-10-CM | POA: Diagnosis not present

## 2021-12-24 DIAGNOSIS — Z466 Encounter for fitting and adjustment of urinary device: Secondary | ICD-10-CM | POA: Diagnosis not present

## 2021-12-24 DIAGNOSIS — N39498 Other specified urinary incontinence: Secondary | ICD-10-CM | POA: Diagnosis not present

## 2021-12-24 DIAGNOSIS — Z7901 Long term (current) use of anticoagulants: Secondary | ICD-10-CM | POA: Diagnosis not present

## 2021-12-24 DIAGNOSIS — K219 Gastro-esophageal reflux disease without esophagitis: Secondary | ICD-10-CM | POA: Diagnosis not present

## 2021-12-24 DIAGNOSIS — M24522 Contracture, left elbow: Secondary | ICD-10-CM | POA: Diagnosis not present

## 2021-12-24 DIAGNOSIS — E785 Hyperlipidemia, unspecified: Secondary | ICD-10-CM | POA: Diagnosis not present

## 2021-12-24 DIAGNOSIS — F329 Major depressive disorder, single episode, unspecified: Secondary | ICD-10-CM | POA: Diagnosis not present

## 2021-12-29 DIAGNOSIS — Z466 Encounter for fitting and adjustment of urinary device: Secondary | ICD-10-CM | POA: Diagnosis not present

## 2021-12-29 DIAGNOSIS — Z435 Encounter for attention to cystostomy: Secondary | ICD-10-CM | POA: Diagnosis not present

## 2021-12-29 DIAGNOSIS — F329 Major depressive disorder, single episode, unspecified: Secondary | ICD-10-CM | POA: Diagnosis not present

## 2021-12-29 DIAGNOSIS — N39498 Other specified urinary incontinence: Secondary | ICD-10-CM | POA: Diagnosis not present

## 2021-12-29 DIAGNOSIS — G809 Cerebral palsy, unspecified: Secondary | ICD-10-CM | POA: Diagnosis not present

## 2021-12-29 DIAGNOSIS — N319 Neuromuscular dysfunction of bladder, unspecified: Secondary | ICD-10-CM | POA: Diagnosis not present

## 2022-01-06 ENCOUNTER — Other Ambulatory Visit: Payer: Medicare Other | Admitting: Student

## 2022-01-06 DIAGNOSIS — G809 Cerebral palsy, unspecified: Secondary | ICD-10-CM

## 2022-01-06 DIAGNOSIS — Z515 Encounter for palliative care: Secondary | ICD-10-CM | POA: Diagnosis not present

## 2022-01-06 DIAGNOSIS — N319 Neuromuscular dysfunction of bladder, unspecified: Secondary | ICD-10-CM

## 2022-01-06 NOTE — Progress Notes (Signed)
[facility] Palliative Care Consult Note Telephone: [phone]  Fax: [phone]    Date of encounter: 01/06/22 12:50 PM PATIENT NAME: Gerald Montgomery [address] [place] [address]   [phone] (home) [phone] (work) DOB: Feb 20, 1956 MRN: [id number] PRIMARY CARE PROVIDER:    Laurell Montgomery  [address] [place] [address] [phone]  REFERRING PROVIDER:   Delsa Grana, PA-C [address] [place],  [place] [address] [phone]  RESPONSIBLE PARTY:    Contact Information     Name Relation Home Work Mobile   Gerald Montgomery Sister   [phone]        I met face to face with patient and family in the home. Palliative Care was asked to follow this patient by consultation request of  Gerald Grana, PA-C to address advance care planning and complex medical decision making. This is a follow up visit.                                   ASSESSMENT AND PLAN / RECOMMENDATIONS:   Advance Care Planning/Goals of Care: Goals include to maximize quality of life and symptom management. Patient/health care surrogate gave his/her permission to discuss. Our advance care planning conversation included a discussion about:    The value and importance of advance care planning  Experiences with loved ones who have been seriously ill or have died  Exploration of personal, cultural or spiritual beliefs that might influence medical decisions  Exploration of goals of care in the event of a sudden injury or illness  Identification of a healthcare agent  Sister Gerald Montgomery is patient's legal guardian.  CODE STATUS: Full Code  Education provided on palliative medicine.  We will continue to provide supportive care, symptom management as needed.   Symptom Management/Plan:  Cerebral palsy-patient is dependent for all ADLs.  He is out of bed with mechanical lift; battery is currently dead on lift so patient has not been out of bed  in about a week.  Family is awaiting replacement battery. Patient with contracture to left wrist and hand, left knee /leg.  Continue rolled cloth in left hand.  Monitor for skin breakdown. Continue to use barrier protectant to buttocks and sacrum, turn and reposition every 2 hours.  No worsening of swallowing.  Monitor for worsening dysphagia.  Neurogenic bladder-has suprapubic catheter in place; changed out monthly per [facility]. Monitor for patency, complications. Continue oxybutynin for bladder spasms.   Follow up Palliative Care Visit: Palliative care will continue to follow for complex medical decision making, advance care planning, and clarification of goals. Return in 8 weeks or prn.   This visit was coded based on medical decision making (MDM).  PPS: 30%  HOSPICE ELIGIBILITY/DIAGNOSIS: TBD  Chief Complaint: Palliative Medicine follow up visit.   HISTORY OF PRESENT ILLNESS:  Gerald Montgomery is a 66 y.o. year old male  with Cerebral palsy, hyperlipidemia, major depressive disorder, iron deficiency anemia, GERD, neurogenic bladder, super pubic catheter, history of DVT.     Patient has moved back to his home. His sister and brother in law have moved in to help; care for patient. His family is very involved in his care. He has been in the bed for past week due to needing new battery on mechanical lift. Denies pain, shortness of breath, nausea, constipation. He is eating well; no worsening dysphagia. Skin is intact. Catheter drainage  bag with clear, yellow urine. Home health coming to change out catheter monthly.  Patient received resting in bed; pleasant affect. Answers direct questions. HPI and ROS primarily obtained from sister.  History obtained from review of EMR, discussion with primary team, and interview with family, facility staff/caregiver and/or Gerald Montgomery.  I reviewed available labs, medications, imaging, studies and related documents from the EMR.  Records reviewed and  summarized above.   Physical Exam:  Pulse 66, bp 132/80, sats 96% on room air Constitutional: NAD General: frail appearing EYES: anicteric sclera, lids intact, no discharge  ENMT: intact hearing, oral mucous membranes moist, dentition intact CV: S1S2, RRR, no LE edema Pulmonary: LCTA, no increased work of breathing, no cough, room air Abdomen: normo-active BS + 4 quadrants, soft and non tender GU: deferred MSK: left wrist contracture, left leg/knee contracture, non- ambulatory Skin: cool and dry, no rashes or wounds on visible skin Neuro: + generalized weakness, A &  O x 2 Psych: non-anxious affect Hem/lymph/immuno: no widespread bruising   Thank you for the opportunity to participate in the care of Gerald Montgomery.  The palliative care team will continue to follow. Please call our office at [phone] if we can be of additional assistance.   Gerald Slocumb, NP   COVID-19 PATIENT SCREENING TOOL Asked and negative response unless otherwise noted:   Have you had symptoms of covid, tested positive or been in contact with someone with symptoms/positive test in the past 5-10 days? No

## 2022-01-16 ENCOUNTER — Other Ambulatory Visit: Payer: Self-pay | Admitting: Family Medicine

## 2022-01-16 DIAGNOSIS — F321 Major depressive disorder, single episode, moderate: Secondary | ICD-10-CM

## 2022-01-21 DIAGNOSIS — Z435 Encounter for attention to cystostomy: Secondary | ICD-10-CM | POA: Diagnosis not present

## 2022-01-21 DIAGNOSIS — N319 Neuromuscular dysfunction of bladder, unspecified: Secondary | ICD-10-CM | POA: Diagnosis not present

## 2022-01-21 DIAGNOSIS — N39498 Other specified urinary incontinence: Secondary | ICD-10-CM | POA: Diagnosis not present

## 2022-01-21 DIAGNOSIS — Z466 Encounter for fitting and adjustment of urinary device: Secondary | ICD-10-CM | POA: Diagnosis not present

## 2022-01-21 DIAGNOSIS — F329 Major depressive disorder, single episode, unspecified: Secondary | ICD-10-CM | POA: Diagnosis not present

## 2022-01-21 DIAGNOSIS — G809 Cerebral palsy, unspecified: Secondary | ICD-10-CM | POA: Diagnosis not present

## 2022-01-23 DIAGNOSIS — M24542 Contracture, left hand: Secondary | ICD-10-CM | POA: Diagnosis not present

## 2022-01-23 DIAGNOSIS — Z7901 Long term (current) use of anticoagulants: Secondary | ICD-10-CM | POA: Diagnosis not present

## 2022-01-23 DIAGNOSIS — Z435 Encounter for attention to cystostomy: Secondary | ICD-10-CM | POA: Diagnosis not present

## 2022-01-23 DIAGNOSIS — E785 Hyperlipidemia, unspecified: Secondary | ICD-10-CM | POA: Diagnosis not present

## 2022-01-23 DIAGNOSIS — N319 Neuromuscular dysfunction of bladder, unspecified: Secondary | ICD-10-CM | POA: Diagnosis not present

## 2022-01-23 DIAGNOSIS — M24522 Contracture, left elbow: Secondary | ICD-10-CM | POA: Diagnosis not present

## 2022-01-23 DIAGNOSIS — Z86718 Personal history of other venous thrombosis and embolism: Secondary | ICD-10-CM | POA: Diagnosis not present

## 2022-01-23 DIAGNOSIS — K219 Gastro-esophageal reflux disease without esophagitis: Secondary | ICD-10-CM | POA: Diagnosis not present

## 2022-01-23 DIAGNOSIS — Z466 Encounter for fitting and adjustment of urinary device: Secondary | ICD-10-CM | POA: Diagnosis not present

## 2022-01-23 DIAGNOSIS — F329 Major depressive disorder, single episode, unspecified: Secondary | ICD-10-CM | POA: Diagnosis not present

## 2022-01-23 DIAGNOSIS — Z95828 Presence of other vascular implants and grafts: Secondary | ICD-10-CM | POA: Diagnosis not present

## 2022-01-23 DIAGNOSIS — G809 Cerebral palsy, unspecified: Secondary | ICD-10-CM | POA: Diagnosis not present

## 2022-02-09 DIAGNOSIS — M24542 Contracture, left hand: Secondary | ICD-10-CM | POA: Diagnosis not present

## 2022-02-09 DIAGNOSIS — Z466 Encounter for fitting and adjustment of urinary device: Secondary | ICD-10-CM | POA: Diagnosis not present

## 2022-02-09 DIAGNOSIS — F329 Major depressive disorder, single episode, unspecified: Secondary | ICD-10-CM | POA: Diagnosis not present

## 2022-02-09 DIAGNOSIS — N319 Neuromuscular dysfunction of bladder, unspecified: Secondary | ICD-10-CM | POA: Diagnosis not present

## 2022-02-09 DIAGNOSIS — G809 Cerebral palsy, unspecified: Secondary | ICD-10-CM | POA: Diagnosis not present

## 2022-02-09 DIAGNOSIS — Z435 Encounter for attention to cystostomy: Secondary | ICD-10-CM | POA: Diagnosis not present

## 2022-02-16 ENCOUNTER — Ambulatory Visit: Payer: Medicare Other

## 2022-02-19 ENCOUNTER — Ambulatory Visit: Payer: Medicare Other

## 2022-02-22 DIAGNOSIS — Z466 Encounter for fitting and adjustment of urinary device: Secondary | ICD-10-CM | POA: Diagnosis not present

## 2022-02-22 DIAGNOSIS — G809 Cerebral palsy, unspecified: Secondary | ICD-10-CM | POA: Diagnosis not present

## 2022-02-22 DIAGNOSIS — Z95828 Presence of other vascular implants and grafts: Secondary | ICD-10-CM | POA: Diagnosis not present

## 2022-02-22 DIAGNOSIS — M24522 Contracture, left elbow: Secondary | ICD-10-CM | POA: Diagnosis not present

## 2022-02-22 DIAGNOSIS — Z86718 Personal history of other venous thrombosis and embolism: Secondary | ICD-10-CM | POA: Diagnosis not present

## 2022-02-22 DIAGNOSIS — Z7901 Long term (current) use of anticoagulants: Secondary | ICD-10-CM | POA: Diagnosis not present

## 2022-02-22 DIAGNOSIS — K219 Gastro-esophageal reflux disease without esophagitis: Secondary | ICD-10-CM | POA: Diagnosis not present

## 2022-02-22 DIAGNOSIS — M24542 Contracture, left hand: Secondary | ICD-10-CM | POA: Diagnosis not present

## 2022-02-22 DIAGNOSIS — F329 Major depressive disorder, single episode, unspecified: Secondary | ICD-10-CM | POA: Diagnosis not present

## 2022-02-22 DIAGNOSIS — E785 Hyperlipidemia, unspecified: Secondary | ICD-10-CM | POA: Diagnosis not present

## 2022-02-22 DIAGNOSIS — N319 Neuromuscular dysfunction of bladder, unspecified: Secondary | ICD-10-CM | POA: Diagnosis not present

## 2022-02-22 DIAGNOSIS — Z435 Encounter for attention to cystostomy: Secondary | ICD-10-CM | POA: Diagnosis not present

## 2022-02-23 ENCOUNTER — Ambulatory Visit (INDEPENDENT_AMBULATORY_CARE_PROVIDER_SITE_OTHER): Payer: Medicare Other

## 2022-02-23 DIAGNOSIS — Z23 Encounter for immunization: Secondary | ICD-10-CM

## 2022-02-26 ENCOUNTER — Ambulatory Visit (INDEPENDENT_AMBULATORY_CARE_PROVIDER_SITE_OTHER): Payer: Medicare Other

## 2022-02-26 DIAGNOSIS — Z0000 Encounter for general adult medical examination without abnormal findings: Secondary | ICD-10-CM | POA: Diagnosis not present

## 2022-02-26 NOTE — Progress Notes (Signed)
Subjective:  I connected with  [name] on 02/26/22 by a audio enabled telemedicine application and verified that I am speaking with the correct person using two identifiers.  Patient Location: Home  Provider Location: Office/Clinic  I discussed the limitations of evaluation and management by telemedicine. The patient expressed understanding and agreed to proceed.  ESHAN TRUPIANO is a 66 y.o. male who presents for Medicare Annual/Subsequent preventive examination.  Review of Systems    Defer to PCP       Objective:    There were no vitals filed for this visit. There is no height or weight on file to calculate BMI.     02/18/2021    9:03 AM 02/08/2020    9:34 AM 08/05/2018   11:42 AM 12/27/2017   10:21 AM 10/28/2017    9:38 AM 08/24/2017   11:04 AM 07/07/2017    1:19 PM  Advanced Directives  Does Patient Have a Medical Advance Directive? No No Yes No No No Yes  Type of Advance Directive   Healthcare Power of [place]  Does patient want to make changes to medical advance directive?     No - Patient declined No - Patient declined No - Patient declined  Copy of [place] in Chart?   Yes - validated most recent copy scanned in chart (See row information)  No - copy requested No - copy requested No - copy requested  Would patient like information on creating a medical advance directive?  No - Patient declined  No - Patient declined No - Patient declined No - Patient declined No - Patient declined    Current Medications (verified) Outpatient Encounter Medications as of 02/26/2022  Medication Sig   acetaminophen (TYLENOL) 325 MG tablet Take 1 tablet (325 mg total) by mouth every 6 (six) hours as needed for mild pain (or Fever >/= 101).   ARIPiprazole (ABILIFY) 15 MG tablet TAKE 1 TABLET BY MOUTH ONCE DAILY   citalopram (CELEXA) 20 MG tablet TAKE 1 TABLET BY MOUTH ONCE DAILY   fluticasone (FLONASE) 50 MCG/ACT nasal spray Place into  both nostrils daily.   nystatin cream (MYCOSTATIN) APPLY TOPICALLY 3 TIMES DAILY TO AFFECTED AREA   oxybutynin (DITROPAN) 5 MG tablet Take 1 tablet (5 mg total) by mouth 2 (two) times daily.   pantoprazole (PROTONIX) 40 MG tablet TAKE 1 TABLET BY MOUTH ONCE DAILY   rosuvastatin (CRESTOR) 5 MG tablet Take 1 tablet (5 mg total) by mouth daily.   tizanidine (ZANAFLEX) 2 MG capsule Take 1 capsule (2 mg total) by mouth 3 (three) times daily.   XARELTO 10 MG TABS tablet TAKE 1 TABLET BY MOUTH ONCE DAILY   Facility-Administered Encounter Medications as of 02/26/2022  Medication   lidocaine (XYLOCAINE) 2 % jelly 1 application    Allergies (verified) Macrobid [nitrofurantoin], Penicillins, and Sulfa antibiotics   History: Past Medical History:  Diagnosis Date   Cerebral palsy (HCC)    Depression    GERD (gastroesophageal reflux disease)    History of DVT of lower extremity    Mood disorder (HCC)    Neurogenic bladder    Urinary incontinence    Past Surgical History:  Procedure Laterality Date   COLONOSCOPY WITH PROPOFOL N/A 12/27/2017   Procedure: COLONOSCOPY WITH PROPOFOL;  Surgeon: Lin Landsman, MD;  Location: [facility];  Service: Gastroenterology;  Laterality: N/A;   ESOPHAGOGASTRODUODENOSCOPY Left 02/07/2017   Procedure: ESOPHAGOGASTRODUODENOSCOPY (EGD);  Surgeon: Tarri Glenn,  Joelene Millin, MD;  Location: [place];  Service: Gastroenterology;  Laterality: Left;   ESOPHAGOGASTRODUODENOSCOPY (EGD) WITH PROPOFOL  12/27/2017   Procedure: ESOPHAGOGASTRODUODENOSCOPY (EGD) WITH PROPOFOL;  Surgeon: Lin Landsman, MD;  Location: [facility];  Service: Gastroenterology;;   filter surgery for blood clot  2013   HIP FRACTURE SURGERY  2013   IR CATHETER TUBE CHANGE  07/07/2017   Family History  Problem Relation Age of Onset   Heart disease Mother    Atrial fibrillation Mother    Diabetes Mother        Borderline   Heart disease Father        Passed away of heart attack    Kidney disease Neg Hx    Prostate cancer Neg Hx    Social History   Socioeconomic History   Marital status: Single    Spouse name: Not on file   Number of children: Not on file   Years of education: Not on file   Highest education level: Not on file  Occupational History   Occupation: disabled  Tobacco Use   Smoking status: Never    Passive exposure: Never   Smokeless tobacco: Never  Vaping Use   Vaping Use: Never used  Substance and Sexual Activity   Alcohol use: No    Alcohol/week: 0.0 standard drinks of alcohol   Drug use: No   Sexual activity: Never  Other Topics Concern   Not on file  Social History Narrative   Not on file   Social Determinants of Health   Financial Resource Strain: Low Risk  (02/18/2021)   Overall Financial Resource Strain (CARDIA)    Difficulty of Paying Living Expenses: Not hard at all  Food Insecurity: No Food Insecurity (02/18/2021)   Hunger Vital Sign    Worried About Running Out of Food in the Last Year: Never true    Ran Out of Food in the Last Year: Never true  Transportation Needs: No Transportation Needs (05/06/2021)   PRAPARE - Hydrologist (Medical): No    Lack of Transportation (Non-Medical): No  Physical Activity: Inactive (02/18/2021)   Exercise Vital Sign    Days of Exercise per Week: 0 days    Minutes of Exercise per Session: 0 min  Stress: No Stress Concern Present (02/18/2021)   Anchor    Feeling of Stress : Only a little  Social Connections: Socially Isolated (02/18/2021)   Social Connection and Isolation Panel [NHANES]    Frequency of Communication with Friends and Family: More than three times a week    Frequency of Social Gatherings with Friends and Family: More than three times a week    Attends Religious Services: Never    Marine scientist or Organizations: No    Attends Archivist Meetings: Never     Marital Status: Never married    Tobacco Counseling Counseling given: Not Answered   Clinical Intake:                 Diabetic?No         Activities of Daily Living    10/06/2021    2:34 PM 04/28/2021   11:08 AM  In your present state of health, do you have any difficulty performing the following activities:  Hearing? 0 0  Vision? 0 0  Difficulty concentrating or making decisions? 1 0  Walking or climbing stairs? 1 1  Dressing or bathing? 1 1  Doing errands, shopping? 1 1    Patient Care Team: Delsa Grana, PA-C as PCP - General (Family Medicine) Nickie Retort, MD (Inactive) as Consulting Physician (Urology)  Indicate any recent Medical Services you may have received from other than [facility] providers in the past year (date may be approximate).     Assessment:   This is a routine wellness examination for Edan.  Hearing/Vision screen No results found.  Dietary issues and exercise activities discussed:     Goals Addressed   None   Depression Screen    10/06/2021    2:32 PM 04/28/2021   11:07 AM 02/18/2021    9:02 AM 10/22/2020    9:13 AM 08/22/2020   10:53 AM 04/10/2020   10:50 AM 02/22/2020    1:18 PM  PHQ 2/9 Scores  PHQ - 2 Score 2 6 0 2 0 0 0  PHQ- 9 Score '2 27  5 '$ 0  0    Fall Risk    10/06/2021    2:32 PM 04/28/2021   11:07 AM 02/18/2021    9:04 AM 10/22/2020    9:13 AM 08/22/2020   10:53 AM  Fall Risk   Falls in the past year? 0 0 0 0 0  Number falls in past yr: 0 0 0 0 0  Injury with Fall? 0 0 0 0 0  Risk for fall due to : Impaired mobility;Impaired balance/gait  Impaired balance/gait;Impaired mobility    Follow up Falls prevention discussed  Falls prevention discussed      FALL RISK PREVENTION PERTAINING TO THE HOME:  Any stairs in or around the home? No  If so, are there any without handrails? No  Home free of loose throw rugs in walkways, pet beds, electrical cords, etc? Yes  Adequate lighting in your home to reduce risk of  falls? Yes   ASSISTIVE DEVICES UTILIZED TO PREVENT FALLS:  Life alert? Yes  Use of a cane, walker or w/c? Yes  Grab bars in the bathroom? Yes  Shower chair or bench in shower? Yes  Elevated toilet seat or a handicapped toilet? Yes   TIMED UP AND GO:  Was the test performed? No .  Length of time to ambulate 10 feet: N/A sec.     Cognitive Function:        Immunizations Immunization History  Administered Date(s) Administered   Fluad Quad(high Dose 65+) 02/23/2022   Influenza,inj,Quad PF,6+ Mos 03/04/2016, 02/06/2017, 02/18/2018, 01/26/2019, 03/11/2020    TDAP status: Due, Education has been provided regarding the importance of this vaccine. Advised may receive this vaccine at local pharmacy or Health Dept. Aware to provide a copy of the vaccination record if obtained from local pharmacy or Health Dept. Verbalized acceptance and understanding.  Flu Vaccine status: Up to date  Pneumococcal vaccine status: Due, Education has been provided regarding the importance of this vaccine. Advised may receive this vaccine at local pharmacy or Health Dept. Aware to provide a copy of the vaccination record if obtained from local pharmacy or Health Dept. Verbalized acceptance and understanding.  Covid-19 vaccine status: Declined, Education has been provided regarding the importance of this vaccine but patient still declined. Advised may receive this vaccine at local pharmacy or Health Dept.or vaccine clinic. Aware to provide a copy of the vaccination record if obtained from local pharmacy or Health Dept. Verbalized acceptance and understanding.  Qualifies for Shingles Vaccine? Yes   Zostavax completed No   Shingrix Completed?: No.    Education has been provided  regarding the importance of this vaccine. Patient has been advised to call insurance company to determine out of pocket expense if they have not yet received this vaccine. Advised may also receive vaccine at local pharmacy or Health Dept.  Verbalized acceptance and understanding.  Screening Tests Health Maintenance  Topic Date Due   COVID-19 Vaccine (1) Never done   Hepatitis C Screening  Never done   Zoster Vaccines- Shingrix (1 of 2) Never done   Pneumonia Vaccine 58+ Years old (1 - PCV) 01/28/2022   TETANUS/TDAP  10/07/2023 (Originally 01/29/1975)   COLONOSCOPY (Pts 45-32yr Insurance coverage will need to be confirmed)  12/28/2027   INFLUENZA VACCINE  Completed   HIV Screening  Completed   HPV VACCINES  Aged Out    Health Maintenance  Health Maintenance Due  Topic Date Due   COVID-19 Vaccine (1) Never done   Hepatitis C Screening  Never done   Zoster Vaccines- Shingrix (1 of 2) Never done   Pneumonia Vaccine 65 Years old (1 - PCV) 01/28/2022    Colorectal cancer screening: Type of screening: Colonoscopy. Completed 01/27/2018. Repeat every 10 years  Lung Cancer Screening: (Low Dose CT Chest recommended if Age 372-80years, 30 pack-year currently smoking OR have quit w/in 15years.) does not qualify.   Lung Cancer Screening Referral: n/a  Additional Screening:  Hepatitis C Screening: does not qualify; Completed n/a  Vision Screening: Recommended annual ophthalmology exams for early detection of glaucoma and other disorders of the eye. Is the patient up to date with their annual eye exam?   Who is the No provider or what is the name of the office in which the patient attends annual eye exams? N/a If pt is not established with a provider, would they like to be referred to a provider to establish care? No .   Dental Screening: Recommended annual dental exams for proper oral hygiene  Community Resource Referral / Chronic Care Management: CRR required this visit?  No   CCM required this visit?  No      Plan:     I have personally reviewed and noted the following in the patient's chart:   Medical and social history Use of alcohol, tobacco or illicit drugs  Current medications and supplements including  opioid prescriptions. Patient is not currently taking opioid prescriptions. Functional ability and status Nutritional status Physical activity Advanced directives List of other physicians Hospitalizations, surgeries, and ER visits in previous 12 months Vitals Screenings to include cognitive, depression, and falls Referrals and appointments  In addition, I have reviewed and discussed with patient certain preventive protocols, quality metrics, and best practice recommendations. A written personalized care plan for preventive services as well as general preventive health recommendations were provided to patient.     SRoyal Hawthorn [place]  02/26/2022   Nurse Notes:   Mr. GFuston, Thank you for taking time to come for your Medicare Wellness Visit. I appreciate your ongoing commitment to your health goals. Please review the following plan we discussed and let me know if I can assist you in the future.   These are the goals we discussed:  Goals      Find Help in My Community     Timeframe:  Long-Range Goal Priority:  Medium Start Date:   08/22/21                          Expected End Date:  10/24/21  Follow Up Date    - begin a notebook of services in my neighborhood or community - call 211 when I need some help - follow-up on any referrals for help I am given - think ahead to make sure my need does not become an emergency - have a back-up plan  -follow up with [facility] regarding paperwork for wheelchair ramp-contact-Shaniece Ilda Foil [phone] assistant Bethena Roys [phone] assistant Sharyn Lull [phone]-confirmed that paperwork was completed and returned -follow up with patient's CAP worker regarding full guardianship status   Why is this important?   Knowing how and where to find help for yourself or family in your neighborhood and community is an important skill.  You will want to take some steps to learn how.    Notes:         This is a list of the  screening recommended for you and due dates:  Health Maintenance  Topic Date Due   COVID-19 Vaccine (1) Never done   Hepatitis C Screening: USPSTF Recommendation to screen - Ages 37-79 yo.  Never done   Zoster (Shingles) Vaccine (1 of 2) Never done   Pneumonia Vaccine (1 - PCV) 01/28/2022   Tetanus Vaccine  10/07/2023*   Colon Cancer Screening  12/28/2027   Flu Shot  Completed   HIV Screening  Completed   HPV Vaccine  Aged Out  *Topic was postponed. The date shown is not the original due date.

## 2022-03-02 DIAGNOSIS — F329 Major depressive disorder, single episode, unspecified: Secondary | ICD-10-CM | POA: Diagnosis not present

## 2022-03-02 DIAGNOSIS — Z435 Encounter for attention to cystostomy: Secondary | ICD-10-CM | POA: Diagnosis not present

## 2022-03-02 DIAGNOSIS — Z466 Encounter for fitting and adjustment of urinary device: Secondary | ICD-10-CM | POA: Diagnosis not present

## 2022-03-02 DIAGNOSIS — N319 Neuromuscular dysfunction of bladder, unspecified: Secondary | ICD-10-CM | POA: Diagnosis not present

## 2022-03-02 DIAGNOSIS — M24542 Contracture, left hand: Secondary | ICD-10-CM | POA: Diagnosis not present

## 2022-03-02 DIAGNOSIS — G809 Cerebral palsy, unspecified: Secondary | ICD-10-CM | POA: Diagnosis not present

## 2022-03-10 ENCOUNTER — Other Ambulatory Visit: Payer: Medicare Other | Admitting: Student

## 2022-03-10 DIAGNOSIS — Z515 Encounter for palliative care: Secondary | ICD-10-CM | POA: Diagnosis not present

## 2022-03-10 DIAGNOSIS — N319 Neuromuscular dysfunction of bladder, unspecified: Secondary | ICD-10-CM | POA: Diagnosis not present

## 2022-03-10 DIAGNOSIS — J302 Other seasonal allergic rhinitis: Secondary | ICD-10-CM | POA: Diagnosis not present

## 2022-03-10 DIAGNOSIS — G809 Cerebral palsy, unspecified: Secondary | ICD-10-CM

## 2022-03-10 DIAGNOSIS — Z7189 Other specified counseling: Secondary | ICD-10-CM | POA: Diagnosis not present

## 2022-03-10 NOTE — Progress Notes (Signed)
[facility] Pall[facility] Consult Note Telephone: [phone]  Fax: [phone]    Date of encounter: 03/10/22 12:46 PM PATIENT NAME: Gerald Montgomery [address] [place] [address]   [phone] (home) [phone] (work) DOB: 1955/09/15 MRN: [id number] PRIMARY CARE PROVIDER:    Laurell Roof  [address] [place] [address] [phone]  REFERRING PROVIDER:   Delsa Grana, PA-C [address] [place],  [place] [address] [phone]  RESPONSIBLE PARTY:    Contact Information     Name Relation Home Work Mobile   [place] Sister   [phone]        I met face to face with patient and family in the home. Palliative Care was asked to follow this patient by consultation request of  Delsa Grana, PA-C to address advance care planning and complex medical decision making. This is a follow up visit.                                   ASSESSMENT AND PLAN / RECOMMENDATIONS:   Advance Care Planning/Goals of Care: Goals include to maximize quality of life and symptom management. Patient/health care surrogate gave his/her permission to discuss. Our advance care planning conversation included a discussion about:    The value and importance of advance care planning  Experiences with loved ones who have been seriously ill or have died  Exploration of personal, cultural or spiritual beliefs that might influence medical decisions  Exploration of goals of care in the event of a sudden injury or illness  healthcare agent- sister [name]  CODE STATUS: Full Code  Education provided on Palliative Medicine. We discussed hospice eligibility criteria. Sister [name] is to notify Palliative, should he exhibit any signs of decline.  Symptom Management/Plan:  ACP-discussed goals of care. Patient to remain in the home with caregiver assistance. MOST form completed today; Attempt CPR, Full Scope of Treatment,  antibiotics and IV fluids as indicated, feeding tube for a defined trial period.   Cerebral Palsy-patient is dependent for all adl's. He is eating well; actual weight gain noted as clothes are fitting tighter. Skin is intact; monitor for skin breakdown. Continue using butt paste or Terrasil for redness to buttocks. Turn and reposition every 2 hours. He is out of bed as tolerated to w/c. Recommend getting patient out of bed 2-3 times a week. Monitor for worsening contractures or dysphagia.   Neurogenic bladder-has suprapubic catheter in place; changed out every 3-4 weeks per [facility]. Monitor for patency, complications. Continue oxybutynin for bladder spasms.  Allergies-continue cetirizine, Flonase, budesonide as directed. Adequate fluids encouraged.   Follow up Palliative Care Visit: Palliative care will continue to follow for complex medical decision making, advance care planning, and clarification of goals. Return prn.  I spent 60 minutes providing this consultation. More than 50% of the time in this consultation was spent in counseling and care coordination.   PPS: 30%  HOSPICE ELIGIBILITY/DIAGNOSIS: TBD  Chief Complaint: Palliative medicine follow up visits.  HISTORY OF PRESENT ILLNESS:  Gerald Montgomery is a 66 y.o. year old male  with Cerebral palsy, hyperlipidemia, major depressive disorder, iron deficiency anemia, GERD, neurogenic bladder, super pubic catheter, history of DVT.     Patient resides at home; family is caring for patient. Sister [name] may move patient back to her home once ramp is built. Mechanical lift is functioning again; fuse  was replaced. Patient reports doing well. He is getting out of bed to w/c 2 times a week. He denies pain, shortness of breath, nausea. He is eating good; no worsening dysphagia. No skin breakdown present. Terrasil or butt past being applied to buttocks for redness. Patient's allergies have worsened with change in season.  Patient received  resting in bed; incontinence care being provided by sister. Catheter drainage bag with clear, yellow urine. Patient with pleasant affect. He does answer direct questions.   History obtained from review of EMR, discussion with primary team, and interview with family, facility staff/caregiver and/or Mr. Kimball.  I reviewed available labs, medications, imaging, studies and related documents from the EMR.  Records reviewed and summarized above.   ROS  HPI and ROS primarily obtained from sister.   Physical Exam: Pulse 92, resp 16, b/p 138/90, sats 98% on room air Constitutional: NAD General: frail appearing EYES: anicteric sclera, lids intact, no discharge  ENMT: intact hearing, oral mucous membranes moist CV: S1S2, RRR, no LE edema Pulmonary: LCTA, no increased work of breathing, no cough, room air Abdomen: normo-active BS + 4 quadrants, soft and non tender, suprapubic catheter GU: deferred MSK: left wrist contracture, left leg/knee contracture, non-ambulatory Skin: warm and dry, no rashes or wounds on visible skin Neuro: +generalized weakness,  + cognitive impairment Psych: non-anxious affect, A and O x 2 Hem/lymph/immuno: no widespread bruising   Thank you for the opportunity to participate in the care of Mr. Quintero.  The palliative care team will continue to follow. Please call our office at [phone] if we can be of additional assistance.   Ezekiel Slocumb, NP   COVID-19 PATIENT SCREENING TOOL Asked and negative response unless otherwise noted:   Have you had symptoms of covid, tested positive or been in contact with someone with symptoms/positive test in the past 5-10 days? No

## 2022-03-19 ENCOUNTER — Encounter: Payer: Self-pay | Admitting: Family Medicine

## 2022-03-19 ENCOUNTER — Telehealth: Payer: Self-pay | Admitting: Family Medicine

## 2022-03-19 ENCOUNTER — Other Ambulatory Visit: Payer: Self-pay | Admitting: Family Medicine

## 2022-03-19 ENCOUNTER — Telehealth: Payer: Self-pay | Admitting: Nurse Practitioner

## 2022-03-19 DIAGNOSIS — Z86718 Personal history of other venous thrombosis and embolism: Secondary | ICD-10-CM

## 2022-03-19 DIAGNOSIS — Z789 Other specified health status: Secondary | ICD-10-CM | POA: Insufficient documentation

## 2022-03-19 DIAGNOSIS — F39 Unspecified mood [affective] disorder: Secondary | ICD-10-CM

## 2022-03-19 DIAGNOSIS — Z743 Need for continuous supervision: Secondary | ICD-10-CM | POA: Insufficient documentation

## 2022-03-19 NOTE — Progress Notes (Signed)
No in person encounters for about a year and a half, labs for many meds and conditions not done for 18 months to 2 years + Will need to see if palliative care of [facility] could get labs or what we can do to get him into office for in person visit/exam/labs to safely be able to continue to care for him and refill meds etc.   Will be consulting with SP Dr. Ancil Boozer. The recent palliative care consult notes in home were reviewed today.  See other encounter for med refill request  Delsa Grana, PA-C

## 2022-03-19 NOTE — Telephone Encounter (Signed)
Spoke with care taker and she will call back to schedule the appointment stated that she would have to look at the weather that she did not want to bring him out in the cold

## 2022-03-19 NOTE — Telephone Encounter (Signed)
Sister called and needs this refilled as well  citalopram (CELEXA) 20 MG tablet

## 2022-03-19 NOTE — Telephone Encounter (Signed)
Requested medication (s) are due for refill today - yes  Requested medication (s) are on the active medication list -yes  Future visit scheduled -no  Last refill: 12/19/21 #90  Notes to clinic: fails lab protocol- over 1 year  Requested Prescriptions  Pending Prescriptions Disp Refills   XARELTO 10 MG TABS tablet [Pharmacy Med Name: XARELTO 10 MG TAB] 90 tablet 0    Sig: TAKE 1 TABLET BY MOUTH ONCE DAILY     Hematology: Anticoagulants - rivaroxaban Failed - 03/19/2022 10:40 AM      Failed - ALT in normal range and within 360 days    ALT  Date Value Ref Range Status  10/22/2020 25 9 - 46 U/L Final   SGPT (ALT)  Date Value Ref Range Status  01/25/2014 24 U/L Final    Comment:    14-63 NOTE: New Reference Range 12/12/13          Failed - AST in normal range and within 360 days    AST  Date Value Ref Range Status  10/22/2020 22 10 - 35 U/L Final   SGOT(AST)  Date Value Ref Range Status  01/25/2014 14 (L) 15 - 37 Unit/L Final         Failed - Cr in normal range and within 360 days    Creat  Date Value Ref Range Status  10/22/2020 0.62 (L) 0.70 - 1.25 mg/dL Final    Comment:    For patients >65 years of age, the reference limit for Creatinine is approximately 13% higher for people identified as African-American. .          Failed - HCT in normal range and within 360 days    HCT  Date Value Ref Range Status  04/10/2020 40.7 38.5 - 50.0 % Final   Hematocrit  Date Value Ref Range Status  10/07/2015 40.9 37.5 - 51.0 % Final         Failed - HGB in normal range and within 360 days    Hemoglobin  Date Value Ref Range Status  04/10/2020 13.4 13.2 - 17.1 g/dL Final  10/07/2015 13.9 12.6 - 17.7 g/dL Final         Failed - PLT in normal range and within 360 days    Platelets  Date Value Ref Range Status  04/10/2020 195 140 - 400 Thousand/uL Final  10/07/2015 180 150 - 379 x10E3/uL Final         Failed - eGFR is 15 or above and within 360 days    GFR, Est  African American  Date Value Ref Range Status  04/10/2020 126 > OR = 60 mL/min/1.93m Final   GFR, Est Non African American  Date Value Ref Range Status  04/10/2020 109 > OR = 60 mL/min/1.732mFinal         Passed - Patient is not pregnant      Passed - Valid encounter within last 12 months    Recent Outpatient Visits           5 months [facility]   10 months [facility], [facility]  [place]   1 year ago No-show for appointment   [facility]   1 year ago Medication monitoring encounter   [facility]   [place] ago [place]  [facility] Grana, [facility]-C                 Requested Prescriptions  Pending Prescriptions Disp Refills   XARELTO 10 MG TABS tablet [Pharmacy Med Name: XARELTO 10 MG TAB] 90 tablet 0    Sig: TAKE 1 TABLET BY MOUTH ONCE DAILY     Hematology: Anticoagulants - rivaroxaban Failed - 03/19/2022 10:40 AM      Failed - ALT in normal range and within 360 days    ALT  Date Value Ref Range Status  10/22/2020 25 9 - 46 U/L Final   SGPT (ALT)  Date Value Ref Range Status  01/25/2014 24 U/L Final    Comment:    14-63 NOTE: New Reference Range 12/12/13          Failed - AST in normal range and within 360 days    AST  Date Value Ref Range Status  10/22/2020 22 10 - 35 U/L Final   SGOT(AST)  Date Value Ref Range Status  01/25/2014 14 (L) 15 - 37 Unit/L Final         Failed - Cr in normal range and within 360 days    Creat  Date Value Ref Range Status  10/22/2020 0.62 (L) 0.70 - 1.25 mg/dL Final    Comment:    For patients >13 years of age, the reference limit for Creatinine is approximately 13% higher for people identified as African-American. .          Failed - HCT in normal range and  within 360 days    HCT  Date Value Ref Range Status  04/10/2020 40.7 38.5 - 50.0 % Final   Hematocrit  Date Value Ref Range Status  10/07/2015 40.9 37.5 - 51.0 % Final         Failed - HGB in normal range and within 360 days    Hemoglobin  Date Value Ref Range Status  04/10/2020 13.4 13.2 - 17.1 g/dL Final  10/07/2015 13.9 12.6 - 17.7 g/dL Final         Failed - PLT in normal range and within 360 days    Platelets  Date Value Ref Range Status  04/10/2020 195 140 - 400 Thousand/uL Final  10/07/2015 180 150 - 379 x10E3/uL Final         Failed - eGFR is 15 or above and within 360 days    GFR, Est African American  Date Value Ref Range Status  04/10/2020 126 > OR = 60 mL/min/1.55m Final   GFR, Est Non African American  Date Value Ref Range Status  04/10/2020 109 > OR = 60 mL/min/1.734mFinal         Passed - Patient is not pregnant      Passed - Valid encounter within last 12 months    Recent Outpatient Visits           5 months ago [facility], [facility]   10 months ago [facility], [facility]  [place]   1 year ago No-show for appointment   [facility]   1 year ago Medication monitoring encounter   [facility]   [facility] [place]

## 2022-03-20 NOTE — Telephone Encounter (Signed)
Called and spoke to someone and they stated they will have to call back. They are trying to get a PA to start coming out to see him because it cost a lot to get him here and she stated with cold weather coming they cant bring him out.

## 2022-03-23 DIAGNOSIS — N319 Neuromuscular dysfunction of bladder, unspecified: Secondary | ICD-10-CM | POA: Diagnosis not present

## 2022-03-23 DIAGNOSIS — Z466 Encounter for fitting and adjustment of urinary device: Secondary | ICD-10-CM | POA: Diagnosis not present

## 2022-03-23 DIAGNOSIS — M24542 Contracture, left hand: Secondary | ICD-10-CM | POA: Diagnosis not present

## 2022-03-23 DIAGNOSIS — G809 Cerebral palsy, unspecified: Secondary | ICD-10-CM | POA: Diagnosis not present

## 2022-03-23 DIAGNOSIS — Z435 Encounter for attention to cystostomy: Secondary | ICD-10-CM | POA: Diagnosis not present

## 2022-03-23 DIAGNOSIS — F329 Major depressive disorder, single episode, unspecified: Secondary | ICD-10-CM | POA: Diagnosis not present

## 2022-03-24 DIAGNOSIS — M24522 Contracture, left elbow: Secondary | ICD-10-CM | POA: Diagnosis not present

## 2022-03-24 DIAGNOSIS — Z435 Encounter for attention to cystostomy: Secondary | ICD-10-CM | POA: Diagnosis not present

## 2022-03-24 DIAGNOSIS — Z95828 Presence of other vascular implants and grafts: Secondary | ICD-10-CM | POA: Diagnosis not present

## 2022-03-24 DIAGNOSIS — M24542 Contracture, left hand: Secondary | ICD-10-CM | POA: Diagnosis not present

## 2022-03-24 DIAGNOSIS — N319 Neuromuscular dysfunction of bladder, unspecified: Secondary | ICD-10-CM | POA: Diagnosis not present

## 2022-03-24 DIAGNOSIS — Z86718 Personal history of other venous thrombosis and embolism: Secondary | ICD-10-CM | POA: Diagnosis not present

## 2022-03-24 DIAGNOSIS — E785 Hyperlipidemia, unspecified: Secondary | ICD-10-CM | POA: Diagnosis not present

## 2022-03-24 DIAGNOSIS — Z466 Encounter for fitting and adjustment of urinary device: Secondary | ICD-10-CM | POA: Diagnosis not present

## 2022-03-24 DIAGNOSIS — F329 Major depressive disorder, single episode, unspecified: Secondary | ICD-10-CM | POA: Diagnosis not present

## 2022-03-24 DIAGNOSIS — K219 Gastro-esophageal reflux disease without esophagitis: Secondary | ICD-10-CM | POA: Diagnosis not present

## 2022-03-24 DIAGNOSIS — Z7901 Long term (current) use of anticoagulants: Secondary | ICD-10-CM | POA: Diagnosis not present

## 2022-03-24 DIAGNOSIS — G809 Cerebral palsy, unspecified: Secondary | ICD-10-CM | POA: Diagnosis not present

## 2022-03-26 ENCOUNTER — Ambulatory Visit (INDEPENDENT_AMBULATORY_CARE_PROVIDER_SITE_OTHER): Payer: Medicare Other | Admitting: Family Medicine

## 2022-03-26 ENCOUNTER — Encounter: Payer: Self-pay | Admitting: Family Medicine

## 2022-03-26 VITALS — BP 130/76 | HR 80 | Temp 97.5°F | Resp 16 | Wt 167.6 lb

## 2022-03-26 DIAGNOSIS — J309 Allergic rhinitis, unspecified: Secondary | ICD-10-CM

## 2022-03-26 DIAGNOSIS — F331 Major depressive disorder, recurrent, moderate: Secondary | ICD-10-CM

## 2022-03-26 DIAGNOSIS — Z7901 Long term (current) use of anticoagulants: Secondary | ICD-10-CM | POA: Diagnosis not present

## 2022-03-26 DIAGNOSIS — Z5181 Encounter for therapeutic drug level monitoring: Secondary | ICD-10-CM

## 2022-03-26 DIAGNOSIS — E7800 Pure hypercholesterolemia, unspecified: Secondary | ICD-10-CM

## 2022-03-26 DIAGNOSIS — F39 Unspecified mood [affective] disorder: Secondary | ICD-10-CM

## 2022-03-26 DIAGNOSIS — Z9359 Other cystostomy status: Secondary | ICD-10-CM

## 2022-03-26 DIAGNOSIS — Z1159 Encounter for screening for other viral diseases: Secondary | ICD-10-CM

## 2022-03-26 DIAGNOSIS — K219 Gastro-esophageal reflux disease without esophagitis: Secondary | ICD-10-CM

## 2022-03-26 DIAGNOSIS — Z86718 Personal history of other venous thrombosis and embolism: Secondary | ICD-10-CM

## 2022-03-26 DIAGNOSIS — E785 Hyperlipidemia, unspecified: Secondary | ICD-10-CM | POA: Diagnosis not present

## 2022-03-26 DIAGNOSIS — Z743 Need for continuous supervision: Secondary | ICD-10-CM

## 2022-03-26 DIAGNOSIS — Z23 Encounter for immunization: Secondary | ICD-10-CM

## 2022-03-26 DIAGNOSIS — D509 Iron deficiency anemia, unspecified: Secondary | ICD-10-CM | POA: Diagnosis not present

## 2022-03-26 DIAGNOSIS — G809 Cerebral palsy, unspecified: Secondary | ICD-10-CM

## 2022-03-26 MED ORDER — BACLOFEN 5 MG PO TABS: 5.0000 mg | ORAL_TABLET | Freq: Three times a day (TID) | ORAL | 3 refills | Status: DC | PRN

## 2022-03-26 MED ORDER — CITALOPRAM HYDROBROMIDE 20 MG PO TABS: 20.0000 mg | ORAL_TABLET | Freq: Every day | ORAL | 3 refills | Status: DC

## 2022-03-26 MED ORDER — ARIPIPRAZOLE 15 MG PO TABS: 15.0000 mg | ORAL_TABLET | Freq: Every day | ORAL | 3 refills | Status: DC

## 2022-03-26 MED ORDER — ROSUVASTATIN CALCIUM 5 MG PO TABS: 5.0000 mg | ORAL_TABLET | Freq: Every day | ORAL | 3 refills | Status: DC

## 2022-03-26 MED ORDER — PANTOPRAZOLE SODIUM 20 MG PO TBEC: 20.0000 mg | DELAYED_RELEASE_TABLET | Freq: Every day | ORAL | 3 refills | Status: DC

## 2022-03-26 MED ORDER — RIVAROXABAN 10 MG PO TABS: 10.0000 mg | ORAL_TABLET | Freq: Every day | ORAL | 3 refills | Status: DC

## 2022-03-26 NOTE — Assessment & Plan Note (Signed)
Stable, phq 9 and GAD 7 reviewed, continue celexa 20 and abilify

## 2022-03-26 NOTE — Assessment & Plan Note (Addendum)
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

## 2022-03-26 NOTE — Assessment & Plan Note (Signed)
Last labs showed no IDA, recheck labs and update chart, previously was seeing hematology, more than 4 years ago

## 2022-03-26 NOTE — Assessment & Plan Note (Signed)
monitored by urology and [facility] nursing, stable, no recent changes or concerns

## 2022-03-26 NOTE — Assessment & Plan Note (Signed)
Seeing palliative care, previously has had HH PT/OT/nursing and SW consult, currently cared for primarily by sister [name] as personal care aid

## 2022-03-26 NOTE — Progress Notes (Signed)
Name: Gerald Montgomery   MRN: [id number]    DOB: December 04, 1955   Date:03/26/2022       Progress Note  Chief Complaint  Patient presents with   Follow-up   Gastroesophageal Reflux   Hyperlipidemia   Depression     Subjective:   Gerald Montgomery is a 66 y.o. 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, no changes  Prior muscle relaxers helped briefly, but not currently using  MDD on celexa 20 and ability 15-mood/behavior stable    03/26/2022    2:50 PM 02/26/2022    9:40 AM 02/26/2022    9:37 AM  Depression screen PHQ 2/9  Decreased Interest 0 0 0  Down, Depressed, Hopeless 0 0 0  PHQ - 2 Score 0 0 0  Altered sleeping 0    Tired, decreased energy 0    Change in appetite 0    Feeling bad or failure about yourself  0    Trouble concentrating 0    Moving slowly or fidgety/restless 0    Suicidal thoughts 0    PHQ-9 Score 0    Difficult doing work/chores Not difficult at all        10/22/2020    9:13 AM  GAD 7 : Generalized Anxiety Score  Nervous, Anxious, on Edge 2  Control/stop worrying 1  Worry too much - different things 1  Trouble relaxing 1  Restless 0  Easily annoyed or irritable 0  Afraid - awful might happen 1  Total GAD 7 Score 6  Anxiety Difficulty Somewhat difficult   Prior chart hx from 2019 chart by past PCP Dr. Sanda Klein: Mood Disorder: Started by Dr. Rosine Door and abilify dose has been stable for years. Patient and family note great control of behavioral symptoms.     GERD has PPI Rx'd but difficult to say when he is symptomatic, taking protonix seems to help with more  regular BM, he is still having slowed BM, gets bloated/gassy and has some discomfort prior to large BM's no recent blood in stool, prolonged constipation, abdominal pain, weight loss, N/V, dysphagia  Chronic DVT on long term anticoagulant xarelto, prior ED visit with [facility] w/o f/up with GI or labs to ensure stable blood counts.  Palliative care has been to see him in his home recently and noted he was well appearing, no bruising Hx of iron deficiency anemia, last iron panel was normal Past hx from PCP in 2019: DVT: patient still taking xarelto; no leg swelling; stays in wheel chair or gel mattress. DVT was apparently years ago; no hx of PE; DVT was not multiple, no other clots or recurrences; he has not see a hematologist for this  Procedure noted in chart previously of thrombectomy? Vs filter? Previously on coumadin but managed and changed by pcp Dr. Manuella Ghazi in 2018 or so to NOAC Hemoglobin  Date Value Ref Range Status  04/10/2020 13.4 13.2 - 17.1 g/dL Final  10/05/2019 14.5 13.2 - 17.1 g/dL Final  03/21/2019 14.2 13.2 - 17.1 g/dL Final  02/25/2018 15.0 13.0 - 18.0 g/dL Final  10/07/2015 13.9 12.6 - 17.7 g/dL  Final   HGB  Date Value Ref Range Status  01/25/2014 13.4 13.0 - 18.0 g/dL Final  01/11/2012 12.2 (L) 13.0 - 18.0 g/dL Final   Lab Results  Component Value Date   IRON 80 02/25/2018   TIBC 343 02/25/2018   FERRITIN 112 02/25/2018   HLD  on crestor 5 mg- no SE or concerns  Lab Results  Component Value Date   CHOL 131 10/22/2020   HDL 52 10/22/2020   LDLCALC 65 10/22/2020   TRIG 64 10/22/2020   CHOLHDL 2.5 10/22/2020     Current Outpatient Medications:    ARIPiprazole (ABILIFY) 15 MG tablet, TAKE 1 TABLET BY MOUTH ONCE DAILY, Disp: 90 tablet, Rfl: 0   citalopram (CELEXA) 20 MG tablet, TAKE 1 TABLET BY MOUTH ONCE DAILY, Disp: 90 tablet, Rfl: 0   fluticasone (FLONASE) 50 MCG/ACT nasal spray, Place into both nostrils daily., Disp: , Rfl:    oxybutynin (DITROPAN) 5 MG tablet,  Take 1 tablet (5 mg total) by mouth 2 (two) times daily., Disp: 180 tablet, Rfl: 3   pantoprazole (PROTONIX) 40 MG tablet, TAKE 1 TABLET BY MOUTH ONCE DAILY, Disp: 90 tablet, Rfl: 0   rosuvastatin (CRESTOR) 5 MG tablet, Take 1 tablet (5 mg total) by mouth daily., Disp: 90 tablet, Rfl: 3   tizanidine (ZANAFLEX) 2 MG capsule, Take 1 capsule (2 mg total) by mouth 3 (three) times daily., Disp: 30 capsule, Rfl: 0   XARELTO 10 MG TABS tablet, TAKE 1 TABLET BY MOUTH ONCE DAILY, Disp: 90 tablet, Rfl: 0   nystatin cream (MYCOSTATIN), APPLY TOPICALLY 3 TIMES DAILY TO AFFECTED AREA (Patient not taking: Reported on 03/26/2022), Disp: 30 g, Rfl: 1  Current Facility-Administered Medications:    lidocaine (XYLOCAINE) 2 % jelly 1 application, 1 application , Urethral, Once, Stoioff, Ronda Fairly, MD  Patient Active Problem List   Diagnosis Date Noted   Patient is full code 03/19/2022   Requires continuous supervision for activities of daily living (ADL) 03/19/2022   Current moderate episode of major depressive disorder ([place]) 04/10/2020   Suprapubic catheter ([place]) 04/10/2020   Hyperlipidemia, unspecified 03/23/2019   Iron deficiency anemia 08/24/2017   Hypochromic microcytic anemia 08/03/2017   GERD (gastroesophageal reflux disease) 08/03/2017   Need for home health care 06/17/2016   Screening for lipid disorders 03/04/2016   Cerumen impaction 02/12/2016   Cerebral palsy ([place]) 12/05/2015   History of DVT of lower extremity 04/11/2015   Neurogenic bladder 04/11/2015   Monitoring for long-term anticoagulant use 04/11/2015    Past Surgical History:  Procedure Laterality Date   COLONOSCOPY WITH PROPOFOL N/A 12/27/2017   Procedure: COLONOSCOPY WITH PROPOFOL;  Surgeon: Lin Landsman, MD;  Location: [place];  Service: Gastroenterology;  Laterality: N/A;   ESOPHAGOGASTRODUODENOSCOPY Left 02/07/2017   Procedure: ESOPHAGOGASTRODUODENOSCOPY (EGD);  Surgeon: Thornton Park, MD;  Location: [place];   Service: Gastroenterology;  Laterality: Left;   ESOPHAGOGASTRODUODENOSCOPY (EGD) WITH PROPOFOL  12/27/2017   Procedure: ESOPHAGOGASTRODUODENOSCOPY (EGD) WITH PROPOFOL;  Surgeon: Lin Landsman, MD;  Location: [facility];  Service: Gastroenterology;;   filter surgery for blood clot  2013   HIP FRACTURE SURGERY  2013   IR CATHETER TUBE CHANGE  07/07/2017    Family History  Problem Relation Age of Onset   Heart disease Mother    Atrial fibrillation Mother    Diabetes Mother        Borderline   Heart disease Father        Passed away of heart attack  Kidney disease Neg Hx    Prostate cancer Neg Hx     Social History   Tobacco Use   Smoking status: Never    Passive exposure: Never   Smokeless tobacco: Never  Vaping Use   Vaping Use: Never used  Substance Use Topics   Alcohol use: No    Alcohol/week: 0.0 standard drinks of alcohol   Drug use: No     Allergies  Allergen Reactions   Macrobid [Nitrofurantoin]    Penicillins     Sickness/sweating   Sulfa Antibiotics Itching    Health Maintenance  Topic Date Due   Pneumonia Vaccine 102+ Years old (1 - PCV) 02/27/2023 (Originally 01/28/2022)   Hepatitis C Screening  02/27/2023 (Originally 01/29/1974)   TETANUS/TDAP  10/07/2023 (Originally 01/29/1976)   COLONOSCOPY (Pts 45-25yr Insurance coverage will need to be confirmed)  12/28/2027   INFLUENZA VACCINE  Completed   HIV Screening  Completed   HPV VACCINES  Aged Out   COVID-19 Vaccine  Discontinued   Zoster Vaccines- Shingrix  Discontinued    Chart Review Today: I personally reviewed active problem list, medication list, allergies, family history, social history, health maintenance, notes from last encounter, lab results, imaging with the patient/caregiver today.   Review of Systems  Constitutional: Negative.   HENT: Negative.    Eyes: Negative.   Respiratory: Negative.    Cardiovascular: Negative.   Gastrointestinal: Negative.   Endocrine: Negative.    Genitourinary: Negative.   Musculoskeletal: Negative.   Skin: Negative.   Allergic/Immunologic: Negative.   Neurological: Negative.   Hematological: Negative.   Psychiatric/Behavioral: Negative.    All other systems reviewed and are negative.    Objective:   Vitals:   03/26/22 1451  BP: 130/76  Pulse: 80  Resp: 16  Temp: (!) 97.5 F (36.4 C)  TempSrc: Oral  SpO2: 97%  Weight: 167 lb 9.6 oz (76 kg)    Body mass index is 27.05 kg/m.  Physical Exam Vitals and nursing note reviewed.  Constitutional:      General: He is not in acute distress.    Appearance: He is well-groomed and overweight. He is not toxic-appearing or diaphoretic.     Comments: CP with contractures and atrophy to bilateral LE and left arm, pleasant and alert, answers some questions with 1-2 words, slurred/delayed  Cardiovascular:     Rate and Rhythm: Normal rate and regular rhythm.     Pulses: Normal pulses.          Radial pulses are 2+ on the left side.     Heart sounds: Normal heart sounds. Heart sounds not distant. No murmur heard.    No friction rub. No gallop.  Pulmonary:     Effort: No tachypnea, bradypnea, accessory muscle usage, respiratory distress or retractions.     Breath sounds: Decreased air movement present. No wheezing, rhonchi or rales.     Comments: Difficult exam with limited inspiratory efforts and limited mobility, could auscultate from right anterior/lateral and upper posterior, and left anterior and left upper posterior lung fields/chest Abdominal:     General: Bowel sounds are normal. There is no distension.     Palpations: Abdomen is soft.     Tenderness: There is no abdominal tenderness. There is no guarding or rebound.  Skin:    Coloration: Skin is pale. Skin is not jaundiced.     Findings: No bruising or lesion.  Neurological:     Mental Status: He is alert.     Motor: Weakness present.  Gait: Gait abnormal.     Comments: WC bound Slumped to left Left arm  contractures, LUE, B/L LE atrophy, pt does not move  Psychiatric:        Behavior: Behavior is cooperative.         Assessment & Plan:   Problem List Items Addressed This Visit       Digestive   GERD (gastroesophageal reflux disease)    Sx stable and well controlled Can do daily PPI, lowered the dose since not complaining of sx often      Relevant Medications   pantoprazole (PROTONIX) 20 MG tablet   Other Relevant Orders   COMPLETE METABOLIC PANEL WITH GFR   CBC with Differential/Platelet     Nervous and Auditory   Cerebral palsy (HCC) - Primary    Stable, trial of baclofen for spasms      Relevant Medications   Baclofen 5 MG TABS     Other   History of DVT of lower extremity    Chronic DVT, no hx of PE, with long term stasis, on long term anticoagulants for many years, monitoring labs and exam today, no change to LE edema, no pain to LE, no CP SOB or tachycardia, continue xarelto, labs pending      Relevant Medications   rivaroxaban (XARELTO) 10 MG TABS tablet   Other Relevant Orders   COMPLETE METABOLIC PANEL WITH GFR   CBC with Differential/Platelet   Mood disorder ([place])    Mood disorder managed with abilify 15 daily - has been on medications for years, refills taken over by prior PCP in 2016, prior to that managed by psych and family has monitored mood/behavior changes with PCP refilling meds, stable dosing since 2016 per chart review Seems stable, no reported behavioral issues, monitoring metabolic SE More harm than benefit to d/c or adjust or have pt work with psychiatry For now continue the same, sister and caregiver encouraged to get f/up appt with any mood/behavioral changes      Relevant Medications   ARIPiprazole (ABILIFY) 15 MG tablet   citalopram (CELEXA) 20 MG tablet   Monitoring for long-term anticoagulant use   Relevant Medications   rivaroxaban (XARELTO) 10 MG TABS tablet   Other Relevant Orders   COMPLETE METABOLIC PANEL WITH GFR   CBC with  Differential/Platelet   Iron deficiency anemia    Last labs showed no IDA, recheck labs and update chart, previously was seeing hematology, more than 4 years ago      Relevant Orders   CBC with Differential/Platelet   Iron, TIBC and Ferritin Panel   Hyperlipidemia, unspecified   Relevant Medications   rosuvastatin (CRESTOR) 5 MG tablet   rivaroxaban (XARELTO) 10 MG TABS tablet   Other Relevant Orders   COMPLETE METABOLIC PANEL WITH GFR   Lipid panel   CBC with Differential/Platelet   COMPLETE METABOLIC PANEL WITH GFR   Lipid panel   Current moderate episode of major depressive disorder (HCC)    Stable, phq 9 and GAD 7 reviewed, continue celexa 20 and abilify       Relevant Medications   ARIPiprazole (ABILIFY) 15 MG tablet   citalopram (CELEXA) 20 MG tablet   Suprapubic catheter ([place])    monitored by urology and [place] nursing, stable, no recent changes or concerns      Requires continuous supervision for activities of daily living (ADL)    Seeing palliative care, previously has had [place] PT/OT/nursing and SW consult, currently cared for primarily by sister [name]  as personal care aid      Other Visit Diagnoses     Need for hepatitis C screening test       Relevant Orders   Hepatitis C antibody   Need for pneumococcal vaccination       refused today   Need for shingles vaccine       refused   Need for Tdap vaccination       refused   Allergic rhinitis, unspecified seasonality, unspecified trigger       can use OTC meds as needed   Encounter for medication monitoring       Relevant Orders   COMPLETE METABOLIC PANEL WITH GFR   Lipid panel   CBC with Differential/Platelet       Pt with limited ability to leave house and get to appts He has HH and PCA, palliative recently started consulting Labs today and hopefully will be able to do labs when needed with assistance from HH/palliative Will try to get him in office once a year for in person exam and labs   Return in  about 6 months (around 09/24/2022) for Routine follow-up (virtual/telephone ok).   Delsa Grana, PA-C 03/26/22 4:27 PM

## 2022-03-26 NOTE — Assessment & Plan Note (Addendum)
Mood disorder managed with abilify 15 daily - has been on medications for years, refills taken over by prior PCP in 2016, prior to that managed by psych and family has monitored mood/behavior changes with PCP refilling meds, stable dosing since 2016 per chart review Seems stable, no reported behavioral issues, monitoring metabolic SE More harm than benefit to d/c or adjust or have pt work with psychiatry For now continue the same, sister and caregiver encouraged to get f/up appt with any mood/behavioral changes

## 2022-03-26 NOTE — Assessment & Plan Note (Signed)
Stable, trial of baclofen for spasms

## 2022-03-26 NOTE — Assessment & Plan Note (Signed)
Sx stable and well controlled Can do daily PPI, lowered the dose since not complaining of sx often

## 2022-03-27 LAB — CBC WITH DIFFERENTIAL/PLATELET
Absolute Monocytes: 353 cells/uL (ref 200–950)
Basophils Absolute: 39 cells/uL (ref 0–200)
Basophils Relative: 0.8 %
Eosinophils Absolute: 20 cells/uL (ref 15–500)
Eosinophils Relative: 0.4 %
HCT: 30.1 % — ABNORMAL LOW (ref 38.5–50.0)
Hemoglobin: 9.5 g/dL — ABNORMAL LOW (ref 13.2–17.1)
Lymphs Abs: 1367 cells/uL (ref 850–3900)
MCH: 23.6 pg — ABNORMAL LOW (ref 27.0–33.0)
MCHC: 31.6 g/dL — ABNORMAL LOW (ref 32.0–36.0)
MCV: 74.9 fL — ABNORMAL LOW (ref 80.0–100.0)
MPV: 11.8 fL (ref 7.5–12.5)
Monocytes Relative: 7.2 %
Neutro Abs: 3121 cells/uL (ref 1500–7800)
Neutrophils Relative %: 63.7 %
Platelets: 213 10*3/uL (ref 140–400)
RBC: 4.02 10*6/uL — ABNORMAL LOW (ref 4.20–5.80)
RDW: 16.6 % — ABNORMAL HIGH (ref 11.0–15.0)
Total Lymphocyte: 27.9 %
WBC: 4.9 10*3/uL (ref 3.8–10.8)

## 2022-03-27 LAB — LIPID PANEL
Cholesterol: 121 mg/dL (ref <200)
HDL: 44 mg/dL (ref >=40)
LDL Cholesterol (Calc): 63 mg/dL (calc)
Non-HDL Cholesterol (Calc): 77 mg/dL (calc) (ref <130)
Total CHOL/HDL Ratio: 2.8 (calc) (ref <5.0)
Triglycerides: 65 mg/dL (ref <150)

## 2022-03-27 LAB — COMPLETE METABOLIC PANEL WITH GFR
AG Ratio: 1.6 (calc) (ref 1.0–2.5)
ALT: 14 U/L (ref 9–46)
AST: 10 U/L (ref 10–35)
Albumin: 4 g/dL (ref 3.6–5.1)
Alkaline phosphatase (APISO): 71 U/L (ref 35–144)
BUN/Creatinine Ratio: 28 (calc) — ABNORMAL HIGH (ref 6–22)
BUN: 19 mg/dL (ref 7–25)
CO2: 23 mmol/L (ref 20–32)
Calcium: 8.5 mg/dL — ABNORMAL LOW (ref 8.6–10.3)
Chloride: 108 mmol/L (ref 98–110)
Creat: 0.67 mg/dL — ABNORMAL LOW (ref 0.70–1.35)
Globulin: 2.5 g/dL (calc) (ref 1.9–3.7)
Glucose, Bld: 87 mg/dL (ref 65–99)
Potassium: 4.3 mmol/L (ref 3.5–5.3)
Sodium: 140 mmol/L (ref 135–146)
Total Bilirubin: 0.4 mg/dL (ref 0.2–1.2)
Total Protein: 6.5 g/dL (ref 6.1–8.1)
eGFR: 104 mL/min/{1.73_m2} (ref >=60)

## 2022-03-27 LAB — IRON,TIBC AND FERRITIN PANEL
%SAT: 5 % (calc) — ABNORMAL LOW (ref 20–48)
Ferritin: 4 ng/mL — ABNORMAL LOW (ref 24–380)
Iron: 24 ug/dL — ABNORMAL LOW (ref 50–180)
TIBC: 450 mcg/dL (calc) — ABNORMAL HIGH (ref 250–425)

## 2022-03-27 LAB — HEPATITIS C ANTIBODY: Hepatitis C Ab: NONREACTIVE

## 2022-03-27 NOTE — Progress Notes (Signed)
Addendum with abnormal labs Decreased H/H with iron deficiency - have been concerned about this w/o labs or pt coming in for appts with prior [facility] ER visit and long term anticoagulation Hemoglobin  Date Value Ref Range Status  03/26/2022 9.5 (L) 13.2 - 17.1 g/dL Final  04/10/2020 13.4 13.2 - 17.1 g/dL Final  10/05/2019 14.5 13.2 - 17.1 g/dL Final  03/21/2019 14.2 13.2 - 17.1 g/dL Final  10/07/2015 13.9 12.6 - 17.7 g/dL Final   HGB  Date Value Ref Range Status  01/25/2014 13.4 13.0 - 18.0 g/dL Final  01/11/2012 12.2 (L) 13.0 - 18.0 g/dL Final   Lab Results  Component Value Date   IRON 24 (L) 03/26/2022   TIBC 450 (H) 03/26/2022   FERRITIN 4 (L) 03/26/2022   Labs 2 years apart - 4 point drop in hgb Will be trying to obtain hemoccult per [facility]? Or with help of Barbara/sister and caregiver He has previously seen GI in the past for iron deficiency anemia and EGD was done x 2 but colonoscopy was not able to be completed due to poor prep and then pt was lost to f/up, he did follow with hematology for a few years, got infusions and then stopped seeing them after labs improved. Per [name] yesterday there has been no gross blood noted in BM, no melena or hematochezia Yesterday pt was mildly pale appearing but not unexpected since he stays indoors 100% of the time with his health and dx, he was not cyanotic, HR BP and pulse ox all WNL and no acute complaints regarding energy/fatigue/tachycardia/dyspnea/paresthesias  Orders added various ways today - home hemoccult x 3 stool cards vs hemoccult orders for [facility] to complete - will be calling pt/barbara and [place]     Iron deficiency anemia, unspecified iron deficiency anemia type  - POC Hemoccult Bld/Stl (3-Cd Home Screen); Future - POCT Occult Blood Stool; Future   Monitoring for long-term anticoagulant use  - POC Hemoccult Bld/Stl (3-Cd Home Screen); Future - POCT Occult Blood Stool; Future  03/27/22 12:42 PM Delsa Grana, PA-C

## 2022-03-27 NOTE — Addendum Note (Signed)
Addended by: Delsa Grana on: 03/27/2022 12:42 PM   Modules accepted: Orders

## 2022-04-01 DIAGNOSIS — K219 Gastro-esophageal reflux disease without esophagitis: Secondary | ICD-10-CM | POA: Diagnosis not present

## 2022-04-01 DIAGNOSIS — E785 Hyperlipidemia, unspecified: Secondary | ICD-10-CM | POA: Diagnosis not present

## 2022-04-13 NOTE — Progress Notes (Signed)
Patient No show.

## 2022-04-14 DIAGNOSIS — F329 Major depressive disorder, single episode, unspecified: Secondary | ICD-10-CM | POA: Diagnosis not present

## 2022-04-14 DIAGNOSIS — N319 Neuromuscular dysfunction of bladder, unspecified: Secondary | ICD-10-CM | POA: Diagnosis not present

## 2022-04-14 DIAGNOSIS — G809 Cerebral palsy, unspecified: Secondary | ICD-10-CM | POA: Diagnosis not present

## 2022-04-14 DIAGNOSIS — Z435 Encounter for attention to cystostomy: Secondary | ICD-10-CM | POA: Diagnosis not present

## 2022-04-14 DIAGNOSIS — Z466 Encounter for fitting and adjustment of urinary device: Secondary | ICD-10-CM | POA: Diagnosis not present

## 2022-04-14 DIAGNOSIS — M24542 Contracture, left hand: Secondary | ICD-10-CM | POA: Diagnosis not present

## 2022-04-23 DIAGNOSIS — M24542 Contracture, left hand: Secondary | ICD-10-CM | POA: Diagnosis not present

## 2022-04-23 DIAGNOSIS — Z86718 Personal history of other venous thrombosis and embolism: Secondary | ICD-10-CM | POA: Diagnosis not present

## 2022-04-23 DIAGNOSIS — G809 Cerebral palsy, unspecified: Secondary | ICD-10-CM | POA: Diagnosis not present

## 2022-04-23 DIAGNOSIS — Z7901 Long term (current) use of anticoagulants: Secondary | ICD-10-CM | POA: Diagnosis not present

## 2022-04-23 DIAGNOSIS — E785 Hyperlipidemia, unspecified: Secondary | ICD-10-CM | POA: Diagnosis not present

## 2022-04-23 DIAGNOSIS — M24522 Contracture, left elbow: Secondary | ICD-10-CM | POA: Diagnosis not present

## 2022-04-23 DIAGNOSIS — N319 Neuromuscular dysfunction of bladder, unspecified: Secondary | ICD-10-CM | POA: Diagnosis not present

## 2022-04-23 DIAGNOSIS — Z466 Encounter for fitting and adjustment of urinary device: Secondary | ICD-10-CM | POA: Diagnosis not present

## 2022-04-23 DIAGNOSIS — K219 Gastro-esophageal reflux disease without esophagitis: Secondary | ICD-10-CM | POA: Diagnosis not present

## 2022-04-23 DIAGNOSIS — Z95828 Presence of other vascular implants and grafts: Secondary | ICD-10-CM | POA: Diagnosis not present

## 2022-04-23 DIAGNOSIS — F329 Major depressive disorder, single episode, unspecified: Secondary | ICD-10-CM | POA: Diagnosis not present

## 2022-04-23 DIAGNOSIS — Z435 Encounter for attention to cystostomy: Secondary | ICD-10-CM | POA: Diagnosis not present

## 2022-04-27 ENCOUNTER — Telehealth: Payer: Self-pay | Admitting: Family Medicine

## 2022-04-27 NOTE — Telephone Encounter (Signed)
Notified pt sister to come for a new one and ready up front for her.

## 2022-04-27 NOTE — Telephone Encounter (Signed)
Copied from [place] [id number]. Topic: General - Other >> Apr 27, 2022 10:20 AM Sabas Sous wrote: Reason for CRM: Pt's sister called to report that she has stool samples from the patient that were supposed to have been submitted on thanksigiving. She says she can come and pick up a new sample kit. This is his third sample that must be submitted immediately apparently.

## 2022-05-01 ENCOUNTER — Other Ambulatory Visit: Payer: Self-pay

## 2022-05-01 DIAGNOSIS — Z5181 Encounter for therapeutic drug level monitoring: Secondary | ICD-10-CM

## 2022-05-01 DIAGNOSIS — D509 Iron deficiency anemia, unspecified: Secondary | ICD-10-CM

## 2022-05-01 LAB — POC HEMOCCULT BLD/STL (HOME/3-CARD/SCREEN)
Card #2 Fecal Occult Blod, POC: NEGATIVE
Card #3 Fecal Occult Blood, POC: NEGATIVE
Fecal Occult Blood, POC: NEGATIVE

## 2022-05-05 DIAGNOSIS — G809 Cerebral palsy, unspecified: Secondary | ICD-10-CM | POA: Diagnosis not present

## 2022-05-05 DIAGNOSIS — D649 Anemia, unspecified: Secondary | ICD-10-CM | POA: Diagnosis not present

## 2022-05-05 DIAGNOSIS — N319 Neuromuscular dysfunction of bladder, unspecified: Secondary | ICD-10-CM | POA: Diagnosis not present

## 2022-05-05 DIAGNOSIS — M24542 Contracture, left hand: Secondary | ICD-10-CM | POA: Diagnosis not present

## 2022-05-05 DIAGNOSIS — Z435 Encounter for attention to cystostomy: Secondary | ICD-10-CM | POA: Diagnosis not present

## 2022-05-05 DIAGNOSIS — F329 Major depressive disorder, single episode, unspecified: Secondary | ICD-10-CM | POA: Diagnosis not present

## 2022-05-05 DIAGNOSIS — Z466 Encounter for fitting and adjustment of urinary device: Secondary | ICD-10-CM | POA: Diagnosis not present

## 2022-05-05 LAB — CBC AND DIFFERENTIAL
HCT: 36 — AB (ref 41–53)
Hemoglobin: 10.9 — AB (ref 13.5–17.5)
Neutrophils Absolute: 4.4
Platelets: 195 10*3/uL (ref 150–400)
WBC: 7.1

## 2022-05-05 LAB — CBC: RBC: 4.53 (ref 3.87–5.11)

## 2022-05-11 ENCOUNTER — Encounter: Payer: Self-pay | Admitting: Family Medicine

## 2022-05-20 DIAGNOSIS — Z435 Encounter for attention to cystostomy: Secondary | ICD-10-CM | POA: Diagnosis not present

## 2022-05-20 DIAGNOSIS — M24542 Contracture, left hand: Secondary | ICD-10-CM | POA: Diagnosis not present

## 2022-05-20 DIAGNOSIS — Z466 Encounter for fitting and adjustment of urinary device: Secondary | ICD-10-CM | POA: Diagnosis not present

## 2022-05-20 DIAGNOSIS — F329 Major depressive disorder, single episode, unspecified: Secondary | ICD-10-CM | POA: Diagnosis not present

## 2022-05-20 DIAGNOSIS — G809 Cerebral palsy, unspecified: Secondary | ICD-10-CM | POA: Diagnosis not present

## 2022-05-20 DIAGNOSIS — N319 Neuromuscular dysfunction of bladder, unspecified: Secondary | ICD-10-CM | POA: Diagnosis not present

## 2022-05-23 DIAGNOSIS — N319 Neuromuscular dysfunction of bladder, unspecified: Secondary | ICD-10-CM | POA: Diagnosis not present

## 2022-05-23 DIAGNOSIS — Z466 Encounter for fitting and adjustment of urinary device: Secondary | ICD-10-CM | POA: Diagnosis not present

## 2022-05-23 DIAGNOSIS — M24522 Contracture, left elbow: Secondary | ICD-10-CM | POA: Diagnosis not present

## 2022-05-23 DIAGNOSIS — G809 Cerebral palsy, unspecified: Secondary | ICD-10-CM | POA: Diagnosis not present

## 2022-05-23 DIAGNOSIS — Z95828 Presence of other vascular implants and grafts: Secondary | ICD-10-CM | POA: Diagnosis not present

## 2022-05-23 DIAGNOSIS — Z7901 Long term (current) use of anticoagulants: Secondary | ICD-10-CM | POA: Diagnosis not present

## 2022-05-23 DIAGNOSIS — M24542 Contracture, left hand: Secondary | ICD-10-CM | POA: Diagnosis not present

## 2022-05-23 DIAGNOSIS — Z435 Encounter for attention to cystostomy: Secondary | ICD-10-CM | POA: Diagnosis not present

## 2022-05-23 DIAGNOSIS — K219 Gastro-esophageal reflux disease without esophagitis: Secondary | ICD-10-CM | POA: Diagnosis not present

## 2022-05-23 DIAGNOSIS — F329 Major depressive disorder, single episode, unspecified: Secondary | ICD-10-CM | POA: Diagnosis not present

## 2022-05-23 DIAGNOSIS — E785 Hyperlipidemia, unspecified: Secondary | ICD-10-CM | POA: Diagnosis not present

## 2022-05-23 DIAGNOSIS — Z86718 Personal history of other venous thrombosis and embolism: Secondary | ICD-10-CM | POA: Diagnosis not present

## 2022-05-26 ENCOUNTER — Telehealth: Payer: Self-pay | Admitting: Family Medicine

## 2022-05-26 NOTE — Telephone Encounter (Signed)
Labs ordered by a different provider

## 2022-05-26 NOTE — Telephone Encounter (Unsigned)
Copied from [place] [phone]. Topic: General - Other >> May 26, 2022 12:54 PM Chapman Fitch wrote: Reason for CRM: pts sister [name] noticed pt levels indicated he maybe a bit anemic/ she would like a call to go over labs and what's the next step for the pt /please advise

## 2022-05-29 NOTE — Telephone Encounter (Signed)
Per Gerald Montgomery- they need an appointment to go over results/concerns

## 2022-05-29 NOTE — Telephone Encounter (Signed)
Pt is scheduled for a video visit

## 2022-06-01 DIAGNOSIS — N319 Neuromuscular dysfunction of bladder, unspecified: Secondary | ICD-10-CM | POA: Diagnosis not present

## 2022-06-01 DIAGNOSIS — G809 Cerebral palsy, unspecified: Secondary | ICD-10-CM | POA: Diagnosis not present

## 2022-06-01 DIAGNOSIS — Z435 Encounter for attention to cystostomy: Secondary | ICD-10-CM | POA: Diagnosis not present

## 2022-06-01 DIAGNOSIS — M24542 Contracture, left hand: Secondary | ICD-10-CM | POA: Diagnosis not present

## 2022-06-01 DIAGNOSIS — Z466 Encounter for fitting and adjustment of urinary device: Secondary | ICD-10-CM | POA: Diagnosis not present

## 2022-06-01 DIAGNOSIS — F329 Major depressive disorder, single episode, unspecified: Secondary | ICD-10-CM | POA: Diagnosis not present

## 2022-06-02 ENCOUNTER — Encounter: Payer: Self-pay | Admitting: Family Medicine

## 2022-06-02 ENCOUNTER — Telehealth (INDEPENDENT_AMBULATORY_CARE_PROVIDER_SITE_OTHER): Payer: Medicare Other | Admitting: Family Medicine

## 2022-06-02 DIAGNOSIS — G809 Cerebral palsy, unspecified: Secondary | ICD-10-CM

## 2022-06-02 DIAGNOSIS — Z86718 Personal history of other venous thrombosis and embolism: Secondary | ICD-10-CM | POA: Diagnosis not present

## 2022-06-02 DIAGNOSIS — K219 Gastro-esophageal reflux disease without esophagitis: Secondary | ICD-10-CM | POA: Diagnosis not present

## 2022-06-02 DIAGNOSIS — D509 Iron deficiency anemia, unspecified: Secondary | ICD-10-CM | POA: Diagnosis not present

## 2022-06-02 NOTE — Progress Notes (Signed)
Virtual Visit via Video Note  I connected with Gerald Montgomery on 06/02/22 at 11:20 AM EST by a video enabled telemedicine application and verified that I am speaking with the correct person using two identifiers.  Location: Patient: home Provider: home Sister/Caretaker Gerald Montgomery also on call.    I discussed the limitations of evaluation and management by telemedicine and the availability of in person appointments. The patient expressed understanding and agreed to proceed.  History of Present Illness:  ANEMIA - h/o DVT on xarelto. Wheelchair bound, has CP.  - Hb 9.5>10.9 03/2022 > 04/2022. FOBT negative at that time. Anemia panel consistent with IDA.  - 2019 UGI with non bleeding erosive gastropathy. Colonoscopy unable to perform due to poor prep. Recommended rescheduling. Hb stable at that time. - h/o GERD on PPI but has not been taking consistently, only takes prn. Hasn't taken in about 1 month.  - having regular daily BM, no straining,  no blood, no pain. Has suprapubic catheter, no problems, no blood in urine.  - denies CP, SOB, pica, palpitations. Having some fatigue but this has improved since November. - endorses some heartburn occasionally, taking Tums with good relief. - reports some increase in coughing with allergies and weather changes.   Observations/Objective:  Well appearing, in NAD. Speaks in full sentences. Comfortable WOB on RA. No resp distress.    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 Anticoagulation likely contributing to anemia however given history and chronic stasis, continue xarelto for now.       Iron  deficiency anemia    Last Hb improved 04/2022 consistent with improvement in anemic symptoms. With h/o erosive gastropathy on UGI, some recent heartburn, long term anticoagulation and only prn use of PPI, suspect worsening of gastritis contributing to anemia given no additional source of bleeding reported. Recommend returning to daily use of PPI. Per chart review, was never able to complete colonoscopy due to poor prep, will refer back to GI for colonoscopy and possible repeat UGI given worsened anemia since last procedure. Will also have [place] nurse repeat CBC to trend. F/u in 3 months.  Consider urine studies at that time if GI workup unrevealing.       Relevant Orders   Ambulatory referral to Gastroenterology   CBC with Differential      I discussed the assessment and treatment plan with the patient. The patient was provided an opportunity to ask questions and all were answered. The patient agreed with the plan and demonstrated an understanding of the instructions.   The patient was advised to call back or seek an in-person evaluation if the symptoms worsen or if the condition fails to improve as anticipated.  I provided 25 minutes of non-face-to-face time during this encounter.   Myles Gip, DO

## 2022-06-02 NOTE — Patient Instructions (Signed)
It was great to see you!  Our plans for today:  - Take the pantoprazole daily. You can still use Tums as needed for heartburn and gas. - We are referring you back to the GI doctor for colonoscopy and possible repeat upper endoscopy. Let us know if you don't hear about an appointment in the next few weeks.  - We will recheck your labs with the home health nurse. - Come back in 3 months for recheck.  Take care and seek immediate care sooner if you develop any concerns.   Dr. Ky Barban

## 2022-06-02 NOTE — Assessment & Plan Note (Signed)
Last Hb improved 04/2022 consistent with improvement in anemic symptoms. With h/o erosive gastropathy on UGI, some recent heartburn, long term anticoagulation and only prn use of PPI, suspect worsening of gastritis contributing to anemia given no additional source of bleeding reported. Recommend returning to daily use of PPI. Per chart review, was never able to complete colonoscopy due to poor prep, will refer back to GI for colonoscopy and possible repeat UGI given worsened anemia since last procedure. Will also have [place] nurse repeat CBC to trend. F/u in 3 months.  Consider urine studies at that time if GI workup unrevealing.

## 2022-06-02 NOTE — Assessment & Plan Note (Signed)
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 Anticoagulation likely contributing to anemia however given history and chronic stasis, continue xarelto for now.

## 2022-07-13 ENCOUNTER — Telehealth: Payer: Self-pay

## 2022-07-13 NOTE — Telephone Encounter (Signed)
FYI   Copied from [place]. Topic: General - Other >> Jul 13, 2022  8:30 AM [place] B wrote: Reason for CRM: Meg from [facility], says they Lynnae Prude' have medical coverage for the patient so they are trying to get [facility] for him

## 2022-07-14 NOTE — Telephone Encounter (Signed)
Pt is scheduled for a virtual on Friday 07/17/22

## 2022-07-17 ENCOUNTER — Telehealth (INDEPENDENT_AMBULATORY_CARE_PROVIDER_SITE_OTHER): Payer: Medicare Other | Admitting: Family Medicine

## 2022-07-17 DIAGNOSIS — D509 Iron deficiency anemia, unspecified: Secondary | ICD-10-CM

## 2022-07-17 DIAGNOSIS — Z743 Need for continuous supervision: Secondary | ICD-10-CM | POA: Diagnosis not present

## 2022-07-17 DIAGNOSIS — Z7401 Bed confinement status: Secondary | ICD-10-CM

## 2022-07-17 DIAGNOSIS — Z5181 Encounter for therapeutic drug level monitoring: Secondary | ICD-10-CM

## 2022-07-17 DIAGNOSIS — K219 Gastro-esophageal reflux disease without esophagitis: Secondary | ICD-10-CM

## 2022-07-17 DIAGNOSIS — Z9181 History of falling: Secondary | ICD-10-CM | POA: Diagnosis not present

## 2022-07-17 DIAGNOSIS — Z9359 Other cystostomy status: Secondary | ICD-10-CM | POA: Diagnosis not present

## 2022-07-17 DIAGNOSIS — E785 Hyperlipidemia, unspecified: Secondary | ICD-10-CM | POA: Diagnosis not present

## 2022-07-17 DIAGNOSIS — G809 Cerebral palsy, unspecified: Secondary | ICD-10-CM

## 2022-07-17 DIAGNOSIS — N319 Neuromuscular dysfunction of bladder, unspecified: Secondary | ICD-10-CM

## 2022-07-17 DIAGNOSIS — Z7901 Long term (current) use of anticoagulants: Secondary | ICD-10-CM

## 2022-07-17 NOTE — Progress Notes (Unsigned)
Name: Gerald Montgomery   MRN: [id number]    DOB: 1955/10/15   Date:07/17/2022       Progress Note  Subjective:    Chief Complaint  Chief Complaint  Patient presents with   Consult    I connected with  Gerald Montgomery  on 07/17/22 at 10:40 AM EST by a video enabled telemedicine application and verified that I am speaking with the correct person using two identifiers.  I discussed the limitations of evaluation and management by telemedicine and the availability of in person appointments. The patient expressed understanding and agreed to proceed. Staff also discussed with the patient that there may be a patient responsible charge related to this service. Patient Location: home Provider Location: [facility] clinic Additional Individuals present: sister [name]  HPI Needs new [place] orders and recent face to face visit  Suprapubic catheter - previously managed by [facility] RN and urology - last change was earlier this week.  GERD and bowels are good, no concerning sx or compliants, he is having soft bowel movements, eating well, no blood in stool Anemia - he is still pending colonoscopy   Patient Active Problem List   Diagnosis Date Noted   Patient is full code 03/19/2022   Requires continuous supervision for activities of daily living (ADL) 03/19/2022   Current moderate episode of major depressive disorder ([place]) 04/10/2020   Suprapubic catheter ([place]) 04/10/2020   Hyperlipidemia, unspecified 03/23/2019   Iron deficiency anemia 08/24/2017   Hypochromic microcytic anemia 08/03/2017   GERD (gastroesophageal reflux disease) 08/03/2017   Cerebral palsy ([place]) 12/05/2015   History of DVT of lower extremity 04/11/2015   Mood disorder ([place]) 04/11/2015   Neurogenic bladder 04/11/2015   Monitoring for long-term anticoagulant use 04/11/2015    Social History   Tobacco Use   Smoking status: Never    Passive exposure: Never   Smokeless tobacco: Never  Substance Use Topics   Alcohol use: No     Alcohol/week: 0.0 standard drinks of alcohol     Current Outpatient Medications:    ARIPiprazole (ABILIFY) 15 MG tablet, Take 1 tablet (15 mg total) by mouth daily., Disp: 90 tablet, Rfl: 3   Baclofen 5 MG TABS, Take 5 mg by mouth 3 (three) times daily as needed (msk pain or spasms)., Disp: 90 tablet, Rfl: 3   citalopram (CELEXA) 20 MG tablet, Take 1 tablet (20 mg total) by mouth daily., Disp: 90 tablet, Rfl: 3   fexofenadine (ALLEGRA) 180 MG tablet, Take 180 mg by mouth daily., Disp: , Rfl:    fluticasone (FLONASE) 50 MCG/ACT nasal spray, Place into both nostrils daily., Disp: , Rfl:    oxybutynin (DITROPAN) 5 MG tablet, Take 1 tablet (5 mg total) by mouth 2 (two) times daily., Disp: 180 tablet, Rfl: 3   pantoprazole (PROTONIX) 20 MG tablet, Take 1 tablet (20 mg total) by mouth daily before breakfast., Disp: 90 tablet, Rfl: 3   rivaroxaban (XARELTO) 10 MG TABS tablet, Take 1 tablet (10 mg total) by mouth daily., Disp: 90 tablet, Rfl: 3   rosuvastatin (CRESTOR) 5 MG tablet, Take 1 tablet (5 mg total) by mouth daily., Disp: 90 tablet, Rfl: 3  Current Facility-Administered Medications:    lidocaine (XYLOCAINE) 2 % jelly 1 application, 1 application , Urethral, Once, Stoioff, Ronda Fairly, MD  Allergies  Allergen Reactions   Macrobid [Nitrofurantoin]    Penicillins     Sickness/sweating   Sulfa Antibiotics Itching    I personally reviewed active problem list, medication list,  allergies, family history, social history, health maintenance, notes from last encounter, lab results, imaging with the patient/caregiver today.   Review of Systems  Constitutional: Negative.   HENT: Negative.    Eyes: Negative.   Respiratory: Negative.    Cardiovascular: Negative.   Gastrointestinal: Negative.   Endocrine: Negative.   Genitourinary: Negative.   Musculoskeletal: Negative.   Skin: Negative.   Allergic/Immunologic: Negative.   Neurological: Negative.   Hematological: Negative.    Psychiatric/Behavioral: Negative.    All other systems reviewed and are negative.     Objective:   Virtual encounter, vitals limited, only able to obtain the following There were no vitals filed for this visit. There is no height or weight on file to calculate BMI. Nursing Note and Vital Signs reviewed.  Physical Exam Pt laying in bed, alert, answering question appropriately in short sentences PE limited by virtual encounter  No results found for this or any previous visit (from the past 72 hour(s)).  Assessment and Plan:     ICD-10-CM   1. Cerebral palsy, unspecified type ([place])  G80.9 Ambulatory referral to Home Health    2. Requires continuous supervision for activities of daily living (ADL)  Z74.3 Ambulatory referral to [place]    3. Suprapubic catheter [facility])  Z93.59 Ambulatory referral to [place]    4. Iron deficiency anemia, unspecified iron deficiency anemia type  D50.9 CBC with Differential/Platelet    IBC panel    Ambulatory referral to [place]    5. Neurogenic bladder  N31.9     6. Gastroesophageal reflux disease without esophagitis  K21.9     7. Hyperlipidemia, unspecified hyperlipidemia type  E78.5     8. Monitoring for long-term anticoagulant use  Z51.81 CBC with Differential/Platelet   Z79.01 Ambulatory referral to [place]. At high risk for falls  Z91.81 Ambulatory referral to Home Health    10. Bedbound  Z74.01 Ambulatory referral to [place] flags and when to present for emergency care or RTC including fever >101.49F, chest pain, shortness of breath, new/worsening/un-resolving symptoms, reviewed with patient at time of visit. Follow up and care instructions discussed and provided in AVS. - I discussed the assessment and treatment plan with the patient. The patient was provided an opportunity to ask questions and all were answered. The patient agreed with the plan and demonstrated an understanding of the  instructions.  I provided 20 minutes of non-face-to-face time during this encounter.  Delsa Grana, PA-C 07/17/22 11:07 AM

## 2022-07-22 DIAGNOSIS — Z7401 Bed confinement status: Secondary | ICD-10-CM | POA: Insufficient documentation

## 2022-08-13 ENCOUNTER — Encounter: Payer: Self-pay | Admitting: Oncology

## 2022-08-24 ENCOUNTER — Encounter: Payer: Self-pay | Admitting: Oncology

## 2022-08-28 ENCOUNTER — Encounter: Payer: Self-pay | Admitting: Oncology

## 2022-08-31 ENCOUNTER — Other Ambulatory Visit: Payer: Self-pay

## 2022-08-31 ENCOUNTER — Encounter: Payer: Self-pay | Admitting: Oncology

## 2022-08-31 ENCOUNTER — Telehealth: Payer: Self-pay | Admitting: Family Medicine

## 2022-08-31 ENCOUNTER — Encounter: Payer: Self-pay | Admitting: Gastroenterology

## 2022-08-31 ENCOUNTER — Ambulatory Visit (INDEPENDENT_AMBULATORY_CARE_PROVIDER_SITE_OTHER): Payer: Medicare PPO | Admitting: Gastroenterology

## 2022-08-31 VITALS — BP 168/130 | HR 86 | Temp 98.0°F

## 2022-08-31 DIAGNOSIS — D509 Iron deficiency anemia, unspecified: Secondary | ICD-10-CM

## 2022-08-31 MED ORDER — NA SULFATE-K SULFATE-MG SULF 17.5-3.13-1.6 GM/177ML PO SOLN: 354.0000 mL | Freq: Once | ORAL | 0 refills | Status: AC

## 2022-08-31 NOTE — Telephone Encounter (Signed)
The sister called in stating she is very frustrated due to the supplies the patient gets regarding a urine bag, catheter tray and foley. She says they are always leaking and always have sediment out of the seem. She is going to [facility] now to get this but needs the provider to send from now on 3 bags, 3 catheter trays and 3 foley's. Please assist further as he uses [facility] in [place] on [address].

## 2022-08-31 NOTE — Progress Notes (Unsigned)
[name], MD [address]  [address]  [place], [place] [address]  Main: [phone]  Fax: [phone]    Gastroenterology Consultation  Referring Provider:     [name], [name], PA-C Primary Care Physician:  [name], [name], PA-C Primary Gastroenterologist:  Dr. Arlyss Repressohini R [name] Reason for Consultation:   Iron deficiency anemia        HPI:   Gerald Montgomery is a 67 y.o. Caucasian male referred by Danelle BerryLeisa Tapia for consultation & management of iron deficiency anemia. Patient has history of cerebral palsy, bedbound due to hip and femur fracture since 2012 who has been followed by Dr. Smith Robertao at [facility] for iron deficiency anemia and management of anticoagulation. Patient underwent EGD in 2018 which revealed severe esophageal candidiasis with esophagitis. Patient's hemoglobin has been gradually dropping since 01/2016 with progressive microcytosis since 06/2016, His ferritin levels were 6 in 07/2017.He also has B12 deficiency, most recent B12 levels were 151 on 08/2017. Patient received parenteral iron therapy which resulted in correction of iron deficiency anemia recently. Patient has been referred here to discuss about possible colonoscopy. Patient is accompanied by his 66 year old mom and his sister who is his healthcare power of attorney. Patient lives with his mother who takes care of him at home. His niece also helps with ADLs.  Follow-up visit 09/01/2022 Patient is referred back to me by his PCP for further evaluation of recurrence of iron deficiency anemia.  He was last seen by me in 2019 for iron deficiency anemia.  He could not undergo colonoscopy due to poor prep.  His EGD was normal at that time.  Patient is accompanied by his sister today who is his power of attorney.  According to his sister, she does not have any GI concerns with him.  He is found to have recurrence of anemia based on his routine labs from 11/23, Hb 9.5, MCV 74.9, serum ferritin 4,  recheck 10.9 in 12/23, hemoglobin 9.5 with MCV 74.9 in 12/23   NSAIDs: none  Antiplts/Anticoagulants/Anti thrombotics: on several toe for giving thrombosis in the left superficial femoral vein since March 2012 which was thought to be unprovoked  GI Procedures:  Upper endoscopy 12/27/2017 normal Normal gastric and duodenal biopsies  EGD by Dr. Orvan FalconerBeavers at [facility] in 01/2017 - LA Grade D reflux esophagitis. Biopsied. - Normal stomach. - Normal examined duodenum. DIAGNOSIS:  A. ESOPHAGUS, RANDOM; COLD BIOPSY:  - ACUTE EROSIVE ESOPHAGITIS.  - ESOPHAGEAL CANDIDIASIS.  - NEGATIVE FOR VIRUS CYTOPATHIC EFFECT, DYSPLASIA, AND MALIGNANCY.    Past Medical History:  Diagnosis Date   Cerebral palsy    Depression    GERD (gastroesophageal reflux disease)    History of DVT of lower extremity    Mood disorder    Neurogenic bladder    Urinary incontinence     Past Surgical History:  Procedure Laterality Date   COLONOSCOPY WITH PROPOFOL N/A 12/27/2017   Procedure: COLONOSCOPY WITH PROPOFOL;  Surgeon: Toney ReilVanga, Taysha Majewski Reddy, MD;  Location: [facility] ENDOSCOPY;  Service: Gastroenterology;  Laterality: N/A;   ESOPHAGOGASTRODUODENOSCOPY Left 02/07/2017   Procedure: ESOPHAGOGASTRODUODENOSCOPY (EGD);  Surgeon: Tressia DanasBeavers, Kimberly, MD;  [facility];  Service: Gastroenterology;  Laterality: Left;   ESOPHAGOGASTRODUODENOSCOPY (EGD) WITH PROPOFOL  12/27/2017   Procedure: ESOPHAGOGASTRODUODENOSCOPY (EGD) WITH PROPOFOL;  Surgeon: Toney ReilVanga, Lotus Santillo Reddy, MD;  Location: [facility] ENDOSCOPY;  Service: Gastroenterology;;   filter surgery for blood clot  2013   HIP FRACTURE SURGERY  2013   IR  CATHETER TUBE CHANGE  07/07/2017    Current Outpatient Medications:    ARIPiprazole (ABILIFY) 15 MG tablet, Take 1 tablet (15 mg total) by mouth daily., Disp: 90 tablet, Rfl: 3   citalopram (CELEXA) 20 MG tablet, Take 1 tablet (20 mg total) by mouth daily., Disp: 90 tablet, Rfl: 3   fexofenadine  (ALLEGRA) 180 MG tablet, Take 180 mg by mouth daily., Disp: , Rfl:    fluticasone (FLONASE) 50 MCG/ACT nasal spray, Place into both nostrils daily., Disp: , Rfl:    oxybutynin (DITROPAN) 5 MG tablet, Take 1 tablet (5 mg total) by mouth 2 (two) times daily., Disp: 180 tablet, Rfl: 3   pantoprazole (PROTONIX) 20 MG tablet, Take 1 tablet (20 mg total) by mouth daily before breakfast., Disp: 90 tablet, Rfl: 3   rivaroxaban (XARELTO) 10 MG TABS tablet, Take 1 tablet (10 mg total) by mouth daily., Disp: 90 tablet, Rfl: 3   rosuvastatin (CRESTOR) 5 MG tablet, Take 1 tablet (5 mg total) by mouth daily., Disp: 90 tablet, Rfl: 3  Current Facility-Administered Medications:    lidocaine (XYLOCAINE) 2 % jelly 1 application, 1 application , Urethral, Once, Stoioff, Verna Czech, MD    Family History  Problem Relation Age of Onset   Heart disease Mother    Atrial fibrillation Mother    Diabetes Mother        Borderline   Heart disease Father        Passed away of heart attack   Kidney disease Neg Hx    Prostate cancer Neg Hx      Social History   Tobacco Use   Smoking status: Never    Passive exposure: Never   Smokeless tobacco: Never  Vaping Use   Vaping Use: Never used  Substance Use Topics   Alcohol use: No    Alcohol/week: 0.0 standard drinks of alcohol   Drug use: No    Allergies as of 08/31/2022 - Review Complete 08/31/2022  Allergen Reaction Noted   Macrobid [nitrofurantoin]  06/07/2017   Penicillins  10/25/2015   Sulfa antibiotics Itching 10/29/2015    Review of Systems:    All systems reviewed and negative except where noted in HPI.   Physical Exam:  BP (!) 168/130 (BP Location: Left Arm, Patient Position: Sitting, Cuff Size: Normal)   Pulse 86   Temp 98 F (36.7 C) (Oral)  No LMP for male patient.  General:   Alert, well-built, well-nourished, frail-appearing Head:  Normocephalic and atraumatic. Eyes:  Sclera clear, no icterus.   Conjunctiva congested. Ears:  Normal  auditory acuity. Nose:  No deformity, discharge, or lesions. Mouth:  No deformity or lesions,oropharynx pink & moist. Neck:  Supple; no masses or thyromegaly. Lungs:  Respirations even and unlabored.  Clear throughout to auscultation.   No wheezes, crackles, or rhonchi. No acute distress. Heart:  Regular rate and rhythm; no murmurs, clicks, rubs, or gallops. Abdomen:  Normal bowel sounds. Soft, non-tender and non-distended without masses, hepatosplenomegaly or hernias noted.  No guarding or rebound tenderness.   Rectal: Not performed Msk: muscle wasting in bilateral lower extremities, contractures in left upper and lower extremity  Pulses:  Normal pulses noted. Extremities:  No clubbing or edema.  No cyanosis. Neurologic:  Alert and oriented x1; Skin:  Intact without significant lesions or rashes. No jaundice. Psych:  Alert and cooperative  Imaging Studies: reviewed  Assessment and Plan:   KANYEN BOEGER is a 67 y.o. Caucasian male with history of cerebral palsy, bedbound since  2012 after left hip and femur fracture, history of DVT, on rivaroxaban is seen in consultation for unexplained iron deficiency anemia. Patient is overdue for his colonoscopy. Also, given that he has history of unexplained iron deficiency anemia, ideally colonoscopy is warranted primarily to rule out colon cancer. However, given his medical condition and impaired functional status I discussed with patient's sister about the risks and benefits of procedure. He may not be a surgical candidate or would be able to tolerate chemotherapy if he truly has colon cancer. She would like to pursue the colonoscopy  - bowel prep instructions were provided with 2-day prep  I have discussed alternative options, risks & benefits,  which include, but are not limited to, bleeding, infection, perforation,respiratory complication & drug reaction.  The patient's sister agrees with this plan & written consent will be obtained.    Will refer  back to Dr. Smith Qadir for parenteral iron therapy for iron deficiency anemia Recommend to check B12 and folate levels as well   Follow up at the time of colonoscopy   Arlyss Repress, MD

## 2022-08-31 NOTE — Telephone Encounter (Signed)
Pt sister notified verbalized understanding.

## 2022-09-01 ENCOUNTER — Telehealth: Payer: Self-pay

## 2022-09-01 DIAGNOSIS — D509 Iron deficiency anemia, unspecified: Secondary | ICD-10-CM

## 2022-09-01 NOTE — Telephone Encounter (Signed)
Placed referral to Dr. Smith Grayton

## 2022-09-01 NOTE — Telephone Encounter (Signed)
-----   Message from Toney Reil, MD sent at 09/01/2022  4:17 PM EDT ----- Regarding: Cancel EGD Morrie Sheldon  Please cancel his upper endoscopy.  Keep the colonoscopy as it is  Thanks RV

## 2022-09-01 NOTE — Telephone Encounter (Signed)
Called ENDO and talk to [place] and she states she will cancel the EGD and keep the colon.

## 2022-09-01 NOTE — Telephone Encounter (Signed)
-----   Message from Toney Reil, MD sent at 09/01/2022  4:20 PM EDT ----- Regarding: Referral to oncology Please refer him back to Dr. Smith Jylan for parenteral iron therapy for iron deficiency anemia  RV

## 2022-09-03 ENCOUNTER — Telehealth: Payer: Self-pay | Admitting: Family Medicine

## 2022-09-03 NOTE — Telephone Encounter (Signed)
Copied from CRM [id number]. Topic: General - Other >> Sep 03, 2022 10:13 AM Franchot Heidelberg wrote: Morrie Sheldon called to report that she is faxing over another document requiring PCP's signed response. The patient has a colonoscopy April 24th and they need know when to stop his Xarelto.   Morrie Sheldon from [facility] Dr. Verdis Prime office  [phone]

## 2022-09-03 NOTE — Telephone Encounter (Signed)
Called Morrie Sheldon back to let her know papers were faxed yesterday.

## 2022-09-03 NOTE — Telephone Encounter (Signed)
Miel, only faxed the papers related to his foley supplies. The ones Gi needing are currently on your desk.

## 2022-09-03 NOTE — Telephone Encounter (Signed)
Forms on your desk.

## 2022-09-04 ENCOUNTER — Telehealth: Payer: Self-pay

## 2022-09-04 NOTE — Telephone Encounter (Signed)
Faxed to Morrie Sheldon this am

## 2022-09-04 NOTE — Telephone Encounter (Signed)
Per PCP provide Gerald Berry PA patient needs to stop the Xarelto 2 days before procedure and then restart the medication 2 days after the procedure. And informed patient sister of the information and she verbalized understanding of results.

## 2022-09-07 ENCOUNTER — Ambulatory Visit: Payer: Self-pay

## 2022-09-07 ENCOUNTER — Encounter: Payer: Self-pay | Admitting: Oncology

## 2022-09-07 ENCOUNTER — Telehealth: Payer: Self-pay | Admitting: Family Medicine

## 2022-09-07 NOTE — Telephone Encounter (Signed)
Patient's sister called, she says she's unable to get in touch with the urology office. I tried to connect her to them, message was left. I disconnected the call and lost the patient's sister on the line. I called her back, left VM to return the call to the office.

## 2022-09-07 NOTE — Telephone Encounter (Signed)
Spoke to patient's sister and scheduled patient to come in for SPT change. She states he has silicone catheters and they are not what she usually uses and needs help with getting the catheter out. She pushed the catheter back in to the bladder and filled the balloon back up.

## 2022-09-07 NOTE — Telephone Encounter (Signed)
Summary: Cathter Advice   Pts sister is calling to report that she is trying to take the patient's cathter out. And it will not come out. Pt is requesting a nurse to come out or needing assistance. Please advise        Called sister - left message on machine to return call.

## 2022-09-07 NOTE — Telephone Encounter (Signed)
Summary: Cathter Advice   Pts sister is calling to report that she is trying to take the patient's cathter out. And it will not come out. Pt is requesting a nurse to come out or needing assistance. Please advise        Called pt's sister - Voice mail not set up unable to [facility].

## 2022-09-07 NOTE — Telephone Encounter (Signed)
Patient's sister LMOM states she is having trouble getting the catheter out. She said she has drained all of the water out and has pulled and pulled but it won't come out. I returned call and left a message for her to call back.

## 2022-09-07 NOTE — Telephone Encounter (Addendum)
  Chief Complaint: patient has catheter- 16/30cc- sister states she bathed patient and went to drain- she could only get 5cc out. She states the catheter is still working properly- but can not get anymore fluid out of the balloon.  Symptoms: unable to draw fluid out of catheter balloon   Pertinent Negatives: Patient denies abdomen swelling, back pain, bladder spasms, constipation, foul smelling urine, leaking of urine Disposition: [] ED /[] Urgent Care (no appt availability in office) / [] Appointment(In office/virtual)/ []  [place] [facility]/ [] Home Care/ [x] Refused Recommended Disposition /[] [facility] [facility]/ []  Follow-up with PCP Additional Notes: Ms. Gerald Montgomery is going to check with urology to see if they can help her- I did advise I would send note to provider for any additional tips/help with this.  She declined to make an appointment due to transportation needs- she states she will call back if needed.  Reason for Disposition  Leakage of urine around catheter  Answer Assessment - Initial Assessment Questions 1. SYMPTOMS: "What symptoms are you concerned about?"     Unable to get catheter to come out- sister believes that all the water is not drained out 2. ONSET:  "When did the symptoms start?"     today 3. FEVER: "Is there a fever?" If Yes, ask: "What is the temperature, how was it measured, and when did it start?"     no 4. ABDOMEN PAIN: "Is there any abdomen pain?" (e.g., Scale 1-10; or mild, moderate, severe)     no 5. URINE COLOR: "What color is the urine?"  "Is there blood present in the urine?" (e.g., clear, yellow, cloudy, tea-colored, blood streaks, bright red)     clear 6. URINE AMOUNT: "When did you last empty the urine from the collection bag?" "How much urine was in the bag at that time?" How much urine is in the collection bag now?"     No changes in urine amount- draining well 7. INSERTION: "How long have you (they) had the catheter?"     Du e to be changed  tomorrow 8. OTHER SYMPTOMS: "Are there any other symptoms?" (e.g., abdomen swelling, back pain, bladder spasms, constipation, foul smelling urine, leaking of urine)      No other symptoms 9. MEDICINES: "Are you taking any medicines to treat urinary problems?" (e.g., antibiotics for a urinary tract infection, medicines to treat bladder spasms)      No changes in medication  Protocols used: Urinary Catheter (e.g., Foley) Symptoms and Questions-A-AH

## 2022-09-08 NOTE — Telephone Encounter (Signed)
I called [facility]- no longer patient there. I also called [facility]- he is not a patient there either due to pt sister telling them she only wanted Catheter supplies thorugh them, she could handle everything else.   I called pt sister to see if she had received help from Urology dept. No answer unable to leave vm.

## 2022-09-09 ENCOUNTER — Ambulatory Visit (INDEPENDENT_AMBULATORY_CARE_PROVIDER_SITE_OTHER): Payer: Medicare PPO | Admitting: Physician Assistant

## 2022-09-09 DIAGNOSIS — N319 Neuromuscular dysfunction of bladder, unspecified: Secondary | ICD-10-CM | POA: Diagnosis not present

## 2022-09-09 DIAGNOSIS — R339 Retention of urine, unspecified: Secondary | ICD-10-CM | POA: Diagnosis not present

## 2022-09-09 NOTE — Progress Notes (Addendum)
09/09/2022 12:57 PM   Gerald Montgomery 07-20-1955 [id number]  CC: Chief Complaint  Patient presents with   Other   HPI: Gerald Montgomery is a 67 y.o. male with PMH neurogenic bladder managed with SPT and high risk hematuria with benign CTU in 2017 and cystoscopy use and 2017 and 2020 who presents today for evaluation of difficulty removing SP tube.  He is accompanied today by his sister, [name], who contributes to HPI.  Today they report that [name] has been performing his SP tube changes at home approximately every 3 weeks.  They have been getting catheter supplies delivered from [facility].  He has a history of urinary encrustation, however they have been able to manage things well with more frequent tube changes and manual catheter irrigation.  She attempted to exchange his silicone SPT 2 days ago but had significant difficulty removing the tube.  She reports she inflated the balloon with the syringe that was inside the Foley kit.  PMH: Past Medical History:  Diagnosis Date   Cerebral palsy    Depression    GERD (gastroesophageal reflux disease)    History of DVT of lower extremity    Mood disorder    Neurogenic bladder    Urinary incontinence     Surgical History: Past Surgical History:  Procedure Laterality Date   COLONOSCOPY WITH PROPOFOL N/A 12/27/2017   Procedure: COLONOSCOPY WITH PROPOFOL;  Surgeon: Toney Reil, MD;  Location: [facility] ENDOSCOPY;  Service: Gastroenterology;  Laterality: N/A;   ESOPHAGOGASTRODUODENOSCOPY Left 02/07/2017   Procedure: ESOPHAGOGASTRODUODENOSCOPY (EGD);  Surgeon: Tressia Danas, MD;  Location: [facility] ENDOSCOPY;  Service: Gastroenterology;  Laterality: Left;   ESOPHAGOGASTRODUODENOSCOPY (EGD) WITH PROPOFOL  12/27/2017   Procedure: ESOPHAGOGASTRODUODENOSCOPY (EGD) WITH PROPOFOL;  Surgeon: Toney Reil, MD;  Location: [facility] ENDOSCOPY;  Service: Gastroenterology;;   filter surgery for blood clot  2013   HIP FRACTURE SURGERY   2013   IR CATHETER TUBE CHANGE  07/07/2017    Home Medications:  Allergies as of 09/09/2022       Reactions   Macrobid [nitrofurantoin]    Penicillins    Sickness/sweating   Sulfa Antibiotics Itching        Medication List        Accurate as of September 09, 2022 12:57 PM. If you have any questions, ask your nurse or doctor.          ARIPiprazole 15 MG tablet Commonly known as: ABILIFY Take 1 tablet (15 mg total) by mouth daily.   citalopram 20 MG tablet Commonly known as: CELEXA Take 1 tablet (20 mg total) by mouth daily.   fexofenadine 180 MG tablet Commonly known as: ALLEGRA Take 180 mg by mouth daily.   fluticasone 50 MCG/ACT nasal spray Commonly known as: FLONASE Place into both nostrils daily.   oxybutynin 5 MG tablet Commonly known as: DITROPAN Take 1 tablet (5 mg total) by mouth 2 (two) times daily.   pantoprazole 20 MG tablet Commonly known as: PROTONIX Take 1 tablet (20 mg total) by mouth daily before breakfast.   rivaroxaban 10 MG Tabs tablet Commonly known as: Xarelto Take 1 tablet (10 mg total) by mouth daily.   rosuvastatin 5 MG tablet Commonly known as: Crestor Take 1 tablet (5 mg total) by mouth daily.        Allergies:  Allergies  Allergen Reactions   Macrobid [Nitrofurantoin]    Penicillins     Sickness/sweating   Sulfa Antibiotics Itching    Family History:  Family History  Problem Relation Age of Onset   Heart disease Mother    Atrial fibrillation Mother    Diabetes Mother        Borderline   Heart disease Father        Passed away of heart attack   Kidney disease Neg Hx    Prostate cancer Neg Hx     Social History:   reports that he has never smoked. He has never been exposed to tobacco smoke. He has never used smokeless tobacco. He reports that he does not drink alcohol and does not use drugs.  Physical Exam: There were no vitals taken for this visit.  Constitutional:  Alert and oriented, no acute distress,  nontoxic appearing HEENT: [place], AT Cardiovascular: No clubbing, cyanosis, or edema Respiratory: Normal respiratory effort, no increased work of breathing Skin: No rashes, bruises or suspicious lesions Neurologic: In wheelchair Psychiatric: Normal mood and affect  Suprapubic Cath Change  Patient is present today for a suprapubic catheter change due to urinary retention.  26ml of water was drained from the balloon, a 16FR silicone foley cath was removed from the tract with some difficulty due to misfolded, decompressed Foley balloon.  Site was cleaned and prepped in a sterile fashion with betadine.  A 16FR Silastic foley cath was replaced into the tract no complications were noted. Urine return was noted, 10 ml of sterile water was inflated into the balloon and a night bag was attached for drainage.  Patient tolerated well.    Performed by: Carman Ching, PA-C   Assessment & Plan:   1. Neurogenic bladder SPT exchanged in clinic today, see procedure note above.  I did have significant difficulty removing the tube, but was ultimately able to get it out in its entirety.  Tube exchanged easily.  Will plan to see him back for annual follow-up next year.  They prefer virtual visits.  I contacted their home health delivery service to discuss increasing their supply order to 2 catheters, 2 Foley kits, 2 night bags, 2 leg straps, and 2 irrigation kits monthly due to his history of urinary encrustation requiring more frequent catheter exchanges.  Return in about 1 year (around 09/09/2023) for Annual f/u virtual visit.  Carman Ching, PA-C  [facility] [place] [address] [place], [place] [address] [phone]

## 2022-09-09 NOTE — Progress Notes (Signed)
Pt was seen by urology - they recommend supply amount changes - we can submit their note with change in our orders to the supply company to help get them the adequate amount.  Hopefully that will help the recent supply problems

## 2022-09-15 ENCOUNTER — Telehealth: Payer: Self-pay

## 2022-09-15 NOTE — Telephone Encounter (Signed)
Patient sister called and left a voicemail because she states she does not live with the patient and she can not give him the prep at 5 hours before procedure because that would be 5:00am and she does not live with him. She states she is only going to give patient the one does because he will be clean out enough. Patient states transportation is going to be at his house at 9:00 so he can [place] medical [place] 11. Tried to call sister back and left a message for call back.

## 2022-09-16 ENCOUNTER — Ambulatory Visit
Admission: RE | Admit: 2022-09-16 | Discharge: 2022-09-16 | Disposition: A | Discharge status: Home / Self Care | Payer: Medicare PPO | Attending: Gastroenterology | Admitting: Gastroenterology

## 2022-09-16 ENCOUNTER — Ambulatory Visit: Payer: Medicare PPO | Admitting: Anesthesiology

## 2022-09-16 ENCOUNTER — Encounter: Payer: Self-pay | Admitting: Gastroenterology

## 2022-09-16 ENCOUNTER — Encounter
Admission: RE | Disposition: A | Discharge status: Home / Self Care | Payer: Self-pay | Source: Home / Self Care | Attending: Gastroenterology

## 2022-09-16 DIAGNOSIS — G809 Cerebral palsy, unspecified: Secondary | ICD-10-CM | POA: Insufficient documentation

## 2022-09-16 DIAGNOSIS — Z7901 Long term (current) use of anticoagulants: Secondary | ICD-10-CM | POA: Diagnosis not present

## 2022-09-16 DIAGNOSIS — F32A Depression, unspecified: Secondary | ICD-10-CM | POA: Diagnosis not present

## 2022-09-16 DIAGNOSIS — K219 Gastro-esophageal reflux disease without esophagitis: Secondary | ICD-10-CM | POA: Diagnosis not present

## 2022-09-16 DIAGNOSIS — E785 Hyperlipidemia, unspecified: Secondary | ICD-10-CM | POA: Diagnosis not present

## 2022-09-16 DIAGNOSIS — D509 Iron deficiency anemia, unspecified: Secondary | ICD-10-CM | POA: Diagnosis not present

## 2022-09-16 DIAGNOSIS — K635 Polyp of colon: Secondary | ICD-10-CM | POA: Diagnosis not present

## 2022-09-16 DIAGNOSIS — D126 Benign neoplasm of colon, unspecified: Secondary | ICD-10-CM | POA: Diagnosis not present

## 2022-09-16 DIAGNOSIS — Q438 Other specified congenital malformations of intestine: Secondary | ICD-10-CM | POA: Diagnosis not present

## 2022-09-16 HISTORY — PX: COLONOSCOPY WITH PROPOFOL: SHX5780

## 2022-09-16 SURGERY — COLONOSCOPY WITH PROPOFOL
Anesthesia: General

## 2022-09-16 MED ORDER — SODIUM CHLORIDE 0.9 % IV SOLN: INTRAVENOUS | Status: DC

## 2022-09-16 MED ORDER — PROPOFOL 500 MG/50ML IV EMUL
INTRAVENOUS | Status: DC | PRN
  Administered 2022-09-16: 75 ug/kg/min via INTRAVENOUS

## 2022-09-16 MED ORDER — PROPOFOL 10 MG/ML IV BOLUS
INTRAVENOUS | Status: AC
  Filled 2022-09-16: qty 20

## 2022-09-16 MED ORDER — PROPOFOL 10 MG/ML IV BOLUS
INTRAVENOUS | Status: DC | PRN
  Administered 2022-09-16: 70 mg via INTRAVENOUS

## 2022-09-16 MED ORDER — LIDOCAINE HCL (CARDIAC) PF 100 MG/5ML IV SOSY
PREFILLED_SYRINGE | INTRAVENOUS | Status: DC | PRN
  Administered 2022-09-16: 50 mg via INTRAVENOUS

## 2022-09-16 NOTE — Op Note (Signed)
[facility] Gastroenterology Patient Name: Gerald Montgomery Procedure Date: 09/16/2022 11:48 AM MRN: [id number] Account #: [id number] Date of Birth: Nov 11, 1955 Admit Type: Outpatient Age: 67 Room: [facility] ENDO ROOM 4 Gender: Male Note Status: Finalized Instrument Name: Peds Colonoscope [id number] Procedure:             Colonoscopy Indications:           Unexplained iron deficiency anemia Providers:             Toney Reil MD, MD Referring MD:          Toney Reil MD, MD (Referring MD) Medicines:             General Anesthesia Complications:         No immediate complications. Estimated blood loss: None. Procedure:             Pre-Anesthesia Assessment:                        - Prior to the procedure, a History and Physical was                         performed, and patient medications and allergies were                         reviewed. The patient is unable to give consent                         secondary to the patient being legally incompetent to                         consent. The risks and benefits of the procedure and                         the sedation options and risks were discussed with the                         patient's sister. All questions were answered and                         informed consent was obtained. Patient identification                         and proposed procedure were verified by the physician,                         the nurse, the anesthesiologist, the anesthetist and                         the technician in the pre-procedure area in the                         procedure room in the endoscopy suite. Mental Status                         Examination: alert and oriented. Airway Examination:                         normal oropharyngeal airway and neck mobility.  Respiratory Examination: clear to auscultation. CV                         Examination: normal. Prophylactic Antibiotics: The                          patient does not require prophylactic antibiotics.                         Prior Anticoagulants: The patient has taken Xarelto                         (rivaroxaban), last dose was 2 days prior to                         procedure. ASA Grade Assessment: III - A patient with                         severe systemic disease. After reviewing the risks and                         benefits, the patient was deemed in satisfactory                         condition to undergo the procedure. The anesthesia                         plan was to use general anesthesia. Immediately prior                         to administration of medications, the patient was                         re-assessed for adequacy to receive sedatives. The                         heart rate, respiratory rate, oxygen saturations,                         blood pressure, adequacy of pulmonary ventilation, and                         response to care were monitored throughout the                         procedure. The physical status of the patient was                         re-assessed after the procedure.                        After obtaining informed consent, the colonoscope was                         passed under direct vision. Throughout the procedure,                         the patient's blood pressure, pulse, and oxygen  saturations were monitored continuously. The                         Colonoscope was introduced through the anus and                         advanced to the the terminal ileum, with                         identification of the appendiceal orifice and IC                         valve. The colonoscopy was unusually difficult due to                         poor bowel prep and significant looping. Successful                         completion of the procedure was aided by applying                         abdominal pressure and lavage. The patient tolerated                          the procedure well. The quality of the bowel                         preparation was inadequate. The terminal ileum,                         ileocecal valve, appendiceal orifice, and rectum were                         photographed. Findings:      The perianal and digital rectal examinations were normal. Pertinent       negatives include normal sphincter tone and no palpable rectal lesions.      The terminal ileum appeared normal.      A diminutive polyp was found in the sigmoid colon. The polyp was       sessile. The polyp was removed with a jumbo cold forceps. Resection and       retrieval were complete. Estimated blood loss: none.      Normal mucosa was found in the entire colon.      The retroflexed view of the distal rectum and anal verge was normal and       showed no anal or rectal abnormalities. Impression:            - Preparation of the colon was inadequate.                        - The examined portion of the ileum was normal.                        - One diminutive polyp in the sigmoid colon, removed                         with a jumbo cold forceps. Resected and retrieved.                        -  Normal mucosa in the entire examined colon.                        - The distal rectum and anal verge are normal on                         retroflexion view. Recommendation:        - Discharge patient to home (with escort).                        - Resume previous diet today.                        - Continue present medications.                        - Await pathology results.                        - Resume Xarelto (rivaroxaban) today at prior dose.                         Refer to managing physician for further adjustment of                         therapy. Procedure Code(s):     --- Professional ---                        [phone], Colonoscopy, flexible; with biopsy, single or                         multiple Diagnosis Code(s):     --- Professional ---                         D12.5, Benign neoplasm of sigmoid colon                        D50.9, Iron deficiency anemia, unspecified CPT copyright 2022 American Medical Association. All rights reserved. The codes documented in this report are preliminary and upon coder review may  be revised to meet current compliance requirements. Dr. Libby Maw Toney Reil MD, MD 09/16/2022 12:30:07 PM This report has been signed electronically. Number of Addenda: 0 Note Initiated On: 09/16/2022 11:48 AM Scope Withdrawal Time: 0 hours 10 minutes 28 seconds  Total Procedure Duration: 0 hours 17 minutes 19 seconds  Estimated Blood Loss:  Estimated blood loss: none.      [facility]

## 2022-09-16 NOTE — Transfer of Care (Signed)
Immediate Anesthesia Transfer of Care Note  Patient: Gerald Montgomery  Procedure(s) Performed: COLONOSCOPY WITH PROPOFOL  Patient Location: PACU and Endoscopy Unit  Anesthesia Type:General  Level of Consciousness: awake, alert , and oriented  Airway & Oxygen Therapy: Patient Spontanous Breathing  Post-op Assessment: Report given to RN and Post -op Vital signs reviewed and stable  Post vital signs: Reviewed and stable  Last Vitals:  Vitals Value Taken Time  BP 137/83 1236  Temp 97 1236  Pulse 75 1236  Resp 22 1236  SpO2 98 1236    Last Pain:  Vitals:   09/16/22 1049  TempSrc: Temporal         Complications: No notable events documented.

## 2022-09-16 NOTE — H&P (Signed)
Arlyss Repress, MD [address]  [address]  [place], [place] [address]  Main: [phone]  Fax: [phone] Pager: [phone]  Primary Care Physician:  Danelle Berry, PA-C Primary Gastroenterologist:  Dr. Arlyss Repress  Pre-Procedure History & Physical: HPI:  Gerald Montgomery is a 67 y.o. male is here for an colonoscopy.   Past Medical History:  Diagnosis Date   Cerebral palsy    Depression    GERD (gastroesophageal reflux disease)    History of DVT of lower extremity    Mood disorder    Neurogenic bladder    Urinary incontinence     Past Surgical History:  Procedure Laterality Date   COLONOSCOPY WITH PROPOFOL N/A 12/27/2017   Procedure: COLONOSCOPY WITH PROPOFOL;  Surgeon: Toney Reil, MD;  Location: [facility] ENDOSCOPY;  Service: Gastroenterology;  Laterality: N/A;   ESOPHAGOGASTRODUODENOSCOPY Left 02/07/2017   Procedure: ESOPHAGOGASTRODUODENOSCOPY (EGD);  Surgeon: Tressia Danas, MD;  Location: [facility] ENDOSCOPY;  Service: Gastroenterology;  Laterality: Left;   ESOPHAGOGASTRODUODENOSCOPY (EGD) WITH PROPOFOL  12/27/2017   Procedure: ESOPHAGOGASTRODUODENOSCOPY (EGD) WITH PROPOFOL;  Surgeon: Toney Reil, MD;  Location: [facility] ENDOSCOPY;  Service: Gastroenterology;;   filter surgery for blood clot  2013   HIP FRACTURE SURGERY  2013   IR CATHETER TUBE CHANGE  07/07/2017    Prior to Admission medications   Medication Sig Start Date End Date Taking? Authorizing Provider  ARIPiprazole (ABILIFY) 15 MG tablet Take 1 tablet (15 mg total) by mouth daily. 03/26/22   Danelle Berry, PA-C  citalopram (CELEXA) 20 MG tablet Take 1 tablet (20 mg total) by mouth daily. 03/26/22   Danelle Berry, PA-C  fexofenadine (ALLEGRA) 180 MG tablet Take 180 mg by mouth daily.    [provider]  fluticasone (FLONASE) 50 MCG/ACT nasal spray Place into both nostrils daily.    [provider]  oxybutynin (DITROPAN) 5 MG tablet Take 1 tablet (5 mg total) by mouth 2 (two) times  daily. 09/04/21   Michiel Cowboy A, PA-C  pantoprazole (PROTONIX) 20 MG tablet Take 1 tablet (20 mg total) by mouth daily before breakfast. 03/26/22   Danelle Berry, PA-C  rivaroxaban (XARELTO) 10 MG TABS tablet Take 1 tablet (10 mg total) by mouth daily. 03/26/22   Danelle Berry, PA-C  rosuvastatin (CRESTOR) 5 MG tablet Take 1 tablet (5 mg total) by mouth daily. 03/26/22   Danelle Berry, PA-C    Allergies as of 08/31/2022 - Review Complete 08/31/2022  Allergen Reaction Noted   Macrobid [nitrofurantoin]  06/07/2017   Penicillins  10/25/2015   Sulfa antibiotics Itching 10/29/2015    Family History  Problem Relation Age of Onset   Heart disease Mother    Atrial fibrillation Mother    Diabetes Mother        Borderline   Heart disease Father        Passed away of heart attack   Kidney disease Neg Hx    Prostate cancer Neg Hx     Social History   Socioeconomic History   Marital status: Single    Spouse name: Not on file   Number of children: Not on file   Years of education: Not on file   Highest education level: Not on file  Occupational History   Occupation: disabled  Tobacco Use   Smoking status: Never    Passive exposure: Never   Smokeless tobacco: Never  Vaping Use   Vaping Use: Never used  Substance and Sexual Activity   Alcohol use: No  Alcohol/week: 0.0 standard drinks of alcohol   Drug use: No   Sexual activity: Never  Other Topics Concern   Not on file  Social History Narrative   Not on file   Social Determinants of Health   Financial Resource Strain: Low Risk  (02/26/2022)   Overall Financial Resource Strain (CARDIA)    Difficulty of Paying Living Expenses: Not hard at all  Food Insecurity: No Food Insecurity (02/26/2022)   Hunger Vital Sign    Worried About Running Out of Food in the Last Year: Never true    Ran Out of Food in the Last Year: Never true  Transportation Needs: No Transportation Needs (02/26/2022)   PRAPARE - Scientist, research (physical sciences) (Medical): No    Lack of Transportation (Non-Medical): No  Physical Activity: Inactive (02/26/2022)   Exercise Vital Sign    Days of Exercise per Week: 0 days    Minutes of Exercise per Session: 0 min  Stress: No Stress Concern Present (02/26/2022)   [employer] of Occupational Health - Occupational Stress Questionnaire    Feeling of Stress : Not at all  Social Connections: Socially Isolated (02/26/2022)   Social Connection and Isolation Panel [NHANES]    Frequency of Communication with Friends and Family: More than three times a week    Frequency of Social Gatherings with Friends and Family: More than three times a week    Attends Religious Services: Never    Database administrator or Organizations: No    Attends Banker Meetings: Never    Marital Status: Widowed  Intimate Partner Violence: Not At Risk (02/26/2022)   Humiliation, Afraid, Rape, and Kick questionnaire    Fear of Current or Ex-Partner: No    Emotionally Abused: No    Physically Abused: No    Sexually Abused: No    Review of Systems: See HPI, otherwise negative ROS  Physical Exam: BP (!) 152/105   Pulse 99   Temp (!) 97.5 F (36.4 C) (Temporal)   Resp 17   Ht  (1.753 m)   Wt 73.9 kg   SpO2 97%   BMI 24.07 kg/m  General:   Alert,  pleasant and cooperative in NAD Head:  Normocephalic and atraumatic. Neck:  Supple; no masses or thyromegaly. Lungs:  Clear throughout to auscultation.    Heart:  Regular rate and rhythm. Abdomen:  Soft, nontender and nondistended. Normal bowel sounds, without guarding, and without rebound.   Neurologic:  Alert and  oriented x4;  grossly normal neurologically.  Impression/Plan: Gerald Montgomery is here for an colonoscopy to be performed for IDA  Risks, benefits, limitations, and alternatives regarding  colonoscopy have been reviewed with the patient.  Questions have been answered.  All parties agreeable.   Lannette Donath, MD  09/16/2022, 11:50  AM

## 2022-09-16 NOTE — Anesthesia Postprocedure Evaluation (Signed)
Anesthesia Post Note  Patient: ZELIG GACEK  Procedure(s) Performed: COLONOSCOPY WITH PROPOFOL  Patient location during evaluation: Endoscopy Anesthesia Type: General Level of consciousness: awake and alert Pain management: pain level controlled Vital Signs Assessment: post-procedure vital signs reviewed and stable Respiratory status: spontaneous breathing, nonlabored ventilation, respiratory function stable and patient connected to nasal cannula oxygen Cardiovascular status: blood pressure returned to baseline and stable Postop Assessment: no apparent nausea or vomiting Anesthetic complications: no   No notable events documented.   Last Vitals:  Vitals:   09/16/22 1236 09/16/22 1246  BP: 137/83 (!) 141/95  Pulse: 75 85  Resp: 18 19  Temp:  (!) 36.2 C  SpO2: 100% 98%    Last Pain:  Vitals:   09/16/22 1246  TempSrc: Temporal  PainSc: 0-No pain                 Corinda Gubler

## 2022-09-16 NOTE — Anesthesia Preprocedure Evaluation (Signed)
Anesthesia Evaluation  Patient identified by MRN, date of birth, ID band Patient awake and Patient confused    Reviewed: Allergy & Precautions, NPO status , Patient's Chart, lab work & pertinent test results  History of Anesthesia Complications Negative for: history of anesthetic complications  Airway Mallampati: III  TM Distance: >3 FB Neck ROM: Full    Dental  (+) Chipped, Poor Dentition   Pulmonary neg pulmonary ROS, neg sleep apnea, neg COPD, Patient abstained from smoking.Not current smoker   Pulmonary exam normal breath sounds clear to auscultation       Cardiovascular Exercise Tolerance: Good METS(-) hypertension(-) CAD and (-) Past MI negative cardio ROS (-) dysrhythmias  Rhythm:Regular Rate:Normal - Systolic murmurs    Neuro/Psych  PSYCHIATRIC DISORDERS  Depression     Neuromuscular disease    GI/Hepatic ,GERD  ,,(+)     (-) substance abuse    Endo/Other  neg diabetes    Renal/GU negative Renal ROS     Musculoskeletal   Abdominal   Peds  Hematology   Anesthesia Other Findings Past Medical History: No date: Cerebral palsy No date: Depression No date: GERD (gastroesophageal reflux disease) No date: History of DVT of lower extremity No date: Mood disorder No date: Neurogenic bladder No date: Urinary incontinence  Reproductive/Obstetrics                              Anesthesia Physical Anesthesia Plan  ASA: 3  Anesthesia Plan: General   Post-op Pain Management: Minimal or no pain anticipated   Induction: Intravenous  PONV Risk Score and Plan: 2 and Propofol infusion, TIVA and Ondansetron  Airway Management Planned: Nasal Cannula  Additional Equipment: None  Intra-op Plan:   Post-operative Plan:   Informed Consent: I have reviewed the patients History and Physical, chart, labs and discussed the procedure including the risks, benefits and alternatives for the  proposed anesthesia with the patient or authorized representative who has indicated his/her understanding and acceptance.     Dental advisory given and Consent reviewed with POA  Plan Discussed with: CRNA and Surgeon  Anesthesia Plan Comments: (Discussed risks of anesthesia with patient and his sister/legal guardian at bedside, including possibility of difficulty with spontaneous ventilation under anesthesia necessitating airway intervention, PONV, and rare risks such as cardiac or respiratory or neurological events, and allergic reactions. Discussed the role of CRNA in patient's perioperative care. Patient  and legal guardian understands.)         Anesthesia Quick Evaluation

## 2022-09-17 ENCOUNTER — Encounter: Payer: Self-pay | Admitting: Gastroenterology

## 2022-09-17 LAB — SURGICAL PATHOLOGY

## 2022-09-22 ENCOUNTER — Telehealth: Payer: Self-pay | Admitting: Family Medicine

## 2022-09-22 NOTE — Telephone Encounter (Signed)
Pt's sister called reporting that she wants to transfer the patient's urological supply needs to [employer], the current company is not providing them sufficient supplies. She is requesting a call back from the clinic to help get what she needs. She says that she was told by [facility] or [facility].   Best contact: [phone]

## 2022-09-22 NOTE — Telephone Encounter (Signed)
Would urology need this to be forward to them?

## 2022-09-24 NOTE — Telephone Encounter (Signed)
Called pt sister and she states she will call urology so they can just fax over the supplis order they had previously to the new company. FYI she thought she had called Urology.

## 2022-09-29 ENCOUNTER — Telehealth: Payer: Self-pay | Admitting: Family Medicine

## 2022-09-29 NOTE — Telephone Encounter (Signed)
Patient's sister called and states she has been getting supplies from [employer] but she has not gotten any leg straps or sterile water. She reached out to [employer] and there is no order for the items. I did inform her that his PCP was the last ones to do the orders for his supplies. She states she called his PCP and they stated she would have to contact us to request the items.

## 2022-10-01 DIAGNOSIS — D649 Anemia, unspecified: Secondary | ICD-10-CM | POA: Diagnosis not present

## 2022-10-01 DIAGNOSIS — Z125 Encounter for screening for malignant neoplasm of prostate: Secondary | ICD-10-CM | POA: Diagnosis not present

## 2022-10-01 DIAGNOSIS — E785 Hyperlipidemia, unspecified: Secondary | ICD-10-CM | POA: Diagnosis not present

## 2022-10-01 DIAGNOSIS — K219 Gastro-esophageal reflux disease without esophagitis: Secondary | ICD-10-CM | POA: Diagnosis not present

## 2022-11-04 DIAGNOSIS — G809 Cerebral palsy, unspecified: Secondary | ICD-10-CM | POA: Diagnosis not present

## 2022-11-04 DIAGNOSIS — R32 Unspecified urinary incontinence: Secondary | ICD-10-CM | POA: Diagnosis not present

## 2022-11-24 DIAGNOSIS — K219 Gastro-esophageal reflux disease without esophagitis: Secondary | ICD-10-CM | POA: Diagnosis not present

## 2022-11-24 DIAGNOSIS — E785 Hyperlipidemia, unspecified: Secondary | ICD-10-CM | POA: Diagnosis not present

## 2022-11-25 DIAGNOSIS — R32 Unspecified urinary incontinence: Secondary | ICD-10-CM | POA: Diagnosis not present

## 2022-11-25 DIAGNOSIS — G809 Cerebral palsy, unspecified: Secondary | ICD-10-CM | POA: Diagnosis not present

## 2022-12-09 DIAGNOSIS — K219 Gastro-esophageal reflux disease without esophagitis: Secondary | ICD-10-CM | POA: Diagnosis not present

## 2022-12-09 DIAGNOSIS — L219 Seborrheic dermatitis, unspecified: Secondary | ICD-10-CM | POA: Diagnosis not present

## 2022-12-24 DIAGNOSIS — G809 Cerebral palsy, unspecified: Secondary | ICD-10-CM | POA: Diagnosis not present

## 2022-12-24 DIAGNOSIS — R32 Unspecified urinary incontinence: Secondary | ICD-10-CM | POA: Diagnosis not present

## 2023-01-24 DIAGNOSIS — G809 Cerebral palsy, unspecified: Secondary | ICD-10-CM | POA: Diagnosis not present

## 2023-01-24 DIAGNOSIS — R32 Unspecified urinary incontinence: Secondary | ICD-10-CM | POA: Diagnosis not present

## 2023-02-02 ENCOUNTER — Telehealth: Payer: Self-pay | Admitting: Urology

## 2023-02-02 NOTE — Telephone Encounter (Signed)
Patient's sister [name]) called and stated that [employer] has sent silicone tubes which are very stiff and she states that she can't get these out. She needs the latex ones, which are flexible. She is requesting that our office contact [employer] to send the correct latex ones. The nurse that does home visits told her that we need to contact [employer].

## 2023-02-04 NOTE — Telephone Encounter (Signed)
Agree with awaiting sister following up with home health NP; happy to place a different order if needed but would like to avoid duplicate ordering unless we know it's okay per home health.

## 2023-02-04 NOTE — Telephone Encounter (Signed)
Spoke with sister and advised we did not place order for catheters. Per chart last order in June was placed by Danelle Berry, PA. After talking with sister it seems like the NP that does the home visits are placing orders for catheters. I tried explaining to sister I can't see those orders and would need to know what is being ordered, per sister she does not want to "mess up" the NP coming to the house and will speak with the NP. I also explained we could try and place a new order sister wanted [place] to hold off until she speak with NP.  (Spoke with sister for 17 mins)

## 2023-02-16 NOTE — Telephone Encounter (Signed)
I would recommend that patient sees Urology for appropriate catheter fitting so the orders can be updated and the patient can have adequate supply. I do not order catheters and would not be comfortable placing such an order.

## 2023-02-16 NOTE — Telephone Encounter (Signed)
.  left message to have patient's sister return my call. He is homebound and has home health. She wants a different agency for home health which urology does not order home health for just a catheter change.

## 2023-02-16 NOTE — Telephone Encounter (Signed)
Sister [name] calling b/c she has not heard from anyone.  She is getting quite frustrated as no one has called her back. She does want a new home health provider. Needs latex lines that go into pt's stomach   (Not silicone)  [name] is just torn on what to do and who to call.

## 2023-02-16 NOTE — Telephone Encounter (Signed)
Copied from CRM [id number]. Topic: General - Inquiry >> Feb 16, 2023 11:50 AM De Blanch wrote: Reason for CRM: Pt (Sister) Gerald Montgomery is calling frustrated and very upset and stated he needs to speak with a nurse today. Regarding pts issues with homecare and not having enough supplies. Mentioned that pt has spent all of his money on supplies because Julie,Barr refuses to order supplies because PCP Tapia placed orders. Stated order are wrong; pt doesn't have enough supplies.  She mentioned she doesn't want to deal with [name] anymore. (Sister) Gerald Montgomery is requesting a callback today.  Please advise.

## 2023-02-17 ENCOUNTER — Encounter: Payer: Self-pay | Admitting: Family Medicine

## 2023-02-17 NOTE — Telephone Encounter (Signed)
Patient called to get an update on the message she sent. Please advise.

## 2023-02-17 NOTE — Telephone Encounter (Signed)
Spoke with pt's sister again, she would like a new home health agency and new home health nurse practitioner. I explained that we can not order home health for just spt change. I asked if pt would like to start coming to our office for cath change? per sister they don't have a ramp and it's much easier at home. Sister will contact Dr. Angelica Chessman office to have them order a different home health agency to get the appropriate supplies they need. Per sister the home health nurse also helped with medications.

## 2023-02-18 NOTE — Telephone Encounter (Signed)
Patient uses [facility] supply place now. I called them and asked them to send the order for [place] to sign and take over for the patient. They will need OV note attached to the order also. Per them patient uses 16 FR foley, [employer] representative states they are sending the updated orders to [place] to review to give patient more supplies. Sister states she flushes his catheter daily. Changes his catheter every 3 weeks but sometimes its one extra time before the 3 week period. Uses at least 1 cath bag every 7 days, uses sterile water.  I updated sister with the information we received from [employer] and that we are waiting on the orders to review and fax back. She was very Adult nurse. She did say that the 16 fr catheters they have been sending are 100% silicone catheters and she has hard time with them sometimes getting stuck when she changes them and they seem to narrower than our 16 fr silicone catheter we used in April with patient and that one worked well. Advised I would let provider know if there is a possible difference and we will wait on orders to review that information also.

## 2023-02-18 NOTE — Telephone Encounter (Signed)
[name], patient's sister, called back and states she does not want a new home health agency. She just would like for [place] to get his order from [employer] to be updated for more just 2 supplies a month as they need sometimes more of the tray, irrigation, leg straps supplies. Per note from [facility] NP provider she wanted our office to take over the need for catheter and supplies. Scarlette Ar I would check with the provider to make sure it is ok to get orders sent to [place] for sign off and for the increase in quantity of material patient would like to have.

## 2023-02-18 NOTE — Telephone Encounter (Signed)
I am happy to submit updated orders to [facility] for his catheter supplies. I need to know the following information: -What size latex catheters is he using? -How often is his catheter being exchanged? -How often are they needing to irrigate his catheter, and what are they using to irrigate (sterile saline or water)?

## 2023-02-22 DIAGNOSIS — L219 Seborrheic dermatitis, unspecified: Secondary | ICD-10-CM | POA: Diagnosis not present

## 2023-02-22 DIAGNOSIS — D509 Iron deficiency anemia, unspecified: Secondary | ICD-10-CM | POA: Diagnosis not present

## 2023-02-22 DIAGNOSIS — K219 Gastro-esophageal reflux disease without esophagitis: Secondary | ICD-10-CM | POA: Diagnosis not present

## 2023-02-22 NOTE — Telephone Encounter (Signed)
Seeking clarity. I have an order from [employer] here to sign, but the note on it states his sister says that silicone catheters work better. Prior notes on this thread state she does not like the silicone catheters.  When I saw him in clinic in April, I placed a Silastic catheter. These are silicone coated latex, green in color. I am not positive if I can order these from [employer], though I can certainly try.  I need to know which type of catheter she prefers: silastic, latex, or silicone, in order of preference.

## 2023-02-22 NOTE — Telephone Encounter (Signed)
Asher Muir called pt sister this morning. We were told by [name] she will call us back with more information about [facility] and Catheter orders that were faxed to [place] by [facility].

## 2023-02-23 NOTE — Telephone Encounter (Signed)
Spoke with pt's sister, states they prefer silastic catheters and if we aren't able to get those, then latex would be their second option

## 2023-03-01 ENCOUNTER — Telehealth: Payer: Self-pay | Admitting: Physician Assistant

## 2023-03-01 NOTE — Telephone Encounter (Signed)
[employer] sent patient's sister an email stating that they faxed a prescription request to our office for patient to get his drain bags (night bags, no leg bags), and natural rubber latex; silicone coated catheters. Contact phone number for [employer] [phone]. Please advise patient's sister Magdalene Molly).

## 2023-03-03 DIAGNOSIS — G809 Cerebral palsy, unspecified: Secondary | ICD-10-CM | POA: Diagnosis not present

## 2023-03-03 DIAGNOSIS — R32 Unspecified urinary incontinence: Secondary | ICD-10-CM | POA: Diagnosis not present

## 2023-03-17 DIAGNOSIS — L219 Seborrheic dermatitis, unspecified: Secondary | ICD-10-CM | POA: Diagnosis not present

## 2023-03-17 DIAGNOSIS — K219 Gastro-esophageal reflux disease without esophagitis: Secondary | ICD-10-CM | POA: Diagnosis not present

## 2023-03-17 DIAGNOSIS — D509 Iron deficiency anemia, unspecified: Secondary | ICD-10-CM | POA: Diagnosis not present

## 2023-03-26 DIAGNOSIS — G809 Cerebral palsy, unspecified: Secondary | ICD-10-CM | POA: Diagnosis not present

## 2023-03-26 DIAGNOSIS — R32 Unspecified urinary incontinence: Secondary | ICD-10-CM | POA: Diagnosis not present

## 2023-04-12 ENCOUNTER — Encounter: Payer: Self-pay | Admitting: Oncology

## 2023-04-13 ENCOUNTER — Ambulatory Visit (LOCAL_COMMUNITY_HEALTH_CENTER): Payer: Medicare Other

## 2023-04-13 DIAGNOSIS — Z23 Encounter for immunization: Secondary | ICD-10-CM | POA: Diagnosis not present

## 2023-04-13 DIAGNOSIS — Z719 Counseling, unspecified: Secondary | ICD-10-CM

## 2023-04-13 NOTE — Progress Notes (Signed)
Homevisit made to administer flu vaccine as requested by pt's sister who is his caretaker.  See vaccine administration record/screening form (sent for scanning).  VISs given.  Reviewed post vaccine care.  Fluzone high dose administered IM by Marin Roberts, RN; tolerated well.    Copy of updated NCIR provided.  Cherlynn Polo, RN

## 2023-04-15 ENCOUNTER — Telehealth: Payer: Self-pay | Admitting: Family Medicine

## 2023-04-15 NOTE — Telephone Encounter (Signed)
Faxed OV notes to number provided

## 2023-04-15 NOTE — Telephone Encounter (Signed)
Copied from CRM [id number]. Topic: General - Other >> Apr 15, 2023 12:00 PM Ja-Kwan M wrote: Reason for CRM: Onalee Hua with [facility] [employer] requests that the last office notes be faxed to [phone]

## 2023-04-16 ENCOUNTER — Encounter: Payer: Self-pay | Admitting: Oncology

## 2023-04-19 DIAGNOSIS — E785 Hyperlipidemia, unspecified: Secondary | ICD-10-CM | POA: Diagnosis not present

## 2023-04-19 DIAGNOSIS — K219 Gastro-esophageal reflux disease without esophagitis: Secondary | ICD-10-CM | POA: Diagnosis not present

## 2023-04-19 DIAGNOSIS — D509 Iron deficiency anemia, unspecified: Secondary | ICD-10-CM | POA: Diagnosis not present

## 2023-04-25 DIAGNOSIS — G809 Cerebral palsy, unspecified: Secondary | ICD-10-CM | POA: Diagnosis not present

## 2023-04-25 DIAGNOSIS — R32 Unspecified urinary incontinence: Secondary | ICD-10-CM | POA: Diagnosis not present

## 2023-04-26 DIAGNOSIS — G809 Cerebral palsy, unspecified: Secondary | ICD-10-CM | POA: Diagnosis not present

## 2023-04-26 DIAGNOSIS — R32 Unspecified urinary incontinence: Secondary | ICD-10-CM | POA: Diagnosis not present

## 2023-05-13 ENCOUNTER — Telehealth: Payer: Self-pay | Admitting: Physician Assistant

## 2023-05-13 NOTE — Telephone Encounter (Signed)
Received call from [facility] with [facility]. She was calling to ask if we received office note request that they faxed to our office on 05/10/23. She said they are needing notes to support the order for 4 Foley catheters per month, when insurance usually only covers 1 per month. Also, 4 bedside drainage bags were ordered, and insurance usually only covers 2. They need office notes supporting the request for extra supplies. They are re-faxing  the request also.

## 2023-05-14 NOTE — Telephone Encounter (Signed)
Updated documented sent to [facility].  Confirmed with pt's sister about cath change. Pt's sister states they only need 2 cath per month, states they only want more irrigation trays and leg bands.

## 2023-05-27 DIAGNOSIS — R32 Unspecified urinary incontinence: Secondary | ICD-10-CM | POA: Diagnosis not present

## 2023-05-27 DIAGNOSIS — G809 Cerebral palsy, unspecified: Secondary | ICD-10-CM | POA: Diagnosis not present

## 2023-06-09 DIAGNOSIS — K219 Gastro-esophageal reflux disease without esophagitis: Secondary | ICD-10-CM | POA: Diagnosis not present

## 2023-06-09 DIAGNOSIS — E785 Hyperlipidemia, unspecified: Secondary | ICD-10-CM | POA: Diagnosis not present

## 2023-06-09 DIAGNOSIS — D509 Iron deficiency anemia, unspecified: Secondary | ICD-10-CM | POA: Diagnosis not present

## 2023-06-26 DIAGNOSIS — G809 Cerebral palsy, unspecified: Secondary | ICD-10-CM | POA: Diagnosis not present

## 2023-06-26 DIAGNOSIS — R32 Unspecified urinary incontinence: Secondary | ICD-10-CM | POA: Diagnosis not present

## 2023-06-28 DIAGNOSIS — G809 Cerebral palsy, unspecified: Secondary | ICD-10-CM | POA: Diagnosis not present

## 2023-06-28 DIAGNOSIS — R32 Unspecified urinary incontinence: Secondary | ICD-10-CM | POA: Diagnosis not present

## 2023-07-22 ENCOUNTER — Telehealth: Payer: Self-pay

## 2023-07-22 NOTE — Telephone Encounter (Signed)
 Copied from CRM [id number]. Topic: General - Other >> Jul 22, 2023  8:51 AM Patsy Lager T wrote: Reason for CRM: Ms Gerald Montgomery patients sister called wanting to know if Danelle Berry sent [name] out to see patient. They are not happy with her as she has called to cancel more times than she has come to see him. Please f/u with Ms Gerald Montgomery.

## 2023-07-22 NOTE — Telephone Encounter (Signed)
 Called sister- Ms. Zonia Kief w/no answer. Lvm informed her we do not know [name]. She will have to contact the agency to have a new aid sent out.

## 2023-08-26 ENCOUNTER — Telehealth: Payer: Self-pay

## 2023-08-26 NOTE — Telephone Encounter (Signed)
 Pt's sister calls triage line and states that [employer] needs and updated prescription for incontinence supplies including 4 night bags per month. Fax scanned in media.

## 2023-08-26 NOTE — Telephone Encounter (Signed)
 Pt will need a office visit to document the increase in catheters. Appt made for tomorrow, sister voiced understanding.

## 2023-08-27 ENCOUNTER — Telehealth (INDEPENDENT_AMBULATORY_CARE_PROVIDER_SITE_OTHER): Admitting: Physician Assistant

## 2023-08-27 DIAGNOSIS — N319 Neuromuscular dysfunction of bladder, unspecified: Secondary | ICD-10-CM

## 2023-08-27 DIAGNOSIS — R8290 Unspecified abnormal findings in urine: Secondary | ICD-10-CM | POA: Diagnosis not present

## 2023-08-27 NOTE — Progress Notes (Signed)
 Virtual Visit via Video Note  I connected with Gerald Montgomery on 08/27/23 at  4:00 PM EDT by a video enabled telemedicine application and verified that I am speaking with the correct person using two identifiers.  Location: Patient: Home in [place], [place] Provider: Clinic in [place], [place]   I discussed the limitations of evaluation and management by telemedicine and the availability of in person appointments. The patient expressed understanding and agreed to proceed.  History of Present Illness: Gerald Montgomery is a 68 y.o. male with PMH CP, neurogenic bladder managed with SPT and high risk hematuria with benign CTU in 2017 and cystoscopies in 2017 and 2020 who presents today for annual follow-up.  He is accompanied today by his sister, Gerald Montgomery, who contributes to HPI.  Gerald Montgomery remains his primary caregiver.  She performs his SP tube changes at home every 15 days.  He has had recurrent issues with urinary encrustation and she notices leakage around the tubing around day 12-13 that resolves with SP tube change.  She has started changing his night bag every week because the drainage will become sluggish and the bags will become malodorous.  Since I last saw them, they switched their catheter supply company to [employer].  He uses a thick strap to affix his catheter to his leg, but it does get pulled when he moves around in bed.  His wheelchair is currently broken, so he is unable to come to the hospital.  They anticipate getting this fixed soon.   Observations/Objective: No acute distress  Assessment and Plan: 1. Neurogenic bladder (Primary) Well-managed with at home SPT changes every 15 days.  She requests more night bags.  I demonstrated the use of StatLock's today and thinks these might be advantageous for her over leg straps.  I put a sample bag at the front desk for her containing several StatLock's and 16 French Silastic catheters to see if these work better for them.  I will defer  submitting an order to [employer] pending her feedback.  I anticipate her monthly order will include to 109 French Foley catheters (possibly Silastic), 2 Foley kits, 4 night bags, 4 leg straps (or possibly StatLock's), and 2 irrigation kits.  2. Abnormal urine sediment Frequent urinary encrustation, I recommended a KUB to evaluate for possible bladder stone.  They will be unable to get this done soon, and we discussed that the order will be active for about a year. - DG Abd 1 View; Future   Follow Up Instructions: Return for Please provide me feedback on StatLock's and Silastic catheters.    I discussed the assessment and treatment plan with the patient. The patient was provided an opportunity to ask questions and all were answered. The patient agreed with the plan and demonstrated an understanding of the instructions.   The patient was advised to call back or seek an in-person evaluation if the symptoms worsen or if the condition fails to improve as anticipated.  I provided 35 minutes of non-face-to-face time during this encounter.   Carman Ching, PA-C

## 2023-09-09 ENCOUNTER — Telehealth: Payer: Medicare PPO | Admitting: Urology

## 2023-09-15 ENCOUNTER — Telehealth: Payer: Self-pay | Admitting: Urology

## 2023-09-21 ENCOUNTER — Telehealth: Payer: Self-pay

## 2023-09-21 NOTE — Telephone Encounter (Signed)
 LVM for pt's sister to return call.   Placed call to verify that the pt is okay with  monthly order including two 16 French Foley catheters (possibly Silastic), 2 Foley kits, 4 night bags, 4 leg straps (or possibly StatLock's), and 2 irrigation kits.

## 2023-09-22 NOTE — Telephone Encounter (Signed)
 Pt's sister and [employer] called us  on Monday 09/20/2023 stating that we only signed 1 out of 2 pages for pt's cath order. Sam was not here on Monday to sign 2nd page. On Tuesday 09/21/2023 I asked Sam to sign but per Sam office note from most recent visit on  08/27/2023 "Return for Please provide me feedback on StatLock's and Silastic catheters." I placed call yesterday to verify cath order supplies with the sister since she hasn't called us  back to let us  know if this is working for the pt. She didn't answer so I left a LVM for her to call us  back.

## 2023-09-22 NOTE — Telephone Encounter (Signed)
 Pt's sister was informed that we needed her feedback about cath supplies before we could send [employer] a signed cath order. She confirmed that everything was fine, just needed 4 legs straps instead of 2

## 2023-09-22 NOTE — Telephone Encounter (Signed)
 Pt's sister LM on triage line very upset she states that she has been waiting for an order form to be completed by Sam to be sent to [employer]. She states that first page was sign however signature is missing on second page. Please complete.

## 2023-09-23 NOTE — Telephone Encounter (Signed)
 Documents were singed by Sigmund Drew. Documents and office noted were faxed to [employer].   I called [employer] to see if we can get a new order form with 4 leg straps instead of 2 per patient's sister request. [employer] stated that their insurance will only cover 2 leg straps and not the 4 the pt's sister wanted. Pt's sister was informed this and was informed that documents were sent to [employer].

## 2024-03-21 ENCOUNTER — Telehealth: Payer: Self-pay

## 2024-03-21 NOTE — Telephone Encounter (Unsigned)
 Copied from CRM [id number]. Topic: General - Other >> Mar 21, 2024  4:13 PM Gerald Montgomery wrote: Reason for CRM: Pt is needing a virtual visit for his medication check and he is having trouble getting a wheelchair ordered for him. Please advise #[phone] on how to proceed

## 2024-03-22 NOTE — Telephone Encounter (Signed)
 Pt caregiver notified will need to see in person

## 2024-03-28 ENCOUNTER — Ambulatory Visit: Admitting: Family Medicine

## 2024-04-04 ENCOUNTER — Ambulatory Visit: Admitting: Internal Medicine

## 2024-04-14 ENCOUNTER — Ambulatory Visit (INDEPENDENT_AMBULATORY_CARE_PROVIDER_SITE_OTHER): Admitting: Internal Medicine

## 2024-04-14 VITALS — BP 124/82 | HR 83 | Temp 98.2°F | Resp 16

## 2024-04-14 DIAGNOSIS — Z23 Encounter for immunization: Secondary | ICD-10-CM

## 2024-04-14 DIAGNOSIS — E559 Vitamin D deficiency, unspecified: Secondary | ICD-10-CM

## 2024-04-14 DIAGNOSIS — F331 Major depressive disorder, recurrent, moderate: Secondary | ICD-10-CM

## 2024-04-14 DIAGNOSIS — Z86718 Personal history of other venous thrombosis and embolism: Secondary | ICD-10-CM

## 2024-04-14 DIAGNOSIS — K219 Gastro-esophageal reflux disease without esophagitis: Secondary | ICD-10-CM

## 2024-04-14 DIAGNOSIS — Z5181 Encounter for therapeutic drug level monitoring: Secondary | ICD-10-CM

## 2024-04-14 DIAGNOSIS — Z7901 Long term (current) use of anticoagulants: Secondary | ICD-10-CM

## 2024-04-14 DIAGNOSIS — G809 Cerebral palsy, unspecified: Secondary | ICD-10-CM

## 2024-04-14 DIAGNOSIS — Z862 Personal history of diseases of the blood and blood-forming organs and certain disorders involving the immune mechanism: Secondary | ICD-10-CM

## 2024-04-14 DIAGNOSIS — E785 Hyperlipidemia, unspecified: Secondary | ICD-10-CM | POA: Diagnosis not present

## 2024-04-14 DIAGNOSIS — N319 Neuromuscular dysfunction of bladder, unspecified: Secondary | ICD-10-CM

## 2024-04-14 MED ORDER — RIVAROXABAN 10 MG PO TABS
10.0000 mg | ORAL_TABLET | Freq: Every day | ORAL | 1 refills | Status: AC
Start: 2024-04-14 — End: ?

## 2024-04-14 MED ORDER — CITALOPRAM HYDROBROMIDE 20 MG PO TABS
20.0000 mg | ORAL_TABLET | Freq: Every day | ORAL | 1 refills | Status: AC
Start: 2024-04-14 — End: ?

## 2024-04-14 MED ORDER — ARIPIPRAZOLE 15 MG PO TABS
15.0000 mg | ORAL_TABLET | Freq: Every day | ORAL | 1 refills | Status: AC
Start: 2024-04-14 — End: ?

## 2024-04-14 MED ORDER — PANTOPRAZOLE SODIUM 20 MG PO TBEC
20.0000 mg | DELAYED_RELEASE_TABLET | Freq: Every day | ORAL | 1 refills | Status: AC | PRN
Start: 2024-04-14 — End: ?

## 2024-04-14 MED ORDER — GABAPENTIN 100 MG PO CAPS
100.0000 mg | ORAL_CAPSULE | Freq: Every day | ORAL | 1 refills | Status: AC
Start: 2024-04-14 — End: ?

## 2024-04-14 NOTE — Progress Notes (Signed)
 Established Patient Office Visit  Subjective   Patient ID: Gerald Montgomery, male    DOB: [birth year]  Age: 68 y.o. MRN: [id number]  Chief Complaint  Patient presents with   Medical Management of Chronic Issues    HPI  Patient is here for follow up on chronic medical conditions. This is my first time meeting him. He is here with his sister.   Discussed the use of AI scribe software for clinical note transcription with the patient, who gave verbal consent to proceed.  History of Present Illness Gerald Montgomery is a 68 year old male with cerebral palsy who presents for a routine follow-up and medication review. He is accompanied by his sister, who is also his primary caregiver.  He is not taking methocarbamol for cerebral palsy as it was ineffective but is on gabapentin  100 mg at bedtime for leg pain. He was switched from Coumadin  to Xarelto  for a previous blood clot in his left leg and has no abnormal bleeding. He takes rosuvastatin  for cholesterol management. For mood disorder, he is on Celexa  and Abilify , which stabilize his mood. He has a suprapubic catheter and takes oxybutynin  for bladder management. He uses pantoprazole  as needed for acid reflux and occasionally requires Miralax  and enemas for constipation. He takes iron supplements and vitamin D3 daily.   Cerebral Palsy: -Currently on Gabapentin  100 mg at bedtime, had been on Robaxin but had no benefit.   HLD: -Medications: Crestor  5 mg -Patient is compliant with above medications and reports no side effects.  -Last lipid panel: Lipid Panel     Component Value Date/Time   CHOL 121 03/26/2022 1451   TRIG 65 03/26/2022 1451   HDL 44 03/26/2022 1451   CHOLHDL 2.8 03/26/2022 1451   VLDL 22 03/04/2016 1112   LDLCALC 63 03/26/2022 1451   Hx of DVT: -Years ago, one blood clot, had been on Warfarin but transitioned to Xarelto   -Currently on Xarelto  10 mg, denies side effects, no abnormal bleeding  MDD: -Currently on Celexa  20  mg, Abilify  15 mg, moods stable and denies side effects  Neurogenic Bladder: -Using suprapubic catheter  -On Oxybutynin  5 mg BID -Following with Urology   GERD: -Currently on Protonix  20 mg PRN  Hx of Iron Deficiency/Vitamin D  Deficiency:  -Ferrous Sulfate once daily and Vitamin D  OTC daily  Health Maintenance: -Blood work due -Flu and Prevnar 20 given today  Patient Active Problem List   Diagnosis Date Noted   Polyp of sigmoid colon 09/16/2022   Bedbound 07/22/2022   Patient is full code 03/19/2022   Requires continuous supervision for activities of daily living (ADL) 03/19/2022   Current moderate episode of major depressive disorder (HCC) 04/10/2020   Suprapubic catheter (HCC) 04/10/2020   Hyperlipidemia, unspecified 03/23/2019   Iron deficiency anemia 08/24/2017   Hypochromic microcytic anemia 08/03/2017   GERD (gastroesophageal reflux disease) 08/03/2017   Cerebral palsy (HCC) 12/05/2015   History of DVT of lower extremity 04/11/2015   Mood disorder 04/11/2015   Neurogenic bladder 04/11/2015   Monitoring for long-term anticoagulant use 04/11/2015   Past Medical History:  Diagnosis Date   Cerebral palsy (HCC)    Depression    GERD (gastroesophageal reflux disease)    History of DVT of lower extremity    Mood disorder    Neurogenic bladder    Urinary incontinence    Past Surgical History:  Procedure Laterality Date   COLONOSCOPY WITH PROPOFOL  N/A 12/27/2017   Procedure: COLONOSCOPY WITH PROPOFOL ;  Surgeon:  Unk Corinn Skiff, MD;  Location: [facility] ENDOSCOPY;  Service: Gastroenterology;  Laterality: N/A;   COLONOSCOPY WITH PROPOFOL  N/A 09/16/2022   Procedure: COLONOSCOPY WITH PROPOFOL ;  Surgeon: Unk Corinn Skiff, MD;  Location: [facility] ENDOSCOPY;  Service: Gastroenterology;  Laterality: N/A;  SISTER NEEDS 48 HOUR NOTICE FOR HER BROTHER - HE ONLY TRAVELS BY VAN - LATER AM IS BETTER   ESOPHAGOGASTRODUODENOSCOPY Left 02/07/2017   Procedure: ESOPHAGOGASTRODUODENOSCOPY  (EGD);  Surgeon: Eda Iha, MD;  Location[facility] END[facility]COPY;  Service: Gastroenterology;  Laterality: Left;   ESOPHAGOGASTRODUODENOSCOPY (EGD) WITH PROPOFOL   12/27/2017   Procedure: ESOPHAGOGASTRODUODENOSCOPY (EGD) WITH PROPOFOL ;  Surgeon: Unk Corinn Skiff, MD;  Location: [facility] ENDOSCOPY;  Service: Gastroenterology;;   filter surgery for blood clot  2013   HIP FRACTURE SURGERY  2013   IR CATHETER TUBE CHANGE  07/07/2017   Social History   Tobacco Use   Smoking status: Never    Passive exposure: Never   Smokeless tobacco: Never  Vaping Use   Vaping status: Never Used  Substance Use Topics   Alcohol use: No    Alcohol/week: 0.0 standard drinks of alcohol   Drug use: No   Social History   Socioeconomic History   Marital status: Single    Spouse name: Not on file   Number of children: Not on file   Years of education: Not on file   Highest education level: Not on file  Occupational History   Occupation: disabled  Tobacco Use   Smoking status: Never    Passive exposure: Never   Smokeless tobacco: Never  Vaping Use   Vaping status: Never Used  Substance and Sexual Activity   Alcohol use: No    Alcohol/week: 0.0 standard drinks of alcohol   Drug use: No   Sexual activity: Never  Other Topics Concern   Not on file  Social History Narrative   Not on file   Social Drivers of Health   Financial Resource Strain: Low Risk  (02/26/2022)   Overall Financial Resource Strain (CARDIA)    Difficulty of Paying Living Expenses: Not hard at all  Food Insecurity: No Food Insecurity (02/26/2022)   Hunger Vital Sign    Worried About Running Out of Food in the Last Year: Never true    Ran Out of Food in the Last Year: Never true  Transportation Needs: No Transportation Needs (02/26/2022)   PRAPARE - Administrator, Civil Service (Medical): No    Lack of Transportation (Non-Medical): No  Physical Activity: Inactive (02/26/2022)   Exercise Vital Sign    Days of Exercise  per Week: 0 days    Minutes of Exercise per Session: 0 min  Stress: No Stress Concern Present (02/26/2022)   [employer] of Occupational Health - Occupational Stress Questionnaire    Feeling of Stress : Not at all  Social Connections: Socially Isolated (02/26/2022)   Social Connection and Isolation Panel    Frequency of Communication with Friends and Family: More than three times a week    Frequency of Social Gatherings with Friends and Family: More than three times a week    Attends Religious Services: Never    Database Administrator or Organizations: No    Attends Banker Meetings: Never    Marital Status: Widowed  Intimate Partner Violence: Not At Risk (02/26/2022)   Humiliation, Afraid, Rape, and Kick questionnaire    Fear of Current or Ex-Partner: No    Emotionally Abused: No    Physically Abused: No  Sexually Abused: No   Family Status  Relation Name Status   Mother  Alive   Father  Deceased   Neg Hx  (Not Specified)  No partnership data on file   Family History  Problem Relation Age of Onset   Heart disease Mother    Atrial fibrillation Mother    Diabetes Mother        Borderline   Heart disease Father        Passed away of heart attack   Kidney disease Neg Hx    Prostate cancer Neg Hx    Allergies  Allergen Reactions   Macrobid  [Nitrofurantoin ]    Penicillins     Sickness/sweating   Sulfa  Antibiotics Itching      Review of Systems  All other systems reviewed and are negative.     Objective:     BP 124/82 (Cuff Size: Large)   Pulse 83   Temp 98.2 F (36.8 C) (Oral)   Resp 16   SpO2 97%  BP Readings from Last 3 Encounters:  04/14/24 124/82  09/16/22 (!) 144/80  08/31/22 (!) 168/130   Wt Readings from Last 3 Encounters:  09/16/22 163 lb (73.9 kg)  03/26/22 167 lb 9.6 oz (76 kg)  02/22/20 118 lb (53.5 kg)      Physical Exam Constitutional:      Appearance: Normal appearance.     Comments: Presents in wheelchair  HENT:      Head: Normocephalic and atraumatic.     Mouth/Throat:     Mouth: Mucous membranes are moist.     Pharynx: Oropharynx is clear.  Eyes:     Extraocular Movements: Extraocular movements intact.     Conjunctiva/sclera: Conjunctivae normal.     Pupils: Pupils are equal, round, and reactive to light.  Neck:     Comments: No thyromegaly Cardiovascular:     Rate and Rhythm: Normal rate and regular rhythm.  Pulmonary:     Effort: Pulmonary effort is normal.     Breath sounds: Normal breath sounds.  Musculoskeletal:     Cervical back: No tenderness.     Right lower leg: No edema.     Left lower leg: No edema.  Lymphadenopathy:     Cervical: No cervical adenopathy.  Skin:    General: Skin is warm and dry.  Neurological:     Mental Status: He is alert. Mental status is at baseline.     Comments: Contractures in bilateral hands  Psychiatric:        Mood and Affect: Mood normal.        Behavior: Behavior normal.      No results found for any visits on 04/14/24.  Last CBC Lab Results  Component Value Date   WBC 7.1 05/05/2022   HGB 10.9 (A) 05/05/2022   HCT 36 (A) 05/05/2022   MCV 74.9 (L) 03/26/2022   MCH 23.6 (L) 03/26/2022   RDW 16.6 (H) 03/26/2022   PLT 195 05/05/2022   Last metabolic panel Lab Results  Component Value Date   GLUCOSE 87 03/26/2022   NA 140 03/26/2022   K 4.3 03/26/2022   CL 108 03/26/2022   CO2 23 03/26/2022   BUN 19 03/26/2022   CREATININE 0.67 (L) 03/26/2022   EGFR 104 03/26/2022   CALCIUM  8.5 (L) 03/26/2022   PROT 6.5 03/26/2022   ALBUMIN 4.4 08/24/2017   LABGLOB 2.6 10/07/2015   AGRATIO 1.6 10/07/2015   BILITOT 0.4 03/26/2022   ALKPHOS 86 08/24/2017   AST  10 03/26/2022   ALT 14 03/26/2022   ANIONGAP 10 08/24/2017   Last lipids Lab Results  Component Value Date   CHOL 121 03/26/2022   HDL 44 03/26/2022   LDLCALC 63 03/26/2022   TRIG 65 03/26/2022   CHOLHDL 2.8 03/26/2022   Last hemoglobin A1c Lab Results  Component Value  Date   HGBA1C 5.2 03/04/2016   Last thyroid  functions Lab Results  Component Value Date   TSH 1.39 10/22/2020   Last vitamin D  No results found for: 25OHVITD2, 25OHVITD3, VD25OH Last vitamin B12 and Folate Lab Results  Component Value Date   VITAMINB12 280 02/25/2018   FOLATE 17.1 08/24/2017      The ASCVD Risk score (Arnett DK, et al., 2019) failed to calculate for the following reasons:   The valid total cholesterol range is 130 to 320 mg/dL    Assessment & Plan:   Assessment & Plan Cerebral palsy with contractures Chronic condition with contractures. Gabapentin  effective for leg pain. - Continue gabapentin  100 mg at bedtime. - Ordered new wheelchair with specifications for comfort and support.  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 No current bleeding or clotting issues. Future discussion of anticoagulation risks and benefits planned. - Continue Xarelto .  Hyperlipidemia Managed with rosuvastatin . Current dose low; labs will determine need for increase. - Ordered lipid panel. - Will refill rosuvastatin  with potential dose increase based on lab results.  Gastroesophageal reflux disease Intermittent symptoms managed with pantoprazole  as needed. - Refilled pantoprazole  for as-needed use.  Neurogenic bladder with suprapubic catheter Managed by urology. Dietary changes reduced urine sediment. - Continue current management with urology.  Major depressive disorder Managed with Celexa  and Abilify . Medications effective. - Continue Celexa  and Abilify .  General Health Maintenance Flu vaccine administered. Pneumonia vaccine due. - Administered pneumonia vaccine.  - For home use only DME Other see comment - gabapentin  (NEURONTIN ) 100 MG capsule; Take 1 capsule (100 mg total) by mouth at bedtime.  Dispense: 90 capsule; Refill: 1 - ARIPiprazole  (ABILIFY ) 15 MG tablet; Take 1 tablet (15 mg total)  by mouth daily.  Dispense: 90 tablet; Refill: 1 - citalopram  (CELEXA ) 20 MG tablet; Take 1 tablet (20 mg total) by mouth daily.  Dispense: 90 tablet; Refill: 1 - Lipid Profile - Fe+TIBC+Fer - CBC w/Diff/Platelet - Comprehensive Metabolic Panel (CMET) - pantoprazole  (PROTONIX ) 20 MG tablet; Take 1 tablet (20 mg total) by mouth daily as needed for heartburn or indigestion.  Dispense: 90 tablet; Refill: 1 - Flu vaccine HIGH DOSE PF(Fluzone Trivalent) - rivaroxaban  (XARELTO ) 10 MG TABS tablet; Take 1 tablet (10 mg total) by mouth daily.  Dispense: 90 tablet; Refill: 1 - Pneumococcal conjugate vaccine 20-valent (Prevnar 20) - Vitamin D  (25 hydroxy)   Return in about 6 months (around 10/12/2024) for virtual .    Sharyle Fischer, DO

## 2024-04-15 ENCOUNTER — Ambulatory Visit: Payer: Self-pay | Admitting: Internal Medicine

## 2024-04-15 DIAGNOSIS — R350 Frequency of micturition: Secondary | ICD-10-CM | POA: Diagnosis not present

## 2024-04-15 DIAGNOSIS — D72829 Elevated white blood cell count, unspecified: Secondary | ICD-10-CM

## 2024-04-15 DIAGNOSIS — E7800 Pure hypercholesterolemia, unspecified: Secondary | ICD-10-CM

## 2024-04-15 LAB — COMPREHENSIVE METABOLIC PANEL WITH GFR
AG Ratio: 1.8 (calc) (ref 1.0–2.5)
ALT: 26 U/L (ref 9–46)
AST: 18 U/L (ref 10–35)
Albumin: 4.4 g/dL (ref 3.6–5.1)
Alkaline phosphatase (APISO): 91 U/L (ref 35–144)
BUN/Creatinine Ratio: 33 (calc) — ABNORMAL HIGH (ref 6–22)
BUN: 16 mg/dL (ref 7–25)
CO2: 22 mmol/L (ref 20–32)
Calcium: 9 mg/dL (ref 8.6–10.3)
Chloride: 104 mmol/L (ref 98–110)
Creat: 0.48 mg/dL — ABNORMAL LOW (ref 0.70–1.35)
Globulin: 2.5 g/dL (ref 1.9–3.7)
Glucose, Bld: 97 mg/dL (ref 65–139)
Potassium: 4 mmol/L (ref 3.5–5.3)
Sodium: 137 mmol/L (ref 135–146)
Total Bilirubin: 0.6 mg/dL (ref 0.2–1.2)
Total Protein: 6.9 g/dL (ref 6.1–8.1)
eGFR: 113 mL/min/1.73m2 (ref >=60)

## 2024-04-15 LAB — LIPID PANEL
Cholesterol: 131 mg/dL (ref <200)
HDL: 41 mg/dL (ref >=40)
LDL Cholesterol (Calc): 75 mg/dL
Non-HDL Cholesterol (Calc): 90 mg/dL (ref <130)
Total CHOL/HDL Ratio: 3.2 (calc) (ref <5.0)
Triglycerides: 74 mg/dL (ref <150)

## 2024-04-15 LAB — CBC WITH DIFFERENTIAL/PLATELET
Absolute Lymphocytes: 948 {cells}/uL (ref 850–3900)
Absolute Monocytes: 567 {cells}/uL (ref 200–950)
Basophils Absolute: 55 {cells}/uL (ref 0–200)
Basophils Relative: 0.5 %
Eosinophils Absolute: 33 {cells}/uL (ref 15–500)
Eosinophils Relative: 0.3 %
HCT: 44 % (ref 38.5–50.0)
Hemoglobin: 14.6 g/dL (ref 13.2–17.1)
MCH: 30.2 pg (ref 27.0–33.0)
MCHC: 33.2 g/dL (ref 32.0–36.0)
MCV: 91.1 fL (ref 80.0–100.0)
MPV: 12.1 fL (ref 7.5–12.5)
Monocytes Relative: 5.2 %
Neutro Abs: 9298 {cells}/uL — ABNORMAL HIGH (ref 1500–7800)
Neutrophils Relative %: 85.3 %
Platelets: 163 Thousand/uL (ref 140–400)
RBC: 4.83 Million/uL (ref 4.20–5.80)
RDW: 12.9 % (ref 11.0–15.0)
Total Lymphocyte: 8.7 %
WBC: 10.9 Thousand/uL — ABNORMAL HIGH (ref 3.8–10.8)

## 2024-04-15 LAB — IRON,TIBC AND FERRITIN PANEL
%SAT: 22 % (ref 20–48)
Ferritin: 44 ng/mL (ref 24–380)
Iron: 77 ug/dL (ref 50–180)
TIBC: 355 ug/dL (ref 250–425)

## 2024-04-15 LAB — VITAMIN D 25 HYDROXY (VIT D DEFICIENCY, FRACTURES): Vit D, 25-Hydroxy: 44 ng/mL (ref 30–100)

## 2024-04-15 MED ORDER — ROSUVASTATIN CALCIUM 5 MG PO TABS
5.0000 mg | ORAL_TABLET | Freq: Every day | ORAL | 3 refills | Status: AC
Start: 2024-04-15 — End: ?

## 2024-04-17 ENCOUNTER — Other Ambulatory Visit: Payer: Self-pay | Admitting: Internal Medicine

## 2024-04-17 DIAGNOSIS — N3090 Cystitis, unspecified without hematuria: Secondary | ICD-10-CM

## 2024-04-17 LAB — POCT URINALYSIS DIPSTICK
Bilirubin, UA: NEGATIVE
Blood, UA: POSITIVE
Glucose, UA: NEGATIVE
Ketones, UA: NEGATIVE
Nitrite, UA: POSITIVE
Odor: NORMAL
Protein, UA: NEGATIVE
Spec Grav, UA: 1.02 (ref 1.010–1.025)
Urobilinogen, UA: 0.2 U/dL
pH, UA: 7 (ref 5.0–8.0)

## 2024-04-17 MED ORDER — CIPROFLOXACIN HCL 250 MG PO TABS
250.0000 mg | ORAL_TABLET | Freq: Two times a day (BID) | ORAL | 0 refills | Status: AC
Start: 2024-04-17 — End: 2024-04-22

## 2024-04-17 NOTE — Addendum Note (Signed)
 Addended by: YVONE WARREN BROCKS on: 04/17/2024 02:47 PM   Modules accepted: Orders

## 2024-04-18 LAB — URINE CULTURE
MICRO NUMBER:: 17277087
SPECIMEN QUALITY:: ADEQUATE

## 2024-04-25 ENCOUNTER — Telehealth: Payer: Self-pay

## 2024-04-25 DIAGNOSIS — G809 Cerebral palsy, unspecified: Secondary | ICD-10-CM

## 2024-04-25 NOTE — Telephone Encounter (Signed)
 Copied from CRM #[id number]. Topic: General - Other >> Apr 25, 2024 11:46 AM Wess RAMAN wrote: Reason for CRM: Patient's sister, Lorren Railing, stated [employer] Needs PCP to send out a therapist so that patient can get accessed for a wheelchair  Maryetta Rushing #: [phone] [employer] Fax #: [phone] [employer] Callback #: [phone]

## 2024-05-04 ENCOUNTER — Telehealth: Payer: Self-pay

## 2024-05-04 NOTE — Telephone Encounter (Signed)
 Left detailed vm sent before and got confirmation, but resent again.

## 2024-05-04 NOTE — Telephone Encounter (Signed)
 Heron calling back stating the fax # is incorrect. Fax needs to be sent to [employer] at [phone].

## 2024-05-04 NOTE — Telephone Encounter (Signed)
 Copied from CRM #[id number]. Topic: Clinical - Order For Equipment >> May 04, 2024  9:48 AM Tinnie BROCKS wrote: Reason for CRM: Heron (Sister on [facility]) calling to get update on order for new motion wheelchair from a few weeks ago. [employer] told her they had not received order. She says she does not have a fax # for them, but if we need to contact them, their number is [phone]. I found fax # Fax  [phone] online for them. Barbara's number is the main number on file for pt, please reach out to her to update her on progress.

## 2024-05-04 NOTE — Telephone Encounter (Signed)
 Refaxed with the number updated

## 2024-05-16 ENCOUNTER — Ambulatory Visit

## 2024-05-16 VITALS — Wt 163.0 lb

## 2024-05-16 DIAGNOSIS — Z0000 Encounter for general adult medical examination without abnormal findings: Secondary | ICD-10-CM | POA: Diagnosis not present

## 2024-05-16 NOTE — Progress Notes (Signed)
 "   Chief Complaint  Patient presents with   Medicare Wellness     Subjective:   Gerald Montgomery is a 68 y.o. male who presents for a Medicare Annual Wellness Visit.  Visit info / Clinical Intake: Medicare Wellness Visit Type:: Initial Annual Wellness Visit Persons participating in visit and providing information:: caregiver (with patient present) (sister is his caregiver) Medicare Wellness Visit Mode:: Video Since this visit was completed virtually, some vitals may be partially provided or unavailable. Missing vitals are due to the limitations of the virtual format.: Unable to obtain vitals - no equipment If Telephone or Video please confirm:: I connected with patient using audio/video enable telemedicine. I verified patient identity with two identifiers, discussed telehealth limitations, and patient agreed to proceed. Patient Location:: Home Provider Location:: Home Interpreter Needed?: No Pre-visit prep was completed: yes AWV questionnaire completed by patient prior to visit?: no Living arrangements:: with family/others Patient's Overall Health Status Rating: very good Typical amount of pain: none Does pain affect daily life?: no Are you currently prescribed opioids?: no  Dietary Habits and Nutritional Risks How many meals a day?: 2 Eats fruit and vegetables daily?: yes Most meals are obtained by: preparing own meals In the last 2 weeks, have you had any of the following?: none Diabetic:: no  Functional Status Activities of Daily Living (to include ambulation/medication): (!) Dependent Feeding: Needs assistance Dressing/Grooming: Needs assistance Bathing: Needs assistance Toileting: Needs assistance Transfer: Needs assistance Ambulation: Dependent Home Assistive Devices/Equipment: Wheelchair Medication Administration: Dependent Home Management (perform basic housework or laundry): Dependent Manage your own finances?: (!) no (sister is his caregiver) Primary  transportation is: family / friends (sister drives) Concerns about vision?: no *vision screening is required for WTM* Concerns about hearing?: no  Fall Screening Falls in the past year?: 0 Number of falls in past year: 0 Was there an injury with Fall?: 0 Fall Risk Category Calculator: 0 Patient Fall Risk Level: Low Fall Risk  Fall Risk Patient at Risk for Falls Due to: Impaired mobility Fall risk Follow up: Falls evaluation completed; Falls prevention discussed  Home and Transportation Safety: All rugs have non-skid backing?: N/A, no rugs All stairs or steps have railings?: N/A, no stairs Grab bars in the bathtub or shower?: yes Have non-skid surface in bathtub or shower?: yes Good home lighting?: yes Regular seat belt use?: yes Hospital stays in the last year:: no  Cognitive Assessment Difficulty concentrating, remembering, or making decisions? : yes Will 6CIT or Mini Cog be Completed: no 6CIT or Mini Cog Declined: patient has a diagnosis of dementia or cognitive impairment  Advance Directives (For Healthcare) Does Patient Have a Medical Advance Directive?: Yes Type of Advance Directive: Healthcare Power of [place]; Living will Copy of Healthcare Power of Attorney in Chart?: No - copy requested Copy of Living Will in Chart?: No - copy requested  Reviewed/Updated  Reviewed/Updated: Reviewed All (Medical, Surgical, Family, Medications, Allergies, Care Teams, Patient Goals)    Allergies (verified) Macrobid  [nitrofurantoin ], Penicillins, and Sulfa  antibiotics   Current Medications (verified) Outpatient Encounter Medications as of 05/16/2024  Medication Sig   acetaminophen  (TYLENOL ) 650 MG CR tablet Take 650 mg by mouth every 8 (eight) hours as needed for pain.   ARIPiprazole  (ABILIFY ) 15 MG tablet Take 1 tablet (15 mg total) by mouth daily.   citalopram  (CELEXA ) 20 MG tablet Take 1 tablet (20 mg total) by mouth daily.   fluticasone (FLONASE) 50 MCG/ACT nasal spray Place  into both nostrils daily.   gabapentin  (NEURONTIN )  100 MG capsule Take 1 capsule (100 mg total) by mouth at bedtime.   IRON CR PO Take by mouth.   oxybutynin  (DITROPAN ) 5 MG tablet Take 1 tablet (5 mg total) by mouth 2 (two) times daily.   pantoprazole  (PROTONIX ) 40 MG tablet Take 40 mg by mouth daily.   rivaroxaban  (XARELTO ) 10 MG TABS tablet Take 1 tablet (10 mg total) by mouth daily.   rosuvastatin  (CRESTOR ) 5 MG tablet Take 1 tablet (5 mg total) by mouth daily.   pantoprazole  (PROTONIX ) 20 MG tablet Take 1 tablet (20 mg total) by mouth daily as needed for heartburn or indigestion. (Patient not taking: Reported on 05/16/2024)   Facility-Administered Encounter Medications as of 05/16/2024  Medication   lidocaine  (XYLOCAINE ) 2 % jelly 1 application    History: Past Medical History:  Diagnosis Date   Cerebral palsy (HCC)    Depression    GERD (gastroesophageal reflux disease)    History of DVT of lower extremity    Mood disorder    Neurogenic bladder    Urinary incontinence    Past Surgical History:  Procedure Laterality Date   COLONOSCOPY WITH PROPOFOL  N/A 12/27/2017   Procedure: COLONOSCOPY WITH PROPOFOL ;  Surgeon: Unk Corinn Skiff, MD;  Location: [facility] ENDOSCOPY;  Service: Gastroenterology;  Laterality: N/A;   COLONOSCOPY WITH PROPOFOL  N/A 09/16/2022   Procedure: COLONOSCOPY WITH PROPOFOL ;  Surgeon: Unk Corinn Skiff, MD;  Location: [facility] ENDOSCOPY;  Service: Gastroenterology;  Laterality: N/A;  SISTER NEEDS 48 HOUR NOTICE FOR HER BROTHER - HE ONLY TRAVELS BY VAN - LATER AM IS BETTER   ESOPHAGOGASTRODUODENOSCOPY Left 02/07/2017   Procedure: ESOPHAGOGASTRODUODENOSCOPY (EGD);  Surgeon: Eda Iha, MD;  Location[facility] ENDOSCOPY;  Service: Gastroenterology;  Laterality: Left;   ESOPHAGOGASTRODUODENOSCOPY (EGD) WITH PROPOFOL   12/27/2017   Procedure: ESOPHAGOGASTRODUODENOSCOPY (EGD) WITH PROPOFOL ;  Surgeon: Unk Corinn Skiff, MD;  Location: [facility] ENDOSCOPY;  Service:  Gastroenterology;;   filter surgery for blood clot  2013   HIP FRACTURE SURGERY  2013   IR CATHETER TUBE CHANGE  07/07/2017   Family History  Problem Relation Age of Onset   Heart disease Mother    Atrial fibrillation Mother    Diabetes Mother        Borderline   Heart disease Father        Passed away of heart attack   Kidney disease Neg Hx    Prostate cancer Neg Hx    Social History   Occupational History   Occupation: disabled  Tobacco Use   Smoking status: Never    Passive exposure: Never   Smokeless tobacco: Never  Vaping Use   Vaping status: Never Used  Substance and Sexual Activity   Alcohol use: No    Alcohol/week: 0.0 standard drinks of alcohol   Drug use: No   Sexual activity: Never   Tobacco Counseling Counseling given: Not Answered  SDOH Screenings   Food Insecurity: No Food Insecurity (05/16/2024)  Housing: Unknown (05/16/2024)  Transportation Needs: No Transportation Needs (05/16/2024)  Utilities: Not At Risk (05/16/2024)  Alcohol Screen: Low Risk (04/14/2024)  Depression (PHQ2-9): Low Risk (05/16/2024)  Financial Resource Strain: Low Risk (02/26/2022)  Physical Activity: Inactive (05/16/2024)  Social Connections: Socially Isolated (05/16/2024)  Stress: Patient Unable To Answer (05/16/2024)  Tobacco Use: Low Risk (05/16/2024)  Health Literacy: Patient Unable To Answer (05/16/2024)   See flowsheets for full screening details  Depression Screen Depression Screening Exception Documentation Depression Screening Exception:: Other- indicate reason in comment box Depression Screening Exception Comment:: Sister helped  with answers for PHQ 2&9  PHQ 2 & 9 Depression Scale- Over the past 2 weeks, how often have you been bothered by any of the following problems? Little interest or pleasure in doing things: 0 Feeling down, depressed, or hopeless (PHQ Adolescent also includes...irritable): 0 PHQ-2 Total Score: 0 Trouble falling or staying asleep, or  sleeping too much: 0 Feeling tired or having little energy: 0 Poor appetite or overeating (PHQ Adolescent also includes...weight loss): 0 Feeling bad about yourself - or that you are a failure or have let yourself or your family down: 0 Trouble concentrating on things, such as reading the newspaper or watching television (PHQ Adolescent also includes...like school work): 0 Moving or speaking so slowly that other people could have noticed. Or the opposite - being so fidgety or restless that you have been moving around a lot more than usual: 0 Thoughts that you would be better off dead, or of hurting yourself in some way: 0 PHQ-9 Total Score: 0 If you checked off any problems, how difficult have these problems made it for you to do your work, take care of things at home, or get along with other people?: Not difficult at all  Depression Treatment Depression Interventions/Treatment : [id number] Score <4 Follow-up Not Indicated     Goals Addressed   None          Objective:    Today's Vitals   05/16/24 1050  Weight: 163 lb (73.9 kg)   Body mass index is 24.07 kg/m.  Hearing/Vision screen Hearing Screening - Comments:: Denies hearing difficulties   Vision Screening - Comments:: Denies vision issues./Not UTD/No eye doctor  Immunizations and Health Maintenance Health Maintenance  Topic Date Due   DTaP/Tdap/Td (1 - Tdap) Never done   Medicare Annual Wellness (AWV)  05/16/2025   Colonoscopy  09/15/2032   Pneumococcal Vaccine: 50+ Years  Completed   Influenza Vaccine  Completed   Hepatitis C Screening  Completed   Meningococcal B Vaccine  Aged Out   COVID-19 Vaccine  Discontinued   Zoster Vaccines- Shingrix  Discontinued        Assessment/Plan:  This is a routine wellness examination for Kristapher.  Patient Care Team: Bernardo Fend, DO as PCP - General (Internal Medicine) Chauncey Redell Agent, MD as Consulting Physician (Urology)  I have personally reviewed and noted the  following in the patients chart:   Medical and social history Use of alcohol, tobacco or illicit drugs  Current medications and supplements including opioid prescriptions. Functional ability and status Nutritional status Physical activity Advanced directives List of other physicians Hospitalizations, surgeries, and ER visits in previous 12 months Vitals Screenings to include cognitive, depression, and falls Referrals and appointments  No orders of the defined types were placed in this encounter.  In addition, I have reviewed and discussed with patient certain preventive protocols, quality metrics, and best practice recommendations. A written personalized care plan for preventive services as well as general preventive health recommendations were provided to patient.   Johnluke Haugen L Maureen Duesing, CMA   05/16/2024   Return in 1 year (on 05/16/2025).  After Visit Summary: (MyChart) Due to this being a telephonic visit, the after visit summary with patients personalized plan was offered to patient via MyChart   Nurse Notes: No voiced or noted concerns at this time "

## 2024-10-12 ENCOUNTER — Ambulatory Visit: Admitting: Internal Medicine
